# Patient Record
Sex: Female | Born: 1940 | Race: White | Hispanic: No | Smoking: Never smoker
Health system: Southern US, Community
[De-identification: ages and names within clinical notes are randomized; demographics above are authoritative.]

## PROBLEM LIST (undated history)

## (undated) DIAGNOSIS — G20A1 Parkinson's disease without dyskinesia, without mention of fluctuations: Secondary | ICD-10-CM

## (undated) DIAGNOSIS — E785 Hyperlipidemia, unspecified: Secondary | ICD-10-CM

## (undated) DIAGNOSIS — G20 Parkinson's disease: Secondary | ICD-10-CM

## (undated) DIAGNOSIS — I10 Essential (primary) hypertension: Secondary | ICD-10-CM

## (undated) DIAGNOSIS — N200 Calculus of kidney: Secondary | ICD-10-CM

## (undated) DIAGNOSIS — M1990 Unspecified osteoarthritis, unspecified site: Secondary | ICD-10-CM

## (undated) DIAGNOSIS — F419 Anxiety disorder, unspecified: Secondary | ICD-10-CM

## (undated) DIAGNOSIS — Z87442 Personal history of urinary calculi: Secondary | ICD-10-CM

## (undated) DIAGNOSIS — F32A Depression, unspecified: Secondary | ICD-10-CM

## (undated) DIAGNOSIS — H409 Unspecified glaucoma: Secondary | ICD-10-CM

## (undated) DIAGNOSIS — F329 Major depressive disorder, single episode, unspecified: Secondary | ICD-10-CM

## (undated) HISTORY — DX: Depression, unspecified: F32.A

## (undated) HISTORY — DX: Hyperlipidemia, unspecified: E78.5

## (undated) HISTORY — DX: Unspecified glaucoma: H40.9

## (undated) HISTORY — PX: ABDOMINAL HYSTERECTOMY: SHX81

## (undated) HISTORY — PX: CATARACT EXTRACTION, BILATERAL: SHX1313

## (undated) HISTORY — DX: Major depressive disorder, single episode, unspecified: F32.9

## (undated) HISTORY — PX: OTHER SURGICAL HISTORY: SHX169

## (undated) HISTORY — DX: Calculus of kidney: N20.0

## (undated) HISTORY — PX: ANTERIOR (CYSTOCELE) AND POSTERIOR REPAIR (RECTOCELE) WITH XENFORM GRAFT AND SACROSPINOUS FIXATION: SHX6492

## (undated) HISTORY — DX: Essential (primary) hypertension: I10

## (undated) HISTORY — PX: INGUINAL HERNIA REPAIR: SUR1180

## (undated) HISTORY — DX: Anxiety disorder, unspecified: F41.9

---

## 2005-04-19 ENCOUNTER — Encounter: Admission: RE | Admit: 2005-04-19 | Discharge: 2005-04-19 | Payer: Self-pay | Admitting: Internal Medicine

## 2006-10-21 ENCOUNTER — Ambulatory Visit (HOSPITAL_COMMUNITY): Admission: RE | Admit: 2006-10-21 | Discharge: 2006-10-21 | Payer: Self-pay | Admitting: Urology

## 2006-10-27 ENCOUNTER — Ambulatory Visit (HOSPITAL_COMMUNITY): Admission: RE | Admit: 2006-10-27 | Discharge: 2006-10-27 | Payer: Self-pay | Admitting: Urology

## 2007-04-02 ENCOUNTER — Ambulatory Visit (HOSPITAL_COMMUNITY): Admission: RE | Admit: 2007-04-02 | Discharge: 2007-04-02 | Payer: Self-pay | Admitting: Urology

## 2016-01-11 DIAGNOSIS — Z800 Family history of malignant neoplasm of digestive organs: Secondary | ICD-10-CM | POA: Diagnosis not present

## 2016-01-11 DIAGNOSIS — Z1231 Encounter for screening mammogram for malignant neoplasm of breast: Secondary | ICD-10-CM | POA: Diagnosis not present

## 2016-01-11 LAB — HM MAMMOGRAPHY

## 2016-01-17 DIAGNOSIS — L218 Other seborrheic dermatitis: Secondary | ICD-10-CM | POA: Diagnosis not present

## 2016-01-17 DIAGNOSIS — L570 Actinic keratosis: Secondary | ICD-10-CM | POA: Diagnosis not present

## 2016-01-30 DIAGNOSIS — Z1212 Encounter for screening for malignant neoplasm of rectum: Secondary | ICD-10-CM | POA: Diagnosis not present

## 2016-01-30 DIAGNOSIS — Z124 Encounter for screening for malignant neoplasm of cervix: Secondary | ICD-10-CM | POA: Diagnosis not present

## 2016-02-14 DIAGNOSIS — E2839 Other primary ovarian failure: Secondary | ICD-10-CM | POA: Diagnosis not present

## 2016-02-19 LAB — HEPATIC FUNCTION PANEL
ALT: 12 U/L (ref 7–35)
AST: 13 U/L (ref 13–35)
Alkaline Phosphatase: 70 U/L (ref 25–125)

## 2016-02-19 LAB — BASIC METABOLIC PANEL
BUN: 16 mg/dL (ref 4–21)
CREATININE: 0.7 mg/dL (ref 0.5–1.1)
GLUCOSE: 105 mg/dL
POTASSIUM: 4.2 mmol/L (ref 3.4–5.3)
Sodium: 141 mmol/L (ref 137–147)

## 2016-02-19 LAB — LIPID PANEL
CHOLESTEROL: 180 mg/dL (ref 0–200)
HDL: 54 mg/dL (ref 35–70)
LDL Cholesterol: 99 mg/dL
Triglycerides: 135 mg/dL (ref 40–160)

## 2016-02-29 DIAGNOSIS — K219 Gastro-esophageal reflux disease without esophagitis: Secondary | ICD-10-CM | POA: Diagnosis not present

## 2016-02-29 DIAGNOSIS — E785 Hyperlipidemia, unspecified: Secondary | ICD-10-CM | POA: Diagnosis not present

## 2016-02-29 DIAGNOSIS — I10 Essential (primary) hypertension: Secondary | ICD-10-CM | POA: Diagnosis not present

## 2016-03-05 DIAGNOSIS — E785 Hyperlipidemia, unspecified: Secondary | ICD-10-CM | POA: Diagnosis not present

## 2016-03-05 DIAGNOSIS — M8580 Other specified disorders of bone density and structure, unspecified site: Secondary | ICD-10-CM | POA: Diagnosis not present

## 2016-03-05 DIAGNOSIS — I10 Essential (primary) hypertension: Secondary | ICD-10-CM | POA: Diagnosis not present

## 2016-03-05 DIAGNOSIS — F329 Major depressive disorder, single episode, unspecified: Secondary | ICD-10-CM | POA: Diagnosis not present

## 2016-03-11 DIAGNOSIS — L821 Other seborrheic keratosis: Secondary | ICD-10-CM | POA: Diagnosis not present

## 2016-03-11 DIAGNOSIS — L570 Actinic keratosis: Secondary | ICD-10-CM | POA: Diagnosis not present

## 2016-03-11 DIAGNOSIS — L814 Other melanin hyperpigmentation: Secondary | ICD-10-CM | POA: Diagnosis not present

## 2016-03-11 DIAGNOSIS — D229 Melanocytic nevi, unspecified: Secondary | ICD-10-CM | POA: Diagnosis not present

## 2016-03-12 DIAGNOSIS — H43813 Vitreous degeneration, bilateral: Secondary | ICD-10-CM | POA: Diagnosis not present

## 2016-03-12 DIAGNOSIS — H401134 Primary open-angle glaucoma, bilateral, indeterminate stage: Secondary | ICD-10-CM | POA: Diagnosis not present

## 2016-03-12 DIAGNOSIS — Z961 Presence of intraocular lens: Secondary | ICD-10-CM | POA: Diagnosis not present

## 2016-03-12 DIAGNOSIS — H1013 Acute atopic conjunctivitis, bilateral: Secondary | ICD-10-CM | POA: Diagnosis not present

## 2016-09-03 DIAGNOSIS — H401134 Primary open-angle glaucoma, bilateral, indeterminate stage: Secondary | ICD-10-CM | POA: Diagnosis not present

## 2016-09-03 DIAGNOSIS — Z961 Presence of intraocular lens: Secondary | ICD-10-CM | POA: Diagnosis not present

## 2016-09-03 DIAGNOSIS — H43813 Vitreous degeneration, bilateral: Secondary | ICD-10-CM | POA: Diagnosis not present

## 2016-09-03 DIAGNOSIS — H1013 Acute atopic conjunctivitis, bilateral: Secondary | ICD-10-CM | POA: Diagnosis not present

## 2016-09-16 DIAGNOSIS — M722 Plantar fascial fibromatosis: Secondary | ICD-10-CM | POA: Diagnosis not present

## 2016-09-16 DIAGNOSIS — M7751 Other enthesopathy of right foot: Secondary | ICD-10-CM | POA: Diagnosis not present

## 2016-09-17 DIAGNOSIS — Z23 Encounter for immunization: Secondary | ICD-10-CM | POA: Diagnosis not present

## 2016-09-17 DIAGNOSIS — H401134 Primary open-angle glaucoma, bilateral, indeterminate stage: Secondary | ICD-10-CM | POA: Diagnosis not present

## 2016-09-17 DIAGNOSIS — H43813 Vitreous degeneration, bilateral: Secondary | ICD-10-CM | POA: Diagnosis not present

## 2016-09-17 DIAGNOSIS — Z961 Presence of intraocular lens: Secondary | ICD-10-CM | POA: Diagnosis not present

## 2016-09-17 DIAGNOSIS — H1013 Acute atopic conjunctivitis, bilateral: Secondary | ICD-10-CM | POA: Diagnosis not present

## 2016-09-25 DIAGNOSIS — I10 Essential (primary) hypertension: Secondary | ICD-10-CM | POA: Diagnosis not present

## 2016-09-25 DIAGNOSIS — E785 Hyperlipidemia, unspecified: Secondary | ICD-10-CM | POA: Diagnosis not present

## 2016-09-25 DIAGNOSIS — K219 Gastro-esophageal reflux disease without esophagitis: Secondary | ICD-10-CM | POA: Diagnosis not present

## 2016-09-25 LAB — CBC AND DIFFERENTIAL
HCT: 42 % (ref 36–46)
Hemoglobin: 13.7 g/dL (ref 12.0–16.0)
PLATELETS: 326 10*3/uL (ref 150–399)
WBC: 6.6 10*3/mL

## 2016-09-25 LAB — BASIC METABOLIC PANEL
BUN: 15 mg/dL (ref 4–21)
CREATININE: 0.7 mg/dL (ref 0.5–1.1)
Glucose: 109 mg/dL
POTASSIUM: 4.3 mmol/L (ref 3.4–5.3)
Sodium: 142 mmol/L (ref 137–147)

## 2016-09-25 LAB — LIPID PANEL
Cholesterol: 181 mg/dL (ref 0–200)
HDL: 52 mg/dL (ref 35–70)
LDL Cholesterol: 102 mg/dL
Triglycerides: 137 mg/dL (ref 40–160)

## 2016-09-25 LAB — HEPATIC FUNCTION PANEL
ALK PHOS: 68 U/L (ref 25–125)
ALT: 9 U/L (ref 7–35)
AST: 11 U/L — AB (ref 13–35)
BILIRUBIN, TOTAL: 0.4 mg/dL

## 2016-09-27 DIAGNOSIS — E785 Hyperlipidemia, unspecified: Secondary | ICD-10-CM | POA: Diagnosis not present

## 2016-09-27 DIAGNOSIS — I10 Essential (primary) hypertension: Secondary | ICD-10-CM | POA: Diagnosis not present

## 2016-09-27 DIAGNOSIS — F329 Major depressive disorder, single episode, unspecified: Secondary | ICD-10-CM | POA: Diagnosis not present

## 2016-09-27 DIAGNOSIS — G4700 Insomnia, unspecified: Secondary | ICD-10-CM | POA: Diagnosis not present

## 2016-09-30 DIAGNOSIS — M7732 Calcaneal spur, left foot: Secondary | ICD-10-CM | POA: Diagnosis not present

## 2016-09-30 DIAGNOSIS — M7752 Other enthesopathy of left foot: Secondary | ICD-10-CM | POA: Diagnosis not present

## 2016-09-30 DIAGNOSIS — M722 Plantar fascial fibromatosis: Secondary | ICD-10-CM | POA: Diagnosis not present

## 2016-10-25 DIAGNOSIS — H1013 Acute atopic conjunctivitis, bilateral: Secondary | ICD-10-CM | POA: Diagnosis not present

## 2016-10-25 DIAGNOSIS — H401134 Primary open-angle glaucoma, bilateral, indeterminate stage: Secondary | ICD-10-CM | POA: Diagnosis not present

## 2016-10-25 DIAGNOSIS — H01114 Allergic dermatitis of left upper eyelid: Secondary | ICD-10-CM | POA: Diagnosis not present

## 2016-10-25 DIAGNOSIS — H43813 Vitreous degeneration, bilateral: Secondary | ICD-10-CM | POA: Diagnosis not present

## 2016-10-25 DIAGNOSIS — H01111 Allergic dermatitis of right upper eyelid: Secondary | ICD-10-CM | POA: Diagnosis not present

## 2016-10-25 DIAGNOSIS — Z961 Presence of intraocular lens: Secondary | ICD-10-CM | POA: Diagnosis not present

## 2016-11-06 DIAGNOSIS — H43813 Vitreous degeneration, bilateral: Secondary | ICD-10-CM | POA: Diagnosis not present

## 2016-11-06 DIAGNOSIS — H401134 Primary open-angle glaucoma, bilateral, indeterminate stage: Secondary | ICD-10-CM | POA: Diagnosis not present

## 2016-11-06 DIAGNOSIS — Z961 Presence of intraocular lens: Secondary | ICD-10-CM | POA: Diagnosis not present

## 2016-11-06 DIAGNOSIS — H01111 Allergic dermatitis of right upper eyelid: Secondary | ICD-10-CM | POA: Diagnosis not present

## 2016-11-06 DIAGNOSIS — H1013 Acute atopic conjunctivitis, bilateral: Secondary | ICD-10-CM | POA: Diagnosis not present

## 2016-11-06 DIAGNOSIS — H01114 Allergic dermatitis of left upper eyelid: Secondary | ICD-10-CM | POA: Diagnosis not present

## 2017-01-16 DEATH — deceased

## 2017-01-30 DIAGNOSIS — M7062 Trochanteric bursitis, left hip: Secondary | ICD-10-CM | POA: Diagnosis not present

## 2017-02-24 DIAGNOSIS — H43813 Vitreous degeneration, bilateral: Secondary | ICD-10-CM | POA: Diagnosis not present

## 2017-02-24 DIAGNOSIS — H1013 Acute atopic conjunctivitis, bilateral: Secondary | ICD-10-CM | POA: Diagnosis not present

## 2017-02-24 DIAGNOSIS — H01114 Allergic dermatitis of left upper eyelid: Secondary | ICD-10-CM | POA: Diagnosis not present

## 2017-02-24 DIAGNOSIS — H401134 Primary open-angle glaucoma, bilateral, indeterminate stage: Secondary | ICD-10-CM | POA: Diagnosis not present

## 2017-02-24 DIAGNOSIS — H01111 Allergic dermatitis of right upper eyelid: Secondary | ICD-10-CM | POA: Diagnosis not present

## 2017-02-24 DIAGNOSIS — Z961 Presence of intraocular lens: Secondary | ICD-10-CM | POA: Diagnosis not present

## 2017-04-08 ENCOUNTER — Other Ambulatory Visit: Payer: Self-pay | Admitting: Physician Assistant

## 2017-04-08 ENCOUNTER — Encounter: Payer: Self-pay | Admitting: Physician Assistant

## 2017-04-08 ENCOUNTER — Ambulatory Visit (INDEPENDENT_AMBULATORY_CARE_PROVIDER_SITE_OTHER): Payer: Medicare Other

## 2017-04-08 ENCOUNTER — Ambulatory Visit (INDEPENDENT_AMBULATORY_CARE_PROVIDER_SITE_OTHER): Payer: Medicare Other | Admitting: Physician Assistant

## 2017-04-08 ENCOUNTER — Ambulatory Visit: Payer: Medicare Other

## 2017-04-08 VITALS — BP 126/80 | HR 72 | Ht 61.0 in | Wt 160.0 lb

## 2017-04-08 DIAGNOSIS — D369 Benign neoplasm, unspecified site: Secondary | ICD-10-CM | POA: Insufficient documentation

## 2017-04-08 DIAGNOSIS — I10 Essential (primary) hypertension: Secondary | ICD-10-CM | POA: Diagnosis not present

## 2017-04-08 DIAGNOSIS — M25552 Pain in left hip: Secondary | ICD-10-CM

## 2017-04-08 DIAGNOSIS — E7800 Pure hypercholesterolemia, unspecified: Secondary | ICD-10-CM | POA: Diagnosis not present

## 2017-04-08 DIAGNOSIS — Z8619 Personal history of other infectious and parasitic diseases: Secondary | ICD-10-CM | POA: Diagnosis not present

## 2017-04-08 DIAGNOSIS — Z1231 Encounter for screening mammogram for malignant neoplasm of breast: Secondary | ICD-10-CM | POA: Diagnosis not present

## 2017-04-08 DIAGNOSIS — M7062 Trochanteric bursitis, left hip: Secondary | ICD-10-CM

## 2017-04-08 DIAGNOSIS — F419 Anxiety disorder, unspecified: Secondary | ICD-10-CM | POA: Insufficient documentation

## 2017-04-08 DIAGNOSIS — Z87442 Personal history of urinary calculi: Secondary | ICD-10-CM | POA: Insufficient documentation

## 2017-04-08 DIAGNOSIS — F339 Major depressive disorder, recurrent, unspecified: Secondary | ICD-10-CM | POA: Diagnosis not present

## 2017-04-08 MED ORDER — LORAZEPAM 1 MG PO TABS: 1.0000 mg | ORAL_TABLET | Freq: Every day | ORAL | 5 refills | Status: DC

## 2017-04-08 MED ORDER — MELOXICAM 15 MG PO TABS: 15.0000 mg | ORAL_TABLET | Freq: Every day | ORAL | 1 refills | Status: DC

## 2017-04-08 MED ORDER — SIMVASTATIN 10 MG PO TABS: 10.0000 mg | ORAL_TABLET | Freq: Every day | ORAL | 3 refills | Status: DC

## 2017-04-08 NOTE — Patient Instructions (Signed)

## 2017-04-08 NOTE — Progress Notes (Signed)
Subjective:    Patient ID: Chelsey Estes, female    DOB: [birth year], 76 y.o.   MRN: [id number]  HPI  Pt is a 76 yo female who presents to the clinic to establish care.   .. Active Ambulatory Problems    Diagnosis Date Noted  . History of shingles 04/08/2017  . Adenomatous polyp 04/08/2017  . Pure hypercholesterolemia 04/08/2017  . History of kidney stones 04/08/2017  . Essential hypertension 04/08/2017  . Depression, recurrent ([place]) 04/08/2017  . Anxiety 04/08/2017  . Greater trochanteric bursitis of left hip 04/08/2017   Resolved Ambulatory Problems    Diagnosis Date Noted  . No Resolved Ambulatory Problems   Past Medical History:  Diagnosis Date  . Anxiety   . Depression   . Hyperlipidemia   . Hypertension    .Marland Kitchen Family History  Problem Relation Age of Onset  . Alcohol abuse Father   . Cancer Father     esophageal  . Stroke Maternal Grandfather   . Alcohol abuse Paternal Grandfather    .Marland Kitchen Social History   Social History  . Marital status: Married    Spouse name: N/A  . Number of children: N/A  . Years of education: N/A   Occupational History  . Not on file.   Social History Main Topics  . Smoking status: Never Smoker  . Smokeless tobacco: Never Used  . Alcohol use No  . Drug use: No  . Sexual activity: Not Currently   Other Topics Concern  . Not on file   Social History Narrative  . No narrative on file    Started in febuary shot didn't help at all. 4 ibuprofen upset stomach better but not well. Worse during the move.     Review of Systems  All other systems reviewed and are negative.      Objective:   Physical Exam  Constitutional: She is oriented to person, place, and time. She appears well-developed and well-nourished.  HENT:  Head: Normocephalic and atraumatic.  Eyes: Conjunctivae are normal.  Cardiovascular: Normal rate, regular rhythm and normal heart sounds.   Pulmonary/Chest: Effort normal and breath sounds normal.   Musculoskeletal:  Decreased ROM of left hip. Tenderness over greater trochanter to palpation.   Neurological: She is alert and oriented to person, place, and time.  Skin: Skin is dry.  Psychiatric: She has a normal mood and affect. Her behavior is normal.          Assessment & Plan:  Marland KitchenMarland KitchenElisa was seen today for establish care, hyperlipidemia, depression, hypertension and anxiety.  Diagnoses and all orders for this visit:  Greater trochanteric bursitis of left hip -     meloxicam (MOBIC) 15 MG tablet; Take 1 tablet (15 mg total) by mouth daily. -     DG HIP UNILAT WITH PELVIS 2-3 VIEWS LEFT; Future -     DG HIP UNILAT WITH PELVIS 2-3 VIEWS LEFT  Visit for screening mammogram -     MM SCREENING BREAST TOMO BILATERAL; Future -     MM SCREENING BREAST TOMO BILATERAL  History of shingles  Pure hypercholesterolemia -     simvastatin (ZOCOR) 10 MG tablet; Take 1 tablet (10 mg total) by mouth daily.  Adenomatous polyp  Essential hypertension  History of kidney stones  Depression, recurrent (HCC)  Anxiety -     LORazepam (ATIVAN) 1 MG tablet; Take 1 tablet (1 mg total) by mouth daily.  Left hip pain -     meloxicam (MOBIC) 15 MG  tablet; Take 1 tablet (15 mg total) by mouth daily. -     DG HIP UNILAT WITH PELVIS 2-3 VIEWS LEFT; Future -     DG HIP UNILAT WITH PELVIS 2-3 VIEWS LEFT  cologuard was signed and sent off today.   .. Depression screen [facility] 2/9 04/08/2017  Decreased Interest 1  Down, Depressed, Hopeless 1  PHQ - 2 Score 2  Altered sleeping 1  Tired, decreased energy 1  Change in appetite 2  Feeling bad or failure about yourself  0  Trouble concentrating 0  Moving slowly or fidgety/restless 0  Suicidal thoughts 0  PHQ-9 Score 6   Start mobic. Will get imaging of hip. Exercises given to start. REST.if not improving consider another injection by sports medicine next week or PT.

## 2017-04-08 NOTE — Progress Notes (Signed)
Call pt: slight narrowing of both hip joints but no acute findings. Continue with treatment plan discussed in office.

## 2017-04-10 ENCOUNTER — Encounter: Payer: Self-pay | Admitting: Physician Assistant

## 2017-04-10 DIAGNOSIS — M25552 Pain in left hip: Secondary | ICD-10-CM | POA: Insufficient documentation

## 2017-04-14 ENCOUNTER — Encounter: Payer: Self-pay | Admitting: Physician Assistant

## 2017-04-15 ENCOUNTER — Ambulatory Visit (INDEPENDENT_AMBULATORY_CARE_PROVIDER_SITE_OTHER): Payer: Medicare Other

## 2017-04-15 DIAGNOSIS — Z1231 Encounter for screening mammogram for malignant neoplasm of breast: Secondary | ICD-10-CM | POA: Diagnosis not present

## 2017-04-15 NOTE — Progress Notes (Signed)
Call pt: normal mammogram. Follow up in one year.

## 2017-04-22 ENCOUNTER — Encounter: Payer: Self-pay | Admitting: Physician Assistant

## 2017-05-23 ENCOUNTER — Encounter: Payer: Self-pay | Admitting: Physician Assistant

## 2017-06-30 ENCOUNTER — Telehealth: Payer: Self-pay

## 2017-06-30 ENCOUNTER — Other Ambulatory Visit: Payer: Self-pay

## 2017-06-30 DIAGNOSIS — I10 Essential (primary) hypertension: Secondary | ICD-10-CM

## 2017-06-30 DIAGNOSIS — Z87442 Personal history of urinary calculi: Secondary | ICD-10-CM

## 2017-06-30 DIAGNOSIS — F339 Major depressive disorder, recurrent, unspecified: Secondary | ICD-10-CM

## 2017-06-30 MED ORDER — AMLODIPINE BESY-BENAZEPRIL HCL 5-10 MG PO CAPS: 1.0000 | ORAL_CAPSULE | Freq: Every day | ORAL | 1 refills | Status: DC

## 2017-06-30 MED ORDER — BUPROPION HCL ER (XL) 300 MG PO TB24: 300.0000 mg | ORAL_TABLET | Freq: Every day | ORAL | 0 refills | Status: DC

## 2017-06-30 NOTE — Telephone Encounter (Signed)
Awaiting a call back.    I called and left a message asking why she was wanting a referral to Urology. She had left a written message stating she needs a referral.

## 2017-06-30 NOTE — Telephone Encounter (Signed)
Spoke with pt she usually sees Dr. Jeffie Pollock once a year to have x-ray because she has a h/o kidney stones.  She is wanting to see someone close to home. -EH/RMA

## 2017-07-02 NOTE — Telephone Encounter (Signed)
Referral sent 

## 2017-07-26 ENCOUNTER — Other Ambulatory Visit: Payer: Self-pay | Admitting: Physician Assistant

## 2017-07-26 DIAGNOSIS — M7062 Trochanteric bursitis, left hip: Secondary | ICD-10-CM

## 2017-07-26 DIAGNOSIS — M25552 Pain in left hip: Secondary | ICD-10-CM

## 2017-10-06 ENCOUNTER — Other Ambulatory Visit: Payer: Self-pay | Admitting: Physician Assistant

## 2017-10-06 DIAGNOSIS — Z1329 Encounter for screening for other suspected endocrine disorder: Secondary | ICD-10-CM

## 2017-10-06 DIAGNOSIS — Z0000 Encounter for general adult medical examination without abnormal findings: Secondary | ICD-10-CM

## 2017-10-06 DIAGNOSIS — I10 Essential (primary) hypertension: Secondary | ICD-10-CM

## 2017-10-06 DIAGNOSIS — E7800 Pure hypercholesterolemia, unspecified: Secondary | ICD-10-CM

## 2017-10-06 DIAGNOSIS — Z87442 Personal history of urinary calculi: Secondary | ICD-10-CM

## 2017-10-06 NOTE — Progress Notes (Signed)
Labs ordered prior to Pt's appt per Pt request

## 2017-10-07 DIAGNOSIS — Z1329 Encounter for screening for other suspected endocrine disorder: Secondary | ICD-10-CM | POA: Diagnosis not present

## 2017-10-07 DIAGNOSIS — E7800 Pure hypercholesterolemia, unspecified: Secondary | ICD-10-CM | POA: Diagnosis not present

## 2017-10-07 DIAGNOSIS — Z0000 Encounter for general adult medical examination without abnormal findings: Secondary | ICD-10-CM | POA: Diagnosis not present

## 2017-10-07 DIAGNOSIS — I10 Essential (primary) hypertension: Secondary | ICD-10-CM | POA: Diagnosis not present

## 2017-10-07 DIAGNOSIS — Z87442 Personal history of urinary calculi: Secondary | ICD-10-CM | POA: Diagnosis not present

## 2017-10-08 ENCOUNTER — Ambulatory Visit (INDEPENDENT_AMBULATORY_CARE_PROVIDER_SITE_OTHER): Payer: Medicare Other | Admitting: Physician Assistant

## 2017-10-08 ENCOUNTER — Encounter: Payer: Self-pay | Admitting: Physician Assistant

## 2017-10-08 VITALS — BP 133/63 | HR 63 | Ht 60.98 in | Wt 160.0 lb

## 2017-10-08 DIAGNOSIS — R8271 Bacteriuria: Secondary | ICD-10-CM | POA: Diagnosis not present

## 2017-10-08 DIAGNOSIS — E7800 Pure hypercholesterolemia, unspecified: Secondary | ICD-10-CM | POA: Diagnosis not present

## 2017-10-08 DIAGNOSIS — Z87442 Personal history of urinary calculi: Secondary | ICD-10-CM

## 2017-10-08 DIAGNOSIS — R350 Frequency of micturition: Secondary | ICD-10-CM

## 2017-10-08 DIAGNOSIS — I10 Essential (primary) hypertension: Secondary | ICD-10-CM

## 2017-10-08 DIAGNOSIS — Z23 Encounter for immunization: Secondary | ICD-10-CM | POA: Diagnosis not present

## 2017-10-08 DIAGNOSIS — F339 Major depressive disorder, recurrent, unspecified: Secondary | ICD-10-CM

## 2017-10-08 DIAGNOSIS — F419 Anxiety disorder, unspecified: Secondary | ICD-10-CM | POA: Diagnosis not present

## 2017-10-08 LAB — COMPLETE METABOLIC PANEL WITH GFR
AG Ratio: 1.7 (calc) (ref 1.0–2.5)
ALKALINE PHOSPHATASE (APISO): 65 U/L (ref 33–130)
ALT: 10 U/L (ref 6–29)
AST: 11 U/L (ref 10–35)
Albumin: 4 g/dL (ref 3.6–5.1)
BUN: 14 mg/dL (ref 7–25)
CALCIUM: 9.6 mg/dL (ref 8.6–10.4)
CO2: 26 mmol/L (ref 20–32)
CREATININE: 0.76 mg/dL (ref 0.60–0.93)
Chloride: 107 mmol/L (ref 98–110)
GFR, EST NON AFRICAN AMERICAN: 76 mL/min/{1.73_m2} (ref >=60)
GFR, Est African American: 88 mL/min/{1.73_m2} (ref >=60)
GLOBULIN: 2.3 g/dL (ref 1.9–3.7)
GLUCOSE: 99 mg/dL (ref 65–99)
Potassium: 4.1 mmol/L (ref 3.5–5.3)
Sodium: 141 mmol/L (ref 135–146)
Total Bilirubin: 0.4 mg/dL (ref 0.2–1.2)
Total Protein: 6.3 g/dL (ref 6.1–8.1)

## 2017-10-08 LAB — CBC WITH DIFFERENTIAL/PLATELET
Basophils Absolute: 71 cells/uL (ref 0–200)
Basophils Relative: 1 %
EOS PCT: 3 %
Eosinophils Absolute: 213 cells/uL (ref 15–500)
HEMATOCRIT: 42.6 % (ref 35.0–45.0)
Hemoglobin: 14 g/dL (ref 11.7–15.5)
LYMPHS ABS: 2102 {cells}/uL (ref 850–3900)
MCH: 28.7 pg (ref 27.0–33.0)
MCHC: 32.9 g/dL (ref 32.0–36.0)
MCV: 87.5 fL (ref 80.0–100.0)
MONOS PCT: 8.7 %
MPV: 10.9 fL (ref 7.5–12.5)
NEUTROS PCT: 57.7 %
Neutro Abs: 4097 cells/uL (ref 1500–7800)
PLATELETS: 333 10*3/uL (ref 140–400)
RBC: 4.87 10*6/uL (ref 3.80–5.10)
RDW: 12.3 % (ref 11.0–15.0)
Total Lymphocyte: 29.6 %
WBC mixed population: 618 cells/uL (ref 200–950)
WBC: 7.1 10*3/uL (ref 3.8–10.8)

## 2017-10-08 LAB — URINALYSIS, ROUTINE W REFLEX MICROSCOPIC
Bilirubin Urine: NEGATIVE
Glucose, UA: NEGATIVE
HGB URINE DIPSTICK: NEGATIVE
Ketones, ur: NEGATIVE
Nitrite: NEGATIVE
PROTEIN: NEGATIVE
SQUAMOUS EPITHELIAL / LPF: NONE SEEN /HPF (ref <=5)
Specific Gravity, Urine: 1.03 (ref 1.001–1.03)
pH: 6.5 (ref 5.0–8.0)

## 2017-10-08 LAB — TSH: TSH: 1.48 mIU/L (ref 0.40–4.50)

## 2017-10-08 LAB — LIPID PANEL
CHOL/HDL RATIO: 3.4 (calc) (ref <5.0)
CHOLESTEROL: 176 mg/dL (ref <200)
HDL: 52 mg/dL (ref >50)
LDL CHOLESTEROL (CALC): 98 mg/dL
NON-HDL CHOLESTEROL (CALC): 124 mg/dL (ref <130)
TRIGLYCERIDES: 166 mg/dL — AB (ref <150)

## 2017-10-08 LAB — MICROSCOPIC MESSAGE

## 2017-10-08 MED ORDER — SIMVASTATIN 10 MG PO TABS: 10.0000 mg | ORAL_TABLET | Freq: Every day | ORAL | 3 refills | Status: DC

## 2017-10-08 MED ORDER — BUPROPION HCL ER (XL) 300 MG PO TB24: 300.0000 mg | ORAL_TABLET | Freq: Every day | ORAL | 0 refills | Status: DC

## 2017-10-08 MED ORDER — LORAZEPAM 1 MG PO TABS: 1.0000 mg | ORAL_TABLET | Freq: Every day | ORAL | 5 refills | Status: DC

## 2017-10-08 MED ORDER — NITROFURANTOIN MONOHYD MACRO 100 MG PO CAPS: 100.0000 mg | ORAL_CAPSULE | Freq: Two times a day (BID) | ORAL | 0 refills | Status: DC

## 2017-10-08 MED ORDER — AMLODIPINE BESY-BENAZEPRIL HCL 5-10 MG PO CAPS
1.0000 | ORAL_CAPSULE | Freq: Every day | ORAL | 1 refills | Status: DC
Start: 2017-10-08 — End: 2018-03-20

## 2017-10-08 NOTE — Progress Notes (Signed)
Subjective:    Patient ID: Chelsey Estes, female    DOB: Apr 17, 1941, 76 y.o.   MRN: [id number]  HPI  Pt is a 76 yo pleasant female who presents to the clinic for medication refills.   .. Active Ambulatory Problems    Diagnosis Date Noted  . History of shingles 04/08/2017  . Adenomatous polyp 04/08/2017  . Pure hypercholesterolemia 04/08/2017  . History of kidney stones 04/08/2017  . Essential hypertension 04/08/2017  . Depression, recurrent ([place]) 04/08/2017  . Anxiety 04/08/2017  . Greater trochanteric bursitis of left hip 04/08/2017  . Left hip pain 04/10/2017   Resolved Ambulatory Problems    Diagnosis Date Noted  . No Resolved Ambulatory Problems   Past Medical History:  Diagnosis Date  . Anxiety   . Depression   . Hyperlipidemia   . Hypertension    HTN- no concerns. Taking medication daily. Denies any CP, palpitations, SOB, headache, or vision changes.   Depression/anxiety- well controlled with medication.   Pt has hx of kidney stones and still has not heard from referral in July. She got a UA for frequency issues and bacteria was found. She has been having symptoms for the last 2 weeks. No fever, chills, n/v. No flank pain or abdominal pain.      Review of Systems  All other systems reviewed and are negative.      Objective:   Physical Exam  Constitutional: She is oriented to person, place, and time. She appears well-developed and well-nourished.  HENT:  Head: Normocephalic and atraumatic.  Cardiovascular: Normal rate, regular rhythm and normal heart sounds.   Pulmonary/Chest: Effort normal and breath sounds normal. She has no wheezes.  No CVA tenderness.   Abdominal: Soft. Bowel sounds are normal. She exhibits no distension and no mass. There is no tenderness. There is no rebound and no guarding.  Neurological: She is alert and oriented to person, place, and time.  Psychiatric: She has a normal mood and affect. Her behavior is normal.           Assessment & Plan:  Marland KitchenMarland KitchenZoee was seen today for follow-up.  Diagnoses and all orders for this visit:  Essential hypertension -     amLODipine-benazepril (LOTREL) 5-10 MG capsule; Take 1 capsule by mouth daily.  Anxiety -     LORazepam (ATIVAN) 1 MG tablet; Take 1 tablet (1 mg total) by mouth daily.  Depression, recurrent (HCC) -     buPROPion (WELLBUTRIN XL) 300 MG 24 hr tablet; Take 1 tablet (300 mg total) by mouth daily.  Pure hypercholesterolemia -     simvastatin (ZOCOR) 10 MG tablet; Take 1 tablet (10 mg total) by mouth daily.  Need for Tdap vaccination -     Tdap vaccine greater than or equal to 7yo IM  Urinary frequency -     nitrofurantoin, macrocrystal-monohydrate, (MACROBID) 100 MG capsule; Take 1 capsule (100 mg total) by mouth 2 (two) times daily. For 7 days.  History of kidney stones  Bacteria in urine -     nitrofurantoin, macrocrystal-monohydrate, (MACROBID) 100 MG capsule; Take 1 capsule (100 mg total) by mouth 2 (two) times daily. For 7 days.   Medication refills given. BP controlled. Fasting labs just updated and look good.   UA did show bacteria in urine and with given symptoms treated with macrobid for 7 days come in to check for clearance. Printed referral and advised to call.   Discussed pneumonia vaccines she feels like got it in [place] at a  pharmacy. We see if she can find proof.

## 2017-10-08 NOTE — Patient Instructions (Signed)

## 2017-10-20 ENCOUNTER — Ambulatory Visit (INDEPENDENT_AMBULATORY_CARE_PROVIDER_SITE_OTHER): Payer: Medicare Other | Admitting: Family Medicine

## 2017-10-20 VITALS — BP 135/57 | HR 68 | Ht 62.6 in | Wt 160.0 lb

## 2017-10-20 DIAGNOSIS — N3000 Acute cystitis without hematuria: Secondary | ICD-10-CM

## 2017-10-20 DIAGNOSIS — R350 Frequency of micturition: Secondary | ICD-10-CM

## 2017-10-20 NOTE — Progress Notes (Signed)
Pt in today to make sure Macrobid treated the bacteria in her urine from last visit.  Pt is asymptomatic today.  Urine culture ordered.

## 2017-10-20 NOTE — Progress Notes (Signed)
Urinary tract infection-completed Macrobid now asymptomatic.  We will send repeat culture today to confirm clearance of the infection.  Beatrice Lecher, MD

## 2017-10-22 LAB — URINE CULTURE
MICRO NUMBER:: 81240229
SPECIMEN QUALITY:: ADEQUATE

## 2017-10-22 NOTE — Progress Notes (Signed)
Call Pt: likely some external contamination no active infection.

## 2017-11-17 ENCOUNTER — Telehealth: Payer: Self-pay | Admitting: Physician Assistant

## 2017-11-17 NOTE — Telephone Encounter (Signed)
Pt called into the office and stated her amlodipine has been recalled by the manufacturer.  Patients needs to know what she needs to do.

## 2017-11-17 NOTE — Telephone Encounter (Signed)
Can we call pharmacy and see if they are going to get norvasc through a different manufactor or do we need to change medication completely. Confirm patients is a part of recall.

## 2017-11-18 NOTE — Telephone Encounter (Signed)
Spoke with pt and advised her to contact her pharmacy for recall information on her medication.

## 2017-12-26 ENCOUNTER — Other Ambulatory Visit: Payer: Self-pay | Admitting: Physician Assistant

## 2017-12-26 DIAGNOSIS — F339 Major depressive disorder, recurrent, unspecified: Secondary | ICD-10-CM

## 2018-02-10 DIAGNOSIS — H1045 Other chronic allergic conjunctivitis: Secondary | ICD-10-CM | POA: Diagnosis not present

## 2018-02-15 ENCOUNTER — Other Ambulatory Visit: Payer: Self-pay | Admitting: Physician Assistant

## 2018-02-15 DIAGNOSIS — F339 Major depressive disorder, recurrent, unspecified: Secondary | ICD-10-CM

## 2018-03-13 ENCOUNTER — Other Ambulatory Visit: Payer: Self-pay | Admitting: Physician Assistant

## 2018-03-13 ENCOUNTER — Other Ambulatory Visit: Payer: Self-pay | Admitting: Family Medicine

## 2018-03-13 DIAGNOSIS — I10 Essential (primary) hypertension: Secondary | ICD-10-CM

## 2018-03-13 DIAGNOSIS — F339 Major depressive disorder, recurrent, unspecified: Secondary | ICD-10-CM

## 2018-03-16 ENCOUNTER — Other Ambulatory Visit: Payer: Self-pay | Admitting: Physician Assistant

## 2018-03-16 DIAGNOSIS — Z1231 Encounter for screening mammogram for malignant neoplasm of breast: Secondary | ICD-10-CM

## 2018-03-19 ENCOUNTER — Other Ambulatory Visit: Payer: Self-pay | Admitting: Physician Assistant

## 2018-03-19 DIAGNOSIS — F339 Major depressive disorder, recurrent, unspecified: Secondary | ICD-10-CM

## 2018-03-19 MED ORDER — BUPROPION HCL ER (XL) 300 MG PO TB24: 300.0000 mg | ORAL_TABLET | Freq: Every day | ORAL | 0 refills | Status: DC

## 2018-03-20 ENCOUNTER — Other Ambulatory Visit: Payer: Self-pay | Admitting: *Deleted

## 2018-03-23 ENCOUNTER — Other Ambulatory Visit: Payer: Self-pay

## 2018-03-23 DIAGNOSIS — I10 Essential (primary) hypertension: Secondary | ICD-10-CM

## 2018-03-25 DIAGNOSIS — I10 Essential (primary) hypertension: Secondary | ICD-10-CM | POA: Diagnosis not present

## 2018-03-25 LAB — COMPLETE METABOLIC PANEL WITH GFR
AG Ratio: 1.9 (calc) (ref 1.0–2.5)
ALBUMIN MSPROF: 4.2 g/dL (ref 3.6–5.1)
ALKALINE PHOSPHATASE (APISO): 78 U/L (ref 33–130)
ALT: 11 U/L (ref 6–29)
AST: 13 U/L (ref 10–35)
BILIRUBIN TOTAL: 0.3 mg/dL (ref 0.2–1.2)
BUN: 16 mg/dL (ref 7–25)
CHLORIDE: 105 mmol/L (ref 98–110)
CO2: 30 mmol/L (ref 20–32)
Calcium: 10 mg/dL (ref 8.6–10.4)
Creat: 0.8 mg/dL (ref 0.60–0.93)
GFR, Est African American: 82 mL/min/{1.73_m2} (ref >=60)
GFR, Est Non African American: 71 mL/min/{1.73_m2} (ref >=60)
GLUCOSE: 100 mg/dL (ref 65–139)
Globulin: 2.2 g/dL (calc) (ref 1.9–3.7)
Potassium: 4.7 mmol/L (ref 3.5–5.3)
SODIUM: 140 mmol/L (ref 135–146)
Total Protein: 6.4 g/dL (ref 6.1–8.1)

## 2018-03-25 LAB — CBC WITH DIFFERENTIAL/PLATELET
BASOS ABS: 80 {cells}/uL (ref 0–200)
Basophils Relative: 1 %
EOS ABS: 240 {cells}/uL (ref 15–500)
Eosinophils Relative: 3 %
HCT: 41.8 % (ref 35.0–45.0)
HEMOGLOBIN: 14 g/dL (ref 11.7–15.5)
Lymphs Abs: 2608 cells/uL (ref 850–3900)
MCH: 29.3 pg (ref 27.0–33.0)
MCHC: 33.5 g/dL (ref 32.0–36.0)
MCV: 87.4 fL (ref 80.0–100.0)
MONOS PCT: 8 %
MPV: 10.9 fL (ref 7.5–12.5)
NEUTROS PCT: 55.4 %
Neutro Abs: 4432 cells/uL (ref 1500–7800)
Platelets: 347 10*3/uL (ref 140–400)
RBC: 4.78 10*6/uL (ref 3.80–5.10)
RDW: 12.4 % (ref 11.0–15.0)
TOTAL LYMPHOCYTE: 32.6 %
WBC mixed population: 640 cells/uL (ref 200–950)
WBC: 8 10*3/uL (ref 3.8–10.8)

## 2018-03-26 ENCOUNTER — Ambulatory Visit (INDEPENDENT_AMBULATORY_CARE_PROVIDER_SITE_OTHER): Payer: Medicare Other | Admitting: Physician Assistant

## 2018-03-26 ENCOUNTER — Encounter: Payer: Self-pay | Admitting: Physician Assistant

## 2018-03-26 VITALS — BP 113/70 | HR 68 | Ht 62.6 in | Wt 153.0 lb

## 2018-03-26 DIAGNOSIS — F419 Anxiety disorder, unspecified: Secondary | ICD-10-CM

## 2018-03-26 DIAGNOSIS — I10 Essential (primary) hypertension: Secondary | ICD-10-CM | POA: Diagnosis not present

## 2018-03-26 DIAGNOSIS — F339 Major depressive disorder, recurrent, unspecified: Secondary | ICD-10-CM

## 2018-03-26 MED ORDER — AMLODIPINE BESY-BENAZEPRIL HCL 5-10 MG PO CAPS: 1.0000 | ORAL_CAPSULE | Freq: Every day | ORAL | 1 refills | Status: DC

## 2018-03-26 MED ORDER — BUPROPION HCL ER (XL) 300 MG PO TB24
ORAL_TABLET | ORAL | 1 refills | Status: DC
Start: 2018-03-26 — End: 2018-09-25
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
## 2018-03-26 NOTE — Progress Notes (Signed)
Call pt: labs look great.

## 2018-04-17 ENCOUNTER — Ambulatory Visit (INDEPENDENT_AMBULATORY_CARE_PROVIDER_SITE_OTHER): Payer: Medicare Other

## 2018-04-17 DIAGNOSIS — Z1231 Encounter for screening mammogram for malignant neoplasm of breast: Secondary | ICD-10-CM

## 2018-04-17 NOTE — Progress Notes (Signed)
Call pt: normal mammogram.

## 2018-04-30 DIAGNOSIS — R109 Unspecified abdominal pain: Secondary | ICD-10-CM | POA: Diagnosis not present

## 2018-04-30 DIAGNOSIS — Z87442 Personal history of urinary calculi: Secondary | ICD-10-CM | POA: Diagnosis not present

## 2018-05-05 DIAGNOSIS — R109 Unspecified abdominal pain: Secondary | ICD-10-CM | POA: Diagnosis not present

## 2018-05-05 DIAGNOSIS — K449 Diaphragmatic hernia without obstruction or gangrene: Secondary | ICD-10-CM | POA: Diagnosis not present

## 2018-05-05 DIAGNOSIS — K5730 Diverticulosis of large intestine without perforation or abscess without bleeding: Secondary | ICD-10-CM | POA: Diagnosis not present

## 2018-05-05 DIAGNOSIS — K8020 Calculus of gallbladder without cholecystitis without obstruction: Secondary | ICD-10-CM | POA: Diagnosis not present

## 2018-05-05 DIAGNOSIS — N200 Calculus of kidney: Secondary | ICD-10-CM | POA: Diagnosis not present

## 2018-05-19 ENCOUNTER — Other Ambulatory Visit: Payer: Self-pay | Admitting: Physician Assistant

## 2018-05-19 DIAGNOSIS — F419 Anxiety disorder, unspecified: Secondary | ICD-10-CM

## 2018-06-30 ENCOUNTER — Other Ambulatory Visit: Payer: Self-pay | Admitting: Physician Assistant

## 2018-06-30 DIAGNOSIS — F419 Anxiety disorder, unspecified: Secondary | ICD-10-CM

## 2018-06-30 NOTE — Telephone Encounter (Signed)
[facility] requesting RF on Lorazepam  Last RX written 05-19-18 for #30 no RF   RX pended, please send if appropriate

## 2018-08-20 ENCOUNTER — Ambulatory Visit (INDEPENDENT_AMBULATORY_CARE_PROVIDER_SITE_OTHER): Payer: Medicare Other

## 2018-08-20 ENCOUNTER — Ambulatory Visit (INDEPENDENT_AMBULATORY_CARE_PROVIDER_SITE_OTHER): Payer: Medicare Other | Admitting: Sports Medicine

## 2018-08-20 ENCOUNTER — Encounter: Payer: Self-pay | Admitting: Sports Medicine

## 2018-08-20 DIAGNOSIS — M79672 Pain in left foot: Secondary | ICD-10-CM

## 2018-08-20 DIAGNOSIS — G8929 Other chronic pain: Secondary | ICD-10-CM | POA: Insufficient documentation

## 2018-08-20 DIAGNOSIS — M79671 Pain in right foot: Secondary | ICD-10-CM | POA: Diagnosis not present

## 2018-08-20 DIAGNOSIS — M7742 Metatarsalgia, left foot: Secondary | ICD-10-CM | POA: Diagnosis not present

## 2018-08-20 NOTE — Progress Notes (Signed)
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  She had a Morton's neuroma excision about 20 years ago.  Unfortunately she started to have a new onset pain on the plantar aspect of her second left MTP.  Worse with weightbearing.  Moderate, persistent without radiation.  In addition she has had chronic right foot pain, dorsal lateral midfoot.  I reviewed the past medical history, family history, social history, surgical history, and allergies today and no changes were needed.  Please see the problem list section below in epic for further details.  Past Medical History: Past Medical History:  Diagnosis Date  . Anxiety   . Depression   . Hyperlipidemia   . Hypertension    Past Surgical History: Past Surgical History:  Procedure Laterality Date  . ABDOMINAL HYSTERECTOMY    . ANTERIOR (CYSTOCELE) AND POSTERIOR REPAIR (RECTOCELE) WITH XENFORM GRAFT AND SACROSPINOUS FIXATION     Social History: Social History   Socioeconomic History  . Marital status: Married    Spouse name: Not on file  . Number of children: Not on file  . Years of education: Not on file  . Highest education level: Not on file  Occupational History  . Not on file  Social Needs  . Financial resource strain: Not on file  . Food insecurity:    Worry: Not on file    Inability: Not on file  . Transportation needs:    Medical: Not on file    Non-medical: Not on file  Tobacco Use  . Smoking status: Never Smoker  . Smokeless tobacco: Never Used  Substance and Sexual Activity  . Alcohol use: No  . Drug use: No  . Sexual activity: Not Currently  Lifestyle  . Physical activity:    Days per week: Not on file    Minutes per session: Not on file  . Stress: Not on file  Relationships  . Social connections:    Talks on phone: Not on file    Gets together: Not on file    Attends religious service: Not on file   Active member of club or organization: Not on file    Attends meetings of clubs or organizations: Not on file    Relationship status: Not on file  Other Topics Concern  . Not on file  Social History Narrative  . Not on file   Family History: Family History  Problem Relation Age of Onset  . Alcohol abuse Father   . Cancer Father        esophageal  . Stroke Maternal Grandfather   . Alcohol abuse Paternal Grandfather    Allergies: No Known Allergies Medications: See med rec.  Review of Systems: No fevers, chills, night sweats, weight loss, chest pain, or shortness of breath.   Objective:    General: Well Developed, well nourished, and in no acute distress.  Neuro: Alert and oriented x3, extra-ocular muscles intact, sensation grossly intact.  HEENT: Normocephalic, atraumatic, pupils equal round reactive to light, neck supple, no masses, no lymphadenopathy, thyroid nonpalpable.  Skin: Warm and dry, no rashes. Cardiac: Regular rate and rhythm, no murmurs rubs or gallops, no lower extremity edema.  Respiratory: Clear to auscultation bilaterally. Not using accessory muscles, speaking in full sentences. Right foot: Minimal tenderness over the dorsal lateral midfoot Range of motion is full in all directions. Strength is 5/5 in all directions. No hallux valgus.  Pes cavus No abnormal callus noted. No pain over the navicular prominence, or base of fifth metatarsal. No tenderness to palpation of the calcaneal insertion of plantar fascia. No pain at the Achilles insertion. No pain over the calcaneal bursa. No pain of the retrocalcaneal bursa. No tenderness to palpation over the tarsals, metatarsals, or phalanges. No hallux rigidus or limitus. No tenderness palpation over interphalangeal joints. No pain with compression of the metatarsal heads. Neurovascularly intact distally. Left foot:  Foot inspection and palpation reveals breakdown of the transverse arch and a drop of MT  heads. Tender to palpation of the plantar left second MTP. Abnormal callus is present between the second, third, and fourth metatarsal heads. Hammer toes are present.  Procedure: Real-time Ultrasound Guided Injection of left second MTP Device: GE Logiq E  Verbal informed consent obtained.  Time-out conducted.  Noted no overlying erythema, induration, or other signs of local infection.  Skin prepped in a sterile fashion.  Local anesthesia: Topical Ethyl chloride.  With sterile technique and under real time ultrasound guidance: 1/2 cc Kenalog 40, 1/2 cc lidocaine injected easily Completed without difficulty  Pain immediately resolved suggesting accurate placement of the medication.  Advised to call if fevers/chills, erythema, induration, drainage, or persistent bleeding.  Images permanently stored and available for review in the ultrasound unit.  Impression: Technically successful ultrasound guided injection.  Impression and Recommendations:    Chronic foot pain, right Dorsal midfoot pain. Pes cavus. X-rays, suspect osteoarthritis. Return for custom orthotics.  Metatarsalgia of left foot History of 2/3 Morton's neuroma excision. Now with second MTP metatarsalgia. Injection into the second MTP. Return for custom molded orthotics with a metatarsal pad. ___________________________________________ Gwen Her. Dianah Field, M.D., ABFM., CAQSM. Primary Care and [place] Instructor of [place] [facility] [employer]

## 2018-08-20 NOTE — Assessment & Plan Note (Signed)
History of 2/3 Morton's neuroma excision. Now with second MTP metatarsalgia. Injection into the second MTP. Return for custom molded orthotics with a metatarsal pad.

## 2018-08-20 NOTE — Assessment & Plan Note (Signed)
Dorsal midfoot pain. Pes cavus. X-rays, suspect osteoarthritis. Return for custom orthotics.

## 2018-08-24 ENCOUNTER — Encounter: Payer: Self-pay | Admitting: Sports Medicine

## 2018-08-24 ENCOUNTER — Ambulatory Visit (INDEPENDENT_AMBULATORY_CARE_PROVIDER_SITE_OTHER): Payer: Medicare Other | Admitting: Sports Medicine

## 2018-08-24 DIAGNOSIS — M7742 Metatarsalgia, left foot: Secondary | ICD-10-CM | POA: Diagnosis not present

## 2018-08-24 NOTE — Progress Notes (Signed)
    Patient was fitted for a : standard, cushioned, semi-rigid orthotic. The orthotic was heated and afterward the patient stood on the orthotic blank positioned on the orthotic stand. The patient was positioned in subtalar neutral position and 10 degrees of ankle dorsiflexion in a weight bearing stance. After completion of molding, a stable base was applied to the orthotic blank. The blank was ground to a stable position for weight bearing. Size: 7 Base: [employer] and safety inspector and Padding: None The patient ambulated these, and they were very comfortable.  I spent 40 minutes with this patient, greater than 50% was face-to-face time counseling regarding the below diagnosis, specifically discussing current and future treatment options for metatarsalgia.  ___________________________________________ Gwen Her. Dianah Field, M.D., ABFM., CAQSM. Primary Care and [place] Instructor of [place] [facility] [employer]

## 2018-08-24 NOTE — Assessment & Plan Note (Signed)
History of a 2/3 Morton's neuroma excision in the distant past. Treated her at the last visit for second MTP metatarsalgia with a second metatarsophalangeal joint injection. She returns today with complete pain relief. I also made custom orthotics today, no metatarsal pad needed. Return to see me in 1 month.

## 2018-09-02 DIAGNOSIS — H401112 Primary open-angle glaucoma, right eye, moderate stage: Secondary | ICD-10-CM | POA: Diagnosis not present

## 2018-09-02 DIAGNOSIS — H401121 Primary open-angle glaucoma, left eye, mild stage: Secondary | ICD-10-CM | POA: Diagnosis not present

## 2018-09-23 ENCOUNTER — Telehealth: Payer: Self-pay

## 2018-09-23 DIAGNOSIS — Z0000 Encounter for general adult medical examination without abnormal findings: Secondary | ICD-10-CM

## 2018-09-23 DIAGNOSIS — Z131 Encounter for screening for diabetes mellitus: Secondary | ICD-10-CM

## 2018-09-23 DIAGNOSIS — Z13220 Encounter for screening for lipoid disorders: Secondary | ICD-10-CM

## 2018-09-23 NOTE — Telephone Encounter (Signed)
[place] printed and ok to fax.

## 2018-09-23 NOTE — Telephone Encounter (Signed)
Chelsey Estes called today and is scheduled for her physical this Friday 09/25/2018, and she was wanting to have her labwork done before she comes up to see you. Just wanted to let you know. Thanks!

## 2018-09-24 DIAGNOSIS — Z131 Encounter for screening for diabetes mellitus: Secondary | ICD-10-CM | POA: Diagnosis not present

## 2018-09-24 DIAGNOSIS — Z13220 Encounter for screening for lipoid disorders: Secondary | ICD-10-CM | POA: Diagnosis not present

## 2018-09-24 DIAGNOSIS — Z0000 Encounter for general adult medical examination without abnormal findings: Secondary | ICD-10-CM | POA: Diagnosis not present

## 2018-09-24 LAB — COMPLETE METABOLIC PANEL WITH GFR
AG Ratio: 1.7 (calc) (ref 1.0–2.5)
ALT: 11 U/L (ref 6–29)
AST: 12 U/L (ref 10–35)
Albumin: 4.1 g/dL (ref 3.6–5.1)
Alkaline phosphatase (APISO): 57 U/L (ref 33–130)
BUN: 19 mg/dL (ref 7–25)
CALCIUM: 9.9 mg/dL (ref 8.6–10.4)
CHLORIDE: 107 mmol/L (ref 98–110)
CO2: 26 mmol/L (ref 20–32)
Creat: 0.78 mg/dL (ref 0.60–0.93)
GFR, EST AFRICAN AMERICAN: 85 mL/min/{1.73_m2} (ref >=60)
GFR, EST NON AFRICAN AMERICAN: 73 mL/min/{1.73_m2} (ref >=60)
GLUCOSE: 90 mg/dL (ref 65–99)
Globulin: 2.4 g/dL (calc) (ref 1.9–3.7)
POTASSIUM: 4 mmol/L (ref 3.5–5.3)
Sodium: 140 mmol/L (ref 135–146)
TOTAL PROTEIN: 6.5 g/dL (ref 6.1–8.1)
Total Bilirubin: 0.4 mg/dL (ref 0.2–1.2)

## 2018-09-24 LAB — LIPID PANEL W/REFLEX DIRECT LDL
CHOL/HDL RATIO: 3.4 (calc) (ref <5.0)
Cholesterol: 169 mg/dL (ref <200)
HDL: 50 mg/dL — ABNORMAL LOW (ref >50)
LDL Cholesterol (Calc): 96 mg/dL (calc)
NON-HDL CHOLESTEROL (CALC): 119 mg/dL (ref <130)
Triglycerides: 135 mg/dL (ref <150)

## 2018-09-24 LAB — TSH: TSH: 1.48 mIU/L (ref 0.40–4.50)

## 2018-09-25 ENCOUNTER — Ambulatory Visit (INDEPENDENT_AMBULATORY_CARE_PROVIDER_SITE_OTHER): Payer: Medicare Other | Admitting: Sports Medicine

## 2018-09-25 ENCOUNTER — Encounter: Payer: Self-pay | Admitting: Sports Medicine

## 2018-09-25 ENCOUNTER — Ambulatory Visit (INDEPENDENT_AMBULATORY_CARE_PROVIDER_SITE_OTHER): Payer: Medicare Other | Admitting: Physician Assistant

## 2018-09-25 ENCOUNTER — Encounter: Payer: Self-pay | Admitting: Physician Assistant

## 2018-09-25 VITALS — BP 111/66 | HR 62 | Wt 155.0 lb

## 2018-09-25 DIAGNOSIS — F514 Sleep terrors [night terrors]: Secondary | ICD-10-CM

## 2018-09-25 DIAGNOSIS — Z23 Encounter for immunization: Secondary | ICD-10-CM | POA: Diagnosis not present

## 2018-09-25 DIAGNOSIS — E782 Mixed hyperlipidemia: Secondary | ICD-10-CM | POA: Diagnosis not present

## 2018-09-25 DIAGNOSIS — F339 Major depressive disorder, recurrent, unspecified: Secondary | ICD-10-CM | POA: Diagnosis not present

## 2018-09-25 DIAGNOSIS — M19071 Primary osteoarthritis, right ankle and foot: Secondary | ICD-10-CM | POA: Diagnosis not present

## 2018-09-25 DIAGNOSIS — I10 Essential (primary) hypertension: Secondary | ICD-10-CM | POA: Diagnosis not present

## 2018-09-25 DIAGNOSIS — R296 Repeated falls: Secondary | ICD-10-CM | POA: Diagnosis not present

## 2018-09-25 DIAGNOSIS — R0683 Snoring: Secondary | ICD-10-CM | POA: Diagnosis not present

## 2018-09-25 DIAGNOSIS — F419 Anxiety disorder, unspecified: Secondary | ICD-10-CM | POA: Diagnosis not present

## 2018-09-25 DIAGNOSIS — Z0000 Encounter for general adult medical examination without abnormal findings: Secondary | ICD-10-CM

## 2018-09-25 MED ORDER — BUPROPION HCL ER (XL) 300 MG PO TB24: ORAL_TABLET | ORAL | 1 refills | Status: DC

## 2018-09-25 MED ORDER — SIMVASTATIN 10 MG PO TABS: 10.0000 mg | ORAL_TABLET | Freq: Every day | ORAL | 4 refills | Status: DC

## 2018-09-25 MED ORDER — AMLODIPINE BESY-BENAZEPRIL HCL 5-10 MG PO CAPS: 1.0000 | ORAL_CAPSULE | Freq: Every day | ORAL | 1 refills | Status: DC

## 2018-09-25 MED ORDER — DICLOFENAC SODIUM 2 % TD SOLN: 2.0000 | Freq: Two times a day (BID) | TRANSDERMAL | 11 refills | Status: DC

## 2018-09-25 NOTE — Telephone Encounter (Signed)
Discussed with patient in office

## 2018-09-25 NOTE — Patient Instructions (Signed)
Night terrors- there is medication but would like to hold on it right now.  Decrease down ativan down to 1/4 tablet.

## 2018-09-25 NOTE — Assessment & Plan Note (Signed)
Dorsal midfoot TMT bossing. Pain dorsally. Switching to topical Pennsaid (diclofenac 2% topical). She will wear orthotics in her new shoes. Return to see me in 2 weeks, tarsometatarsal injection with ultrasound guidance if no better.

## 2018-09-25 NOTE — Progress Notes (Signed)
j

## 2018-09-25 NOTE — Progress Notes (Signed)
Subjective:    CC: Right foot pain  HPI: Chelsey Estes is a pleasant 77 year old female, we treated her left 2/3 intermetatarsal bursitis with an injection many months ago, she continues to be pain-free from the standpoint.  Unfortunately she has continued to have pain that she localizes over the dorsum of her right foot, at the tarsometatarsal joints, worse after recent fall.  Moderate, persistent, localized without radiation.  I reviewed the past medical history, family history, social history, surgical history, and allergies today and no changes were needed.  Please see the problem list section below in epic for further details.  Past Medical History: Past Medical History:  Diagnosis Date  . Anxiety   . Depression   . Hyperlipidemia   . Hypertension    Past Surgical History: Past Surgical History:  Procedure Laterality Date  . ABDOMINAL HYSTERECTOMY    . ANTERIOR (CYSTOCELE) AND POSTERIOR REPAIR (RECTOCELE) WITH XENFORM GRAFT AND SACROSPINOUS FIXATION     Social History: Social History   Socioeconomic History  . Marital status: Married    Spouse name: Not on file  . Number of children: Not on file  . Years of education: Not on file  . Highest education level: Not on file  Occupational History  . Not on file  Social Needs  . Financial resource strain: Not on file  . Food insecurity:    Worry: Not on file    Inability: Not on file  . Transportation needs:    Medical: Not on file    Non-medical: Not on file  Tobacco Use  . Smoking status: Never Smoker  . Smokeless tobacco: Never Used  Substance and Sexual Activity  . Alcohol use: No  . Drug use: No  . Sexual activity: Not Currently  Lifestyle  . Physical activity:    Days per week: Not on file    Minutes per session: Not on file  . Stress: Not on file  Relationships  . Social connections:    Talks on phone: Not on file    Gets together: Not on file    Attends religious service: Not on file    Active member of club  or organization: Not on file    Attends meetings of clubs or organizations: Not on file    Relationship status: Not on file  Other Topics Concern  . Not on file  Social History Narrative  . Not on file   Family History: Family History  Problem Relation Age of Onset  . Alcohol abuse Father   . Cancer Father        esophageal  . Stroke Maternal Grandfather   . Alcohol abuse Paternal Grandfather    Allergies: No Known Allergies Medications: See med rec.  Review of Systems: No fevers, chills, night sweats, weight loss, chest pain, or shortness of breath.   Objective:    General: Well Developed, well nourished, and in no acute distress.  Neuro: Alert and oriented x3, extra-ocular muscles intact, sensation grossly intact.  HEENT: Normocephalic, atraumatic, pupils equal round reactive to light, neck supple, no masses, no lymphadenopathy, thyroid nonpalpable.  Skin: Warm and dry, no rashes. Cardiac: Regular rate and rhythm, no murmurs rubs or gallops, no lower extremity edema.  Respiratory: Clear to auscultation bilaterally. Not using accessory muscles, speaking in full sentences. Right foot: No visible erythema or swelling. Range of motion is full in all directions. Strength is 5/5 in all directions. No hallux valgus. No pes cavus or pes planus. No abnormal callus noted. No  pain over the navicular prominence, or base of fifth metatarsal. No tenderness to palpation of the calcaneal insertion of plantar fascia. No pain at the Achilles insertion. No pain over the calcaneal bursa. No pain of the retrocalcaneal bursa. Tarsometatarsal bossing with tenderness dorsally at the intertarsal and tarsometatarsal joints. No hallux rigidus or limitus. No tenderness palpation over interphalangeal joints. No pain with compression of the metatarsal heads. Neurovascularly intact distally.  X-rays personally reviewed, there is tarsometatarsal bossing and intertarsal/tarsometatarsal  osteoarthritis.  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 Pain dorsally. Switching to topical Pennsaid (diclofenac 2% topical). She will wear orthotics in her new shoes. Return to see me in 2 weeks, tarsometatarsal injection with ultrasound guidance if no better. ___________________________________________ Gwen Her. Dianah Field, M.D., ABFM., CAQSM. Primary Care and [place] Instructor of [employer] [facility] [employer]

## 2018-09-28 NOTE — Progress Notes (Signed)
Subjective:    Patient ID: Chelsey Estes, female    DOB: [birth year], 77 y.o.   MRN: [id number]  HPI  Pt is a 77 yo female with HTN, HLD, MDD, anxiety, snoring who presents to the clinic for medication refill.   She feels like she is sleeping good and rested when she gets up. She does snore. She states she would not wear CPAP even if she needed it. She does have night terrors on average once a week.   She denies any headache, palpitations, SOB, vision changes.   She has had multiple falls but all due to her animals.   .. Active Ambulatory Problems    Diagnosis Date Noted  . History of shingles 04/08/2017  . Adenomatous polyp 04/08/2017  . Pure hypercholesterolemia 04/08/2017  . History of kidney stones 04/08/2017  . Essential hypertension 04/08/2017  . Depression, recurrent ([place]) 04/08/2017  . Anxiety 04/08/2017  . Greater trochanteric bursitis of left hip 04/08/2017  . Left hip pain 04/10/2017  . Metatarsalgia of left foot 08/20/2018  . Chronic foot pain, right 08/20/2018  . Primary osteoarthritis of right foot 09/25/2018  . Snoring 09/25/2018  . Night terrors, adult 09/25/2018  . Multiple falls 09/25/2018   Resolved Ambulatory Problems    Diagnosis Date Noted  . No Resolved Ambulatory Problems   Past Medical History:  Diagnosis Date  . Depression   . Hyperlipidemia   . Hypertension       Review of Systems See HPI.     Objective:   Physical Exam  Constitutional: She is oriented to person, place, and time. She appears well-developed and well-nourished.  HENT:  Head: Normocephalic and atraumatic.  Cardiovascular: Normal rate and regular rhythm.  Pulmonary/Chest: Effort normal and breath sounds normal.  Neurological: She is alert and oriented to person, place, and time.  Psychiatric: She has a normal mood and affect. Her behavior is normal.          Assessment & Plan:  Marland KitchenMarland KitchenDiagnoses and all orders for this visit:  Preventative health care  Essential  hypertension -     amLODipine-benazepril (LOTREL) 5-10 MG capsule; Take 1 capsule by mouth daily.  Mixed hyperlipidemia -     simvastatin (ZOCOR) 10 MG tablet; Take 1 tablet (10 mg total) by mouth daily.  Depression, recurrent (HCC) -     buPROPion (WELLBUTRIN XL) 300 MG 24 hr tablet; TAKE 1 TABLET(300 MG) BY MOUTH DAILY.  Anxiety -     buPROPion (WELLBUTRIN XL) 300 MG 24 hr tablet; TAKE 1 TABLET(300 MG) BY MOUTH DAILY.  Night terrors, adult  Snoring  Multiple falls  Need for pneumococcal vaccination -     Pneumococcal polysaccharide vaccine 23-valent greater than or equal to 2yo subcutaneous/IM   .Marland Kitchen Depression screen [facility] 2/9 03/26/2018 10/08/2017 04/08/2017  Decreased Interest 1 1 1   Down, Depressed, Hopeless 1 0 1  PHQ - 2 Score 2 1 2   Altered sleeping 1 - 1  Tired, decreased energy 1 - 1  Change in appetite - - 2  Feeling bad or failure about yourself  0 - 0  Trouble concentrating 0 - 0  Moving slowly or fidgety/restless 0 - 0  Suicidal thoughts 0 - 0  PHQ-9 Score 4 - 6  Difficult doing work/chores Somewhat difficult - -    BP looks great.  Medications refilled today.   Discussed good sleep routine. Would like to see her come more off ativan. Discussed decrease down to 1/4 tablet.   Discussed night  terrors. She does not have often enough to start medication.   cologuard faxed infofor kit today.   Discussed labs today.   Follow up 6 months.

## 2018-10-01 DIAGNOSIS — R109 Unspecified abdominal pain: Secondary | ICD-10-CM | POA: Diagnosis not present

## 2018-10-05 DIAGNOSIS — R1084 Generalized abdominal pain: Secondary | ICD-10-CM | POA: Diagnosis not present

## 2018-10-05 DIAGNOSIS — R109 Unspecified abdominal pain: Secondary | ICD-10-CM | POA: Diagnosis not present

## 2018-10-05 DIAGNOSIS — K5750 Diverticulosis of both small and large intestine without perforation or abscess without bleeding: Secondary | ICD-10-CM | POA: Diagnosis not present

## 2018-10-05 DIAGNOSIS — N132 Hydronephrosis with renal and ureteral calculous obstruction: Secondary | ICD-10-CM | POA: Diagnosis not present

## 2018-10-05 DIAGNOSIS — K8020 Calculus of gallbladder without cholecystitis without obstruction: Secondary | ICD-10-CM | POA: Diagnosis not present

## 2018-10-09 ENCOUNTER — Encounter: Payer: Self-pay | Admitting: Sports Medicine

## 2018-10-09 ENCOUNTER — Ambulatory Visit (INDEPENDENT_AMBULATORY_CARE_PROVIDER_SITE_OTHER): Payer: Medicare Other | Admitting: Sports Medicine

## 2018-10-09 ENCOUNTER — Telehealth: Payer: Self-pay

## 2018-10-09 DIAGNOSIS — M19071 Primary osteoarthritis, right ankle and foot: Secondary | ICD-10-CM

## 2018-10-09 MED ORDER — DICLOFENAC SODIUM 2 % TD SOLN: 2.0000 | Freq: Two times a day (BID) | TRANSDERMAL | 11 refills | Status: DC

## 2018-10-09 MED ORDER — DICLOFENAC SODIUM 2 % TD SOLN: 2.0000 | Freq: Two times a day (BID) | TRANSDERMAL | 3 refills | Status: DC

## 2018-10-09 NOTE — Assessment & Plan Note (Signed)
Dorsal tarsometatarsal bossing. Pain dorsally, consistent with midfoot osteoarthritis. Good relief with Pennsaid (diclofenac 2% topical)'s and wearing her orthotics. I am going to add some more Pennsaid (diclofenac 2% topical) called into the local pharmacy, we can certainly switch to Voltaren if too expensive. Return as needed.

## 2018-10-09 NOTE — Progress Notes (Signed)
Subjective:    CC: Follow-up  HPI: This is a pleasant 77 year old female, her foot pain has resolved.  I reviewed the past medical history, family history, social history, surgical history, and allergies today and no changes were needed.  Please see the problem list section below in epic for further details.  Past Medical History: Past Medical History:  Diagnosis Date  . Anxiety   . Depression   . Hyperlipidemia   . Hypertension    Past Surgical History: Past Surgical History:  Procedure Laterality Date  . ABDOMINAL HYSTERECTOMY    . ANTERIOR (CYSTOCELE) AND POSTERIOR REPAIR (RECTOCELE) WITH XENFORM GRAFT AND SACROSPINOUS FIXATION     Social History: Social History   Socioeconomic History  . Marital status: Married    Spouse name: Not on file  . Number of children: Not on file  . Years of education: Not on file  . Highest education level: Not on file  Occupational History  . Not on file  Social Needs  . Financial resource strain: Not on file  . Food insecurity:    Worry: Not on file    Inability: Not on file  . Transportation needs:    Medical: Not on file    Non-medical: Not on file  Tobacco Use  . Smoking status: Never Smoker  . Smokeless tobacco: Never Used  Substance and Sexual Activity  . Alcohol use: No  . Drug use: No  . Sexual activity: Not Currently  Lifestyle  . Physical activity:    Days per week: Not on file    Minutes per session: Not on file  . Stress: Not on file  Relationships  . Social connections:    Talks on phone: Not on file    Gets together: Not on file    Attends religious service: Not on file    Active member of club or organization: Not on file    Attends meetings of clubs or organizations: Not on file    Relationship status: Not on file  Other Topics Concern  . Not on file  Social History Narrative  . Not on file   Family History: Family History  Problem Relation Age of Onset  . Alcohol abuse Father   . Cancer Father          esophageal  . Stroke Maternal Grandfather   . Alcohol abuse Paternal Grandfather    Allergies: No Known Allergies Medications: See med rec.  Review of Systems: No fevers, chills, night sweats, weight loss, chest pain, or shortness of breath.   Objective:    General: Well Developed, well nourished, and in no acute distress.  Neuro: Alert and oriented x3, extra-ocular muscles intact, sensation grossly intact.  HEENT: Normocephalic, atraumatic, pupils equal round reactive to light, neck supple, no masses, no lymphadenopathy, thyroid nonpalpable.  Skin: Warm and dry, no rashes. Cardiac: Regular rate and rhythm, no murmurs rubs or gallops, no lower extremity edema.  Respiratory: Clear to auscultation bilaterally. Not using accessory muscles, speaking in full sentences.  Impression and Recommendations:    Primary osteoarthritis of right foot Dorsal tarsometatarsal bossing. Pain dorsally, consistent with midfoot osteoarthritis. Good relief with Pennsaid (diclofenac 2% topical)'s and wearing her orthotics. I am going to add some more Pennsaid (diclofenac 2% topical) called into the local pharmacy, we can certainly switch to Voltaren if too expensive. Return as needed. ___________________________________________ Gwen Her. Dianah Field, M.D., ABFM., CAQSM. Primary Care and Sports Medicine [facility]  Adjunct Professor of [employer]

## 2018-10-09 NOTE — Telephone Encounter (Signed)
Sending to mail order, she should let me know if it is still too expensive with mail-order and we can switch to Voltaren 1%

## 2018-10-09 NOTE — Telephone Encounter (Signed)
Diclofenac with the local pharmacy is $2,000. She would like it sent to mail order. Please advise.

## 2018-10-15 DIAGNOSIS — K5900 Constipation, unspecified: Secondary | ICD-10-CM | POA: Diagnosis not present

## 2018-10-15 DIAGNOSIS — I878 Other specified disorders of veins: Secondary | ICD-10-CM | POA: Diagnosis not present

## 2018-10-15 DIAGNOSIS — N201 Calculus of ureter: Secondary | ICD-10-CM | POA: Diagnosis not present

## 2018-10-15 DIAGNOSIS — N200 Calculus of kidney: Secondary | ICD-10-CM | POA: Diagnosis not present

## 2018-11-10 DIAGNOSIS — R300 Dysuria: Secondary | ICD-10-CM | POA: Diagnosis not present

## 2018-11-10 DIAGNOSIS — N200 Calculus of kidney: Secondary | ICD-10-CM | POA: Diagnosis not present

## 2018-11-23 DIAGNOSIS — K5900 Constipation, unspecified: Secondary | ICD-10-CM | POA: Diagnosis not present

## 2018-11-23 DIAGNOSIS — N200 Calculus of kidney: Secondary | ICD-10-CM | POA: Diagnosis not present

## 2018-11-23 DIAGNOSIS — I878 Other specified disorders of veins: Secondary | ICD-10-CM | POA: Diagnosis not present

## 2018-11-30 DIAGNOSIS — I10 Essential (primary) hypertension: Secondary | ICD-10-CM | POA: Diagnosis not present

## 2018-11-30 DIAGNOSIS — N200 Calculus of kidney: Secondary | ICD-10-CM | POA: Diagnosis not present

## 2018-12-14 DIAGNOSIS — N200 Calculus of kidney: Secondary | ICD-10-CM | POA: Diagnosis not present

## 2018-12-15 DIAGNOSIS — N200 Calculus of kidney: Secondary | ICD-10-CM | POA: Diagnosis not present

## 2019-01-18 DIAGNOSIS — Z961 Presence of intraocular lens: Secondary | ICD-10-CM | POA: Diagnosis not present

## 2019-01-18 DIAGNOSIS — H1045 Other chronic allergic conjunctivitis: Secondary | ICD-10-CM | POA: Diagnosis not present

## 2019-01-18 DIAGNOSIS — H52203 Unspecified astigmatism, bilateral: Secondary | ICD-10-CM | POA: Diagnosis not present

## 2019-01-18 DIAGNOSIS — H401131 Primary open-angle glaucoma, bilateral, mild stage: Secondary | ICD-10-CM | POA: Diagnosis not present

## 2019-01-25 ENCOUNTER — Encounter: Payer: Self-pay | Admitting: Physician Assistant

## 2019-01-25 ENCOUNTER — Ambulatory Visit (INDEPENDENT_AMBULATORY_CARE_PROVIDER_SITE_OTHER): Payer: Medicare Other | Admitting: Physician Assistant

## 2019-01-25 ENCOUNTER — Other Ambulatory Visit: Payer: Self-pay | Admitting: Physician Assistant

## 2019-01-25 VITALS — BP 138/62 | HR 64 | Temp 98.4°F | Wt 149.0 lb

## 2019-01-25 DIAGNOSIS — F339 Major depressive disorder, recurrent, unspecified: Secondary | ICD-10-CM | POA: Diagnosis not present

## 2019-01-25 DIAGNOSIS — I10 Essential (primary) hypertension: Secondary | ICD-10-CM | POA: Diagnosis not present

## 2019-01-25 DIAGNOSIS — F419 Anxiety disorder, unspecified: Secondary | ICD-10-CM

## 2019-01-25 DIAGNOSIS — I951 Orthostatic hypotension: Secondary | ICD-10-CM | POA: Diagnosis not present

## 2019-01-25 DIAGNOSIS — R42 Dizziness and giddiness: Secondary | ICD-10-CM

## 2019-01-25 MED ORDER — LISINOPRIL 10 MG PO TABS: 10.0000 mg | ORAL_TABLET | Freq: Every day | ORAL | 1 refills | Status: DC

## 2019-01-25 NOTE — Patient Instructions (Addendum)
Stop amolodapine/benazpril.  Start lisinopril.   Will make neurologist appt.   Consider buspar.   Buspirone tablets What is this medicine? BUSPIRONE (byoo SPYE rone) is used to treat anxiety disorders. This medicine may be used for other purposes; ask your health care provider or pharmacist if you have questions. COMMON BRAND NAME(S): BuSpar What should I tell my health care provider before I take this medicine? They need to know if you have any of these conditions: -kidney or liver disease -an unusual or allergic reaction to buspirone, other medicines, foods, dyes, or preservatives -pregnant or trying to get pregnant -breast-feeding How should I use this medicine? Take this medicine by mouth with a glass of water. Follow the directions on the prescription label. You may take this medicine with or without food. To ensure that this medicine always works the same way for you, you should take it either always with or always without food. Take your doses at regular intervals. Do not take your medicine more often than directed. Do not stop taking except on the advice of your doctor or health care professional. Talk to your pediatrician regarding the use of this medicine in children. Special care may be needed. Overdosage: If you think you have taken too much of this medicine contact a poison control center or emergency room at once. NOTE: This medicine is only for you. Do not share this medicine with others. What if I miss a dose? If you miss a dose, take it as soon as you can. If it is almost time for your next dose, take only that dose. Do not take double or extra doses. What may interact with this medicine? Do not take this medicine with any of the following medications: -linezolid -MAOIs like Carbex, Eldepryl, Marplan, Nardil, and Parnate -methylene blue -procarbazine This medicine may also interact with the following  medications: -diazepam -digoxin -diltiazem -erythromycin -grapefruit juice -haloperidol -medicines for mental depression or mood problems -medicines for seizures like carbamazepine, phenobarbital and phenytoin -nefazodone -other medications for anxiety -rifampin -ritonavir -some antifungal medicines like itraconazole, ketoconazole, and voriconazole -verapamil -warfarin This list may not describe all possible interactions. Give your health care provider a list of all the medicines, herbs, non-prescription drugs, or dietary supplements you use. Also tell them if you smoke, drink alcohol, or use illegal drugs. Some items may interact with your medicine. What should I watch for while using this medicine? Visit your doctor or health care professional for regular checks on your progress. It may take 1 to 2 weeks before your anxiety gets better. You may get drowsy or dizzy. Do not drive, use machinery, or do anything that needs mental alertness until you know how this drug affects you. Do not stand or sit up quickly, especially if you are an older patient. This reduces the risk of dizzy or fainting spells. Alcohol can make you more drowsy and dizzy. Avoid alcoholic drinks. What side effects may I notice from receiving this medicine? Side effects that you should report to your doctor or health care professional as soon as possible: -blurred vision or other vision changes -chest pain -confusion -difficulty breathing -feelings of hostility or anger -muscle aches and pains -numbness or tingling in hands or feet -ringing in the ears -skin rash and itching -vomiting -weakness Side effects that usually do not require medical attention (report to your doctor or health care professional if they continue or are bothersome): -disturbed dreams, nightmares -headache -nausea -restlessness or nervousness -sore throat and nasal congestion -stomach upset  This list may not describe all possible side  effects. Call your doctor for medical advice about side effects. You may report side effects to FDA at 1-800-FDA-1088. Where should I keep my medicine? Keep out of the reach of children. Store at room temperature below 30 degrees C (86 degrees F). Protect from light. Keep container tightly closed. Throw away any unused medicine after the expiration date. NOTE: This sheet is a summary. It may not cover all possible information. If you have questions about this medicine, talk to your doctor, pharmacist, or health care provider.  2019 Elsevier/Gold Standard (2010-07-12 18:06:11)   Orthostatic Hypotension Blood pressure is a measurement of how strongly, or weakly, your blood is pressing against the walls of your arteries. Orthostatic hypotension is a sudden drop in blood pressure that happens when you quickly change positions, such as when you get up from sitting or lying down. Arteries are blood vessels that carry blood from your heart throughout your body. When blood pressure is too low, you may not get enough blood to your brain or to the rest of your organs. This can cause weakness, light-headedness, rapid heartbeat, and fainting. This can last for just a few seconds or for up to a few minutes. Orthostatic hypotension is usually not a serious problem. However, if it happens frequently or gets worse, it may be a sign of something more serious. What are the causes? This condition may be caused by:  Sudden changes in posture, such as standing up quickly after you have been sitting or lying down.  Blood loss.  Loss of body fluids (dehydration).  Heart problems.  Hormone (endocrine) problems.  Pregnancy.  Severe infection.  Lack of certain nutrients.  Severe allergic reactions (anaphylaxis).  Certain medicines, such as blood pressure medicine or medicines that make the body lose excess fluids (diuretics). Sometimes, this condition can be caused by not taking medicine as directed, such as  taking too much of a certain medicine. What increases the risk? The following factors may make you more likely to develop this condition:  Age. Risk increases as you get older.  Conditions that affect the heart or the central nervous system.  Taking certain medicines, such as blood pressure medicine or diuretics.  Being pregnant. What are the signs or symptoms? Symptoms of this condition may include:  Weakness.  Light-headedness.  Dizziness.  Blurred vision.  Fatigue.  Rapid heartbeat.  Fainting, in severe cases. How is this diagnosed? This condition is diagnosed based on:  Your medical history.  Your symptoms.  Your blood pressure measurement. Your health care provider will check your blood pressure when you are: ? Lying down. ? Sitting. ? Standing. A blood pressure reading is recorded as two numbers, such as "120 over 80" (or 120/80). The first ("top") number is called the systolic pressure. It is a measure of the pressure in your arteries as your heart beats. The second ("bottom") number is called the diastolic pressure. It is a measure of the pressure in your arteries when your heart relaxes between beats. Blood pressure is measured in a unit called mm Hg. Healthy blood pressure for most adults is 120/80. If your blood pressure is below 90/60, you may be diagnosed with hypotension. Other information or tests that may be used to diagnose orthostatic hypotension include:  Your other vital signs, such as your heart rate and temperature.  Blood tests.  Tilt table test. For this test, you will be safely secured to a table that moves you from  a lying position to an upright position. Your heart rhythm and blood pressure will be monitored during the test. How is this treated? This condition may be treated by:  Changing your diet. This may involve eating more salt (sodium) or drinking more water.  Taking medicines to raise your blood pressure.  Changing the dosage of  certain medicines you are taking that might be lowering your blood pressure.  Wearing compression stockings. These stockings help to prevent blood clots and reduce swelling in your legs. In some cases, you may need to go to the hospital for:  Fluid replacement. This means you will receive fluids through an IV.  Blood replacement. This means you will receive donated blood through an IV (transfusion).  Treating an infection or heart problems, if this applies.  Monitoring. You may need to be monitored while medicines that you are taking wear off. Follow these instructions at home: Eating and drinking   Drink enough fluid to keep your urine pale yellow.  Eat a healthy diet, and follow instructions from your health care provider about eating or drinking restrictions. A healthy diet includes: ? Fresh fruits and vegetables. ? Whole grains. ? Lean meats. ? Low-fat dairy products.  Eat extra salt only as directed. Do not add extra salt to your diet unless your health care provider told you to do that.  Eat frequent, small meals.  Avoid standing up suddenly after eating. Medicines  Take over-the-counter and prescription medicines only as told by your health care provider. ? Follow instructions from your health care provider about changing the dosage of your current medicines, if this applies. ? Do not stop or adjust any of your medicines on your own. General instructions   Wear compression stockings as told by your health care provider.  Get up slowly from lying down or sitting positions. This gives your blood pressure a chance to adjust.  Avoid hot showers and excessive heat as directed by your health care provider.  Return to your normal activities as told by your health care provider. Ask your health care provider what activities are safe for you.  Do not use any products that contain nicotine or tobacco, such as cigarettes, e-cigarettes, and chewing tobacco. If you need help  quitting, ask your health care provider.  Keep all follow-up visits as told by your health care provider. This is important. Contact a health care provider if you:  Vomit.  Have diarrhea.  Have a fever for more than 2-3 days.  Feel more thirsty than usual.  Feel weak and tired. Get help right away if you:  Have chest pain.  Have a fast or irregular heartbeat.  Develop numbness in any part of your body.  Cannot move your arms or your legs.  Have trouble speaking.  Become sweaty or feel light-headed.  Faint.  Feel short of breath.  Have trouble staying awake.  Feel confused. Summary  Orthostatic hypotension is a sudden drop in blood pressure that happens when you quickly change positions.  Orthostatic hypotension is usually not a serious problem.  It is diagnosed by having your blood pressure taken lying down, sitting, and then standing.  It may be treated by changing your diet or adjusting your medicines. This information is not intended to replace advice given to you by your health care provider. Make sure you discuss any questions you have with your health care provider. Document Released: 11/22/2002 Document Revised: 05/28/2018 Document Reviewed: 05/28/2018 Elsevier Interactive Patient Education  [employer].

## 2019-01-25 NOTE — Progress Notes (Signed)
Subjective:    Patient ID: Chelsey Estes, female    DOB: December 31, 1940, 78 y.o.   MRN: [id number]  HPI  Pt is a 78 yo female with HTN, Depression, anxiety who presents to the clinic to follow up on dizziness.   She has been having dizziness for over a year. At times she will just feel light headed. She does admit that worse with position changes especially standing. When she tries to stand up to get going she will 'stagger a bit". She stopped taking xanax regularly in December. It has certainly made her anxiety worse but no effect on dizziness. She denies any headaches, vision changes, paresthesia, weakness in extremities.she is sleeping pretty good.   She has fallen once but she admits to tripping over something and not due to dizziness.   .. Active Ambulatory Problems    Diagnosis Date Noted  . History of shingles 04/08/2017  . Adenomatous polyp 04/08/2017  . Pure hypercholesterolemia 04/08/2017  . History of kidney stones 04/08/2017  . Essential hypertension 04/08/2017  . Depression, recurrent ([place]) 04/08/2017  . Anxiety 04/08/2017  . Greater trochanteric bursitis of left hip 04/08/2017  . Left hip pain 04/10/2017  . Metatarsalgia of left foot 08/20/2018  . Chronic foot pain, right 08/20/2018  . Primary osteoarthritis of right foot 09/25/2018  . Snoring 09/25/2018  . Night terrors, adult 09/25/2018  . Multiple falls 09/25/2018  . Orthostatic hypotension 02/01/2019  . Dizziness 02/01/2019   Resolved Ambulatory Problems    Diagnosis Date Noted  . No Resolved Ambulatory Problems   Past Medical History:  Diagnosis Date  . Depression   . Hyperlipidemia   . Hypertension        Review of Systems  All other systems reviewed and are negative.      Objective:   Physical Exam Vitals signs reviewed.  Constitutional:      Appearance: Normal appearance.  HENT:     Head: Normocephalic and atraumatic.     Mouth/Throat:     Pharynx: Oropharynx is clear.  Eyes:   Extraocular Movements: Extraocular movements intact.     Conjunctiva/sclera: Conjunctivae normal.     Pupils: Pupils are equal, round, and reactive to light.  Cardiovascular:     Rate and Rhythm: Normal rate and regular rhythm.     Pulses: Normal pulses.  Pulmonary:     Effort: Pulmonary effort is normal.     Breath sounds: Normal breath sounds.  Neurological:     General: No focal deficit present.     Mental Status: She is alert and oriented to person, place, and time.     Cranial Nerves: No cranial nerve deficit.     Sensory: No sensory deficit.     Motor: No weakness.     Coordination: Coordination normal.     Gait: Gait normal.     Deep Tendon Reflexes: Reflexes normal.     Comments: Negative dixhallpike.   Psychiatric:        Mood and Affect: Mood normal.           Assessment & Plan:  Marland KitchenMarland KitchenAlysen was seen today for dizziness.  Diagnoses and all orders for this visit:  Orthostatic hypotension -     Ambulatory referral to Neurology  Essential hypertension  Depression, recurrent ([place])  Dizziness -     Ambulatory referral to Neurology  Anxiety  Other orders -     Discontinue: lisinopril (PRINIVIL,ZESTRIL) 10 MG tablet; Take 1 tablet (10 mg total) by mouth daily.  orthostatic BP in office positive for greater than 57mmhg change with systolic in changes of position. This could be the cause of dizziness. Negative BBPV. Stop norvasc/benazpril combination. Start just lisinopril. One of her position readings was in the 100's over 70's. Discussed getting up slow. Make sure to stay hydrated.   I do not see any neurological symptoms on exam. Pt request neurologist appt. Will make for second opinion.   She is using xanax sparingly. Pt aware xanax can increase fall risk.  She continues to have problems with anxiety. Discussed buspar. Gave HO patient will consider. Continue on wellbutrin.   Follow up in 2 weeks to recheck BP and symptoms.   Marland KitchenSpent 30 minutes with patient and  greater than 50 percent of visit spent counseling patient regarding treatment plan.

## 2019-02-01 DIAGNOSIS — I951 Orthostatic hypotension: Secondary | ICD-10-CM | POA: Insufficient documentation

## 2019-02-01 DIAGNOSIS — R42 Dizziness and giddiness: Secondary | ICD-10-CM | POA: Insufficient documentation

## 2019-02-01 NOTE — Progress Notes (Signed)
Orthostatics are listed under extended vitals.

## 2019-02-08 ENCOUNTER — Encounter: Payer: Self-pay | Admitting: Physician Assistant

## 2019-02-08 ENCOUNTER — Ambulatory Visit (INDEPENDENT_AMBULATORY_CARE_PROVIDER_SITE_OTHER): Payer: Medicare Other | Admitting: Physician Assistant

## 2019-02-08 VITALS — BP 145/69 | HR 67 | Temp 97.8°F | Ht 60.25 in | Wt 155.0 lb

## 2019-02-08 DIAGNOSIS — F331 Major depressive disorder, recurrent, moderate: Secondary | ICD-10-CM | POA: Diagnosis not present

## 2019-02-08 DIAGNOSIS — Z811 Family history of alcohol abuse and dependence: Secondary | ICD-10-CM | POA: Diagnosis not present

## 2019-02-08 DIAGNOSIS — I951 Orthostatic hypotension: Secondary | ICD-10-CM | POA: Diagnosis not present

## 2019-02-08 DIAGNOSIS — R42 Dizziness and giddiness: Secondary | ICD-10-CM | POA: Diagnosis not present

## 2019-02-08 DIAGNOSIS — M4000 Postural kyphosis, site unspecified: Secondary | ICD-10-CM

## 2019-02-08 DIAGNOSIS — F419 Anxiety disorder, unspecified: Secondary | ICD-10-CM | POA: Diagnosis not present

## 2019-02-08 DIAGNOSIS — R2689 Other abnormalities of gait and mobility: Secondary | ICD-10-CM | POA: Diagnosis not present

## 2019-02-08 NOTE — Patient Instructions (Addendum)
Upper back posture corrector.  Will get scheduled for some counseling here in office.  Continue on lisinopril.  Will get some PT for balance.

## 2019-02-08 NOTE — Progress Notes (Signed)
Subjective:    Patient ID: Chelsey Estes, female    DOB: 04-14-1941, 78 y.o.   MRN: [id number]  HPI  Patient is a 78 year old female who was recently diagnosed with orthostatic hypotension after complaining about dizziness.  She comes in for follow-up.  We did make a medication change.  We left her only on lisinopril. She has been using compression stockings as tolerated.  She does admit that her dizziness has improved significantly.  She still feels like she has some balance issues and she shuffles her feet.  She denies any tremors.  She denies any memory issues.  She does become tearful when talking about her anxiety and depression.  Her husband is alcoholic and this is very hard on her.  She feels like she does not have anyone to understand her.  She is to be in a support group when she lived in [place].  Since she has moved to [place] she has not been able to find a support group.  She does not like the way she is felt on antidepressants.  She does have Ativan that she uses as needed.  .. Active Ambulatory Problems    Diagnosis Date Noted  . History of shingles 04/08/2017  . Adenomatous polyp 04/08/2017  . Pure hypercholesterolemia 04/08/2017  . History of kidney stones 04/08/2017  . Essential hypertension 04/08/2017  . Depression, recurrent ([place]) 04/08/2017  . Anxiety 04/08/2017  . Greater trochanteric bursitis of left hip 04/08/2017  . Left hip pain 04/10/2017  . Metatarsalgia of left foot 08/20/2018  . Chronic foot pain, right 08/20/2018  . Primary osteoarthritis of right foot 09/25/2018  . Snoring 09/25/2018  . Night terrors, adult 09/25/2018  . Multiple falls 09/25/2018  . Orthostatic hypotension 02/01/2019  . Dizziness 02/01/2019  . Moderate episode of recurrent major depressive disorder ([place]) 02/09/2019  . 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 Shuffling gait 02/09/2019  . Balance problems 02/09/2019   Resolved Ambulatory Problems    Diagnosis Date Noted  . No  Resolved Ambulatory Problems   Past Medical History:  Diagnosis Date  . Depression   . Hyperlipidemia   . Hypertension      Review of Systems  All other systems reviewed and are negative.      Objective:   Physical Exam Vitals signs reviewed.  Constitutional:      Appearance: Normal appearance.  Cardiovascular:     Rate and Rhythm: Normal rate.  Musculoskeletal:     Comments: Slight kyphosis.    Skin:    General: Skin is warm.  Neurological:     General: No focal deficit present.     Mental Status: She is alert and oriented to person, place, and time.     Comments: No tremor noted.   Psychiatric:        Mood and Affect: Mood normal.        Behavior: Behavior normal.     Comments: Tearful.            Assessment & Plan:  Marland KitchenMarland KitchenSharion was seen today for hypertension.  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  She was in the low 100s at last visit she is bumped up to 145.  I am okay as long she stays under 150/90.  For now stay on lisinopril 10 mg.  Patient is concerned with her slightly shuffled gait, kyphosis, balance instability.  She is concerned about Parkinson's.  I do not see any signs of Parkinson's on today's exam or warning signs.  She can certainly address this concern with neurology later this week.  For now I would like to work with physical therapy on balance and stability.  Strongly recommend her  to use Ativan only as needed as it can make dizziness, balance problems worse.   Discussed normal aging kyphosis. Could consider upper back posture corrector.   I certainly think patient's mood is more depressed and anxious.  She certainly has some stressors at home with her alcoholic husband.  I referred her to counseling here in the office.  Hopefully they have some resources that can maybe get her hooked up with the support group for women with spouses who are addicted.  Follow-up in 1 to 2 months.  For now we will hold off on medications since patient does not like the way she feels on this.  Follow up in 2 months.   .Spent 30 minutes with patient and greater than 50 percent of visit spent counseling patient regarding treatment plan.

## 2019-02-09 ENCOUNTER — Encounter: Payer: Self-pay | Admitting: Physician Assistant

## 2019-02-09 DIAGNOSIS — R2689 Other abnormalities of gait and mobility: Secondary | ICD-10-CM | POA: Insufficient documentation

## 2019-02-09 DIAGNOSIS — M4000 Postural kyphosis, site unspecified: Secondary | ICD-10-CM | POA: Insufficient documentation

## 2019-02-09 DIAGNOSIS — F331 Major depressive disorder, recurrent, moderate: Secondary | ICD-10-CM | POA: Insufficient documentation

## 2019-02-09 DIAGNOSIS — Z811 Family history of alcohol abuse and dependence: Secondary | ICD-10-CM | POA: Insufficient documentation

## 2019-02-10 DIAGNOSIS — R42 Dizziness and giddiness: Secondary | ICD-10-CM | POA: Diagnosis not present

## 2019-02-10 DIAGNOSIS — R270 Ataxia, unspecified: Secondary | ICD-10-CM | POA: Diagnosis not present

## 2019-02-15 ENCOUNTER — Ambulatory Visit (INDEPENDENT_AMBULATORY_CARE_PROVIDER_SITE_OTHER): Payer: Medicare Other | Admitting: Physical Therapy

## 2019-02-15 ENCOUNTER — Other Ambulatory Visit: Payer: Self-pay

## 2019-02-15 ENCOUNTER — Encounter: Payer: Self-pay | Admitting: Physical Therapy

## 2019-02-15 DIAGNOSIS — M6281 Muscle weakness (generalized): Secondary | ICD-10-CM | POA: Diagnosis not present

## 2019-02-15 DIAGNOSIS — R2681 Unsteadiness on feet: Secondary | ICD-10-CM

## 2019-02-15 DIAGNOSIS — Z9181 History of falling: Secondary | ICD-10-CM | POA: Diagnosis not present

## 2019-02-15 DIAGNOSIS — R42 Dizziness and giddiness: Secondary | ICD-10-CM

## 2019-02-15 DIAGNOSIS — R2689 Other abnormalities of gait and mobility: Secondary | ICD-10-CM | POA: Diagnosis not present

## 2019-02-15 NOTE — Therapy (Signed)
[facility] [place], [place], [address] Phone: [phone]   Fax:  [phone]  Physical Therapy Evaluation  Patient Details  Name: Chelsey Estes MRN: [id number] Date of Birth: [birth year] Referring Provider (PT): Donella Stade, [place]   Encounter Date: 02/15/2019  PT End of Session - 02/15/19 1048    Visit Number  1    Number of Visits  12    Date for PT Re-Evaluation  03/29/19    PT Start Time  0930    PT Stop Time  1005    PT Time Calculation (min)  35 min    Activity Tolerance  Patient tolerated treatment well    Behavior During Therapy  [facility] for tasks assessed/performed       Past Medical History:  Diagnosis Date  . Anxiety   . Depression   . Hyperlipidemia   . Hypertension     Past Surgical History:  Procedure Laterality Date  . ABDOMINAL HYSTERECTOMY    . ANTERIOR (CYSTOCELE) AND POSTERIOR REPAIR (RECTOCELE) WITH XENFORM GRAFT AND SACROSPINOUS FIXATION      There were no vitals filed for this visit.   Subjective Assessment - 02/15/19 0933    Subjective  Pt is a 78 y/o female who presents to OPPT for decreased balance with reports of a couple falls.  Pt states first fall around Easter 2019 with an onset of "head feeling funny" and off balance.  Pt states symptoms occur in open environments.  Medication changes helped with some symptoms but still present.    Limitations  Walking    Patient Stated Goals  walk without feeling lightheaded, improve balance    Currently in Pain?  Yes    Pain Score  --   c/o occasional feet and back pain; unrelated to referral   Effect of Pain on Daily Activities  will monitor but will not directly address         [facility] PT Assessment - 02/15/19 0936      Assessment   Medical Diagnosis  R26.89 (ICD-10-CM) - Balance problems; R42 (ICD-10-CM) - Dizziness    Referring Provider (PT)  Donella Stade, PA-C    Onset Date/Surgical Date  03/16/18    Hand Dominance   Right    Next MD Visit  03/15/19    Prior Therapy  none      Precautions   Precautions  Fall      Restrictions   Weight Bearing Restrictions  No      Balance Screen   Has the patient fallen in the past 6 months  Yes    How many times?  3    Has the patient had a decrease in activity level because of a fear of falling?   Yes    Is the patient reluctant to leave their home because of a fear of falling?   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    Leisure  hasn't joined gym since returning to [place]   Overall Cognitive Status  Within Functional Limits for tasks assessed      Posture/Postural Control   Posture/Postural Control  Postural limitations    Postural Limitations  Rounded Shoulders;Forward head      ROM / Strength   AROM / PROM / Strength  Strength      Strength   Overall Strength Comments  suspect hip extension and abduction weakness given functional limitations and gait deviations; tested in sitting    Strength Assessment Site  Hip;Knee;Ankle    Right/Left Hip  Right;Left    Right Hip Flexion  3/5    Left Hip Flexion  3/5    Right/Left Knee  Right;Left    Right Knee Flexion  4/5    Right Knee Extension  4/5    Left Knee Flexion  4/5    Left Knee Extension  4/5    Right/Left Ankle  Right;Left    Right Ankle Dorsiflexion  4/5    Left Ankle Dorsiflexion  4/5      Ambulation/Gait   Ambulation/Gait  Yes    Ambulation/Gait Assistance  5: Supervision    Gait Pattern  Decreased step length -  left;Decreased step length - right;Decreased stance time - right;Decreased arm swing - right;Decreased dorsiflexion - right;Shuffle;Trendelenburg;Decreased trunk rotation;Poor foot clearance - left;Poor foot clearance - right   mild shuffling with poor foot clearance noted     Standardized Balance Assessment   Standardized Balance Assessment  Berg Balance Test;Dynamic Gait Index;Timed Up and Go Test      Berg Balance Test   Sit to Stand  Able to stand without using hands and stabilize independently    Standing Unsupported  Able to stand safely 2 minutes    Sitting with Back Unsupported but Feet Supported on Floor or Stool  Able to sit safely and securely 2 minutes    Stand to Sit  Sits safely with minimal use of hands    Transfers  Able to transfer safely, minor use of hands    Standing Unsupported with Eyes Closed  Able to stand 10 seconds safely    Standing Unsupported with Feet Together  Able to place feet together independently and stand 1 minute safely    From Standing, Reach Forward with Outstretched Arm  Can reach confidently >25 cm (10")    From Standing Position, Pick up Object from Floor  Able to pick up shoe, needs supervision    From Standing Position, Turn to Look Behind Over each Shoulder  Looks behind from both sides and weight shifts well    Turn 360 Degrees  Able to turn 360 degrees safely but slowly    Standing Unsupported, Alternately Place Feet on Step/Stool  Able to complete 4 steps without aid or supervision    Standing Unsupported, One Foot in Front  Able to plae foot ahead of the other independently and hold 30 seconds    Standing on One Leg  Able to lift leg independently and hold equal to or more than 3 seconds    Total Score  48      Dynamic Gait Index   Level Surface  Mild Impairment    Change in Gait Speed  Mild Impairment    Gait with Horizontal Head Turns  Moderate Impairment    Gait with Vertical Head Turns  Mild Impairment  Gait and Pivot Turn  Normal     Step Over Obstacle  Mild Impairment    Step Around Obstacles  Mild Impairment    Steps  Mild Impairment    Total Score  16      Timed Up and Go Test   Normal TUG (seconds)  13.5    Manual TUG (seconds)  12.9    Cognitive TUG (seconds)  15.6                Objective measurements completed on examination: See above findings.              PT Education - 02/15/19 1048    Education Details  clinical findings, POC, goals of care    Person(s) Educated  Patient    Methods  Explanation    Comprehension  Verbalized understanding          PT Long Term Goals - 02/15/19 1053      PT LONG TERM GOAL #1   Title  independent with HEP    Status  New    Target Date  03/29/19      PT LONG TERM GOAL #2   Title  verbalize understanding of fall prevention strategies     Status  New    Target Date  03/29/19      PT LONG TERM GOAL #3   Title  BERG score improved to >/= 50/56 for improved balance    Status  New    Target Date  03/29/19      PT LONG TERM GOAL #4   Title  improve DGI to >/= 20/24 for improved mobility and decreased fall risk.    Status  New    Target Date  03/29/19             Plan - 02/15/19 1049    Clinical Impression Statement  Pt is a 78 y/o female who presents to OPPT for decreased balance and c/o occasional dizziness (described as lightheadedness).  Pt demonstrates decreased strength, and gait abnormalities affecting balance and safe functional mobility.  Pt will benefit from PT to address deficits listed.  Pt with recent neurology consult, and MRI/MRA ordered, but not scheduled at this time.  Goals of care may change pending further neuro work up and potential diagnosis.    Personal Factors and Comorbidities  Age;Comorbidity 3+    Comorbidities  HTN, depression, anxiety    Examination-Activity Limitations  Stairs;Stand;Transfers;Locomotion Level    Examination-Participation Restrictions  Cleaning;Community Activity    Stability/Clinical  Decision Making  Evolving/Moderate complexity    Clinical Decision Making  Moderate    Rehab Potential  Good    PT Frequency  2x / week    PT Duration  6 weeks    PT Treatment/Interventions  ADLs/Self Care Home Management;Cryotherapy;Canalith Repostioning;Gait training;Stair training;Functional mobility training;Neuromuscular re-education;Balance training;Therapeutic exercise;Therapeutic activities;Patient/family education;Manual techniques;Vestibular    PT Next Visit Plan  establish HEP for balance/strength (?OTAGO), assess vertigo/vestibular input, may benefit from PWR moves    Consulted and Agree with Plan of Care  Patient       Patient will benefit from skilled therapeutic intervention in order to improve the following deficits and impairments:  Abnormal gait, Dizziness, Postural dysfunction, Decreased mobility, Decreased strength, Impaired flexibility, Decreased balance  Visit Diagnosis: Other abnormalities of gait and mobility - Plan: PT plan of care cert/re-cert  Unsteadiness on feet - Plan: PT plan of care cert/re-cert  History of falling - Plan: PT plan of care cert/re-cert  Muscle weakness (generalized) - Plan: PT plan of care cert/re-cert  Dizziness and giddiness - Plan: PT plan of care cert/re-cert     Problem List Patient Active Problem List   Diagnosis Date Noted  . Moderate episode of recurrent major depressive disorder ([place]) 02/09/2019  . 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 Shuffling gait 02/09/2019  . Balance problems 02/09/2019  . 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  . Orthostatic hypotension 02/01/2019  . Dizziness 02/01/2019  . Primary osteoarthritis of right foot 09/25/2018  . Snoring 09/25/2018  . Night terrors, adult 09/25/2018  . Multiple falls 09/25/2018  . Metatarsalgia of left foot 08/20/2018  . Chronic foot pain, right 08/20/2018  . Left hip pain 04/10/2017  . History of shingles 04/08/2017  . Adenomatous polyp 04/08/2017  . Pure  hypercholesterolemia 04/08/2017  . History of kidney stones 04/08/2017  . Essential hypertension 04/08/2017  . Depression, recurrent ([place]) 04/08/2017  . Anxiety 04/08/2017  . Greater trochanteric bursitis of left hip 04/08/2017       Laureen Abrahams, PT, DPT 02/15/19 10:59 AM     [facility] [place], [place], [address] Phone: [phone]   Fax:  [phone]  Name: Chelsey Estes MRN: [id number] Date of Birth: [birth year]

## 2019-02-18 ENCOUNTER — Ambulatory Visit (INDEPENDENT_AMBULATORY_CARE_PROVIDER_SITE_OTHER): Payer: Medicare Other | Admitting: Physical Therapy

## 2019-02-18 ENCOUNTER — Encounter: Payer: Self-pay | Admitting: Physical Therapy

## 2019-02-18 DIAGNOSIS — R2689 Other abnormalities of gait and mobility: Secondary | ICD-10-CM | POA: Diagnosis not present

## 2019-02-18 DIAGNOSIS — Z9181 History of falling: Secondary | ICD-10-CM

## 2019-02-18 DIAGNOSIS — R2681 Unsteadiness on feet: Secondary | ICD-10-CM

## 2019-02-18 DIAGNOSIS — R42 Dizziness and giddiness: Secondary | ICD-10-CM

## 2019-02-18 DIAGNOSIS — M6281 Muscle weakness (generalized): Secondary | ICD-10-CM | POA: Diagnosis not present

## 2019-02-18 NOTE — Patient Instructions (Signed)
Access Code: [email or web address]  URL: [email or web address]  Date: 02/18/2019  Prepared by: Faustino Congress   Exercises  Wide Stance with Eyes Closed on Foam Pad - 10 reps - 1 sets - 5 sec hold - 1x daily - 7x weekly  Wide Stance with Head Rotation on Foam Pad - 10 reps - 1 sets - 1x daily - 7x weekly  Wide Stance with Head Nods on Foam Pad - 10 reps - 1 sets - 1x daily - 7x weekly  Issued OTAGO warm-up and strengthening; see flowsheet for details.

## 2019-02-18 NOTE — Therapy (Signed)
[place], [place], [address] Phone: [phone]   Fax:  [phone]  Physical Therapy Treatment  Patient Details  Name: Chelsey Estes MRN: [id number] Date of Birth: [birth year] Referring Provider (PT): Donella Stade, [place]   Encounter Date: 02/18/2019  PT End of Session - 02/18/19 1616    Visit Number  2    Number of Visits  12    Date for PT Re-Evaluation  03/29/19    PT Start Time  8242    PT Stop Time  1614    PT Time Calculation (min)  41 min    Activity Tolerance  Patient tolerated treatment well    Behavior During Therapy  [facility] for tasks assessed/performed       Past Medical History:  Diagnosis Date  . Anxiety   . Depression   . Hyperlipidemia   . Hypertension     Past Surgical History:  Procedure Laterality Date  . ABDOMINAL HYSTERECTOMY    . ANTERIOR (CYSTOCELE) AND POSTERIOR REPAIR (RECTOCELE) WITH XENFORM GRAFT AND SACROSPINOUS FIXATION      There were no vitals filed for this visit.  Subjective Assessment - 02/18/19 1536    Subjective  doing pretty well, feels a little "staggering" today    Patient Stated Goals  walk without feeling lightheaded, improve balance    Currently in Pain?  No/denies             Vestibular Assessment - 02/18/19 1542      Vestibular Assessment   General Observation  no symptoms at rest      Oculomotor Exam   Oculomotor Alignment  Normal    Spontaneous  Absent    Gaze-induced   Absent    Smooth Pursuits  Intact    Saccades  Intact      Oculomotor Exam-Fixation Suppressed    Left Head Impulse  WNL    Right Head Impulse  (+)      Vestibulo-Ocular Reflex   VOR 1 Head Only (x 1 viewing)  WNL      Positional Testing   Sidelying Test  Sidelying Right;Sidelying Left    Horizontal Canal Testing  Horizontal Canal Right;Horizontal Canal Left      Sidelying Right   Sidelying Right Duration  none    Sidelying Right Symptoms  No  nystagmus      Sidelying Left   Sidelying Left Duration  none    Sidelying Left Symptoms  No nystagmus      Horizontal Canal Right   Horizontal Canal Right Duration  none    Horizontal Canal Right Symptoms  Normal      Horizontal Canal Left   Horizontal Canal Left Duration  none    Horizontal Canal Left Symptoms  Normal               [facility] Adult PT Treatment/Exercise - 02/18/19 1536      Exercises   Exercises  Knee/Hip      Knee/Hip Exercises: Aerobic   Recumbent Bike  L4 x 6 min          Balance Exercises - 02/18/19 1557      Balance Exercises: Standing   Standing Eyes Opened  Wide (BOA);Foam/compliant surface;Head turns    Standing Eyes Closed  Wide (BOA);Foam/compliant surface;5 reps;10 secs      OTAGO PROGRAM   Head Movements  Sitting;5 reps    Neck Movements  Sitting;5 reps    Back Extension  5  reps;Standing    Trunk Movements  Standing;5 reps    Ankle Movements  Sitting;10 reps    Knee Extensor  10 reps    Knee Flexor  10 reps    Hip ABductor  10 reps    Ankle Plantorflexors  20 reps, support    Ankle Dorsiflexors  20 reps, support        PT Education - 02/18/19 1616    Education Details  HEP    Person(s) Educated  Patient    Methods  Explanation;Demonstration;Handout    Comprehension  Verbalized understanding;Returned demonstration;Need further instruction          PT Long Term Goals - 02/15/19 1053      PT LONG TERM GOAL #1   Title  independent with HEP    Status  New    Target Date  03/29/19      PT LONG TERM GOAL #2   Title  verbalize understanding of fall prevention strategies     Status  New    Target Date  03/29/19      PT LONG TERM GOAL #3   Title  BERG score improved to >/= 50/56 for improved balance    Status  New    Target Date  03/29/19      PT LONG TERM GOAL #4   Title  improve DGI to >/= 20/24 for improved mobility and decreased fall risk.    Status  New    Target Date  03/29/19            Plan -  02/18/19 1616    Clinical Impression Statement  Pt negative for BPPV today and demonstrates decreased vestibular input with LOB on foam with EC today.  Pt issued HEP for decreased vestibular input, and initiated OTAGO balance program.  Will continue to benefit from PT to maximize function.    Personal Factors and Comorbidities  Age;Comorbidity 3+    Comorbidities  HTN, depression, anxiety    Examination-Activity Limitations  Stairs;Stand;Transfers;Locomotion Level    Examination-Participation Restrictions  Cleaning;Community Activity    Stability/Clinical Decision Making  Evolving/Moderate complexity    Rehab Potential  Good    PT Frequency  2x / week    PT Duration  6 weeks    PT Treatment/Interventions  ADLs/Self Care Home Management;Cryotherapy;Canalith Repostioning;Gait training;Stair training;Functional mobility training;Neuromuscular re-education;Balance training;Therapeutic exercise;Therapeutic activities;Patient/family education;Manual techniques;Vestibular    PT Next Visit Plan  establish HEP for balance/strength (?OTAGO), assess vertigo/vestibular input, may benefit from PWR moves    Consulted and Agree with Plan of Care  Patient       Patient will benefit from skilled therapeutic intervention in order to improve the following deficits and impairments:  Abnormal gait, Dizziness, Postural dysfunction, Decreased mobility, Decreased strength, Impaired flexibility, Decreased balance  Visit Diagnosis: Unsteadiness on feet  History of falling  Other abnormalities of gait and mobility  Muscle weakness (generalized)  Dizziness and giddiness     Problem List Patient Active Problem List   Diagnosis Date Noted  . Moderate episode of recurrent major depressive disorder ([place]) 02/09/2019  . 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 Shuffling gait 02/09/2019  . Balance problems 02/09/2019  . 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  . Orthostatic hypotension 02/01/2019  .  Dizziness 02/01/2019  . Primary osteoarthritis of right foot 09/25/2018  . Snoring 09/25/2018  . Night terrors, adult 09/25/2018  . Multiple falls 09/25/2018  . Metatarsalgia of left foot 08/20/2018  . Chronic foot pain, right 08/20/2018  . Left  hip pain 04/10/2017  . History of shingles 04/08/2017  . Adenomatous polyp 04/08/2017  . Pure hypercholesterolemia 04/08/2017  . History of kidney stones 04/08/2017  . Essential hypertension 04/08/2017  . Depression, recurrent ([place]) 04/08/2017  . Anxiety 04/08/2017  . Greater trochanteric bursitis of left hip 04/08/2017      Laureen Abrahams, PT, DPT 02/18/19 4:18 PM     [facility] [place], [place], [address] Phone: [phone]   Fax:  [phone]  Name: Chelsey Estes MRN: [id number] Date of Birth: 03/13/1941

## 2019-02-19 DIAGNOSIS — R270 Ataxia, unspecified: Secondary | ICD-10-CM | POA: Diagnosis not present

## 2019-02-19 DIAGNOSIS — R42 Dizziness and giddiness: Secondary | ICD-10-CM | POA: Diagnosis not present

## 2019-02-22 ENCOUNTER — Encounter: Payer: Self-pay | Admitting: Physical Therapy

## 2019-02-22 ENCOUNTER — Ambulatory Visit (INDEPENDENT_AMBULATORY_CARE_PROVIDER_SITE_OTHER): Payer: Medicare Other | Admitting: Physical Therapy

## 2019-02-22 DIAGNOSIS — R2689 Other abnormalities of gait and mobility: Secondary | ICD-10-CM | POA: Diagnosis not present

## 2019-02-22 DIAGNOSIS — R2681 Unsteadiness on feet: Secondary | ICD-10-CM | POA: Diagnosis not present

## 2019-02-22 DIAGNOSIS — R42 Dizziness and giddiness: Secondary | ICD-10-CM

## 2019-02-22 DIAGNOSIS — Z9181 History of falling: Secondary | ICD-10-CM

## 2019-02-22 DIAGNOSIS — M6281 Muscle weakness (generalized): Secondary | ICD-10-CM

## 2019-02-22 NOTE — Therapy (Signed)
[place], [place], [address] Phone: [phone]   Fax:  [phone]  Physical Therapy Treatment  Patient Details  Name: Chelsey Estes MRN: [id number] Date of Birth: September 09, 1941 Referring Provider (PT): Donella Stade, [place]   Encounter Date: 02/22/2019  PT End of Session - 02/22/19 1148    Visit Number  3    Number of Visits  12    Date for PT Re-Evaluation  03/29/19    PT Start Time  1100    PT Stop Time  1145    PT Time Calculation (min)  45 min    Activity Tolerance  Patient tolerated treatment well    Behavior During Therapy  [facility] for tasks assessed/performed       Past Medical History:  Diagnosis Date  . Anxiety   . Depression   . Hyperlipidemia   . Hypertension     Past Surgical History:  Procedure Laterality Date  . ABDOMINAL HYSTERECTOMY    . ANTERIOR (CYSTOCELE) AND POSTERIOR REPAIR (RECTOCELE) WITH XENFORM GRAFT AND SACROSPINOUS FIXATION      There were no vitals filed for this visit.  Subjective Assessment - 02/22/19 1110    Subjective  doing pretty well, feels a little "staggering" today.  had MRI; doctor wants carotid ultrasounds and transcranial doppler since MRA was denied.    Patient Stated Goals  walk without feeling lightheaded, improve balance    Currently in Pain?  No/denies                       [facility] Adult PT Treatment/Exercise - 02/22/19 1116      Knee/Hip Exercises: Aerobic   Nustep  L5 x 8 min        PWR [facility]) - 02/22/19 1146    PWR! exercises  Moves in standing    PWR! Up  x20    PWR! Rock  x10 bil    PWR! Twist  x10 bil    PWR Step  x10 bil    Comments  standing       Balance Exercises - 02/22/19 1117      OTAGO PROGRAM   Knee Bends  10 reps, support    Backwards Walking  Support    Walking and Turning Around  No assistive device    Sideways Walking  No assistive device    Tandem Stance  10 seconds, support    Tandem Walk   Support    One Leg Stand  10 seconds, support    Heel Walking  Support    Toe Walk  Support    Sit to Stand  10 reps, no support        PT Education - 02/22/19 1147    Education Details  completed OTAGO    Person(s) Educated  Patient    Methods  Explanation;Handout;Demonstration    Comprehension  Verbalized understanding;Returned demonstration;Need further instruction          PT Long Term Goals - 02/15/19 1053      PT LONG TERM GOAL #1   Title  independent with HEP    Status  New    Target Date  03/29/19      PT LONG TERM GOAL #2   Title  verbalize understanding of fall prevention strategies     Status  New    Target Date  03/29/19      PT LONG TERM GOAL #3   Title  BERG  score improved to >/= 50/56 for improved balance    Status  New    Target Date  03/29/19      PT LONG TERM GOAL #4   Title  improve DGI to >/= 20/24 for improved mobility and decreased fall risk.    Status  New    Target Date  03/29/19            Plan - 02/22/19 1206    Clinical Impression Statement  Pt reporting MD wants transcranial doppler and isn't sure if she will be able to complete as it must be completed at [facility] and pt with transportation difficulties.  Completed OTAGO balance program and initiated PWR! moves today as pt demonstrates small amplitude movements and would benefit from large amplitude exercise.  Will continue to benefit from PT to maximize function and decrease fall risk.    Personal Factors and Comorbidities  Age;Comorbidity 3+    Comorbidities  HTN, depression, anxiety    Examination-Activity Limitations  Stairs;Stand;Transfers;Locomotion Level    Examination-Participation Restrictions  Cleaning;Community Activity    Stability/Clinical Decision Making  Evolving/Moderate complexity    Rehab Potential  Good    PT Frequency  2x / week    PT Duration  6 weeks    PT Treatment/Interventions  ADLs/Self Care Home Management;Cryotherapy;Canalith Repostioning;Gait  training;Stair training;Functional mobility training;Neuromuscular re-education;Balance training;Therapeutic exercise;Therapeutic activities;Patient/family education;Manual techniques;Vestibular    PT Next Visit Plan  establish HEP for balance/strength (?OTAGO), assess vertigo/vestibular input, may benefit from PWR moves    Consulted and Agree with Plan of Care  Patient       Patient will benefit from skilled therapeutic intervention in order to improve the following deficits and impairments:  Abnormal gait, Dizziness, Postural dysfunction, Decreased mobility, Decreased strength, Impaired flexibility, Decreased balance  Visit Diagnosis: Unsteadiness on feet  History of falling  Other abnormalities of gait and mobility  Muscle weakness (generalized)  Dizziness and giddiness     Problem List Patient Active Problem List   Diagnosis Date Noted  . Moderate episode of recurrent major depressive disorder ([place]) 02/09/2019  . 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 Shuffling gait 02/09/2019  . Balance problems 02/09/2019  . 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  . Orthostatic hypotension 02/01/2019  . Dizziness 02/01/2019  . Primary osteoarthritis of right foot 09/25/2018  . Snoring 09/25/2018  . Night terrors, adult 09/25/2018  . Multiple falls 09/25/2018  . Metatarsalgia of left foot 08/20/2018  . Chronic foot pain, right 08/20/2018  . Left hip pain 04/10/2017  . History of shingles 04/08/2017  . Adenomatous polyp 04/08/2017  . Pure hypercholesterolemia 04/08/2017  . History of kidney stones 04/08/2017  . Essential hypertension 04/08/2017  . Depression, recurrent ([place]) 04/08/2017  . Anxiety 04/08/2017  . Greater trochanteric bursitis of left hip 04/08/2017       Laureen Abrahams, PT, DPT 02/22/19 12:08 PM      [facility] [place], [place], [address] Phone: [phone]   Fax:   [phone]  Name: Chelsey Estes MRN: [id number] Date of Birth: 09-15-1941

## 2019-02-25 ENCOUNTER — Ambulatory Visit (INDEPENDENT_AMBULATORY_CARE_PROVIDER_SITE_OTHER): Payer: Medicare Other | Admitting: Physical Therapy

## 2019-02-25 ENCOUNTER — Other Ambulatory Visit: Payer: Self-pay

## 2019-02-25 ENCOUNTER — Encounter: Payer: Self-pay | Admitting: Physical Therapy

## 2019-02-25 DIAGNOSIS — Z9181 History of falling: Secondary | ICD-10-CM | POA: Diagnosis not present

## 2019-02-25 DIAGNOSIS — R42 Dizziness and giddiness: Secondary | ICD-10-CM | POA: Diagnosis not present

## 2019-02-25 DIAGNOSIS — R2681 Unsteadiness on feet: Secondary | ICD-10-CM | POA: Diagnosis not present

## 2019-02-25 DIAGNOSIS — M6281 Muscle weakness (generalized): Secondary | ICD-10-CM | POA: Diagnosis not present

## 2019-02-25 DIAGNOSIS — R2689 Other abnormalities of gait and mobility: Secondary | ICD-10-CM

## 2019-02-25 NOTE — Therapy (Addendum)
[place], [place], [address] Phone: [phone]   Fax:  [phone]  Physical Therapy Treatment/Discharge  Patient Details  Name: Chelsey Estes MRN: [id number] Date of Birth: 25-Mar-1941 Referring Provider (PT): Donella Stade, [place]   Encounter Date: 02/25/2019  PT End of Session - 02/25/19 1153    Visit Number  4    Number of Visits  12    Date for PT Re-Evaluation  03/29/19    PT Start Time  1113    PT Stop Time  1154    PT Time Calculation (min)  41 min    Activity Tolerance  Patient tolerated treatment well    Behavior During Therapy  [facility] for tasks assessed/performed       Past Medical History:  Diagnosis Date  . Anxiety   . Depression   . Hyperlipidemia   . Hypertension     Past Surgical History:  Procedure Laterality Date  . ABDOMINAL HYSTERECTOMY    . ANTERIOR (CYSTOCELE) AND POSTERIOR REPAIR (RECTOCELE) WITH XENFORM GRAFT AND SACROSPINOUS FIXATION      There were no vitals filed for this visit.  Subjective Assessment - 02/25/19 1118    Subjective  received MRI results; doesn't think she will need the transcranial doppler.      Patient Stated Goals  walk without feeling lightheaded, improve balance    Currently in Pain?  No/denies                       [facility] Adult PT Treatment/Exercise - 02/25/19 1121      Knee/Hip Exercises: Stretches   Gastroc Stretch  Both;2 reps;30 seconds      Knee/Hip Exercises: Aerobic   Nustep  L5 x 8 min      Knee/Hip Exercises: Standing   Lateral Step Up  Both;10 reps;Hand Hold: 1;Step Height: 6"    Forward Step Up  Both;10 reps;Hand Hold: 1;Step Height: 6"        PWR [facility]) - 02/25/19 1137    PWR! exercises  Moves in sitting    PWR! Up  x20    PWR! Rock  x10 bil    PWR! Twist  x10 bil    PWR! Step  x10 bil    Comments  sitting       Balance Exercises - 02/25/19 1147      Balance Exercises: Standing   Wall Bumps   Hip    Wall Bumps-Hips  Eyes opened;Eyes closed;Anterior/posterior;10 reps    Stepping Strategy  Anterior;Posterior;Lateral;10 reps;UE support    Other Standing Exercises  heel/toe raise x 20 reps alternating with UE support             PT Long Term Goals - 02/15/19 1053      PT LONG TERM GOAL #1   Title  independent with HEP    Status  New    Target Date  03/29/19      PT LONG TERM GOAL #2   Title  verbalize understanding of fall prevention strategies     Status  New    Target Date  03/29/19      PT LONG TERM GOAL #3   Title  BERG score improved to >/= 50/56 for improved balance    Status  New    Target Date  03/29/19      PT LONG TERM GOAL #4   Title  improve DGI to >/= 20/24 for improved mobility and  decreased fall risk.    Status  New    Target Date  03/29/19            Plan - 02/25/19 1153    Clinical Impression Statement  Pt tolerated session well today with balance activities.  Overall progressing well with PT.  No episodes of dizziness since starting PT.    Personal Factors and Comorbidities  Age;Comorbidity 3+    Comorbidities  HTN, depression, anxiety    Examination-Activity Limitations  Stairs;Stand;Transfers;Locomotion Level    Examination-Participation Restrictions  Cleaning;Community Activity    Stability/Clinical Decision Making  Evolving/Moderate complexity    Rehab Potential  Good    PT Frequency  2x / week    PT Duration  6 weeks    PT Treatment/Interventions  ADLs/Self Care Home Management;Cryotherapy;Canalith Repostioning;Gait training;Stair training;Functional mobility training;Neuromuscular re-education;Balance training;Therapeutic exercise;Therapeutic activities;Patient/family education;Manual techniques;Vestibular    PT Next Visit Plan  establish HEP for balance/strength (?OTAGO), assess vertigo/vestibular input, may benefit from PWR moves    Consulted and Agree with Plan of Care  Patient       Patient will benefit from skilled  therapeutic intervention in order to improve the following deficits and impairments:  Abnormal gait, Dizziness, Postural dysfunction, Decreased mobility, Decreased strength, Impaired flexibility, Decreased balance  Visit Diagnosis: Unsteadiness on feet  History of falling  Other abnormalities of gait and mobility  Muscle weakness (generalized)  Dizziness and giddiness     Problem List Patient Active Problem List   Diagnosis Date Noted  . Moderate episode of recurrent major depressive disorder ([place]) 02/09/2019  . 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 Shuffling gait 02/09/2019  . Balance problems 02/09/2019  . 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  . Orthostatic hypotension 02/01/2019  . Dizziness 02/01/2019  . Primary osteoarthritis of right foot 09/25/2018  . Snoring 09/25/2018  . Night terrors, adult 09/25/2018  . Multiple falls 09/25/2018  . Metatarsalgia of left foot 08/20/2018  . Chronic foot pain, right 08/20/2018  . Left hip pain 04/10/2017  . History of shingles 04/08/2017  . Adenomatous polyp 04/08/2017  . Pure hypercholesterolemia 04/08/2017  . History of kidney stones 04/08/2017  . Essential hypertension 04/08/2017  . Depression, recurrent ([place]) 04/08/2017  . Anxiety 04/08/2017  . Greater trochanteric bursitis of left hip 04/08/2017      Laureen Abrahams, PT, DPT 02/25/19 11:55 AM     [facility] [place], [place], [address] Phone: [phone]   Fax:  [phone]  Name: Chelsey Estes MRN: [id number] Date of Birth: Dec 24, 1940     PHYSICAL THERAPY DISCHARGE SUMMARY  Visits from Start of Care: 4  Current functional level related to goals / functional outcomes: See above   Remaining deficits: Unknown, clinic closed due to COVID-19, pt did not return   Education / Equipment: HEP  Plan: Patient agrees to discharge.  Patient goals were not met. Patient is being  discharged due to not returning since the last visit.  ?????     Laureen Abrahams, PT, DPT 05/31/19 10:19 AM  [facility] [place], [place] [address]  [phone] (office) [phone] (fax)

## 2019-03-01 ENCOUNTER — Encounter: Payer: Medicare Other | Admitting: Physical Therapy

## 2019-03-05 ENCOUNTER — Telehealth: Payer: Self-pay | Admitting: Physical Therapy

## 2019-03-05 ENCOUNTER — Encounter: Payer: Medicare Other | Admitting: Physical Therapy

## 2019-03-05 NOTE — Telephone Encounter (Signed)
LVM for pt due to clinic closure to reschedule.  Advised to call office for any questions.  Araseli Sherry F Dejean Tribby, PT, DPT 03/05/19 8:20 PM 

## 2019-03-09 ENCOUNTER — Telehealth: Payer: Self-pay | Admitting: Physical Therapy

## 2019-03-09 NOTE — Telephone Encounter (Signed)
Called to follow up with patient about physical therapy due to clinic closure.  Pt reports she's doing well and continuing with home exercises.  At this time requests follow up next week and hopeful to schedule appt to continue PT in the clinic.  Laureen Abrahams, PT, DPT 03/09/19 10:44 AM

## 2019-03-15 ENCOUNTER — Other Ambulatory Visit: Payer: Self-pay

## 2019-03-15 ENCOUNTER — Ambulatory Visit (INDEPENDENT_AMBULATORY_CARE_PROVIDER_SITE_OTHER): Payer: Medicare Other | Admitting: Physician Assistant

## 2019-03-15 ENCOUNTER — Encounter: Payer: Self-pay | Admitting: Physician Assistant

## 2019-03-15 VITALS — BP 133/70 | Temp 97.5°F | Ht 60.0 in | Wt 151.0 lb

## 2019-03-15 DIAGNOSIS — M19071 Primary osteoarthritis, right ankle and foot: Secondary | ICD-10-CM

## 2019-03-15 DIAGNOSIS — D352 Benign neoplasm of pituitary gland: Secondary | ICD-10-CM | POA: Insufficient documentation

## 2019-03-15 DIAGNOSIS — F419 Anxiety disorder, unspecified: Secondary | ICD-10-CM | POA: Diagnosis not present

## 2019-03-15 DIAGNOSIS — F339 Major depressive disorder, recurrent, unspecified: Secondary | ICD-10-CM | POA: Diagnosis not present

## 2019-03-15 MED ORDER — DICLOFENAC SODIUM 1 % TD GEL: 4.0000 g | Freq: Four times a day (QID) | TRANSDERMAL | 1 refills | Status: DC

## 2019-03-15 MED ORDER — BUPROPION HCL ER (XL) 300 MG PO TB24: ORAL_TABLET | ORAL | 1 refills | Status: DC

## 2019-03-15 NOTE — Progress Notes (Signed)
..Virtual Visit via Telephone Note  I connected with Chelsey Estes on 03/15/19 at 10:50 AM EDT by telephone and verified that I am speaking with the correct person using two identifiers.   I discussed the limitations, risks, security and privacy concerns of performing an evaluation and management service by telephone and the availability of in person appointments. I also discussed with the patient that there may be a patient responsible charge related to this service. The patient expressed understanding and agreed to proceed.   History of Present Illness: Pt is a 78 yo female with HTN, orthostatic hypotension, balance problems who calls in for follow up.   She has not been checking BP but elevated at last visit. She will get reading and call in later.   She does feel like dizziness and balance are better. She liked PT. She also stopped ativan. She feels more stable on her feet.   Overall she feels like anxiety and depression are better. She has teletherapy set up for tomorrow.   She is seeing neurology and working up a pitutary adenoma.    Never got pennsaid for her knee pain. Would like something for knee pain.   .. Active Ambulatory Problems    Diagnosis Date Noted  . History of shingles 04/08/2017  . Adenomatous polyp 04/08/2017  . Pure hypercholesterolemia 04/08/2017  . History of kidney stones 04/08/2017  . Essential hypertension 04/08/2017  . Depression, recurrent ([place]) 04/08/2017  . Anxiety 04/08/2017  . Greater trochanteric bursitis of left hip 04/08/2017  . Left hip pain 04/10/2017  . Metatarsalgia of left foot 08/20/2018  . Chronic foot pain, right 08/20/2018  . Primary osteoarthritis of right foot 09/25/2018  . Snoring 09/25/2018  . Night terrors, adult 09/25/2018  . Multiple falls 09/25/2018  . Orthostatic hypotension 02/01/2019  . Dizziness 02/01/2019  . Moderate episode of recurrent major depressive disorder ([place]) 02/09/2019  . 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 Shuffling gait 02/09/2019  . Balance problems 02/09/2019  . 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  . 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 No Resolved Ambulatory Problems   Past Medical History:  Diagnosis Date  . Depression   . Hyperlipidemia   . Hypertension    Reviewed med, allergy, problem list.    Observations/Objective: No acute distress.   .. Today's Vitals   03/15/19 1042  Temp: (!) 97.5 F (36.4 C)  TempSrc: Oral  Weight: 151 lb (68.5 kg)  Height: 5' (1.524 m)   Body mass index is 29.49 kg/m.  .. Depression screen [facility] 2/9 03/15/2019 02/08/2019 01/25/2019 03/26/2018 10/08/2017  Decreased Interest 2 2 1 1 1   Down, Depressed, Hopeless 1 2 2 1  0  PHQ - 2 Score 3 4 3 2 1   Altered sleeping 1 2 1 1  -  Tired, decreased energy 1 2 1 1  -  Change in appetite 0 0 1 - -  Feeling bad or failure about yourself  0 0 0 0 -  Trouble concentrating 0 0 0 0 -  Moving slowly or fidgety/restless 0 2 2 0 -  Suicidal thoughts 0 0 0 0 -  PHQ-9 Score 5 10 8 4  -  Difficult doing work/chores Not difficult at all Not difficult at all Somewhat difficult Somewhat difficult -   .. GAD 7 : Generalized Anxiety Score 03/15/2019 02/08/2019 01/25/2019 03/26/2018  Nervous, Anxious, on Edge 1 2 2 1   Control/stop worrying 2 2 2  1  Worry too much - different things 0 2 2 1   Trouble relaxing 0 0 2 0  Restless 0 0 2 0  Easily annoyed or irritable 1 1 2 1   Afraid - awful might happen 0 1 2 0  Total GAD 7 Score 4 8 14 4   Anxiety Difficulty Not difficult at all Somewhat difficult Somewhat difficult Somewhat difficult     Assessment and Plan: Marland KitchenMarland KitchenDezeray was seen today for follow-up.  Diagnoses and all orders for this visit:  Primary osteoarthritis of right foot -     diclofenac sodium (VOLTAREN) 1 % GEL; Apply 4 g topically 4 (four) times daily. To affected joint.  Depression, recurrent (HCC) -     buPROPion  (WELLBUTRIN XL) 300 MG 24 hr tablet; TAKE 1 TABLET(300 MG) BY MOUTH DAILY.  Anxiety -     buPROPion (WELLBUTRIN XL) 300 MG 24 hr tablet; TAKE 1 TABLET(300 MG) BY MOUTH DAILY.  Pituitary microadenoma ([place])  she will call back with bP to make sure lisinopril is controlling BP.   She feels like her dizziness and balance have improved with PT and d/c ativan.  MRI did show pituitary microadenoma. Neurologist is following up with [facility].   Her anxiety and depression have actually improved. She is off ativan which is great. She has a teletherapy visit tomorrow with Janett Billow in the office. Continue to walk on pretty days to get out of house. COVID pandemic was discussed and ways to keep mood up. Follow up as needed.   Never got pennsaid gel. Will try voltaren.       Follow Up Instructions:    I discussed the assessment and treatment plan with the patient. The patient was provided an opportunity to ask questions and all were answered. The patient agreed with the plan and demonstrated an understanding of the instructions.   The patient was advised to call back or seek an in-person evaluation if the symptoms worsen or if the condition fails to improve as anticipated.  I provided 15 minutes of non-face-to-face time during this encounter.   Iran Planas, PA-C

## 2019-03-16 ENCOUNTER — Ambulatory Visit: Payer: Medicare Other | Admitting: Psychology

## 2019-03-16 ENCOUNTER — Ambulatory Visit (INDEPENDENT_AMBULATORY_CARE_PROVIDER_SITE_OTHER): Payer: Medicare Other | Admitting: Psychology

## 2019-03-16 DIAGNOSIS — F339 Major depressive disorder, recurrent, unspecified: Secondary | ICD-10-CM | POA: Diagnosis not present

## 2019-03-22 ENCOUNTER — Ambulatory Visit (INDEPENDENT_AMBULATORY_CARE_PROVIDER_SITE_OTHER): Payer: Medicare Other | Admitting: Psychology

## 2019-03-22 DIAGNOSIS — F339 Major depressive disorder, recurrent, unspecified: Secondary | ICD-10-CM

## 2019-03-29 ENCOUNTER — Ambulatory Visit (INDEPENDENT_AMBULATORY_CARE_PROVIDER_SITE_OTHER): Payer: Medicare Other | Admitting: Psychology

## 2019-03-29 DIAGNOSIS — F339 Major depressive disorder, recurrent, unspecified: Secondary | ICD-10-CM

## 2019-04-13 ENCOUNTER — Ambulatory Visit (INDEPENDENT_AMBULATORY_CARE_PROVIDER_SITE_OTHER): Payer: Medicare Other | Admitting: Psychology

## 2019-04-13 DIAGNOSIS — F339 Major depressive disorder, recurrent, unspecified: Secondary | ICD-10-CM | POA: Diagnosis not present

## 2019-04-15 ENCOUNTER — Other Ambulatory Visit: Payer: Self-pay | Admitting: Physician Assistant

## 2019-04-16 ENCOUNTER — Ambulatory Visit (INDEPENDENT_AMBULATORY_CARE_PROVIDER_SITE_OTHER): Payer: Medicare Other | Admitting: Physician Assistant

## 2019-04-16 ENCOUNTER — Encounter: Payer: Self-pay | Admitting: Physician Assistant

## 2019-04-16 VITALS — BP 135/76 | HR 77 | Temp 98.0°F | Ht 61.0 in | Wt 152.0 lb

## 2019-04-16 DIAGNOSIS — F331 Major depressive disorder, recurrent, moderate: Secondary | ICD-10-CM

## 2019-04-16 DIAGNOSIS — I10 Essential (primary) hypertension: Secondary | ICD-10-CM

## 2019-04-16 DIAGNOSIS — R2689 Other abnormalities of gait and mobility: Secondary | ICD-10-CM | POA: Diagnosis not present

## 2019-04-16 DIAGNOSIS — F419 Anxiety disorder, unspecified: Secondary | ICD-10-CM

## 2019-04-16 MED ORDER — LISINOPRIL 5 MG PO TABS: 5.0000 mg | ORAL_TABLET | Freq: Two times a day (BID) | ORAL | 1 refills | Status: DC

## 2019-04-16 NOTE — Progress Notes (Deleted)
-  135/76 pulse 78 was re-take 1 hour after readings in vital section  -takes Lisinopril in the AM  -120s/60 in the AM but in the PM 150s/70s, states face gets very flushed in the afternoon and that's what causes her to recheck BP  -flushing started beginning of April

## 2019-04-16 NOTE — Progress Notes (Signed)
Patient ID: Chelsey Estes, female   DOB: 02-22-1941, 78 y.o.   MRN: [id number] .Marland KitchenVirtual Visit via Video Note  I connected with Chelsey Estes on 04/19/19 at  8:50 AM EDT by a video enabled telemedicine application and verified that I am speaking with the correct person using two identifiers.  Location: Patient: home Provider: home   I discussed the limitations of evaluation and management by telemedicine and the availability of in person appointments. The patient expressed understanding and agreed to proceed.  History of Present Illness: Pt is a 78 yo female with HTN, balance problems, MDD, GAD who presents to the clinic for medication follow up.   Pt taking lisinopril in am and when she checks BP running in the 120's over 60's then in the evenings running 150's/70's. When it is elevated she does get flushed. No other problems. No CP.   Balance issues are improving with PT.   voltaren is helping her aches and pains.   Anxiety and depression much better with counseling. She loves [place] alcoholic husband.   .. Active Ambulatory Problems    Diagnosis Date Noted  . History of shingles 04/08/2017  . Adenomatous polyp 04/08/2017  . Pure hypercholesterolemia 04/08/2017  . History of kidney stones 04/08/2017  . Essential hypertension 04/08/2017  . Depression, recurrent ([place]) 04/08/2017  . Anxiety 04/08/2017  . Greater trochanteric bursitis of left hip 04/08/2017  . Left hip pain 04/10/2017  . Metatarsalgia of left foot 08/20/2018  . Chronic foot pain, right 08/20/2018  . Primary osteoarthritis of right foot 09/25/2018  . Snoring 09/25/2018  . Night terrors, adult 09/25/2018  . Multiple falls 09/25/2018  . Orthostatic hypotension 02/01/2019  . Dizziness 02/01/2019  . Moderate episode of recurrent major depressive disorder ([place]) 02/09/2019  . 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 Shuffling gait 02/09/2019  . Balance problems 02/09/2019  . 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   . 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 No Resolved Ambulatory Problems   Past Medical History:  Diagnosis Date  . Depression   . Hyperlipidemia   . Hypertension    Reviewed med, allergy, problem list.       Observations/Objective: No acute distress. No labored breathing. Normal mood.   .. Today's Vitals   04/16/19 0837 04/16/19 0943  BP: (!) 146/84 135/76  Pulse: 77   Temp: 98 F (36.7 C)   TempSrc: Oral   Weight: 152 lb (68.9 kg)   Height: 5\' 1"  (1.549 m)    Body mass index is 28.72 kg/m.   Assessment and Plan: Marland KitchenMarland KitchenMakinsley was seen today for hypertension.  Diagnoses and all orders for this visit:  Essential hypertension -     lisinopril (ZESTRIL) 5 MG tablet; Take 1 tablet (5 mg total) by mouth 2 (two) times daily.  Depression, recurrent ([place])  Anxiety  Balance problems  Moderate episode of recurrent major depressive disorder ([place])  so glad counseling is helping so much. Continue with therapy.   BP seems to be elevating in the evening. 5mg  twice a day for BP control. Keep log recheck in 2 weeks.   Continue with PT and home exercises for stability.    Follow Up Instructions:    I discussed the assessment and treatment plan with the patient. The patient was provided an opportunity to ask questions and all were answered. The patient agreed with the plan and demonstrated an understanding of the instructions.   The patient was advised  to call back or seek an in-person evaluation if the symptoms worsen or if the condition fails to improve as anticipated.  I provided 25 minutes of non-face-to-face time during this encounter.   Iran Planas, PA-C

## 2019-04-23 ENCOUNTER — Ambulatory Visit (INDEPENDENT_AMBULATORY_CARE_PROVIDER_SITE_OTHER): Payer: Medicare Other | Admitting: Psychology

## 2019-04-23 DIAGNOSIS — F339 Major depressive disorder, recurrent, unspecified: Secondary | ICD-10-CM

## 2019-05-04 ENCOUNTER — Ambulatory Visit (INDEPENDENT_AMBULATORY_CARE_PROVIDER_SITE_OTHER): Payer: Medicare Other | Admitting: Psychology

## 2019-05-04 DIAGNOSIS — F339 Major depressive disorder, recurrent, unspecified: Secondary | ICD-10-CM | POA: Diagnosis not present

## 2019-05-11 ENCOUNTER — Ambulatory Visit (INDEPENDENT_AMBULATORY_CARE_PROVIDER_SITE_OTHER): Payer: Medicare Other | Admitting: Psychology

## 2019-05-11 DIAGNOSIS — F339 Major depressive disorder, recurrent, unspecified: Secondary | ICD-10-CM | POA: Diagnosis not present

## 2019-05-25 ENCOUNTER — Ambulatory Visit (INDEPENDENT_AMBULATORY_CARE_PROVIDER_SITE_OTHER): Payer: Medicare Other | Admitting: Psychology

## 2019-05-25 DIAGNOSIS — F339 Major depressive disorder, recurrent, unspecified: Secondary | ICD-10-CM | POA: Diagnosis not present

## 2019-06-03 ENCOUNTER — Ambulatory Visit (INDEPENDENT_AMBULATORY_CARE_PROVIDER_SITE_OTHER): Payer: Medicare Other | Admitting: Psychology

## 2019-06-03 DIAGNOSIS — F339 Major depressive disorder, recurrent, unspecified: Secondary | ICD-10-CM

## 2019-06-08 ENCOUNTER — Ambulatory Visit: Payer: Medicare Other | Admitting: Psychology

## 2019-06-09 NOTE — Progress Notes (Signed)
Subjective:   Chelsey Estes is a 78 y.o. female who presents for an Initial Medicare Annual Wellness Visit.  Review of Systems    No ROS.  Medicare Wellness Virtual Visit.  Visual/audio telehealth visit, UTA vital signs.   See social history for additional risk factors.     Cardiac Risk Factors include: dyslipidemia;hypertension;sedentary lifestyle;advanced age (>65men, >60 women) Sleep patterns: Getting 6 hours of sleep a night. Wakes up 1 times to void during the night. Wakes up and feels refreshed Home Safety/Smoke Alarms: Feels safe in home. Smoke alarms in place.  Living environment; Lives with husband in 1 story home, steps have hand rails on them. Shower is a walk in shower and no grab bars in place or mat. Seat Belt Safety/Bike Helmet: Wears seat belt.   Female:   Pap- Aged out      Mammo- UTD      Dexa scan-   Ordered while in office     CCS- Aged out      Objective:    Today's Vitals   06/15/19 1021  BP: (!) 142/81  Pulse: 75  Temp: 98 F (36.7 C)  TempSrc: Oral  Weight: 152 lb (68.9 kg)  Height: 5\' 1"  (1.549 m)   Body mass index is 28.72 kg/m.  Advanced Directives 06/15/2019 02/15/2019 04/08/2017  Does Patient Have a Medical Advance Directive? No No No  Would patient like information on creating a medical advance directive? No - Patient declined Yes (MAU/Ambulatory/Procedural Areas - Information given) No - Patient declined    Current Medications (verified) Outpatient Encounter Medications as of 06/15/2019  Medication Sig  . ALPHAGAN P 0.1 % SOLN   . buPROPion (WELLBUTRIN XL) 300 MG 24 hr tablet TAKE 1 TABLET(300 MG) BY MOUTH DAILY.  . cyanocobalamin 1000 MCG tablet Take 2,000 mcg by mouth daily.  . diclofenac sodium (VOLTAREN) 1 % GEL Apply 4 g topically 4 (four) times daily. To affected joint.  Marland Kitchen lisinopril (PRINIVIL,ZESTRIL) 10 MG tablet TAKE 1 TABLET(10 MG) BY MOUTH DAILY  . Melatonin 5 MG CHEW Chew 2 tablets by mouth at bedtime.  . simvastatin  (ZOCOR) 10 MG tablet Take 1 tablet (10 mg total) by mouth daily.  . timolol (TIMOPTIC) 0.5 % ophthalmic solution   . lisinopril (ZESTRIL) 5 MG tablet Take 1 tablet (5 mg total) by mouth 2 (two) times daily. (Patient not taking: Reported on 06/15/2019)   No facility-administered encounter medications on file as of 06/15/2019.     Allergies (verified) Patient has no known allergies.   History: Past Medical History:  Diagnosis Date  . Anxiety   . Depression   . Hyperlipidemia   . Hypertension   . Kidney stone   . Kidney stone    Past Surgical History:  Procedure Laterality Date  . ABDOMINAL HYSTERECTOMY    . ANTERIOR (CYSTOCELE) AND POSTERIOR REPAIR (RECTOCELE) WITH XENFORM GRAFT AND SACROSPINOUS FIXATION     Family History  Problem Relation Age of Onset  . Alcohol abuse Father   . Cancer Father        esophageal  . Stroke Maternal Grandfather   . Alcohol abuse Paternal Grandfather    Social History   Socioeconomic History  . Marital status: Married    Spouse name: Ron  . Number of children: 2  . Years of education: 83  . Highest education level: Some college, no degree  Occupational History  . Occupation: Educational psychologist- Psychologist, prison and probation services    Comment: retired  Scientific laboratory technician  .  Financial resource strain: Not hard at all  . Food insecurity    Worry: Never true    Inability: Never true  . Transportation needs    Medical: No    Non-medical: No  Tobacco Use  . Smoking status: Never Smoker  . Smokeless tobacco: Never Used  Substance and Sexual Activity  . Alcohol use: No  . Drug use: No  . Sexual activity: Not Currently  Lifestyle  . Physical activity    Days per week: 5 days    Minutes per session: 30 min  . Stress: Not at all  Relationships  . Social connections    Talks on phone: More than three times a week    Gets together: Once a week    Attends religious service: Never    Active member of club or organization: No    Attends meetings of clubs  or organizations: Never    Relationship status: Married  Other Topics Concern  . Not on file  Social History Narrative  . Not on file    Tobacco Counseling Counseling given: No   Clinical Intake:  Pre-visit preparation completed: Yes  Pain : No/denies pain     Nutritional Risks: None Diabetes: No  How often do you need to have someone help you when you read instructions, pamphlets, or other written materials from your doctor or pharmacy?: 1 - Never What is the last grade level you completed in school?: 13  Interpreter Needed?: No  Information entered by :: Orlie Dakin LPN   Activities of Daily Living In your present state of health, do you have any difficulty performing the following activities: 06/15/2019  Hearing? N  Vision? N  Difficulty concentrating or making decisions? N  Walking or climbing stairs? Y  Dressing or bathing? N  Doing errands, shopping? N  Preparing Food and eating ? N  Using the Toilet? N  In the past six months, have you accidently leaked urine? N  Do you have problems with loss of bowel control? N  Managing your Medications? N  Managing your Finances? N  Housekeeping or managing your Housekeeping? N  Some recent data might be hidden     Immunizations and Health Maintenance Immunization History  Administered Date(s) Administered  . Influenza-Unspecified 09/15/2018  . Pneumococcal Polysaccharide-23 09/25/2018  . Tdap 10/08/2017   Health Maintenance Due  Topic Date Due  . MAMMOGRAM  04/18/2019    Patient Care Team: [name] as PCP - General (Family Medicine)  Indicate any recent Medical Services you may have received from other than [facility] providers in the past year (date may be approximate).     Assessment:   This is a routine wellness examination for Chelsey Estes.Physical assessment deferred to PCP.   Hearing/Vision screen No exam data present  Dietary issues and exercise activities discussed: Current Exercise  Habits: Home exercise routine, Type of exercise: stretching;walking, Time (Minutes): 30, Frequency (Times/Week): 5, Weekly Exercise (Minutes/Week): 150, Intensity: Mild, Exercise limited by: None identified Diet Eats fairly healthy. States does take in a lot of sugar. Advised to dewcrease sugar intake. Breakfast:oatmeal, cereal boiled egg Lunch: cottage cheese and pineapple Dinner:  Meat and vegetables or fast food, or salads     Goals    . Weight (lb) < 200 lb (90.7 kg)     Wants to loose 10 lbs      Depression Screen PHQ 2/9 Scores 06/15/2019 03/15/2019 02/08/2019 01/25/2019 03/26/2018 10/08/2017 04/08/2017  PHQ - 2 Score 1 3 4  3  2 1 2   PHQ- 9 Score - 5 10 8 4  - 6    Fall Risk Fall Risk  06/15/2019 01/25/2019  Falls in the past year? 1 0  Number falls in past yr: 0 0  Injury with Fall? 0 0  Risk for fall due to : Impaired balance/gait Impaired balance/gait  Follow up Falls prevention discussed Falls prevention discussed    Is the patient's home free of loose throw rugs in walkways, pet beds, electrical cords, etc?   yes      Grab bars in the bathroom? no      Handrails on the stairs?   yes      Adequate lighting?   yes  Cognitive Function:     6CIT Screen 06/15/2019  What Year? 0 points  What month? 0 points  What time? 0 points  Count back from 20 0 points  Months in reverse 0 points  Repeat phrase 0 points  Total Score 0    Screening Tests Health Maintenance  Topic Date Due  . MAMMOGRAM  04/18/2019  . INFLUENZA VACCINE  07/17/2019  . PNA vac Low Risk Adult (2 of 2 - PCV13) 09/26/2019  . TETANUS/TDAP  10/09/2027  . DEXA SCAN  Completed      Plan:    Ms. Donn , Thank you for taking time to come for your Medicare Wellness Visit. I appreciate your ongoing commitment to your health goals. Please review the following plan we discussed and let me know if I can assist you in the future.  Please schedule your next medicare wellness visit with me in 1 yr. Continue  doing brain stimulating activities (puzzles, reading, adult coloring books, staying active) to keep memory sharp.    These are the goals we discussed: Goals    . Weight (lb) < 200 lb (90.7 kg)     Wants to loose 10 lbs       This is a list of the screening recommended for you and due dates:  Health Maintenance  Topic Date Due  . Mammogram  04/18/2019  . Flu Shot  07/17/2019  . Pneumonia vaccines (2 of 2 - PCV13) 09/26/2019  . Tetanus Vaccine  10/09/2027  . DEXA scan (bone density measurement)  Completed     I have personally reviewed and noted the following in the patient's chart:   . Medical and social history . Use of alcohol, tobacco or illicit drugs  . Current medications and supplements . Functional ability and status . Nutritional status . Physical activity . Advanced directives . List of other physicians . Hospitalizations, surgeries, and ER visits in previous 12 months . Vitals . Screenings to include cognitive, depression, and falls . Referrals and appointments  In addition, I have reviewed and discussed with patient certain preventive protocols, quality metrics, and best practice recommendations. A written personalized care plan for preventive services as well as general preventive health recommendations were provided to patient.     Joanne Chars, LPN   02/28/4007

## 2019-06-15 ENCOUNTER — Ambulatory Visit (INDEPENDENT_AMBULATORY_CARE_PROVIDER_SITE_OTHER): Payer: Medicare Other | Admitting: *Deleted

## 2019-06-15 VITALS — BP 142/81 | HR 75 | Temp 98.0°F | Ht 61.0 in | Wt 152.0 lb

## 2019-06-15 DIAGNOSIS — Z0000 Encounter for general adult medical examination without abnormal findings: Secondary | ICD-10-CM | POA: Diagnosis not present

## 2019-06-15 DIAGNOSIS — Z13820 Encounter for screening for osteoporosis: Secondary | ICD-10-CM | POA: Diagnosis not present

## 2019-06-15 DIAGNOSIS — Z780 Asymptomatic menopausal state: Secondary | ICD-10-CM

## 2019-06-15 NOTE — Patient Instructions (Addendum)
[name] , Thank you for taking time to come for your Medicare Wellness Visit. I appreciate your ongoing commitment to your health goals. Please review the following plan we discussed and let me know if I can assist you in the future.  Please schedule your next medicare wellness visit with me in 1 yr. Continue doing brain stimulating activities (puzzles, reading, adult coloring books, staying active) to keep memory sharp. These are the goals we discussed: Goals    . Weight (lb) < 200 lb (90.7 kg)     Wants to loose 10 lbs

## 2019-06-16 ENCOUNTER — Other Ambulatory Visit: Payer: Self-pay | Admitting: Physician Assistant

## 2019-06-16 ENCOUNTER — Ambulatory Visit (INDEPENDENT_AMBULATORY_CARE_PROVIDER_SITE_OTHER): Payer: Medicare Other | Admitting: Physician Assistant

## 2019-06-16 VITALS — BP 142/81 | HR 75 | Temp 98.0°F | Ht 61.0 in | Wt 152.0 lb

## 2019-06-16 DIAGNOSIS — F331 Major depressive disorder, recurrent, moderate: Secondary | ICD-10-CM

## 2019-06-16 DIAGNOSIS — M4000 Postural kyphosis, site unspecified: Secondary | ICD-10-CM | POA: Diagnosis not present

## 2019-06-16 DIAGNOSIS — I10 Essential (primary) hypertension: Secondary | ICD-10-CM

## 2019-06-16 DIAGNOSIS — Z1231 Encounter for screening mammogram for malignant neoplasm of breast: Secondary | ICD-10-CM

## 2019-06-16 DIAGNOSIS — M545 Low back pain, unspecified: Secondary | ICD-10-CM

## 2019-06-16 DIAGNOSIS — R296 Repeated falls: Secondary | ICD-10-CM | POA: Diagnosis not present

## 2019-06-16 MED ORDER — MELOXICAM 15 MG PO TABS: 15.0000 mg | ORAL_TABLET | Freq: Every day | ORAL | 1 refills | Status: DC

## 2019-06-16 MED ORDER — LISINOPRIL 5 MG PO TABS: 5.0000 mg | ORAL_TABLET | Freq: Two times a day (BID) | ORAL | 1 refills | Status: DC

## 2019-06-16 MED ORDER — CYCLOBENZAPRINE HCL 5 MG PO TABS: 5.0000 mg | ORAL_TABLET | Freq: Three times a day (TID) | ORAL | 0 refills | Status: DC

## 2019-06-16 NOTE — Progress Notes (Signed)
Patient ID: Chelsey Estes, female   DOB: January 23, 1941, 78 y.o.   MRN: [id number] .Marland KitchenVirtual Visit via Video Note  I connected with Chelsey Estes on 06/20/19 at 10:50 AM EDT by a video enabled telemedicine application and verified that I am speaking with the correct person using two identifiers.  Location: Patient: home Provider: clinic   I discussed the limitations of evaluation and management by telemedicine and the availability of in person appointments. The patient expressed understanding and agreed to proceed.  History of Present Illness: Pt is a 78 yo female with MDD, HTN who presents to the clinic for follow up.   She wants to make sure her blood pressure medication is where it needs to be. She takes lisinopril twice a day. She has readings mostly under 140/90. She has had a few over 160. No CP, palpitations.        She has fallen twice but both times her dog or the steps causes her to fall she was not dizzy. She landed on her knee and elbow.  She has some ongoing balance problems. She has been to PT for balance training. Since the fall her low back has ached. She denies any radiation of pain down her legs. No saddle anesthesia or bowel or bladder dysfunction.   In the past she has had some low back pain and mobic worked really well. She wonders if she could have another rx.   She is having good and bad days with mood. COVID pandemic causes some extra worry and not being able to get out as much and see all the people she loves sometimes brings her down. Overall she is much better. She is in counseling every 2 weeks which helps a lot.    Observations/Objective: No acute distress. Normal breathing.  Normal mood.  Able to ambulate without any problem.  ROM at waist is decreased due to stiffness and pain.   .. Depression screen [facility] 2/9 06/15/2019 03/15/2019 02/08/2019 01/25/2019 03/26/2018  Decreased Interest 0 2 2 1 1   Down, Depressed, Hopeless 1 1 2 2 1   PHQ - 2 Score 1 3 4 3 2   Altered  sleeping - 1 2 1 1   Tired, decreased energy - 1 2 1 1   Change in appetite - 0 0 1 -  Feeling bad or failure about yourself  - 0 0 0 0  Trouble concentrating - 0 0 0 0  Moving slowly or fidgety/restless - 0 2 2 0  Suicidal thoughts - 0 0 0 0  PHQ-9 Score - 5 10 8 4   Difficult doing work/chores - Not difficult at all Not difficult at all Somewhat difficult Somewhat difficult    Assessment and Plan: Marland KitchenMarland KitchenKonner was seen today for hypertension and back pain.  Diagnoses and all orders for this visit:  Essential hypertension -     lisinopril (ZESTRIL) 5 MG tablet; Take 1 tablet (5 mg total) by mouth 2 (two) times daily.  Kyphosis (acquired) (postural)  Visit for screening mammogram -     MM 3D SCREEN BREAST BILATERAL  Acute bilateral low back pain without sciatica -     cyclobenzaprine (FLEXERIL) 5 MG tablet; Take 1 tablet (5 mg total) by mouth 3 (three) times daily. -     AMBULATORY NON FORMULARY MEDICATION; Flex it tens unit as needed for low back pain  Moderate episode of recurrent major depressive disorder (HCC)  Multiple falls  Other orders -     Discontinue: meloxicam (MOBIC) 15 MG tablet; Take  1 tablet (15 mg total) by mouth daily.  discussed BP goal for above 60 which is 150/90. Most of the time she is in that range. Refilled lisinopril.   Discussed conservative treatment for low back pain. Encouraged icy hot patches. Prescribed tens units to use as needed. Discussed low back exercises. [place] for [employer]. Kidney function looks good. No hx of ulcers. Discussed side effects of mobic. Only use as needed and take with food. Follow up as needed.   Discussed good balance routine. Discussed working every day on core exercises.   Continue with counseling. Continue on same medications. Follow up as needed with mood.   Mammogram ordered.   Follow up in 6  months.   Follow Up Instructions:    I discussed the assessment and treatment plan with the patient. The patient was provided an  opportunity to ask questions and all were answered. The patient agreed with the plan and demonstrated an understanding of the instructions.   The patient was advised to call back or seek an in-person evaluation if the symptoms worsen or if the condition fails to improve as anticipated.   Iran Planas, PA-C

## 2019-06-16 NOTE — Progress Notes (Signed)
Patient wants to discuss blood pressure medication options, states it has been running high.   She also had a fall and having some back pain, wants to discuss starting back on meloxicam which she used years ago.

## 2019-06-17 ENCOUNTER — Ambulatory Visit (INDEPENDENT_AMBULATORY_CARE_PROVIDER_SITE_OTHER): Payer: Medicare Other | Admitting: Psychology

## 2019-06-17 DIAGNOSIS — F339 Major depressive disorder, recurrent, unspecified: Secondary | ICD-10-CM

## 2019-06-20 MED ORDER — AMBULATORY NON FORMULARY MEDICATION: 5 refills | Status: DC

## 2019-07-01 ENCOUNTER — Ambulatory Visit: Payer: Medicare Other | Admitting: Psychology

## 2019-07-01 DIAGNOSIS — I6523 Occlusion and stenosis of bilateral carotid arteries: Secondary | ICD-10-CM | POA: Diagnosis not present

## 2019-07-01 DIAGNOSIS — R42 Dizziness and giddiness: Secondary | ICD-10-CM | POA: Diagnosis not present

## 2019-07-01 DIAGNOSIS — R270 Ataxia, unspecified: Secondary | ICD-10-CM | POA: Diagnosis not present

## 2019-07-15 ENCOUNTER — Ambulatory Visit (INDEPENDENT_AMBULATORY_CARE_PROVIDER_SITE_OTHER): Payer: Medicare Other | Admitting: Psychology

## 2019-07-15 DIAGNOSIS — F339 Major depressive disorder, recurrent, unspecified: Secondary | ICD-10-CM

## 2019-07-29 ENCOUNTER — Ambulatory Visit (INDEPENDENT_AMBULATORY_CARE_PROVIDER_SITE_OTHER): Payer: Medicare Other | Admitting: Psychology

## 2019-07-29 DIAGNOSIS — F339 Major depressive disorder, recurrent, unspecified: Secondary | ICD-10-CM | POA: Diagnosis not present

## 2019-08-12 ENCOUNTER — Telehealth: Payer: Self-pay | Admitting: Neurology

## 2019-08-12 ENCOUNTER — Ambulatory Visit (INDEPENDENT_AMBULATORY_CARE_PROVIDER_SITE_OTHER): Payer: Medicare Other | Admitting: Psychology

## 2019-08-12 DIAGNOSIS — F339 Major depressive disorder, recurrent, unspecified: Secondary | ICD-10-CM

## 2019-08-12 DIAGNOSIS — R2689 Other abnormalities of gait and mobility: Secondary | ICD-10-CM

## 2019-08-12 DIAGNOSIS — R270 Ataxia, unspecified: Secondary | ICD-10-CM

## 2019-08-12 NOTE — Telephone Encounter (Signed)
Spoke with patient's counselor, Janett Billow, who reached out to [place] with permission of patient.  She states patient has long history of dizziness/ataxia.  She was seen by Neurology at [facility].  She is still complaining of ongoing symptoms and also mentioned no arm swing, shuffling, and unsteadiness.  Her physical therapist mentioned possible diagnosis of Parkinson's disease to the patient.  She is questioning whether she should have a referral to a movement specialist. Please advise.

## 2019-08-13 NOTE — Telephone Encounter (Signed)
I sent note via community msg.  I had similar concerns.  I sent to neurology.  I think either a follow up with neurology or could make a designated referral to movement specialist if patient wanted. I know neurology wanted her to try PT and then follow up.  Let patient decide.

## 2019-08-13 NOTE — Telephone Encounter (Signed)
Spoke with patient. She would like a referral to the movement specialist. Referral entered to Dr. Carles Collet at [facility] to r/o PD.

## 2019-08-13 NOTE — Telephone Encounter (Signed)
Tried to call patient. VM full. Will try again later.

## 2019-08-13 NOTE — Telephone Encounter (Signed)
Donella Stade, PA-C sent to Annamaria Helling, CMA        I sent this patient a while back to neurology. It does not seem this is improving. Can we call patient and encourage a follow up visit with neurologist.   I would like to start there but we could also make another referral for second opinion as well.   Luvenia Starch   Previous Messages  ----- Message -----  From: Natividad Brood, LCSW  Sent: 08/12/2019  4:52 PM EDT  To: Donella Stade, PA-C   Fara Boros and I had a session today. We were talking about her stressors and she identified her walking issues as her primary concern. Last time we met we talked about using adaptive equipment as or finding modifications to be safe and to allow her to enjoy the things that are important to her, etc.   Kynesha started to talk about it in more detail and described many sx similar to movement disorder sx. (I shared with Luvenia Starch when I called) She also shared that her physical therapist mentioned that she thinks she has PD. I asked if I could have permission to share with you as I just wanted you to know for coordination of patient care. Her walking and concerns have been impacting her mood, but overall she has been doing better in the time that we have been worked together. I have greatly enjoyed working with her.   Best,  Janett Billow

## 2019-08-18 ENCOUNTER — Telehealth: Payer: Self-pay | Admitting: Neurology

## 2019-08-18 NOTE — Telephone Encounter (Signed)
Patient left vm stating she fell Saturday and injured her big toe/foot. She is not sure if she broke something. Wants to know if she should be seen for a visit. I tried to call patient back to discuss. No answer. VM full.

## 2019-08-19 ENCOUNTER — Other Ambulatory Visit: Payer: Self-pay

## 2019-08-19 ENCOUNTER — Ambulatory Visit (INDEPENDENT_AMBULATORY_CARE_PROVIDER_SITE_OTHER): Payer: Medicare Other | Admitting: Physician Assistant

## 2019-08-19 ENCOUNTER — Encounter: Payer: Self-pay | Admitting: Physician Assistant

## 2019-08-19 ENCOUNTER — Ambulatory Visit (INDEPENDENT_AMBULATORY_CARE_PROVIDER_SITE_OTHER): Payer: Medicare Other

## 2019-08-19 VITALS — BP 155/78 | HR 66 | Ht 61.0 in | Wt 144.0 lb

## 2019-08-19 DIAGNOSIS — L03032 Cellulitis of left toe: Secondary | ICD-10-CM | POA: Diagnosis not present

## 2019-08-19 DIAGNOSIS — I10 Essential (primary) hypertension: Secondary | ICD-10-CM | POA: Diagnosis not present

## 2019-08-19 DIAGNOSIS — Z23 Encounter for immunization: Secondary | ICD-10-CM | POA: Diagnosis not present

## 2019-08-19 DIAGNOSIS — M79672 Pain in left foot: Secondary | ICD-10-CM | POA: Diagnosis not present

## 2019-08-19 DIAGNOSIS — R2689 Other abnormalities of gait and mobility: Secondary | ICD-10-CM | POA: Diagnosis not present

## 2019-08-19 DIAGNOSIS — S90422A Blister (nonthermal), left great toe, initial encounter: Secondary | ICD-10-CM

## 2019-08-19 DIAGNOSIS — S99922A Unspecified injury of left foot, initial encounter: Secondary | ICD-10-CM

## 2019-08-19 DIAGNOSIS — R296 Repeated falls: Secondary | ICD-10-CM | POA: Diagnosis not present

## 2019-08-19 MED ORDER — AMOXICILLIN-POT CLAVULANATE 875-125 MG PO TABS: 1.0000 | ORAL_TABLET | Freq: Two times a day (BID) | ORAL | 0 refills | Status: DC

## 2019-08-19 NOTE — Telephone Encounter (Signed)
Patient called back and was put in for a visit.

## 2019-08-19 NOTE — Progress Notes (Signed)
Subjective:    Patient ID: Chelsey Estes, female    DOB: 1941/06/01, 78 y.o.   MRN: [id number]  HPI  Pt is a 78 yo female with balance issues, shuffling gait who comes in after a fall on Saturday over some shoes in the floor. She reports her left toe bent backwards and got 2 abarasions over the dorsal great toe. It was painful but she could walk on it. Since then the blisters popped and then came back bigger. Now her toe is red, swollen, and warm to touch. No fever, chills. She can still walk on foot. She is struggling more and more with balance. She called in and referral was made to movement specialist. She was previously seen by neurologist.   Pt is taking BP medication in morning and afternoon. BP good and even low in morning and seems to rise throughout the day.   .. Active Ambulatory Problems    Diagnosis Date Noted  . History of shingles 04/08/2017  . Adenomatous polyp 04/08/2017  . Pure hypercholesterolemia 04/08/2017  . History of kidney stones 04/08/2017  . Essential hypertension 04/08/2017  . Depression, recurrent ([place]) 04/08/2017  . Anxiety 04/08/2017  . Greater trochanteric bursitis of left hip 04/08/2017  . Left hip pain 04/10/2017  . Metatarsalgia of left foot 08/20/2018  . Chronic foot pain, right 08/20/2018  . Primary osteoarthritis of right foot 09/25/2018  . Snoring 09/25/2018  . Night terrors, adult 09/25/2018  . Multiple falls 09/25/2018  . Orthostatic hypotension 02/01/2019  . Dizziness 02/01/2019  . Moderate episode of recurrent major depressive disorder ([place]) 02/09/2019  . 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 Shuffling gait 02/09/2019  . Balance problems 02/09/2019  . 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  . 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 Blister of great toe of left foot 08/19/2019   Resolved Ambulatory Problems    Diagnosis Date Noted  . No Resolved Ambulatory Problems   Past Medical History:  Diagnosis Date  . Depression   .  Hyperlipidemia   . Hypertension   . Kidney stone   . Kidney stone      Review of Systems See HPI.     Objective:   Physical Exam Vitals signs reviewed.  Constitutional:      Appearance: Normal appearance.  Cardiovascular:     Rate and Rhythm: Normal rate and regular rhythm.  Pulmonary:     Effort: Pulmonary effort is normal.  Musculoskeletal:     Comments: Left great toe: Warm to touch, erythematous and swollen with 2 large blisters on anterior great toe.  Tenderness to palpation.  No pain on plantar foot.  Some ROM noted to great toe.   Neurological:     General: No focal deficit present.     Mental Status: She is alert and oriented to person, place, and time.     Comments: Shuffling gait.   Psychiatric:        Mood and Affect: Mood normal.           Assessment & Plan:  Marland KitchenMarland KitchenZan was seen today for toe injury.  Diagnoses and all orders for this visit:  Cellulitis of left toe -     amoxicillin-clavulanate (AUGMENTIN) 875-125 MG tablet; Take 1 tablet by mouth 2 (two) times daily.  Flu vaccine need -     Flu Vaccine QUAD 36+ mos IM  Injury of left toe, initial encounter -     DG Foot Complete Left  Essential hypertension  Multiple falls  Shuffling  gait  Blister of left great toe, initial encounter -     amoxicillin-clavulanate (AUGMENTIN) 875-125 MG tablet; Take 1 tablet by mouth 2 (two) times daily.   Xray confirmed to fracture to great toe.  Treated cellulitis with augmentin for 10 days.  Encouraged to ice toe and keep elevated.  May take tylenol for pain.  Do not pop blister. Ok to keep light compression on great toe as well.   More and more concerning for a movement disorder. She is scheduled with a movement specialist at the end of this month.   Discussed ways to prevent falls. Pt does not need to be walking dogs anymore. This is just too dangerous. She is needs to always check floor and surrounding area before starting to walk. Always where good  supportive shoes.   BP- take lisnopril one dose in afternoon. Continue to monitor BP.

## 2019-08-19 NOTE — Patient Instructions (Signed)

## 2019-08-24 ENCOUNTER — Encounter: Payer: Self-pay | Admitting: Physician Assistant

## 2019-08-26 ENCOUNTER — Ambulatory Visit (INDEPENDENT_AMBULATORY_CARE_PROVIDER_SITE_OTHER): Payer: Medicare Other | Admitting: Psychology

## 2019-08-26 DIAGNOSIS — F339 Major depressive disorder, recurrent, unspecified: Secondary | ICD-10-CM | POA: Diagnosis not present

## 2019-09-09 ENCOUNTER — Ambulatory Visit (INDEPENDENT_AMBULATORY_CARE_PROVIDER_SITE_OTHER): Payer: Medicare Other | Admitting: Psychology

## 2019-09-09 DIAGNOSIS — F339 Major depressive disorder, recurrent, unspecified: Secondary | ICD-10-CM

## 2019-09-14 ENCOUNTER — Other Ambulatory Visit: Payer: Self-pay | Admitting: Physician Assistant

## 2019-09-14 NOTE — Telephone Encounter (Signed)
Spoke with patient. She states it does help, but she doesn't take every day. She does not need refill at this time. Refill denied.

## 2019-09-14 NOTE — Telephone Encounter (Signed)
Call patient and see how takes this and if she feels like it helps.

## 2019-09-14 NOTE — Telephone Encounter (Signed)
Looks like taking for low back pain. Last filled 06/16/2019 #90 with no refills.

## 2019-09-14 NOTE — Telephone Encounter (Signed)
Left message on machine for patient to call back.

## 2019-09-15 ENCOUNTER — Ambulatory Visit (INDEPENDENT_AMBULATORY_CARE_PROVIDER_SITE_OTHER): Payer: Medicare Other

## 2019-09-15 ENCOUNTER — Other Ambulatory Visit: Payer: Self-pay

## 2019-09-15 ENCOUNTER — Encounter: Payer: Self-pay | Admitting: Physician Assistant

## 2019-09-15 DIAGNOSIS — Z1231 Encounter for screening mammogram for malignant neoplasm of breast: Secondary | ICD-10-CM

## 2019-09-15 DIAGNOSIS — Z13820 Encounter for screening for osteoporosis: Secondary | ICD-10-CM | POA: Diagnosis not present

## 2019-09-15 DIAGNOSIS — Z780 Asymptomatic menopausal state: Secondary | ICD-10-CM | POA: Diagnosis not present

## 2019-09-15 DIAGNOSIS — Z0000 Encounter for general adult medical examination without abnormal findings: Secondary | ICD-10-CM

## 2019-09-15 DIAGNOSIS — M810 Age-related osteoporosis without current pathological fracture: Secondary | ICD-10-CM | POA: Diagnosis not present

## 2019-09-15 NOTE — Progress Notes (Signed)
Normal mammogram. Follow up in 1 year.

## 2019-09-15 NOTE — Progress Notes (Signed)
Pt is osteoporotic. I do think we should start medication to help build the bones stronger. My suggestion is to start a shot every 6 months. What are your thoughts?

## 2019-09-21 ENCOUNTER — Telehealth: Payer: Self-pay | Admitting: Family Medicine

## 2019-09-21 DIAGNOSIS — E7800 Pure hypercholesterolemia, unspecified: Secondary | ICD-10-CM

## 2019-09-21 DIAGNOSIS — Z131 Encounter for screening for diabetes mellitus: Secondary | ICD-10-CM

## 2019-09-21 DIAGNOSIS — M545 Low back pain, unspecified: Secondary | ICD-10-CM

## 2019-09-21 DIAGNOSIS — Z0000 Encounter for general adult medical examination without abnormal findings: Secondary | ICD-10-CM

## 2019-09-21 DIAGNOSIS — Z13220 Encounter for screening for lipoid disorders: Secondary | ICD-10-CM

## 2019-09-21 DIAGNOSIS — G20 Parkinson's disease: Secondary | ICD-10-CM

## 2019-09-21 DIAGNOSIS — I951 Orthostatic hypotension: Secondary | ICD-10-CM

## 2019-09-21 NOTE — Telephone Encounter (Signed)
I have filled out the form to verify benefits and I am waiting on Dr. Madilyn Fireman who is covering for [facility] to sign. Will fax once completed.

## 2019-09-23 ENCOUNTER — Ambulatory Visit (INDEPENDENT_AMBULATORY_CARE_PROVIDER_SITE_OTHER): Payer: Medicare Other | Admitting: Psychology

## 2019-09-23 DIAGNOSIS — F339 Major depressive disorder, recurrent, unspecified: Secondary | ICD-10-CM

## 2019-09-29 ENCOUNTER — Ambulatory Visit (INDEPENDENT_AMBULATORY_CARE_PROVIDER_SITE_OTHER): Payer: Medicare Other | Admitting: Psychology

## 2019-09-29 DIAGNOSIS — R3915 Urgency of urination: Secondary | ICD-10-CM | POA: Diagnosis not present

## 2019-09-29 DIAGNOSIS — F339 Major depressive disorder, recurrent, unspecified: Secondary | ICD-10-CM | POA: Diagnosis not present

## 2019-09-29 DIAGNOSIS — N200 Calculus of kidney: Secondary | ICD-10-CM | POA: Diagnosis not present

## 2019-09-30 NOTE — Telephone Encounter (Signed)
Chelsey Estes has faxed the form to the patient assistance. Waiting on response.

## 2019-10-03 ENCOUNTER — Other Ambulatory Visit: Payer: Self-pay | Admitting: Physician Assistant

## 2019-10-03 DIAGNOSIS — F419 Anxiety disorder, unspecified: Secondary | ICD-10-CM

## 2019-10-03 DIAGNOSIS — F339 Major depressive disorder, recurrent, unspecified: Secondary | ICD-10-CM

## 2019-10-06 NOTE — Telephone Encounter (Signed)
I have re-faxed the form to [employer]. I also called in to check the status and see if a PA was needed and if the form has been received. Confirmation as of 10/06/2019 for 2 times.

## 2019-10-12 NOTE — Progress Notes (Signed)
Chelsey Estes was seen today in the movement disorders clinic for neurologic consultation at the request of Donella Stade, PA-C.  The consultation is for the evaluation of shuffling gait and ataxia.  This is for a second opinion.  Patient has already seen Dr. Lynnette Caffey at [place].  I have reviewed his records.  Patient first saw Dr. Lynnette Caffey on February 10, 2019 for dizziness and balance change (this was the only visit).  He felt that her issues were likely more vertiginous and inner ear related.  He did order an MRI of the brain, which was completed on February 22, 2019.  This demonstrated a very small (7 mm x 6 mm) pituitary microadenoma.  It was otherwise reported to be fairly unremarkable, with the exception of some moderate white matter disease.  She had a carotid ultrasound on July 01, 2019 that did not demonstrate any hemodynamically significant stenosis.    On August 12, 2019, the patient's counselor, Myra Gianotti, I reached out to the primary care physician assistant stating concerns that she felt that the patient might have Parkinson's disease.  The physical therapist also mentioned the possibility to the patient.  The patient was therefore referred here for a second opinion.  Pt states that it started with balance change but with the course of time she has developed shuffling.  She describes some festination of gait.  She also notes that the R hand wants to flex up and not swing when she walks  Specific Symptoms:  Tremor: No. Family hx of similar:  No. Voice: no change  Sleep:   Vivid Dreams:  Yes.   - " I have night terrors and scream out"  Acting out dreams:  Yes.   Wet Pillows: No. Postural symptoms:  Yes.    Falls?  Yes.  , last fall was 2 months ago.  She tripped over shoes that time and fell.  The prior fall was about 2 months before that and she fell over the door jam going inside.   Bradykinesia symptoms: difficulty getting out of a chair (attributes to knees); shuffles;  shortened stride length Loss of smell:  No. Loss of taste:  No. Urinary Incontinence:  No. but some urgency Difficulty Swallowing:  No. Handwriting, micrographia: No. Trouble with ADL's:  No.  Trouble buttoning clothing: No. Depression:  No. but has some anxiety (husband is alcoholic) Memory changes:  No. Hallucinations:  No.  visual distortions: No. N/V:  No. Lightheaded:  No.  Syncope: No. Diplopia:  Yes.   if turns too far horizontally either side. Dyskinesia:  No. Prior exposure to reglan/antipsychotics: No.   PREVIOUS MEDICATIONS: none to date  ALLERGIES:  No Known Allergies  CURRENT MEDICATIONS:  Current Outpatient Medications  Medication Instructions   ALPHAGAN P 0.1 % SOLN No dose, route, or frequency recorded.   AMBULATORY NON FORMULARY MEDICATION Flex it tens unit as needed for low back pain   buPROPion (WELLBUTRIN XL) 300 MG 24 hr tablet TAKE 1 TABLET(300 MG) BY MOUTH DAILY   cyanocobalamin 2,000 mcg, Oral, Daily   cyclobenzaprine (FLEXERIL) 5 mg, Oral, 3 times daily   diclofenac sodium (VOLTAREN) 4 g, Topical, 4 times daily, To affected joint.   lisinopril (ZESTRIL) 5 mg, Oral, 2 times daily   Melatonin 5 MG CHEW 2 tablets, Oral, Daily at bedtime   meloxicam (MOBIC) 15 MG tablet TAKE 1 TABLET(15 MG) BY MOUTH DAILY   simvastatin (ZOCOR) 10 mg, Oral, Daily   solifenacin (VESICARE) 5 MG tablet No  dose, route, or frequency recorded.   timolol (TIMOPTIC) 0.5 % ophthalmic solution No dose, route, or frequency recorded.    PAST MEDICAL HISTORY:   Past Medical History:  Diagnosis Date   Anxiety    Depression    Glaucoma    Hyperlipidemia    Hypertension    Kidney stone     PAST SURGICAL HISTORY:   Past Surgical History:  Procedure Laterality Date   ABDOMINAL HYSTERECTOMY     ANTERIOR (CYSTOCELE) AND POSTERIOR REPAIR (RECTOCELE) WITH XENFORM GRAFT AND SACROSPINOUS FIXATION     CATARACT EXTRACTION, BILATERAL     INGUINAL HERNIA REPAIR  Left     SOCIAL HISTORY:   Social History   Socioeconomic History   Marital status: Married    Spouse name: Ron   Number of children: 2   Years of education: 14   Highest education level: Some college, no degree  Occupational History   Occupation: Educational psychologist- Psychologist, prison and probation services    Comment: retired  Scientist, product/process development strain: Not hard at all   Food insecurity    Worry: Never true    Inability: Never true   Transportation needs    Medical: No    Non-medical: No  Tobacco Use   Smoking status: Never Smoker   Smokeless tobacco: Never Used  Substance and Sexual Activity   Alcohol use: No   Drug use: No   Sexual activity: Not Currently  Lifestyle   Physical activity    Days per week: 5 days    Minutes per session: 30 min   Stress: Not at all  Relationships   Social connections    Talks on phone: More than three times a week    Gets together: Once a week    Attends religious service: Never    Active member of club or organization: No    Attends meetings of clubs or organizations: Never    Relationship status: Married   Intimate partner violence    Fear of current or ex partner: No    Emotionally abused: No    Physically abused: No    Forced sexual activity: No  Other Topics Concern   Not on file  Social History Narrative   Not on file    FAMILY HISTORY:   Family Status  Relation Name Status   Father  Deceased   MGF  (Not Specified)   PGF  (Not Specified)   Mother  Deceased   Sister  Alive   Son  Alive   Daughter  Alive    ROS:  Review of Systems  Constitutional: Negative.   HENT: Negative.   Eyes: Negative.   Respiratory: Negative.   Cardiovascular: Negative.   Gastrointestinal: Negative.   Genitourinary: Negative.   Musculoskeletal: Negative.   Skin: Negative.   Endo/Heme/Allergies: Negative.     PHYSICAL EXAMINATION:    VITALS:   Vitals:   10/14/19 1320  BP: (!) 149/83  Pulse: 74    Resp: 18  Temp: 98.2 F (36.8 C)  SpO2: 97%  Weight: 150 lb (68 kg)  Height: 5' 1"  (1.549 m)    GEN:  The patient appears stated age and is in NAD. HEENT:  Normocephalic, atraumatic.  The mucous membranes are moist. The superficial temporal arteries are without ropiness or tenderness. CV:  RRR Lungs:  CTAB Neck/HEME:  There are no carotid bruits bilaterally.  Neurological examination:  Orientation: The patient is alert and oriented x3. Fund of knowledge is appropriate.  Recent and remote memory are intact.  Attention and concentration are normal.    Able to name objects and repeat phrases. Cranial nerves: There is good facial symmetry. There is facial hypomimia.   Extraocular muscles are intact. The visual fields are full to confrontational testing. The speech is fluent and clear. Soft palate rises symmetrically and there is no tongue deviation. Hearing is intact to conversational tone. Sensation: Sensation is intact to light and pinprick throughout (facial, trunk, extremities). Vibration is intact at the bilateral big toe. There is no extinction with double simultaneous stimulation. There is no sensory dermatomal level identified. Motor: Strength is 5/5 in the bilateral upper and lower extremities.   Shoulder shrug is equal and symmetric.  There is no pronator drift. Deep tendon reflexes: Deep tendon reflexes are 2/4 at the bilateral biceps, triceps, brachioradialis, patella and achilles. Plantar responses are downgoing bilaterally.  Movement examination: Tone: There is mild increased tone in the RUE/RLE Abnormal movements: mild tremulousness can be felt with distraction (diffuse) Coordination:  There is  decremation with RAM's, with any form of RAMS, including alternating supination and pronation of the forearm, hand opening and closing, finger taps, heel taps and toe taps on the right Gait and Station: The patient has no difficulty arising out of a deep-seated chair without the use of  the hands. The patient's stride length is decreased.  The longer she walks, the more she drags the R leg and starts to festinate.  She has decreased arm swing on the right.    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  Discussed the differences, particularly in prognosis, with the patient.  Discussed that it can take time to tell, but that they are treated the same.  It is very difficult to tell given the fact that she has also had several eye surgeries.  -We will start carbidopa/levodopa 25/100 and work to 1 tablet 3 times per day.  Discussed risk, benefits, and side effects.  -discussed 4 wheeled walker  -[facility] [place] for [facility]  -She asked about driving.  She apparently has been very nervous about driving for some time.  I recommended [facility]'s driving program.  She is going to think about that.  -Met with my Education officer, museum today.  2.  Diplopia  -refer to [facility] ophthalmology.    3.  Anxiety and depression  -Undergoing counseling with Myra Gianotti.  Husband is alcoholic.    -no SI/HI  4.  Follow up is anticipated in the next 4 months, sooner should new neurologic issues arise.  Much greater than 50% of this visit was spent in counseling and coordinating care.  Total face to face time:  60 min  Cc:  Breeback, Jade L, PA-C

## 2019-10-13 ENCOUNTER — Ambulatory Visit (INDEPENDENT_AMBULATORY_CARE_PROVIDER_SITE_OTHER): Payer: Medicare Other | Admitting: Psychology

## 2019-10-13 DIAGNOSIS — F3340 Major depressive disorder, recurrent, in remission, unspecified: Secondary | ICD-10-CM

## 2019-10-14 ENCOUNTER — Encounter: Payer: Self-pay | Admitting: Neurology

## 2019-10-14 ENCOUNTER — Ambulatory Visit (INDEPENDENT_AMBULATORY_CARE_PROVIDER_SITE_OTHER): Payer: Medicare Other | Admitting: Neurology

## 2019-10-14 ENCOUNTER — Other Ambulatory Visit: Payer: Self-pay

## 2019-10-14 VITALS — BP 149/83 | HR 74 | Temp 98.2°F | Resp 18 | Ht 61.0 in | Wt 150.0 lb

## 2019-10-14 DIAGNOSIS — H532 Diplopia: Secondary | ICD-10-CM

## 2019-10-14 DIAGNOSIS — G20 Parkinson's disease: Secondary | ICD-10-CM | POA: Diagnosis not present

## 2019-10-14 MED ORDER — CARBIDOPA-LEVODOPA 25-100 MG PO TABS: 1.0000 | ORAL_TABLET | Freq: Three times a day (TID) | ORAL | 1 refills | Status: DC

## 2019-10-14 NOTE — Patient Instructions (Signed)
1.  Start Carbidopa Levodopa as follows:  Take 1/2 tablet three times daily, at least 30 minutes before meals (approximately 7am/11am/4pm), for one week  Then take 1/2 tablet in the morning, 1/2 tablet in the afternoon, 1 tablet in the evening, at least 30 minutes before meals, for one week  Then take 1/2 tablet in the morning, 1 tablet in the afternoon, 1 tablet in the evening, at least 30 minutes before meals, for one week  Then take 1 tablet three times daily at 7am/11am/4pm, at least 30 minutes before meals   As a reminder, carbidopa/levodopa can be taken at the same time as a carbohydrate, but we like to have you take your pill either 30 minutes before a protein source or 1 hour after as protein can interfere with carbidopa/levodopa absorption.  2.  We will send a referral to Dr. Valetta Close or Dr Prudencio Burly at [place] and staff at [facility] are committed to providing excellent care. You may receive a survey requesting feedback about your experience at our office. We strive to receive "very good" responses to the survey questions. If you feel that your experience would prevent you from giving the office a "very good " response, please contact our office to try to remedy the situation. We may be reached at [phone]. Thank you for taking the time out of your busy day to complete the survey.

## 2019-10-15 DIAGNOSIS — N2889 Other specified disorders of kidney and ureter: Secondary | ICD-10-CM | POA: Diagnosis not present

## 2019-10-15 DIAGNOSIS — N19 Unspecified kidney failure: Secondary | ICD-10-CM | POA: Diagnosis not present

## 2019-10-15 DIAGNOSIS — N200 Calculus of kidney: Secondary | ICD-10-CM | POA: Diagnosis not present

## 2019-10-20 NOTE — Telephone Encounter (Signed)
Unable to verify benefits with [employer].

## 2019-10-22 ENCOUNTER — Ambulatory Visit (INDEPENDENT_AMBULATORY_CARE_PROVIDER_SITE_OTHER): Payer: Medicare Other | Admitting: Rehabilitative and Restorative Service Providers"

## 2019-10-22 ENCOUNTER — Other Ambulatory Visit: Payer: Self-pay

## 2019-10-22 DIAGNOSIS — Z9181 History of falling: Secondary | ICD-10-CM | POA: Diagnosis not present

## 2019-10-22 DIAGNOSIS — R2681 Unsteadiness on feet: Secondary | ICD-10-CM

## 2019-10-22 DIAGNOSIS — R2689 Other abnormalities of gait and mobility: Secondary | ICD-10-CM | POA: Diagnosis not present

## 2019-10-22 DIAGNOSIS — M6281 Muscle weakness (generalized): Secondary | ICD-10-CM

## 2019-10-22 NOTE — Patient Instructions (Signed)
Access Code: [email or web address]  URL: [email or web address]  Date: 10/22/2019  Prepared by: Rudell Cobb   Exercises Staggered Stance Weight Shift with Arms Reaching - 10 reps - 1 sets - 2x daily - 7x weekly Side to Side Weight Shift with Overhead Reach and Counter Support - 10 reps - 1 sets - 2x daily - 7x weekly

## 2019-10-22 NOTE — Therapy (Signed)
[place], [place], [address] Phone: [phone]   Fax:  [phone]  Physical Therapy Evaluation  Patient Details  Name: Chelsey Estes MRN: [id number] Date of Birth: 1941/06/15 Referring Provider (PT): Tat, Eustace Quail, DO   Encounter Date: 10/22/2019  PT End of Session - 10/22/19 1520    Visit Number  1    Number of Visits  16    Date for PT Re-Evaluation  12/21/19    Authorization Type  medicare/BCBS    PT Start Time  [id number]    PT Stop Time  1530    PT Time Calculation (min)  45 min       Past Medical History:  Diagnosis Date  . Anxiety   . Depression   . Glaucoma   . Hyperlipidemia   . Hypertension   . Kidney stone     Past Surgical History:  Procedure Laterality Date  . ABDOMINAL HYSTERECTOMY    . ANTERIOR (CYSTOCELE) AND POSTERIOR REPAIR (RECTOCELE) WITH XENFORM GRAFT AND SACROSPINOUS FIXATION    . CATARACT EXTRACTION, BILATERAL    . INGUINAL HERNIA REPAIR Left     There were no vitals filed for this visit.   Subjective Assessment - 10/22/19 1449    Subjective  The patient reports a funny feeling in her head, staggering gait, visual changes that lead to falls.  She has had approximately 3 falls in the past 6 months and is able to get up independently.  The double vision is present when she looks to the sides (not straight forward).  She denies headaches, she reports occasional dizziness, she denies hearing changes.  The patient recently saw neurologist and was dx with Parkinsonism.    Pertinent History  HTN (takes meds at lunch time due to lower blood pressure in the morning), depression, recently dx with Parkinsonism.    Patient Stated Goals  "Strengthen my core and my legs."    Currently in Pain?  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  3-- tripping over threshold, not picking feet up    Has the patient had a decrease in activity level because of a fear of falling?   No    Is the patient reluctant to leave  their home because of a fear of falling?   No      Home Environment   Living Environment  Private residence    Living Arrangements  Spouse/significant other    Type of [place] to enter    Entrance Stairs-Number of Steps  7    Entrance Stairs-Rails  Right    [place] to live on main level with bedroom/bathroom    Home Equipment  None      Prior Function   Level of Independence  Independent      Sensation   Light Touch  Appears Intact      Coordination   Gross Motor Movements are Fluid and Coordinated  Yes   denies tremors   Finger Nose Finger Test  intact and symmetrical bilaterally    Heel Shin Test  hard to lift to proximal shin due to leg weakness      Posture/Postural Control   Posture/Postural Control  Postural limitations    Posture Comments  L 2nd toe hammer toe      ROM / Strength   AROM / PROM / Strength  AROM;Strength      AROM   Overall AROM   Within functional limits for tasks performed    Overall AROM Comments  Notes L shoulder pain with  pulling shirt over her head      Strength   Overall Strength  Within functional limits for tasks performed    Overall Strength Comments  Bilateral shoulder flexion and abduction 5/5, bilateral elbow flexion/extension 5/5, bilateral hip flexion 3+/5, knee flexion/extension 5/5, ankle DF 4/5.      Transfers   Five time sit to stand comments   14.34 seconds    Comments  notes a hard time standing from low surfaces.      Ambulation/Gait   Ambulation/Gait  Yes    Ambulation/Gait Assistance  6: Modified independent (Device/Increase time)   slowed pace   Ambulation Distance (Feet)  150 Feet    Assistive device  None    Gait Pattern  Decreased stride length;Decreased arm swing - right;Decreased arm swing - left;Decreased dorsiflexion - right;Right flexed knee in stance;Narrow base of support    Gait velocity  2.37 ft/sec      Standardized Balance Assessment   Standardized Balance Assessment  Berg Balance Test      Berg Balance Test   Sit to Stand  Able to stand without using hands and stabilize independently    Standing Unsupported  Able to stand safely 2 minutes    Sitting with Back Unsupported but Feet Supported on Floor or Stool  Able to sit safely and securely 2 minutes    Stand to Sit  Sits safely with minimal use of hands    Transfers  Able to transfer safely, minor use of hands    Standing Unsupported with Eyes Closed  Able to stand 10 seconds safely    Standing Unsupported with Feet Together  Able to place feet together independently and stand 1 minute safely    From Standing, Reach Forward with Outstretched Arm  Can reach confidently >25 cm (10")    From Standing Position, Pick up Object from Floor  Able to pick up shoe safely and easily    From Standing Position, Turn to Look Behind Over each Shoulder  Turn sideways only but maintains balance    Turn 360 Degrees  Able to turn 360 degrees safely but  slowly   takes *8+ steps   Standing Unsupported, Alternately Place Feet on  Step/Stool  Able to complete 4 steps without aid or supervision    Standing Unsupported, One Foot in Front  Able to plae foot ahead of the other independently and hold 30 seconds    Standing on One Leg  Able to lift leg independently and hold > 10 seconds    Total Score  49    Berg comment:  49/56       Functional Gait  Assessment   Gait assessed   Yes    Gait Level Surface  Walks 20 ft, slow speed, abnormal gait pattern, evidence for imbalance or deviates 10-15 in outside of the 12 in walkway width. Requires more than 7 sec to ambulate 20 ft.   slower pace   Change in Gait Speed  Able to change speed, demonstrates mild gait deviations, deviates 6-10 in outside of the 12 in walkway width, or no gait deviations, unable to achieve a major change in velocity, or uses a change in velocity, or uses an assistive device.    Gait with Horizontal Head Turns  Performs head turns with moderate changes in gait velocity, slows down, deviates 10-15 in outside 12 in walkway width but recovers, can continue to walk.    Gait with Vertical Head Turns  Performs task with moderate change in gait velocity, slows down, deviates 10-15 in outside 12 in walkway width but recovers, can continue to walk.    Gait and Pivot Turn  Pivot turns safely in greater than 3 sec and stops with no loss of balance, or pivot turns safely within 3 sec and stops with mild imbalance, requires small steps to catch balance.    Step Over Obstacle  Cannot perform without assistance.    Gait with Narrow Base of Support  Ambulates 7-9 steps.    Gait with Eyes Closed  Walks 20 ft, slow speed, abnormal gait pattern, evidence for imbalance, deviates 10-15 in outside 12 in walkway width. Requires more than 9 sec to ambulate 20 ft.    Ambulating Backwards  Walks 20 ft, slow speed, abnormal gait pattern, evidence for imbalance, deviates 10-15 in outside 12 in walkway width.    Steps  Alternating feet, must use rail.    Total Score  13    FGA comment:   13/30                Objective measurements completed on examination: See above findings.              PT Education - 10/22/19 1602    Education Details  HEP initiated    Person(s) Educated  Patient    Methods  Explanation;Demonstration;Handout    Comprehension  Verbalized understanding;Returned demonstration       PT Short Term Goals - 10/22/19 1644      PT SHORT TERM GOAL #1   Title  The patient will return demo HEP for balance, hip strength and general mobility.    Time  4    Period  Weeks    Target Date  11/21/19      PT SHORT TERM GOAL #2   Title  The patient will improve FGA from 13/30 to > or equal to 18/30 to demonstrate improved dynamic gait and reduced risk for falls.    Time  4    Period  Weeks    Target Date  11/21/19      PT SHORT TERM GOAL #  3   Title  The patient will demonstrate a 360 degree turn in < or equal to 4 seconds.    Time  4    Period  Weeks    Target Date  11/21/19      PT SHORT TERM GOAL #4   Title  The patient will demonstrate improved R heel strike demonstrating 50% occurrence of R heel strike at initial stance of gait.    Time  4    Period  Weeks    Target Date  11/21/19        PT Long Term Goals - 10/22/19 1646      PT LONG TERM GOAL #1   Title  The patient will be indep with progression of HEP for post d/c.    Time  8    Period  Weeks    Target Date  12/21/19      PT LONG TERM GOAL #2   Title  The patient will improve gait speed from 2.37 ft/sec up to 2.62 ft sec to demo transition to community ambulator classification of gait.    Time  8    Period  Weeks    Target Date  12/21/19      PT LONG TERM GOAL #3   Title  The patient will improve Berg score from 49/56 to > or equal to 53/56 to demo dec'd risk for falls.    Time  8    Period  Weeks    Target Date  12/21/19      PT LONG TERM GOAL #4   Title  The patient will demo floor<>stand transfer with UE support due to h/o falls.    Time  8    Period   Weeks    Target Date  12/21/19             Plan - 10/22/19 1649    Clinical Impression Statement  The patient is a 78yo female presenting to OP physical therapy with dx of Primary Parkinsonism presenting with impaired dynamic balance, dynamic gait, dec'd R foot clearance during swing phase of gait, postural tightness, decreased weight shift.  PT to address deficits to promote reduction of falls, improved balance for functional activities and to optimize current functional status.    Personal Factors and Comorbidities  Comorbidity 1    Comorbidities  HTN    Examination-Activity Limitations  Locomotion Level;Stairs    Examination-Participation Restrictions  Community Activity;Yard Work    Stability/Clinical Decision Making  Stable/Uncomplicated    Designer, jewellery  Low    Rehab Potential  Good    PT Frequency  2x / week    PT Duration  8 weeks    PT Treatment/Interventions  ADLs/Self Care Home Management;Gait training;Stair training;Functional mobility training;Therapeutic activities;Therapeutic exercise;Balance training;Neuromuscular re-education;Patient/family education;Passive range of motion    PT Next Visit Plan  Check HEP, progress to PWR! exercises, work on lateral weight shift integrating into turns, work on R foot heel strike, PWR exercises to improve bed mobility.    PT Home Exercise Plan  Access Code: [email or web address]    Consulted and Agree with Plan of Care  Patient       Patient will benefit from skilled therapeutic intervention in order to improve the following deficits and impairments:  Abnormal gait, Difficulty walking, Decreased balance, Decreased strength, Postural dysfunction  Visit Diagnosis: Unsteadiness on feet  Other abnormalities of gait and mobility  History of falling  Muscle weakness (generalized)     Problem List  Patient Active Problem List   Diagnosis Date Noted  . Osteoporosis 09/15/2019  . Blister of great toe of left foot 08/19/2019  .  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 Moderate episode of recurrent major depressive disorder ([place]) 02/09/2019  . 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 Shuffling gait 02/09/2019  . Balance problems 02/09/2019  . 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  . Orthostatic hypotension 02/01/2019  . Dizziness 02/01/2019  . Primary osteoarthritis of right foot 09/25/2018  . Snoring 09/25/2018  . Night terrors, adult 09/25/2018  . Multiple falls 09/25/2018  . Metatarsalgia of left foot 08/20/2018  . Chronic foot pain, right 08/20/2018  . Left hip pain 04/10/2017  . History of shingles 04/08/2017  . Adenomatous polyp 04/08/2017  . Pure hypercholesterolemia 04/08/2017  . History of kidney stones 04/08/2017  . Essential hypertension 04/08/2017  . Depression, recurrent ([place]) 04/08/2017  . Anxiety 04/08/2017  . Greater trochanteric bursitis of left hip 04/08/2017    Myrl Lazarus, PT 10/22/2019, 4:52 PM  [facility] [place], [place], [address] Phone: [phone]   Fax:  [phone]  Name: Akansha Harrower MRN: [id number] Date of Birth: Dec 02, 1941

## 2019-10-25 ENCOUNTER — Other Ambulatory Visit: Payer: Self-pay

## 2019-10-25 ENCOUNTER — Ambulatory Visit (INDEPENDENT_AMBULATORY_CARE_PROVIDER_SITE_OTHER): Payer: Medicare Other | Admitting: Physical Therapy

## 2019-10-25 DIAGNOSIS — R2689 Other abnormalities of gait and mobility: Secondary | ICD-10-CM

## 2019-10-25 DIAGNOSIS — M6281 Muscle weakness (generalized): Secondary | ICD-10-CM

## 2019-10-25 DIAGNOSIS — Z9181 History of falling: Secondary | ICD-10-CM

## 2019-10-25 DIAGNOSIS — R2681 Unsteadiness on feet: Secondary | ICD-10-CM

## 2019-10-25 NOTE — Patient Instructions (Signed)
Access Code: [email or web address]  URL: [email or web address]  Date: 10/25/2019  Prepared by: Kerin Perna   Exercises  Staggered Stance Weight Shift with Arms Reaching - 10 reps - 1 sets - 2x daily - 7x weekly  Side to Side Weight Shift with Overhead Reach and Counter Support - 10 reps - 1 sets - 2x daily - 7x weekly  Gastroc Stretch on Wall - 2-3 reps - 1 sets - 20 seconds hold - 1x daily - 7x weekly  Heel Toe Raises with Counter Support - 10 reps - 1-2 sets - 1x daily - 7x weekly  Side Stepping with Unilateral Counter Support - 10 reps - 2 sets - 1x daily - 7x weekly  Standing Anterior Toe Taps - 10 reps - 1 sets - 1x daily - 7x weekly

## 2019-10-25 NOTE — Therapy (Signed)
[place], [place], [address] Phone: [phone]   Fax:  [phone]  Physical Therapy Treatment  Patient Details  Name: Chelsey Estes MRN: [id number] Date of Birth: 1941/01/25 Referring Provider (PT): Tat, Eustace Quail, DO   Encounter Date: 10/25/2019  PT End of Session - 10/25/19 1644    Visit Number  2    Number of Visits  16    Date for PT Re-Evaluation  12/21/19    Authorization Type  medicare/BCBS    PT Start Time  1600    PT Stop Time  1639    PT Time Calculation (min)  39 min    Activity Tolerance  Patient tolerated treatment well    Behavior During Therapy  [facility] for tasks assessed/performed       Past Medical History:  Diagnosis Date  . Anxiety   . Depression   . Glaucoma   . Hyperlipidemia   . Hypertension   . Kidney stone     Past Surgical History:  Procedure Laterality Date  . ABDOMINAL HYSTERECTOMY    . ANTERIOR (CYSTOCELE) AND POSTERIOR REPAIR (RECTOCELE) WITH XENFORM GRAFT AND SACROSPINOUS FIXATION    . CATARACT EXTRACTION, BILATERAL    . INGUINAL HERNIA REPAIR Left     There were no vitals filed for this visit.  Subjective Assessment - 10/25/19 1604    Subjective  Pt reports she has been practicing her walking and the exercises.  She believes she is walking better, when she focuses on it.    Pertinent History  HTN (takes meds at lunch time due to lower blood pressure in the morning), depression, recently dx with Parkinsonism.    Patient Stated Goals  "Strengthen my core and my legs."    Currently in Pain?  No/denies   reports LBP up to 1/94 with certain motions.   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[place])    Referring Provider (PT)  Tat, Eustace Quail, DO    Onset Date/Surgical Date  10/14/19   recently dx with Primary Parkinsonism   Hand Dominance  Right     Prior Therapy  known to this clinic from prior physical therapy      [facility] Adult PT Treatment/Exercise - 10/25/19 0001      Exercises   Exercises  Knee/Hip      Knee/Hip Exercises: Stretches   Gastroc Stretch  Right;2 reps;Left;1 rep;20 seconds      Knee/Hip Exercises: Aerobic   Nustep  L3: arms/legs x 4.5 min for warm up       Knee/Hip Exercises: Standing   Other Standing Knee Exercises  side to side weight shifts with same side arm reaches overhead x 10, repeated with reaching cross body over head x 10; side stepping at counter (light to no UE support) with increased step height and speed x 10 ft Rt/Lt x 2 reps;  retro walking 10 ft (intermittent support on counter) x 10 ft x 6 reps (cues to increase Lt step length;  heel/toe raises x 10 with UE support on counter.     Other Standing Knee Exercises  Toe taps to 3" step x 10 each leg, then alternating x 10 reps (intermittent UE support on rail)       Knee/Hip Exercises: Seated   Long Arc Quad  Right;Left;5  reps    Marching  Both;1 set;10 reps    Sit to Sand  10 reps;without UE support   eccentric lowering            PT Education - 10/25/19 1716    Education Details  HEP    Person(s) Educated  Patient    Methods  Explanation;Demonstration;Handout    Comprehension  Returned demonstration;Verbalized understanding       PT Short Term Goals - 10/22/19 1644      PT SHORT TERM GOAL #1   Title  The patient will return demo HEP for balance, hip strength and general mobility.    Time  4    Period  Weeks    Target Date  11/21/19      PT SHORT TERM GOAL #2   Title  The patient will improve FGA from 13/30 to > or equal to 18/30 to demonstrate improved dynamic gait and reduced risk for falls.    Time  4    Period  Weeks    Target Date  11/21/19      PT SHORT TERM GOAL #3   Title  The patient will demonstrate a 360 degree turn in < or equal to 4 seconds.    Time  4    Period  Weeks    Target Date  11/21/19      PT SHORT  TERM GOAL #4   Title  The patient will demonstrate improved R heel strike demonstrating 50% occurrence of R heel strike at initial stance of gait.    Time  4    Period  Weeks    Target Date  11/21/19        PT Long Term Goals - 10/22/19 1646      PT LONG TERM GOAL #1   Title  The patient will be indep with progression of HEP for post d/c.    Time  8    Period  Weeks    Target Date  12/21/19      PT LONG TERM GOAL #2   Title  The patient will improve gait speed from 2.37 ft/sec up to 2.62 ft sec to demo transition to community ambulator classification of gait.    Time  8    Period  Weeks    Target Date  12/21/19      PT LONG TERM GOAL #3   Title  The patient will improve Berg score from 49/56 to > or equal to 53/56 to demo dec'd risk for falls.    Time  8    Period  Weeks    Target Date  12/21/19      PT LONG TERM GOAL #4   Title  The patient will demo floor<>stand transfer with UE support due to h/o falls.    Time  8    Period  Weeks    Target Date  12/21/19            Plan - 10/25/19 1644    Clinical Impression Statement  Pt able to demonstrate improved clearance of Rt foot and increased DF during heel strike with deliberate focus on gait between exercises.  Pt tolerated all exercises well, requiring occasional cues to lift RLE higher or take a bigger step with LLE.  No goals met yet, only 2nd visit.    Personal Factors and Comorbidities  Comorbidity 1    Comorbidities  HTN    Examination-Activity Limitations  Locomotion Level;Stairs    Examination-Participation Restrictions  Community Activity;Yard Work    Stability/Clinical Decision Making  Stable/Uncomplicated    Rehab Potential  Good    PT Frequency  2x / week    PT Duration  8 weeks    PT Treatment/Interventions  ADLs/Self Care Home Management;Gait training;Stair training;Functional mobility training;Therapeutic activities;Therapeutic exercise;Balance training;Neuromuscular re-education;Patient/family  education;Passive range of motion    PT Next Visit Plan  progress to PWR! exercises, add supine PWR exercises to improve bed mobility.    PT Home Exercise Plan  Access Code: [email or web address]    Consulted and Agree with Plan of Care  Patient       Patient will benefit from skilled therapeutic intervention in order to improve the following deficits and impairments:  Abnormal gait, Difficulty walking, Decreased balance, Decreased strength, Postural dysfunction  Visit Diagnosis: Unsteadiness on feet  Other abnormalities of gait and mobility  History of falling  Muscle weakness (generalized)     Problem List Patient Active Problem List   Diagnosis Date Noted  . Osteoporosis 09/15/2019  . Blister of great toe of left foot 08/19/2019  . 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 Moderate episode of recurrent major depressive disorder ([place]) 02/09/2019  . 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 Shuffling gait 02/09/2019  . Balance problems 02/09/2019  . 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  . Orthostatic hypotension 02/01/2019  . Dizziness 02/01/2019  . Primary osteoarthritis of right foot 09/25/2018  . Snoring 09/25/2018  . Night terrors, adult 09/25/2018  . Multiple falls 09/25/2018  . Metatarsalgia of left foot 08/20/2018  . Chronic foot pain, right 08/20/2018  . Left hip pain 04/10/2017  . History of shingles 04/08/2017  . Adenomatous polyp 04/08/2017  . Pure hypercholesterolemia 04/08/2017  . History of kidney stones 04/08/2017  . Essential hypertension 04/08/2017  . Depression, recurrent ([place]) 04/08/2017  . Anxiety 04/08/2017  . Greater trochanteric bursitis of left hip 04/08/2017   Kerin Perna, PTA 10/25/19 5:22 PM  [facility] [place], [place], [address] Phone: [phone]   Fax:  [phone]  Name: Chelsey Estes MRN: [id number] Date of Birth: 11/13/1941

## 2019-10-26 ENCOUNTER — Encounter: Payer: Self-pay | Admitting: Physician Assistant

## 2019-10-26 DIAGNOSIS — G20 Parkinson's disease: Secondary | ICD-10-CM | POA: Insufficient documentation

## 2019-10-27 ENCOUNTER — Ambulatory Visit (INDEPENDENT_AMBULATORY_CARE_PROVIDER_SITE_OTHER): Payer: Medicare Other | Admitting: Psychology

## 2019-10-27 DIAGNOSIS — F339 Major depressive disorder, recurrent, unspecified: Secondary | ICD-10-CM

## 2019-10-29 ENCOUNTER — Ambulatory Visit (INDEPENDENT_AMBULATORY_CARE_PROVIDER_SITE_OTHER): Payer: Medicare Other | Admitting: Rehabilitative and Restorative Service Providers"

## 2019-10-29 ENCOUNTER — Encounter: Payer: Self-pay | Admitting: Rehabilitative and Restorative Service Providers"

## 2019-10-29 ENCOUNTER — Other Ambulatory Visit: Payer: Self-pay

## 2019-10-29 DIAGNOSIS — Z9181 History of falling: Secondary | ICD-10-CM

## 2019-10-29 DIAGNOSIS — R2689 Other abnormalities of gait and mobility: Secondary | ICD-10-CM | POA: Diagnosis not present

## 2019-10-29 DIAGNOSIS — M6281 Muscle weakness (generalized): Secondary | ICD-10-CM | POA: Diagnosis not present

## 2019-10-29 DIAGNOSIS — R2681 Unsteadiness on feet: Secondary | ICD-10-CM

## 2019-10-29 NOTE — Therapy (Signed)
Chelsey Estes  [place], [place], [address] Phone: [phone]   Fax:  [phone]  Physical Therapy Treatment  Patient Details  Name: Chelsey Estes MRN: [id number] Date of Birth: 03-08-1941 Referring Provider (PT): Tat, Eustace Quail, DO   Encounter Date: 10/29/2019  PT End of Session - 10/29/19 1402    Visit Number  3    Number of Visits  16    Date for PT Re-Evaluation  12/21/19    Authorization Type  medicare/BCBS    PT Start Time  [id number]    PT Stop Time  1445    PT Time Calculation (min)  42 min    Activity Tolerance  Patient tolerated treatment well    Behavior During Therapy  [facility] for tasks assessed/performed       Past Medical History:  Diagnosis Date  . Anxiety   . Depression   . Glaucoma   . Hyperlipidemia   . Hypertension   . Kidney stone     Past Surgical History:  Procedure Laterality Date  . ABDOMINAL HYSTERECTOMY    . ANTERIOR (CYSTOCELE) AND POSTERIOR REPAIR (RECTOCELE) WITH XENFORM GRAFT AND SACROSPINOUS FIXATION    . CATARACT EXTRACTION, BILATERAL    . INGUINAL HERNIA REPAIR Left     There were no vitals filed for this visit.  Subjective Assessment - 10/29/19 1405    Subjective  The patient is doing HEP regularly with some soreness in L shoulder.    Pertinent History  HTN (takes meds at lunch time due to lower blood pressure in the morning), depression, recently dx with Parkinsonism.    Patient Stated Goals  "Strengthen my core and my legs."    Currently in Pain?  No/denies                       [facility] Adult PT Treatment/Exercise - 10/29/19 1409      Bed Mobility   Bed Mobility  Rolling Right;Rolling Left    Rolling Right  Independent    Rolling Left  --   patient has slower, smaller amplitude movement to L     Ambulation/Gait   Ambulation/Gait  Yes    Ambulation/Gait Assistance  4: Min guard;5: Supervision    Ambulation/Gait Assistance Details  Treadmill  walking x 3 minutes to encourage longer R stride with bilat UE support at 1.1 mph with tactile cues occasionally to encourage longer step.    Ambulation Distance (Feet)  200 Feet   x 3 reps   Assistive device  None    Gait Comments  PT providing demo, tactile, verbal cues for longer stride and "reaching" to the floor with R heel.   Dynamic gait activities performing heel walking, toe walking, backwards walking.      Neuro Re-ed    Neuro Re-ed Details   Worked on rolling emphasizing large amplitude movements moving R and then L x 5 reps.  Standing large amplitude movements working on posterior weight shift<>return to standing, lateral stepping<>return to midline, and anterior stepping.  Figure 8 walking, sidestepping and braiding (min A to keep from falling to the right side).  Worked on tapping to cones in a circular movement for weight shifting.  Attempted quadriped (had intention to perform rocking ant/posterior for q-ped weight shifting), however patient notes L arm is too wear to hold her.        Exercises   Exercises  Knee/Hip      Knee/Hip Exercises: Stretches  Active Hamstring Stretch  30 seconds    Gastroc Stretch  Both;30 seconds      Knee/Hip Exercises: Aerobic   Nustep  L3: arms and legs x 4 minutes at end of session to encourage reciprocal pattern of movement.               PT Short Term Goals - 10/22/19 1644      PT SHORT TERM GOAL #1   Title  The patient will return demo HEP for balance, hip strength and general mobility.    Time  4    Period  Weeks    Target Date  11/21/19      PT SHORT TERM GOAL #2   Title  The patient will improve FGA from 13/30 to > or equal to 18/30 to demonstrate improved dynamic gait and reduced risk for falls.    Time  4    Period  Weeks    Target Date  11/21/19      PT SHORT TERM GOAL #3   Title  The patient will demonstrate a 360 degree turn in < or equal to 4 seconds.    Time  4    Period  Weeks    Target Date  11/21/19       PT SHORT TERM GOAL #4   Title  The patient will demonstrate improved R heel strike demonstrating 50% occurrence of R heel strike at initial stance of gait.    Time  4    Period  Weeks    Target Date  11/21/19        PT Long Term Goals - 10/22/19 1646      PT LONG TERM GOAL #1   Title  The patient will be indep with progression of HEP for post d/c.    Time  8    Period  Weeks    Target Date  12/21/19      PT LONG TERM GOAL #2   Title  The patient will improve gait speed from 2.37 ft/sec up to 2.62 ft sec to demo transition to community ambulator classification of gait.    Time  8    Period  Weeks    Target Date  12/21/19      PT LONG TERM GOAL #3   Title  The patient will improve Berg score from 49/56 to > or equal to 53/56 to demo dec'd risk for falls.    Time  8    Period  Weeks    Target Date  12/21/19      PT LONG TERM GOAL #4   Title  The patient will demo floor<>stand transfer with UE support due to h/o falls.    Time  8    Period  Weeks    Target Date  12/21/19            Plan - 10/29/19 1602    Clinical Impression Statement  The patient is demonstrating improved clearance of R foot during swing phase and improved heel strike at initial contact of stance phase.  She is tolerating exercises and NMR well.  PT to continue to progress to STGs/LTGs.    PT Treatment/Interventions  ADLs/Self Care Home Management;Gait training;Stair training;Functional mobility training;Therapeutic activities;Therapeutic exercise;Balance training;Neuromuscular re-education;Patient/family education;Passive range of motion    PT Next Visit Plan  progress to PWR! exercises, add supine PWR exercises to improve bed mobility, gait training *watch L shoulder (has pain)    PT Home Exercise Plan  Access  Code: [email or web address]    Consulted and Agree with Plan of Care  Patient       Patient will benefit from skilled therapeutic intervention in order to improve the following deficits and impairments:   Abnormal gait, Difficulty walking, Decreased balance, Decreased strength, Postural dysfunction  Visit Diagnosis: Unsteadiness on feet  Other abnormalities of gait and mobility  History of falling  Muscle weakness (generalized)     Problem List Patient Active Problem List   Diagnosis Date Noted  . Primary Parkinsonism ([place]) 10/26/2019  . Osteoporosis 09/15/2019  . Blister of great toe of left foot 08/19/2019  . 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 Moderate episode of recurrent major depressive disorder ([place]) 02/09/2019  . 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 Shuffling gait 02/09/2019  . Balance problems 02/09/2019  . 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  . Orthostatic hypotension 02/01/2019  . Dizziness 02/01/2019  . Primary osteoarthritis of right foot 09/25/2018  . Snoring 09/25/2018  . Night terrors, adult 09/25/2018  . Multiple falls 09/25/2018  . Metatarsalgia of left foot 08/20/2018  . Chronic foot pain, right 08/20/2018  . Left hip pain 04/10/2017  . History of shingles 04/08/2017  . Adenomatous polyp 04/08/2017  . Pure hypercholesterolemia 04/08/2017  . History of kidney stones 04/08/2017  . Essential hypertension 04/08/2017  . Depression, recurrent ([place]) 04/08/2017  . Anxiety 04/08/2017  . Greater trochanteric bursitis of left hip 04/08/2017    Henna Derderian, PT 10/29/2019, 4:04 PM  [facility] [place], [place], [address] Phone: [phone]   Fax:  [phone]  Name: Henri Kaz MRN: [id number] Date of Birth: 12-Apr-1941

## 2019-11-01 ENCOUNTER — Ambulatory Visit (INDEPENDENT_AMBULATORY_CARE_PROVIDER_SITE_OTHER): Payer: Medicare Other | Admitting: Physical Therapy

## 2019-11-01 ENCOUNTER — Other Ambulatory Visit: Payer: Self-pay

## 2019-11-01 DIAGNOSIS — R2681 Unsteadiness on feet: Secondary | ICD-10-CM

## 2019-11-01 DIAGNOSIS — M6281 Muscle weakness (generalized): Secondary | ICD-10-CM | POA: Diagnosis not present

## 2019-11-01 DIAGNOSIS — Z9181 History of falling: Secondary | ICD-10-CM | POA: Diagnosis not present

## 2019-11-01 DIAGNOSIS — R2689 Other abnormalities of gait and mobility: Secondary | ICD-10-CM | POA: Diagnosis not present

## 2019-11-01 NOTE — Therapy (Signed)
[place], [place], [address] Phone: [phone]   Fax:  [phone]  Physical Therapy Treatment  Patient Details  Name: Chelsey Estes MRN: [id number] Date of Birth: 02-26-1941 Referring Provider (PT): Tat, Eustace Quail, DO   Encounter Date: 11/01/2019  PT End of Session - 11/01/19 1654    Visit Number  4    Number of Visits  16    Date for PT Re-Evaluation  12/21/19    Authorization Type  medicare/BCBS    PT Start Time  1435    PT Stop Time  1516    PT Time Calculation (min)  41 min    Activity Tolerance  Patient tolerated treatment well    Behavior During Therapy  [facility] for tasks assessed/performed       Past Medical History:  Diagnosis Date  . Anxiety   . Depression   . Glaucoma   . Hyperlipidemia   . Hypertension   . Kidney stone     Past Surgical History:  Procedure Laterality Date  . ABDOMINAL HYSTERECTOMY    . ANTERIOR (CYSTOCELE) AND POSTERIOR REPAIR (RECTOCELE) WITH XENFORM GRAFT AND SACROSPINOUS FIXATION    . CATARACT EXTRACTION, BILATERAL    . INGUINAL HERNIA REPAIR Left     There were no vitals filed for this visit.  Subjective Assessment - 11/01/19 1440    Subjective  Pt reports she was very tired after last session. She feels her walk is getting better.  Her husband reminds her to stand up straight daily, "If I stand up straight, my walking is better".    Patient Stated Goals  "Strengthen my core and my legs."    Currently in Pain?  No/denies    Pain Score  0-No pain         [facility] PT Assessment - 11/01/19 0001      Assessment   Medical Diagnosis  G20 (ICD-10-CM) - Primary Parkinsonism ([place])    Referring Provider (PT)  Tat, Eustace Quail, DO    Onset Date/Surgical Date  10/14/19   recently dx with Primary Parkinsonism   Hand Dominance  Right    Next MD Visit  PRN    Prior Therapy  known to this clinic from prior physical therapy        [facility] Adult PT  Treatment/Exercise - 11/01/19 0001      Ambulation/Gait   Ambulation/Gait Assistance  5: Supervision    Ambulation Distance (Feet)  160 Feet    Assistive device  None    Gait Comments  PTA giving tactile cues/ VC to encourage reciprocal arm swing and VC for increased Rt heel strike.       Lumbar Exercises: Quadruped   Single Arm Raise  --   Rt/Lt x 2 reps   Straight Leg Raise  --   Rt/Lt x 3 reps    Other Quadruped Lumbar Exercises  weight shifting forward/ backward and side to side x 5 reps       Knee/Hip Exercises: Stretches   Other Knee/Hip Stretches  standing midlevel doorway stretch x 20 sec x 3 reps (to address shoulder tightness/soreness)       Knee/Hip Exercises: Aerobic   Recumbent Bike  L1: 4 min      Knee/Hip Exercises: Standing   Other Standing Knee Exercises  side to side weight shifts with opp side arm reaches cross body to target just inside BOS x 5 each way, then reaching target just above shoulder height  x 5 each side. SLS with opp foot stepping forward, side, and back working on stepping timing, balance, and motor control with CGA for safety.     Other Standing Knee Exercises  Toe taps to 3" cup on 3" step x 10 each leg, then alternating x 10 reps (intermittent UE support on rail)       Knee/Hip Exercises: Supine   Bridges  10 reps    Other Supine Knee/Hip Exercises  Rolling Rt/Lt with arms in T - power up twist  x 8 reps each side      Balance Exercises - 11/01/19 1532      Balance Exercises: Standing   Sidestepping  2 reps    Other Standing Exercises  braiding Lt/Rt 10 ft with CGA and intermittent UE support on counter x 2 reps (trouble uncrossing Rt foot from behind LLE)          PT Short Term Goals - 10/22/19 1644      PT SHORT TERM GOAL #1   Title  The patient will return demo HEP for balance, hip strength and general mobility.    Time  4    Period  Weeks    Target Date  11/21/19      PT SHORT TERM GOAL #2   Title  The patient will improve FGA  from 13/30 to > or equal to 18/30 to demonstrate improved dynamic gait and reduced risk for falls.    Time  4    Period  Weeks    Target Date  11/21/19      PT SHORT TERM GOAL #3   Title  The patient will demonstrate a 360 degree turn in < or equal to 4 seconds.    Time  4    Period  Weeks    Target Date  11/21/19      PT SHORT TERM GOAL #4   Title  The patient will demonstrate improved R heel strike demonstrating 50% occurrence of R heel strike at initial stance of gait.    Time  4    Period  Weeks    Target Date  11/21/19        PT Long Term Goals - 10/22/19 1646      PT LONG TERM GOAL #1   Title  The patient will be indep with progression of HEP for post d/c.    Time  8    Period  Weeks    Target Date  12/21/19      PT LONG TERM GOAL #2   Title  The patient will improve gait speed from 2.37 ft/sec up to 2.62 ft sec to demo transition to community ambulator classification of gait.    Time  8    Period  Weeks    Target Date  12/21/19      PT LONG TERM GOAL #3   Title  The patient will improve Berg score from 49/56 to > or equal to 53/56 to demo dec'd risk for falls.    Time  8    Period  Weeks    Target Date  12/21/19      PT LONG TERM GOAL #4   Title  The patient will demo floor<>stand transfer with UE support due to h/o falls.    Time  8    Period  Weeks    Target Date  12/21/19            Plan - 11/01/19 1649  Clinical Impression Statement  Pt demonstrating continued improvement in Rt foot clearance during swing phase of gait.  Pt demonstrated improved Rt rolling in supine today with repetition and cues.  Continued difficulty with Rt stepping strategy with Lt SLS, most difficult with retro step. Pt able to get into quadruped, but had difficulty tolerating position very long due to wrist discomfort with ext.   Pt making good progress towards established therapy goals.    Rehab Potential  Good    PT Frequency  2x / week    PT Duration  8 weeks    PT  Treatment/Interventions  ADLs/Self Care Home Management;Gait training;Stair training;Functional mobility training;Therapeutic activities;Therapeutic exercise;Balance training;Neuromuscular re-education;Patient/family education;Passive range of motion    PT Next Visit Plan  progress to PWR! exercises, gait training *watch L shoulder (has pain)    PT Home Exercise Plan  Access Code: [email or web address]    Consulted and Agree with Plan of Care  Patient       Patient will benefit from skilled therapeutic intervention in order to improve the following deficits and impairments:  Abnormal gait, Difficulty walking, Decreased balance, Decreased strength, Postural dysfunction  Visit Diagnosis: Unsteadiness on feet  Other abnormalities of gait and mobility  History of falling  Muscle weakness (generalized)     Problem List Patient Active Problem List   Diagnosis Date Noted  . Primary Parkinsonism ([place]) 10/26/2019  . Osteoporosis 09/15/2019  . Blister of great toe of left foot 08/19/2019  . 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 Moderate episode of recurrent major depressive disorder ([place]) 02/09/2019  . 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 Shuffling gait 02/09/2019  . Balance problems 02/09/2019  . 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  . Orthostatic hypotension 02/01/2019  . Dizziness 02/01/2019  . Primary osteoarthritis of right foot 09/25/2018  . Snoring 09/25/2018  . Night terrors, adult 09/25/2018  . Multiple falls 09/25/2018  . Metatarsalgia of left foot 08/20/2018  . Chronic foot pain, right 08/20/2018  . Left hip pain 04/10/2017  . History of shingles 04/08/2017  . Adenomatous polyp 04/08/2017  . Pure hypercholesterolemia 04/08/2017  . History of kidney stones 04/08/2017  . Essential hypertension 04/08/2017  . Depression, recurrent ([place]) 04/08/2017  . Anxiety 04/08/2017  . Greater trochanteric bursitis of left hip 04/08/2017   Kerin Perna, PTA 11/01/19 4:58  PM  [place], [place], [address] Phone: [phone]   Fax:  [phone]  Name: Afifa Nesser MRN: [id number] Date of Birth: 08-23-1941

## 2019-11-02 IMAGING — DX DG FOOT COMPLETE 3+V*L*
3 series · 3 of 3 positions shown · non-contrast
Comparison: None.

CLINICAL DATA: Left foot pain since an injury in a trip and fall 5
days ago.

EXAM:
LEFT FOOT - COMPLETE 3+ VIEW

[foot ap]
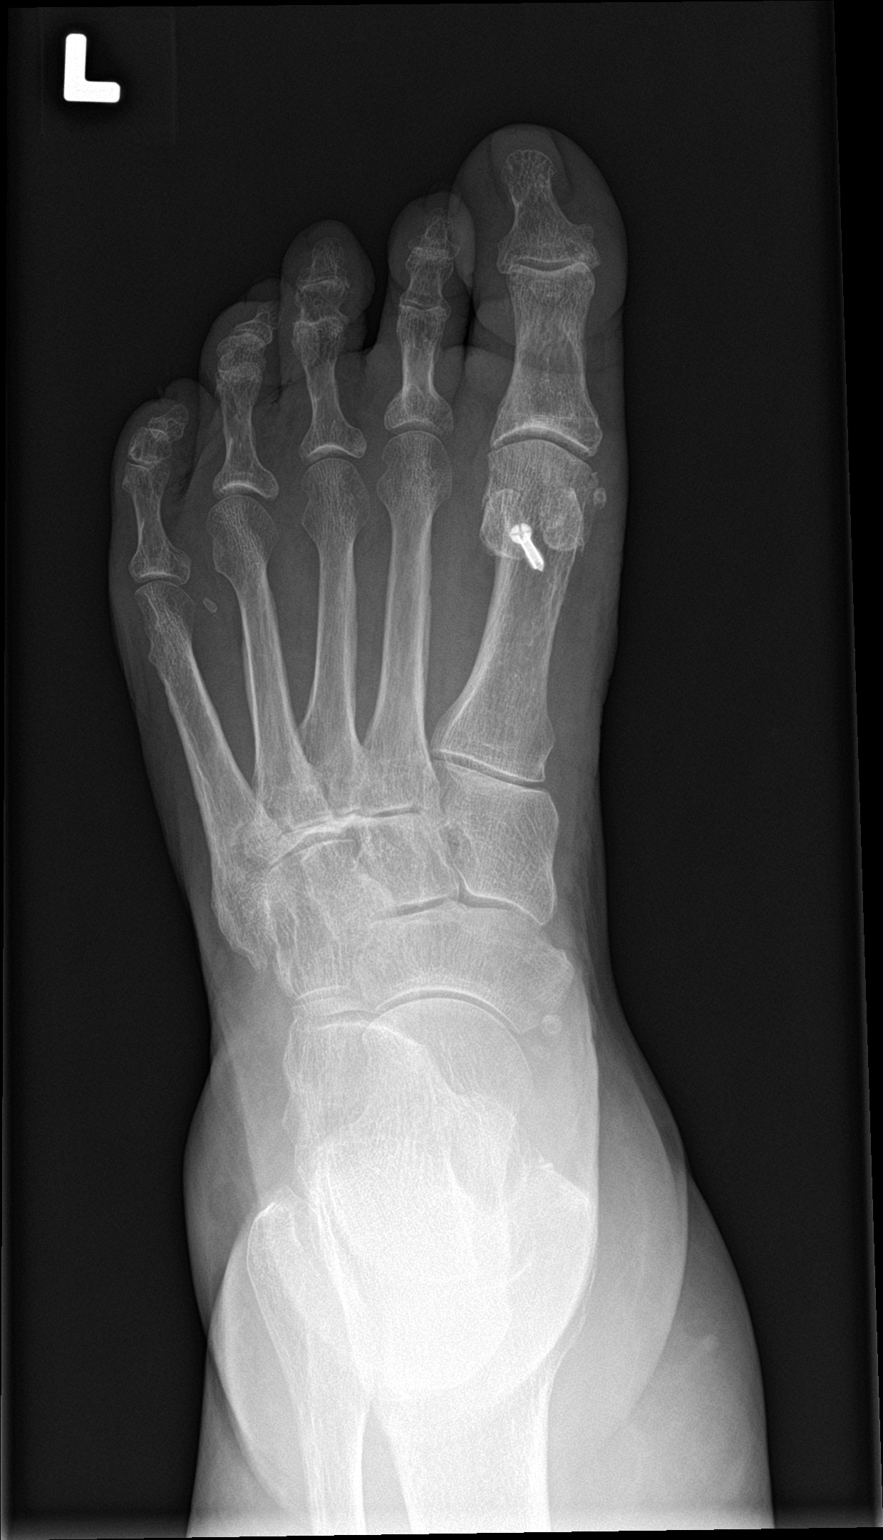

[foot obl]
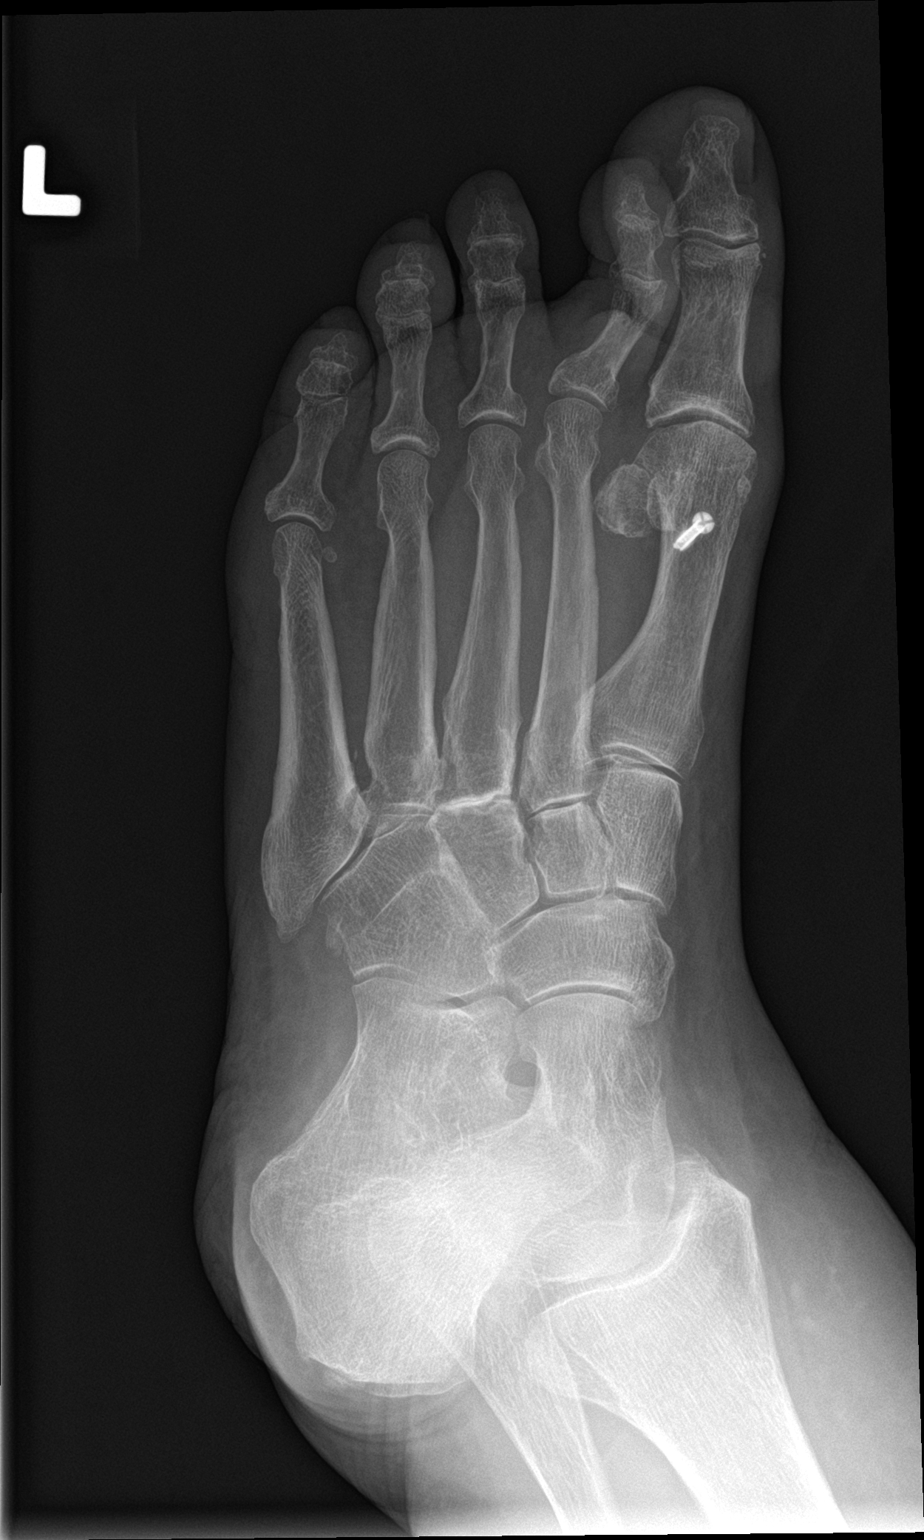

[foot lat]
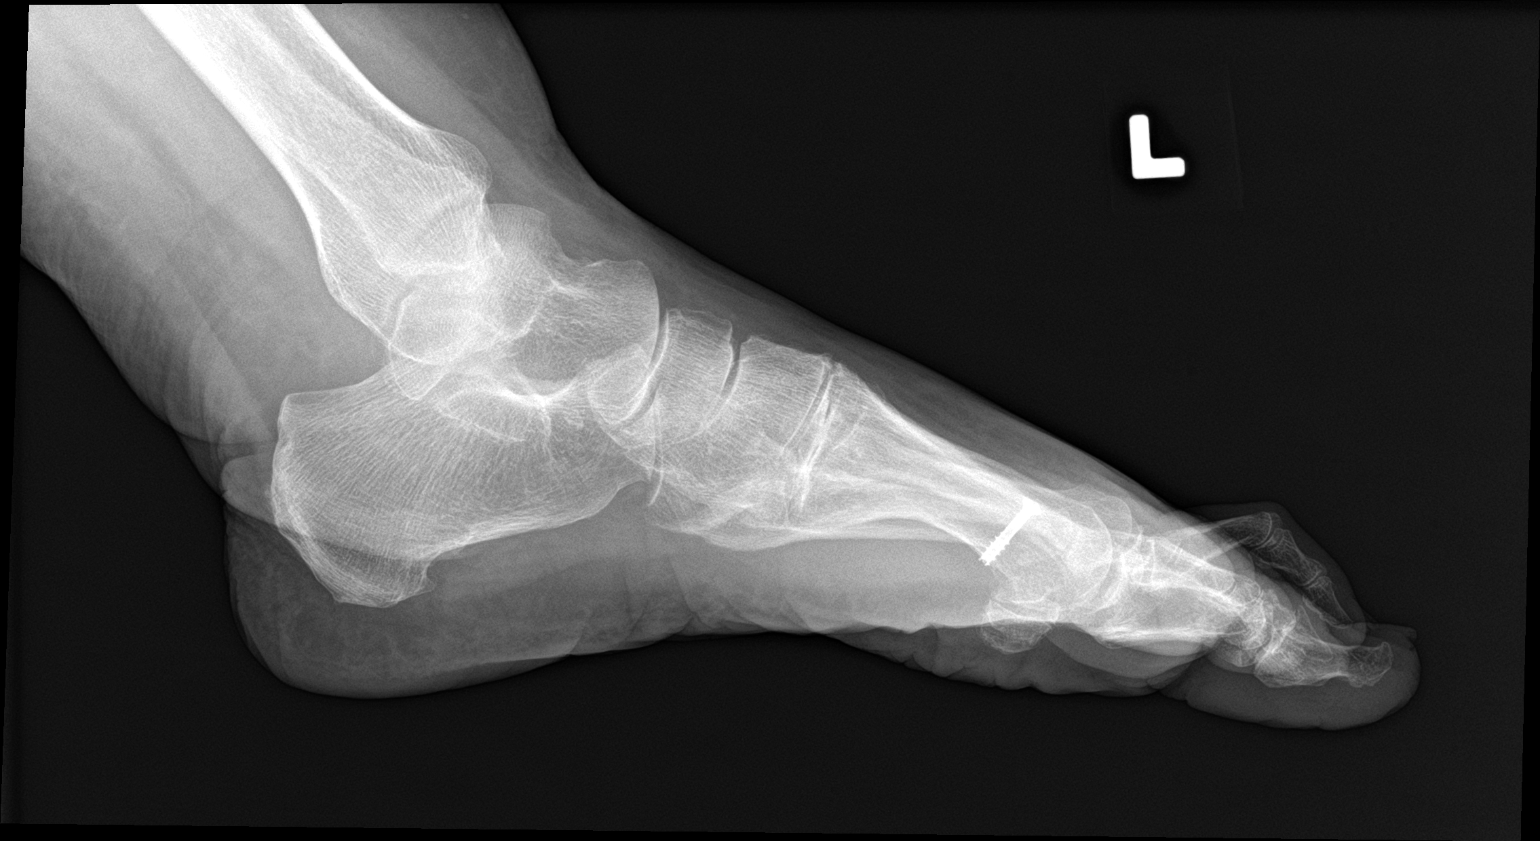

[3 of 3 positions shown; findings below may reference images not displayed]

FINDINGS: The patient is status post hallux valgus repair with a healed
osteotomy in the distal first metatarsal and a screw in place.
Bunionette has also been resected from the head of the fifth
metatarsal. No acute bony or joint abnormality is seen. Soft tissues
are unremarkable. Hammertoe deformity appears to be at the second
toe.
IMPRESSION: No acute abnormality.

## 2019-11-04 ENCOUNTER — Encounter: Payer: Self-pay | Admitting: Rehabilitative and Restorative Service Providers"

## 2019-11-04 ENCOUNTER — Ambulatory Visit (INDEPENDENT_AMBULATORY_CARE_PROVIDER_SITE_OTHER): Payer: Medicare Other | Admitting: Rehabilitative and Restorative Service Providers"

## 2019-11-04 ENCOUNTER — Other Ambulatory Visit: Payer: Self-pay

## 2019-11-04 DIAGNOSIS — R2681 Unsteadiness on feet: Secondary | ICD-10-CM

## 2019-11-04 DIAGNOSIS — Z9181 History of falling: Secondary | ICD-10-CM

## 2019-11-04 DIAGNOSIS — R42 Dizziness and giddiness: Secondary | ICD-10-CM | POA: Diagnosis not present

## 2019-11-04 DIAGNOSIS — R2689 Other abnormalities of gait and mobility: Secondary | ICD-10-CM | POA: Diagnosis not present

## 2019-11-04 DIAGNOSIS — M6281 Muscle weakness (generalized): Secondary | ICD-10-CM | POA: Diagnosis not present

## 2019-11-04 NOTE — Therapy (Signed)
[place]  [place], [place], [address] Phone: [phone]   Fax:  [phone]  Physical Therapy Treatment  Patient Details  Name: Chelsey Estes MRN: [id number] Date of Birth: [birth year] Referring Provider (PT): Tat, Eustace Quail, DO   Encounter Date: 11/04/2019  PT End of Session - 11/04/19 1700    Visit Number  5    Number of Visits  16    Date for PT Re-Evaluation  12/21/19    Authorization Type  medicare/BCBS    PT Start Time  [id number]    PT Stop Time  1615    PT Time Calculation (min)  44 min    Activity Tolerance  Patient tolerated treatment well    Behavior During Therapy  [facility] for tasks assessed/performed       Past Medical History:  Diagnosis Date  . Anxiety   . Depression   . Glaucoma   . Hyperlipidemia   . Hypertension   . Kidney stone     Past Surgical History:  Procedure Laterality Date  . ABDOMINAL HYSTERECTOMY    . ANTERIOR (CYSTOCELE) AND POSTERIOR REPAIR (RECTOCELE) WITH XENFORM GRAFT AND SACROSPINOUS FIXATION    . CATARACT EXTRACTION, BILATERAL    . INGUINAL HERNIA REPAIR Left     There were no vitals filed for this visit.  Subjective Assessment - 11/04/19 1535    Subjective  The patient reports she is doing her bike every day.  "Even my hair dresser told me I was walking better."  She has a hard time positioning in bed due to R sciatica and discomfort.    Pertinent History  HTN (takes meds at lunch time due to lower blood pressure in the morning), depression, recently dx with Parkinsonism.    Patient Stated Goals  "Strengthen my core and my legs."    Currently in Pain?  No/denies   L shoulder hurts intermittently                      [facility] Adult PT Treatment/Exercise - 11/04/19 1609      Ambulation/Gait   Ambulation/Gait  Yes    Ambulation/Gait Assistance  5: Supervision    Ambulation/Gait Assistance Details  PT provided verbal cues for longer stride length, arm  swing with gait, large amplitude movement for R foot clearance.  Performed large amplitude marching with UE alternating movements, backwards walking, dynamic gait with horizontal/vertical head motion, spotting targets while walking, and dual tasking (counting backward by 7's) during gait activities.    Ambulation Distance (Feet)  --   150 x 3 reps   Assistive device  None      Neuro Re-ed    Neuro Re-ed Details   Standing weight shifting activities for pre-work for turning in place,  Worked on large amplitude stepping with 180 degree turn, focusing on R LE longer stride to lead the movement.  Then performed standing large amplitude movement stepping into forward lunge<>return to midline, then right and left anterolateral stepping <>return to midline.  Feet in stride position rocking anterior/posteriorly with R UE reaching emphasizing power through back leg (R).  Then performed standing lateral stepping over target that is 8" off the ground to emphasize power with movement.  Supine:  wide leg marching R and L sides, rolling supine<>R and L sidelying.        Exercises   Exercises  Ankle      Ankle Exercises: Stretches   Gastroc Stretch  30 seconds;2  reps               PT Short Term Goals - 10/22/19 1644      PT SHORT TERM GOAL #1   Title  The patient will return demo HEP for balance, hip strength and general mobility.    Time  4    Period  Weeks    Target Date  11/21/19      PT SHORT TERM GOAL #2   Title  The patient will improve FGA from 13/30 to > or equal to 18/30 to demonstrate improved dynamic gait and reduced risk for falls.    Time  4    Period  Weeks    Target Date  11/21/19      PT SHORT TERM GOAL #3   Title  The patient will demonstrate a 360 degree turn in < or equal to 4 seconds.    Time  4    Period  Weeks    Target Date  11/21/19      PT SHORT TERM GOAL #4   Title  The patient will demonstrate improved R heel strike demonstrating 50% occurrence of R heel  strike at initial stance of gait.    Time  4    Period  Weeks    Target Date  11/21/19        PT Long Term Goals - 10/22/19 1646      PT LONG TERM GOAL #1   Title  The patient will be indep with progression of HEP for post d/c.    Time  8    Period  Weeks    Target Date  12/21/19      PT LONG TERM GOAL #2   Title  The patient will improve gait speed from 2.37 ft/sec up to 2.62 ft sec to demo transition to community ambulator classification of gait.    Time  8    Period  Weeks    Target Date  12/21/19      PT LONG TERM GOAL #3   Title  The patient will improve Berg score from 49/56 to > or equal to 53/56 to demo dec'd risk for falls.    Time  8    Period  Weeks    Target Date  12/21/19      PT LONG TERM GOAL #4   Title  The patient will demo floor<>stand transfer with UE support due to h/o falls.    Time  8    Period  Weeks    Target Date  12/21/19            Plan - 11/04/19 1700    Clinical Impression Statement  The patient is demonstrating improved R foot clearance with gait, except during dual tasking/multi-tasking environments.  The patient is improving power with gait and stepping strategies for functional mobility.    PT Treatment/Interventions  ADLs/Self Care Home Management;Gait training;Stair training;Functional mobility training;Therapeutic activities;Therapeutic exercise;Balance training;Neuromuscular re-education;Patient/family education;Passive range of motion    PT Next Visit Plan  progress to PWR! exercises, gait training *watch L shoulder (has pain)    PT Home Exercise Plan  Access Code: [email or web address]    Consulted and Agree with Plan of Care  Patient       Patient will benefit from skilled therapeutic intervention in order to improve the following deficits and impairments:  Abnormal gait, Difficulty walking, Decreased balance, Decreased strength, Postural dysfunction  Visit Diagnosis: Unsteadiness on feet  Other abnormalities of gait and  mobility  History of falling  Muscle weakness (generalized)  Dizziness and giddiness     Problem List Patient Active Problem List   Diagnosis Date Noted  . Primary Parkinsonism ([place]) 10/26/2019  . Osteoporosis 09/15/2019  . Blister of great toe of left foot 08/19/2019  . 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 Moderate episode of recurrent major depressive disorder ([place]) 02/09/2019  . 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 Shuffling gait 02/09/2019  . Balance problems 02/09/2019  . 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  . Orthostatic hypotension 02/01/2019  . Dizziness 02/01/2019  . Primary osteoarthritis of right foot 09/25/2018  . Snoring 09/25/2018  . Night terrors, adult 09/25/2018  . Multiple falls 09/25/2018  . Metatarsalgia of left foot 08/20/2018  . Chronic foot pain, right 08/20/2018  . Left hip pain 04/10/2017  . History of shingles 04/08/2017  . Adenomatous polyp 04/08/2017  . Pure hypercholesterolemia 04/08/2017  . History of kidney stones 04/08/2017  . Essential hypertension 04/08/2017  . Depression, recurrent ([place]) 04/08/2017  . Anxiety 04/08/2017  . Greater trochanteric bursitis of left hip 04/08/2017    Mose Colaizzi, PT 11/04/2019, [place], [place], [address] Phone: [phone]   Fax:  [phone]  Name: Jincy Konicek MRN: [id number] Date of Birth: 01-16-1941

## 2019-11-08 ENCOUNTER — Other Ambulatory Visit: Payer: Self-pay

## 2019-11-08 ENCOUNTER — Ambulatory Visit (INDEPENDENT_AMBULATORY_CARE_PROVIDER_SITE_OTHER): Payer: Medicare Other | Admitting: Rehabilitative and Restorative Service Providers"

## 2019-11-08 DIAGNOSIS — R2689 Other abnormalities of gait and mobility: Secondary | ICD-10-CM

## 2019-11-08 DIAGNOSIS — M6281 Muscle weakness (generalized): Secondary | ICD-10-CM

## 2019-11-08 DIAGNOSIS — R42 Dizziness and giddiness: Secondary | ICD-10-CM

## 2019-11-08 DIAGNOSIS — Z9181 History of falling: Secondary | ICD-10-CM | POA: Diagnosis not present

## 2019-11-08 DIAGNOSIS — R2681 Unsteadiness on feet: Secondary | ICD-10-CM | POA: Diagnosis not present

## 2019-11-08 NOTE — Therapy (Signed)
[facility]  [place], [place], [address] Phone: [phone]   Fax:  [phone]  Physical Therapy Treatment  Patient Details  Name: Chelsey Estes MRN: [id number] Date of Birth: [birth year] Referring Provider (PT): Tat, Eustace Quail, DO   Encounter Date: 11/08/2019  PT End of Session - 11/08/19 1912    Visit Number  6    Number of Visits  16    Date for PT Re-Evaluation  12/21/19    Authorization Type  medicare/BCBS    PT Start Time  1345    PT Stop Time  1430    PT Time Calculation (min)  45 min    Activity Tolerance  Patient tolerated treatment well    Behavior During Therapy  [facility] for tasks assessed/performed       Past Medical History:  Diagnosis Date  . Anxiety   . Depression   . Glaucoma   . Hyperlipidemia   . Hypertension   . Kidney stone     Past Surgical History:  Procedure Laterality Date  . ABDOMINAL HYSTERECTOMY    . ANTERIOR (CYSTOCELE) AND POSTERIOR REPAIR (RECTOCELE) WITH XENFORM GRAFT AND SACROSPINOUS FIXATION    . CATARACT EXTRACTION, BILATERAL    . INGUINAL HERNIA REPAIR Left     There were no vitals filed for this visit.  Subjective Assessment - 11/08/19 1345    Subjective  The patient notes she caught herself with the right foot while walking, but was able to recover.  Overall, she notes better balance.    Pertinent History  HTN (takes meds at lunch time due to lower blood pressure in the morning), depression, recently dx with Parkinsonism.    Patient Stated Goals  "Strengthen my core and my legs."    Currently in Pain?  No/denies         [facility] PT Assessment - 11/08/19 1408      Berg Balance Test   Sit to Stand  Able to stand without using hands and stabilize independently    Standing Unsupported  Able to stand safely 2 minutes    Sitting with Back Unsupported but Feet Supported on Floor or Stool  Able to sit safely and securely 2 minutes    Stand to Sit  Sits safely with  minimal use of hands    Transfers  Able to transfer safely, minor use of hands    Standing Unsupported with Eyes Closed  Able to stand 10 seconds safely    Standing Unsupported with Feet Together  Able to place feet together independently and stand 1 minute safely    From Standing, Reach Forward with Outstretched Arm  Can reach confidently >25 cm (10")    From Standing Position, Pick up Object from Floor  Able to pick up shoe safely and easily    From Standing Position, Turn to Look Behind Over each Shoulder  Looks behind from both sides and weight shifts well    Turn 360 Degrees  Able to turn 360 degrees safely in 4 seconds or less    Standing Unsupported, Alternately Place Feet on Step/Stool  Able to stand independently and safely and complete 8 steps in 20 seconds    Standing Unsupported, One Foot in Front  Able to place foot tandem independently and hold 30 seconds    Standing on One Leg  Able to lift leg independently and hold > 10 seconds    Total Score  56    Berg comment:  56/56  Functional Gait  Assessment   Gait assessed   Yes    Gait Level Surface  Walks 20 ft in less than 7 sec but greater than 5.5 sec, uses assistive device, slower speed, mild gait deviations, or deviates 6-10 in outside of the 12 in walkway width.    Change in Gait Speed  Able to change speed, demonstrates mild gait deviations, deviates 6-10 in outside of the 12 in walkway width, or no gait deviations, unable to achieve a major change in velocity, or uses a change in velocity, or uses an assistive device.    Gait with Horizontal Head Turns  Performs head turns smoothly with slight change in gait velocity (eg, minor disruption to smooth gait path), deviates 6-10 in outside 12 in walkway width, or uses an assistive device.    Gait with Vertical Head Turns  Performs task with slight change in gait velocity (eg, minor disruption to smooth gait path), deviates 6 - 10 in outside 12 in walkway width or uses assistive  device    Gait and Pivot Turn  Pivot turns safely in greater than 3 sec and stops with no loss of balance, or pivot turns safely within 3 sec and stops with mild imbalance, requires small steps to catch balance.    Step Over Obstacle  Is able to step over one shoe box (4.5 in total height) but must slow down and adjust steps to clear box safely. May require verbal cueing.    Gait with Narrow Base of Support  Is able to ambulate for 10 steps heel to toe with no staggering.    Gait with Eyes Closed  Walks 20 ft, uses assistive device, slower speed, mild gait deviations, deviates 6-10 in outside 12 in walkway width. Ambulates 20 ft in less than 9 sec but greater than 7 sec.    Ambulating Backwards  Walks 20 ft, uses assistive device, slower speed, mild gait deviations, deviates 6-10 in outside 12 in walkway width.    Steps  Alternating feet, must use rail.    Total Score  20    FGA comment:  20/30                   [facility] Adult PT Treatment/Exercise - 11/08/19 1408      Ambulation/Gait   Ambulation/Gait  Yes    Ambulation/Gait Assistance  5: Supervision;6: Modified independent (Device/Increase time)    Ambulation/Gait Assistance Details  PT providing cues for longer stride and arm swing.  Dynamic gait activities performing forward walking/ backwards walking, sidestepping, turning, direction changes.    Ambulation Distance (Feet)  300 Feet    Assistive device  None    Gait velocity  2.46 ft/sec      Self-Care   Self-Care  Other Self-Care Comments    Other Self-Care Comments   Discussed options for clipping her toenails recommending a stool to place in front of her to allow her to reach toes for hygeine.      Therapeutic Activites    Therapeutic Activities  Other Therapeutic Activities    Other Therapeutic Activities  Floor<>stand with yoga mat on the ground first demonstrating with UE use on mat table and then with pressing up through floor with UEs.       Neuro Re-ed    Neuro  Re-ed Details   Seated trunk rotation with cross body reaching PWR up x 5 reps to each side, lateral learning with contralateral UE overhead reaching x 5 reps each side.  Quadriped <>tall kneeling for postural powering up.  Tall<>1/2 kneeling x 5 reps each side with CGA with UE support through physioball.        Exercises   Exercises  Lumbar      Lumbar Exercises: Stretches   Piriformis Stretch  2 reps;30 seconds    Piriformis Stretch Limitations  seated and supine    Other Lumbar Stretch Exercise  Lumbar rocking supine               PT Short Term Goals - 11/08/19 1353      PT SHORT TERM GOAL #1   Title  The patient will return demo HEP for balance, hip strength and general mobility.    Time  4    Period  Weeks    Status  Achieved    Target Date  11/21/19      PT SHORT TERM GOAL #2   Title  The patient will improve FGA from 13/30 to > or equal to 18/30 to demonstrate improved dynamic gait and reduced risk for falls.    Baseline  20/30 on FGA    Time  4    Period  Weeks    Status  Achieved    Target Date  11/21/19      PT SHORT TERM GOAL #3   Title  The patient will demonstrate a 360 degree turn in < or equal to 4 seconds.    Time  4    Period  Weeks    Status  Achieved    Target Date  11/21/19      PT SHORT TERM GOAL #4   Title  The patient will demonstrate improved R heel strike demonstrating 50% occurrence of R heel strike at initial stance of gait.    Time  4    Period  Weeks    Status  Achieved    Target Date  11/21/19        PT Long Term Goals - 11/08/19 1429      PT LONG TERM GOAL #1   Title  The patient will be indep with progression of HEP for post d/c.    Time  8    Period  Weeks    Status  On-going      PT LONG TERM GOAL #2   Title  The patient will improve gait speed from 2.37 ft/sec up to 2.62 ft sec to demo transition to community ambulator classification of gait.    Time  8    Period  Weeks    Status  On-going      PT LONG TERM GOAL  #3   Title  The patient will improve Berg score from 49/56 to > or equal to 53/56 to demo dec'd risk for falls.    Baseline  56/56    Time  8    Period  Weeks    Status  Achieved      PT LONG TERM GOAL #4   Title  The patient will demo floor<>stand transfer with UE support due to h/o falls.    Time  8    Period  Weeks    Status  Achieved            Plan - 11/08/19 1429    Clinical Impression Statement  The patient met STGs and 2 LTGs.  She has significantly improved with static balance per Merrilee Jansky and dynamic gait per FGA.  Although she no longer scores in high fall risk range,  she still has intermittent episodes of R foot drag due to dec'd clearance with gait.  PT continuing to progress high level balance, dynamic gait, functional strength to improve mobility.    PT Treatment/Interventions  ADLs/Self Care Home Management;Gait training;Stair training;Functional mobility training;Therapeutic activities;Therapeutic exercise;Balance training;Neuromuscular re-education;Patient/family education;Passive range of motion    PT Next Visit Plan  next session:  upgrade HEP and progress with PWR exercises, consider reduction to 1x/week -- will discuss with patient-- due to meeting LTGs early.  Investigate if POP program is currently active.    PT Home Exercise Plan  Access Code: [email or web address]    Consulted and Agree with Plan of Care  Patient       Patient will benefit from skilled therapeutic intervention in order to improve the following deficits and impairments:  Abnormal gait, Difficulty walking, Decreased balance, Decreased strength, Postural dysfunction  Visit Diagnosis: Unsteadiness on feet  Other abnormalities of gait and mobility  History of falling  Muscle weakness (generalized)  Dizziness and giddiness     Problem List Patient Active Problem List   Diagnosis Date Noted  . Primary Parkinsonism ([place]) 10/26/2019  . Osteoporosis 09/15/2019  . Blister of great toe of left foot  08/19/2019  . 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 Moderate episode of recurrent major depressive disorder ([place]) 02/09/2019  . 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 Shuffling gait 02/09/2019  . Balance problems 02/09/2019  . 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  . Orthostatic hypotension 02/01/2019  . Dizziness 02/01/2019  . Primary osteoarthritis of right foot 09/25/2018  . Snoring 09/25/2018  . Night terrors, adult 09/25/2018  . Multiple falls 09/25/2018  . Metatarsalgia of left foot 08/20/2018  . Chronic foot pain, right 08/20/2018  . Left hip pain 04/10/2017  . History of shingles 04/08/2017  . Adenomatous polyp 04/08/2017  . Pure hypercholesterolemia 04/08/2017  . History of kidney stones 04/08/2017  . Essential hypertension 04/08/2017  . Depression, recurrent ([place]) 04/08/2017  . Anxiety 04/08/2017  . Greater trochanteric bursitis of left hip 04/08/2017    Boaz Berisha, PT 11/08/2019, 7:36 PM  [facility] [place], [place], [address] Phone: [phone]   Fax:  [phone]  Name: Ayeshia Coppin MRN: [id number] Date of Birth: 1941-02-22

## 2019-11-10 ENCOUNTER — Ambulatory Visit (INDEPENDENT_AMBULATORY_CARE_PROVIDER_SITE_OTHER): Payer: Medicare Other | Admitting: Psychology

## 2019-11-10 DIAGNOSIS — F339 Major depressive disorder, recurrent, unspecified: Secondary | ICD-10-CM | POA: Diagnosis not present

## 2019-11-15 ENCOUNTER — Ambulatory Visit (INDEPENDENT_AMBULATORY_CARE_PROVIDER_SITE_OTHER): Payer: Medicare Other | Admitting: Rehabilitative and Restorative Service Providers"

## 2019-11-15 ENCOUNTER — Other Ambulatory Visit: Payer: Self-pay

## 2019-11-15 ENCOUNTER — Encounter: Payer: Self-pay | Admitting: Rehabilitative and Restorative Service Providers"

## 2019-11-15 DIAGNOSIS — M6281 Muscle weakness (generalized): Secondary | ICD-10-CM | POA: Diagnosis not present

## 2019-11-15 DIAGNOSIS — R42 Dizziness and giddiness: Secondary | ICD-10-CM

## 2019-11-15 DIAGNOSIS — Z9181 History of falling: Secondary | ICD-10-CM | POA: Diagnosis not present

## 2019-11-15 DIAGNOSIS — R2681 Unsteadiness on feet: Secondary | ICD-10-CM | POA: Diagnosis not present

## 2019-11-15 DIAGNOSIS — R2689 Other abnormalities of gait and mobility: Secondary | ICD-10-CM

## 2019-11-15 NOTE — Therapy (Signed)
[place], [place], [address] Phone: [phone]   Fax:  [phone]  Physical Therapy Treatment  Patient Details  Name: Chelsey Estes MRN: [id number] Date of Birth: [birth year] Referring Provider (PT): Tat, Eustace Quail, DO   Encounter Date: 11/15/2019  PT End of Session - 11/15/19 1349    Visit Number  7    Number of Visits  16    Date for PT Re-Evaluation  12/21/19    Authorization Type  medicare/BCBS    PT Start Time  1345    PT Stop Time  1428    PT Time Calculation (min)  43 min    Activity Tolerance  Patient tolerated treatment well    Behavior During Therapy  [facility] for tasks assessed/performed       Past Medical History:  Diagnosis Date  . Anxiety   . Depression   . Glaucoma   . Hyperlipidemia   . Hypertension   . Kidney stone     Past Surgical History:  Procedure Laterality Date  . ABDOMINAL HYSTERECTOMY    . ANTERIOR (CYSTOCELE) AND POSTERIOR REPAIR (RECTOCELE) WITH XENFORM GRAFT AND SACROSPINOUS FIXATION    . CATARACT EXTRACTION, BILATERAL    . INGUINAL HERNIA REPAIR Left     There were no vitals filed for this visit.  Subjective Assessment - 11/15/19 1347    Subjective  The patient reports she is doing exercises regularly.  She had back pain on Saturday and took pain meds, which helped her shoulder too.    Pertinent History  HTN (takes meds at lunch time due to lower blood pressure in the morning), depression, recently dx with Parkinsonism.    Patient Stated Goals  "Strengthen my core and my legs."    Currently in Pain?  Yes    Pain Location  Back    Pain Descriptors / Indicators  Aching    Pain Type  Chronic pain    Pain Onset  More than a month ago    Pain Frequency  Intermittent    Effect of Pain on Daily Activities  back catches if she moves a certain way                       [facility] Adult PT Treatment/Exercise - 11/15/19 1427      Ambulation/Gait   Ambulation/Gait  Yes    Ambulation/Gait Assistance  6: Modified independent (Device/Increase time)    Ambulation Distance (Feet)  300 Feet    Assistive device  None    Gait velocity  2.77 ft/sec    Gait Comments  Gait focusing on longer stride length, sidesteping, high knee marching with CGA due to imbalance.        Neuro Re-ed    Neuro Re-ed Details   Sidestepping with SBA, braiding with CGA.  Large amplitude lateral stepping working on improving R foot clearance when stepping over 16".        PWR [facility]) - 11/15/19 1425    PWR! Twist  Right and Left sides x 5 reps each for trunk opening    PWR! Up  Prone on elbows with alternate LE reaching    PWR! Up  10 reps with postural cues    PWR! Rock  10 reps to each side    PWR! Twist  x 6 reps each side and cues for upright posture-- did not provide for HEP due to chest leaning versus lunging with legs  PWR Step  x 10 reps each side    PWR! Rock and [employer] Turn  x 5 reps          PT Education - 11/15/19 2121    Education Details  updated HEP adding PWR exercises    Person(s) Educated  Patient    Methods  Explanation;Demonstration;Handout    Comprehension  Verbalized understanding;Returned demonstration       PT Short Term Goals - 11/08/19 1353      PT SHORT TERM GOAL #1   Title  The patient will return demo HEP for balance, hip strength and general mobility.    Time  4    Period  Weeks    Status  Achieved    Target Date  11/21/19      PT SHORT TERM GOAL #2   Title  The patient will improve FGA from 13/30 to > or equal to 18/30 to demonstrate improved dynamic gait and reduced risk for falls.    Baseline  20/30 on FGA    Time  4    Period  Weeks    Status  Achieved    Target Date  11/21/19      PT SHORT TERM GOAL #3   Title  The patient will demonstrate a 360 degree turn in < or equal to 4 seconds.    Time  4    Period  Weeks    Status  Achieved    Target Date  11/21/19      PT SHORT TERM GOAL #4   Title  The  patient will demonstrate improved R heel strike demonstrating 50% occurrence of R heel strike at initial stance of gait.    Time  4    Period  Weeks    Status  Achieved    Target Date  11/21/19        PT Long Term Goals - 11/15/19 1414      PT LONG TERM GOAL #1   Title  The patient will be indep with progression of HEP for post d/c.    Time  8    Period  Weeks    Status  On-going      PT LONG TERM GOAL #2   Title  The patient will improve gait speed from 2.37 ft/sec up to 2.62 ft sec to demo transition to community ambulator classification of gait.    Baseline  2.77 ft/sec    Time  8    Period  Weeks    Status  Achieved      PT LONG TERM GOAL #3   Title  The patient will improve Berg score from 49/56 to > or equal to 53/56 to demo dec'd risk for falls.    Baseline  56/56    Time  8    Period  Weeks    Status  Achieved      PT LONG TERM GOAL #4   Title  The patient will demo floor<>stand transfer with UE support due to h/o falls.    Time  8    Period  Weeks    Status  Achieved            Plan - 11/15/19 2131    Clinical Impression Statement  The patient met LTG for gait speed.  PT progressed HEP adding PWR standing exercises.  Patient is able to demonstrate improvements with gait, however during multi-tasking and dynamic gait the R LE has dec'd foot clearance.  PT Treatment/Interventions  ADLs/Self Care Home Management;Gait training;Stair training;Functional mobility training;Therapeutic activities;Therapeutic exercise;Balance training;Neuromuscular re-education;Patient/family education;Passive range of motion    PT Next Visit Plan  Check HEP progression, reduce frequency to 1x/week working on balance, R LE motor control.  Begin d/c planning.    PT Home Exercise Plan  Access Code: [email or web address]    Consulted and Agree with Plan of Care  Patient       Patient will benefit from skilled therapeutic intervention in order to improve the following deficits and impairments:   Abnormal gait, Difficulty walking, Decreased balance, Decreased strength, Postural dysfunction  Visit Diagnosis: Unsteadiness on feet  Other abnormalities of gait and mobility  History of falling  Muscle weakness (generalized)  Dizziness and giddiness     Problem List Patient Active Problem List   Diagnosis Date Noted  . Primary Parkinsonism ([place]) 10/26/2019  . Osteoporosis 09/15/2019  . Blister of great toe of left foot 08/19/2019  . 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 Moderate episode of recurrent major depressive disorder ([place]) 02/09/2019  . 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 Shuffling gait 02/09/2019  . Balance problems 02/09/2019  . 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  . Orthostatic hypotension 02/01/2019  . Dizziness 02/01/2019  . Primary osteoarthritis of right foot 09/25/2018  . Snoring 09/25/2018  . Night terrors, adult 09/25/2018  . Multiple falls 09/25/2018  . Metatarsalgia of left foot 08/20/2018  . Chronic foot pain, right 08/20/2018  . Left hip pain 04/10/2017  . History of shingles 04/08/2017  . Adenomatous polyp 04/08/2017  . Pure hypercholesterolemia 04/08/2017  . History of kidney stones 04/08/2017  . Essential hypertension 04/08/2017  . Depression, recurrent ([place]) 04/08/2017  . Anxiety 04/08/2017  . Greater trochanteric bursitis of left hip 04/08/2017    Caty Tessler, PT 11/15/2019, 9:42 PM  [facility] [place], [place], [address] Phone: [phone]   Fax:  [phone]  Name: Mayme Profeta MRN: [id number] Date of Birth: Oct 05, 1941

## 2019-11-18 ENCOUNTER — Other Ambulatory Visit: Payer: Self-pay

## 2019-11-18 ENCOUNTER — Encounter: Payer: Self-pay | Admitting: Rehabilitative and Restorative Service Providers"

## 2019-11-18 ENCOUNTER — Ambulatory Visit (INDEPENDENT_AMBULATORY_CARE_PROVIDER_SITE_OTHER): Payer: Medicare Other | Admitting: Rehabilitative and Restorative Service Providers"

## 2019-11-18 DIAGNOSIS — R2681 Unsteadiness on feet: Secondary | ICD-10-CM | POA: Diagnosis not present

## 2019-11-18 DIAGNOSIS — R2689 Other abnormalities of gait and mobility: Secondary | ICD-10-CM

## 2019-11-18 DIAGNOSIS — Z9181 History of falling: Secondary | ICD-10-CM | POA: Diagnosis not present

## 2019-11-18 DIAGNOSIS — R42 Dizziness and giddiness: Secondary | ICD-10-CM

## 2019-11-18 DIAGNOSIS — M6281 Muscle weakness (generalized): Secondary | ICD-10-CM

## 2019-11-18 NOTE — Patient Instructions (Signed)
Hamstring Stretch DO THIS IN A CHAIR   Inhale and straighten spine. Exhale and lean forward toward extended leg. Hold position for _20 SECONDS. Inhale and come back to center. Repeat with other leg extended. Repeat _2__ times, alternating legs. Do _1-2__ times per day.  Copyright  VHI. All rights reserved.

## 2019-11-18 NOTE — Therapy (Signed)
[place], [place], [address] Phone: [phone]   Fax:  [phone]  Physical Therapy Treatment  Patient Details  Name: Chelsey Estes MRN: [id number] Date of Birth: 1941/11/10 Referring Provider (PT): Tat, Eustace Quail, DO   Encounter Date: 11/18/2019  PT End of Session - 11/18/19 1404    Visit Number  8    Number of Visits  16    Date for PT Re-Evaluation  12/21/19    Authorization Type  medicare/BCBS    PT Start Time  [id number]    PT Stop Time  1440    PT Time Calculation (min)  43 min    Activity Tolerance  Patient tolerated treatment well    Behavior During Therapy  [facility] for tasks assessed/performed       Past Medical History:  Diagnosis Date  . Anxiety   . Depression   . Glaucoma   . Hyperlipidemia   . Hypertension   . Kidney stone     Past Surgical History:  Procedure Laterality Date  . ABDOMINAL HYSTERECTOMY    . ANTERIOR (CYSTOCELE) AND POSTERIOR REPAIR (RECTOCELE) WITH XENFORM GRAFT AND SACROSPINOUS FIXATION    . CATARACT EXTRACTION, BILATERAL    . INGUINAL HERNIA REPAIR Left     There were no vitals filed for this visit.  Subjective Assessment - 11/18/19 1401    Subjective  The patient has a sore toe today (L great toe)-- began yesterday (she noticed it when wearing her crocs yesterday).  "It's just not my day."    Pertinent History  HTN (takes meds at lunch time due to lower blood pressure in the morning), depression, recently dx with Parkinsonism.    Patient Stated Goals  "Strengthen my core and my legs."    Currently in Pain?  Yes    Pain Score  0-No pain    Pain Location  Toe (Comment which one)   L great toe   Pain Orientation  Left    Pain Descriptors / Indicators  Sore    Pain Type  Acute pain    Pain Onset  In the past 7 days    Pain Frequency  Intermittent    Aggravating Factors   walking    Pain Relieving Factors  rest                       [facility]  Adult PT Treatment/Exercise - 11/18/19 1434      Ambulation/Gait   Ambulation/Gait  Yes    Ambulation/Gait Assistance  6: Modified independent (Device/Increase time)    Ambulation/Gait Assistance Details  Cues and demo emphasizing longer stride, "reaching" to ground with heel to extend knee at terminal swing phase.  Direction changing during gait with starts, stops, forward/backwards walking, sidestepping, crossover stepping with CGA.    Ambulation Distance (Feet)  400 Feet    Assistive device  None    Gait Comments  multi-tasking with gait activities with ball toss, counting backwards from 3's with dec'd R foot clearance.      Neuro Re-ed    Neuro Re-ed Details   Standing on compliant surfaces stepping laterally R and L x 10 reps with CGA, anteriorly R and L x 10 with CGA and posteriorly with CGA R and L x 10 reps.  Performed turning in [place] degree turns, stepping laterally across LEs and return to midline.  PWR exercises emphasizing large amplitude movements stepping posteriorly, laterally 10 reps each side.  Exercises   Exercises  Knee/Hip      Knee/Hip Exercises: Stretches   Active Hamstring Stretch  2 reps;30 seconds             PT Education - 11/18/19 1742    Education Details  HEP updated    Person(s) Educated  Patient    Methods  Explanation;Demonstration;Handout    Comprehension  Verbalized understanding;Returned demonstration       PT Short Term Goals - 11/08/19 1353      PT SHORT TERM GOAL #1   Title  The patient will return demo HEP for balance, hip strength and general mobility.    Time  4    Period  Weeks    Status  Achieved    Target Date  11/21/19      PT SHORT TERM GOAL #2   Title  The patient will improve FGA from 13/30 to > or equal to 18/30 to demonstrate improved dynamic gait and reduced risk for falls.    Baseline  20/30 on FGA    Time  4    Period  Weeks    Status  Achieved    Target Date  11/21/19      PT SHORT TERM GOAL #3    Title  The patient will demonstrate a 360 degree turn in < or equal to 4 seconds.    Time  4    Period  Weeks    Status  Achieved    Target Date  11/21/19      PT SHORT TERM GOAL #4   Title  The patient will demonstrate improved R heel strike demonstrating 50% occurrence of R heel strike at initial stance of gait.    Time  4    Period  Weeks    Status  Achieved    Target Date  11/21/19        PT Long Term Goals - 11/15/19 1414      PT LONG TERM GOAL #1   Title  The patient will be indep with progression of HEP for post d/c.    Time  8    Period  Weeks    Status  On-going      PT LONG TERM GOAL #2   Title  The patient will improve gait speed from 2.37 ft/sec up to 2.62 ft sec to demo transition to community ambulator classification of gait.    Baseline  2.77 ft/sec    Time  8    Period  Weeks    Status  Achieved      PT LONG TERM GOAL #3   Title  The patient will improve Berg score from 49/56 to > or equal to 53/56 to demo dec'd risk for falls.    Baseline  56/56    Time  8    Period  Weeks    Status  Achieved      PT LONG TERM GOAL #4   Title  The patient will demo floor<>stand transfer with UE support due to h/o falls.    Time  8    Period  Weeks    Status  Achieved            Plan - 11/18/19 1750    Clinical Impression Statement  The patient noted toe pain today and PT assessed-- it appears her 2nd toe nail is cutting into great toe leading to pain-- she plans to have sister assist with cutting her toenails.  Patient has dec'd  ability to multi-task during gait with ball toss and cognitive task.  Continue progressing HEP, and consider referral to POP program for post d/c activities.    PT Treatment/Interventions  ADLs/Self Care Home Management;Gait training;Stair training;Functional mobility training;Therapeutic activities;Therapeutic exercise;Balance training;Neuromuscular re-education;Patient/family education;Passive range of motion    PT Next Visit Plan   check HEP progression reviewing PWR exercises.  Work on multi-tasking with gait and high level balance/dynamic gait.    PT Home Exercise Plan  Access Code: [email or web address]    Consulted and Agree with Plan of Care  Patient       Patient will benefit from skilled therapeutic intervention in order to improve the following deficits and impairments:  Abnormal gait, Difficulty walking, Decreased balance, Decreased strength, Postural dysfunction  Visit Diagnosis: Unsteadiness on feet  Other abnormalities of gait and mobility  History of falling  Muscle weakness (generalized)  Dizziness and giddiness     Problem List Patient Active Problem List   Diagnosis Date Noted  . Primary Parkinsonism ([place]) 10/26/2019  . Osteoporosis 09/15/2019  . Blister of great toe of left foot 08/19/2019  . 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 Moderate episode of recurrent major depressive disorder ([place]) 02/09/2019  . 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 Shuffling gait 02/09/2019  . Balance problems 02/09/2019  . 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  . Orthostatic hypotension 02/01/2019  . Dizziness 02/01/2019  . Primary osteoarthritis of right foot 09/25/2018  . Snoring 09/25/2018  . Night terrors, adult 09/25/2018  . Multiple falls 09/25/2018  . Metatarsalgia of left foot 08/20/2018  . Chronic foot pain, right 08/20/2018  . Left hip pain 04/10/2017  . History of shingles 04/08/2017  . Adenomatous polyp 04/08/2017  . Pure hypercholesterolemia 04/08/2017  . History of kidney stones 04/08/2017  . Essential hypertension 04/08/2017  . Depression, recurrent ([place]) 04/08/2017  . Anxiety 04/08/2017  . Greater trochanteric bursitis of left hip 04/08/2017    Faduma Cho, PT 11/18/2019, 5:53 PM  [facility] [place] [address], [place], [address] Phone: [phone]   Fax:  [phone]  Name: Chelsey Estes MRN: [id number] Date  of Birth: 16-Nov-1941

## 2019-11-22 NOTE — Telephone Encounter (Signed)
Patient called back and left message stating she had never heard back about this injection. Do we know if she can start? Did we ever hear back?   She also wanted to get yearly lab work. Last labs in 09/2018. Pended labs, Jade please review and add additional if needed.

## 2019-11-22 NOTE — Addendum Note (Signed)
Addended byAnnamaria Helling on: 11/22/2019 03:12 PM   Modules accepted: Orders

## 2019-11-22 NOTE — Telephone Encounter (Signed)
What does unable to verify benefits mean?

## 2019-11-22 NOTE — Addendum Note (Signed)
Addended by: Donella Stade on: 11/22/2019 04:58 PM   Modules accepted: Orders

## 2019-11-23 ENCOUNTER — Encounter: Payer: Self-pay | Admitting: Rehabilitative and Restorative Service Providers"

## 2019-11-23 ENCOUNTER — Other Ambulatory Visit: Payer: Self-pay

## 2019-11-23 ENCOUNTER — Ambulatory Visit (INDEPENDENT_AMBULATORY_CARE_PROVIDER_SITE_OTHER): Payer: Medicare Other | Admitting: Rehabilitative and Restorative Service Providers"

## 2019-11-23 DIAGNOSIS — R2681 Unsteadiness on feet: Secondary | ICD-10-CM | POA: Diagnosis not present

## 2019-11-23 DIAGNOSIS — R42 Dizziness and giddiness: Secondary | ICD-10-CM | POA: Diagnosis not present

## 2019-11-23 DIAGNOSIS — Z9181 History of falling: Secondary | ICD-10-CM

## 2019-11-23 DIAGNOSIS — R2689 Other abnormalities of gait and mobility: Secondary | ICD-10-CM

## 2019-11-23 DIAGNOSIS — M6281 Muscle weakness (generalized): Secondary | ICD-10-CM

## 2019-11-23 NOTE — Therapy (Signed)
[place], [place], [address] Phone: [phone]   Fax:  [phone]  Physical Therapy Treatment and Discharge Summary  Patient Details  Name: Chelsey Estes MRN: [id number] Date of Birth: 01-May-1941 Referring Provider (PT): Tat, Eustace Quail, DO   Encounter Date: 11/23/2019  PT End of Session - 11/23/19 1125    Visit Number  9    Number of Visits  16    Date for PT Re-Evaluation  12/21/19    Authorization Type  medicare/BCBS    PT Start Time  1101    PT Stop Time  1130    PT Time Calculation (min)  29 min    Activity Tolerance  Patient tolerated treatment well    Behavior During Therapy  [facility] for tasks assessed/performed       Past Medical History:  Diagnosis Date  . Anxiety   . Depression   . Glaucoma   . Hyperlipidemia   . Hypertension   . Kidney stone     Past Surgical History:  Procedure Laterality Date  . ABDOMINAL HYSTERECTOMY    . ANTERIOR (CYSTOCELE) AND POSTERIOR REPAIR (RECTOCELE) WITH XENFORM GRAFT AND SACROSPINOUS FIXATION    . CATARACT EXTRACTION, BILATERAL    . INGUINAL HERNIA REPAIR Left     There were no vitals filed for this visit.  Subjective Assessment - 11/23/19 1106    Subjective  The patient is doing HEP.  My husband reports "I don't know why you are still going because your walking is better."  She reports the exercises and medications are helping.  The patient reports her left shoulder has significantly reduced pain.    Pertinent History  HTN (takes meds at lunch time due to lower blood pressure in the morning), depression, recently dx with Parkinsonism.    Patient Stated Goals  "Strengthen my core and my legs."    Currently in Pain?  No/denies                       [facility] Adult PT Treatment/Exercise - 11/23/19 1134      Ambulation/Gait   Ambulation/Gait  Yes    Ambulation/Gait Assistance  6: Modified independent (Device/Increase time)    Ambulation/Gait Assistance Details  Gait with cues for longer stride length, larger amplitude arm swing walking in clinic x 300 ft and t/o [facility] x 400 ft.  Also performed dynamic gait activities performing forward/backwards walking, fast paced gait, high marches without UEs and then with UEs with cues on sequencing.    Assistive device  None      Self-Care   Self-Care  Other Self-Care Comments    Other Self-Care Comments   Discussed continuation of HEP, availability of PWR classes for spring 2021.  Patient would like to participate.      Exercises   Exercises  Knee/Hip      Knee/Hip Exercises: Stretches   Active Hamstring Stretch  Right;Left;1 rep;30 seconds        PWR [facility]) - 11/23/19 1136    PWR! Up  10 reps *reviewed from prior HEP    PWR! Rock  10 reps with reaching *reviewed from [employer] Step  10 reps *reviewed from HEP          PT Education - 11/23/19 1138    Education Details  post d/c continuation of exercises    Person(s) Educated  Patient    Methods  Explanation;Demonstration    Comprehension  Returned demonstration;Verbalized understanding       PT Short Term Goals - 11/08/19 1353      PT SHORT TERM GOAL #1   Title  The patient will return demo HEP for balance, hip strength and general mobility.    Time  4    Period  Weeks    Status  Achieved    Target Date  11/21/19      PT SHORT TERM GOAL #2   Title  The patient will improve FGA from 13/30 to > or equal to 18/30 to demonstrate improved dynamic gait and reduced risk for falls.    Baseline  20/30 on FGA    Time  4    Period  Weeks    Status  Achieved    Target Date  11/21/19      PT SHORT TERM GOAL #3   Title  The patient will demonstrate a 360 degree turn in < or equal to 4 seconds.    Time  4    Period  Weeks    Status  Achieved    Target Date  11/21/19      PT SHORT TERM GOAL #4   Title  The patient will demonstrate improved R heel strike demonstrating 50% occurrence of R heel strike at  initial stance of gait.    Time  4    Period  Weeks    Status  Achieved    Target Date  11/21/19        PT Long Term Goals - 11/23/19 1138      PT LONG TERM GOAL #1   Title  The patient will be indep with progression of HEP for post d/c.    Time  8    Period  Weeks    Status  Achieved      PT LONG TERM GOAL #2   Title  The patient will improve gait speed from 2.37 ft/sec up to 2.62 ft sec to demo transition to community ambulator classification of gait.    Baseline  2.77 ft/sec    Time  8    Period  Weeks    Status  Achieved      PT LONG TERM GOAL #3   Title  The patient will improve Berg score from 49/56 to > or equal to 53/56 to demo dec'd risk for falls.    Baseline  56/56    Time  8    Period  Weeks    Status  Achieved      PT LONG TERM GOAL #4   Title  The patient will demo floor<>stand transfer with UE support due to h/o falls.    Time  8    Period  Weeks    Status  Achieved            Plan - 11/23/19 1109    Clinical Impression Statement  The patient reports that she can get into/out of bed easier, she is able to walk on unlevel ground without difficulty.  She still notices R foot catching with gait activities. It is intermittent in nature and PT notes this occurs more while multi-tasking.  The patient has met all STGs/LTGs and feels independent with progression of HEP.    PT Treatment/Interventions  ADLs/Self Care Home Management;Gait training;Stair training;Functional mobility training;Therapeutic activities;Therapeutic exercise;Balance training;Neuromuscular re-education;Patient/family education;Passive range of motion    PT Next Visit Plan  Discharge today.    PT Home Exercise Plan  Access Code: [email or web address]  Consulted and Agree with Plan of Care  Patient       Patient will benefit from skilled therapeutic intervention in order to improve the following deficits and impairments:  Abnormal gait, Difficulty walking, Decreased balance, Decreased strength,  Postural dysfunction  Visit Diagnosis: Unsteadiness on feet  Other abnormalities of gait and mobility  History of falling  Muscle weakness (generalized)  Dizziness and giddiness  PHYSICAL THERAPY DISCHARGE SUMMARY  Visits from Start of Care: 9  Current functional level related to goals / functional outcomes: See above   Remaining deficits: R foot dec'd clearance during gait worse during multi-tasking.   Education / Equipment: HEP progression, community exercise options (for possibly spring 2021 due to covid)  Plan: Patient agrees to discharge.  Patient goals were met. Patient is being discharged due to meeting the stated rehab goals.  ?????     Thank you for the referral of this patient. Rudell Cobb, MPT    Problem List Patient Active Problem List   Diagnosis Date Noted  . Primary Parkinsonism ([place]) 10/26/2019  . Osteoporosis 09/15/2019  . Blister of great toe of left foot 08/19/2019  . 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 Moderate episode of recurrent major depressive disorder ([place]) 02/09/2019  . 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 Shuffling gait 02/09/2019  . Balance problems 02/09/2019  . 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  . Orthostatic hypotension 02/01/2019  . Dizziness 02/01/2019  . Primary osteoarthritis of right foot 09/25/2018  . Snoring 09/25/2018  . Night terrors, adult 09/25/2018  . Multiple falls 09/25/2018  . Metatarsalgia of left foot 08/20/2018  . Chronic foot pain, right 08/20/2018  . Left hip pain 04/10/2017  . History of shingles 04/08/2017  . Adenomatous polyp 04/08/2017  . Pure hypercholesterolemia 04/08/2017  . History of kidney stones 04/08/2017  . Essential hypertension 04/08/2017  . Depression, recurrent ([place]) 04/08/2017  . Anxiety 04/08/2017  . Greater trochanteric bursitis of left hip 04/08/2017    WEAVER,CHRISTINA , PT 11/23/2019, 11:39 AM  [facility] [place], [place], [address] Phone: [phone]   Fax:  [phone]  Name: Chelsey Estes MRN: [id number] Date of Birth: 1941-03-01

## 2019-11-23 NOTE — Telephone Encounter (Signed)
I called the patient and she is aware that her insurance approved the Prolia shot. She is going to get labs and then call to make a nurse visit for her Prolia. No other questions at this time.   Prolia approved from 10/24/2019 through 11/22/2020.

## 2019-11-23 NOTE — Telephone Encounter (Signed)
I have re-sent benefits to insurance and waiting on a response.

## 2019-11-24 ENCOUNTER — Ambulatory Visit (INDEPENDENT_AMBULATORY_CARE_PROVIDER_SITE_OTHER): Payer: Medicare Other | Admitting: Psychology

## 2019-11-24 DIAGNOSIS — F339 Major depressive disorder, recurrent, unspecified: Secondary | ICD-10-CM | POA: Diagnosis not present

## 2019-11-26 DIAGNOSIS — H401132 Primary open-angle glaucoma, bilateral, moderate stage: Secondary | ICD-10-CM | POA: Diagnosis not present

## 2019-11-26 DIAGNOSIS — H524 Presbyopia: Secondary | ICD-10-CM | POA: Diagnosis not present

## 2019-11-30 ENCOUNTER — Encounter: Payer: Medicare Other | Admitting: Rehabilitative and Restorative Service Providers"

## 2019-12-02 DIAGNOSIS — E7800 Pure hypercholesterolemia, unspecified: Secondary | ICD-10-CM | POA: Diagnosis not present

## 2019-12-03 LAB — COMPLETE METABOLIC PANEL WITH GFR
AG Ratio: 1.6 (calc) (ref 1.0–2.5)
ALT: 3 U/L — ABNORMAL LOW (ref 6–29)
AST: 11 U/L (ref 10–35)
Albumin: 4.1 g/dL (ref 3.6–5.1)
Alkaline phosphatase (APISO): 69 U/L (ref 37–153)
BUN: 16 mg/dL (ref 7–25)
CO2: 25 mmol/L (ref 20–32)
Calcium: 9.9 mg/dL (ref 8.6–10.4)
Chloride: 105 mmol/L (ref 98–110)
Creat: 0.9 mg/dL (ref 0.60–0.93)
GFR, Est African American: 71 mL/min/{1.73_m2} (ref >=60)
GFR, Est Non African American: 61 mL/min/{1.73_m2} (ref >=60)
Globulin: 2.5 g/dL (calc) (ref 1.9–3.7)
Glucose, Bld: 96 mg/dL (ref 65–99)
Potassium: 4.1 mmol/L (ref 3.5–5.3)
Sodium: 141 mmol/L (ref 135–146)
Total Bilirubin: 0.4 mg/dL (ref 0.2–1.2)
Total Protein: 6.6 g/dL (ref 6.1–8.1)

## 2019-12-03 LAB — LIPID PANEL
Cholesterol: 172 mg/dL (ref <200)
HDL: 50 mg/dL (ref >=50)
LDL Cholesterol (Calc): 99 mg/dL (calc)
Non-HDL Cholesterol (Calc): 122 mg/dL (calc) (ref <130)
Total CHOL/HDL Ratio: 3.4 (calc) (ref <5.0)
Triglycerides: 136 mg/dL (ref <150)

## 2019-12-03 LAB — CBC
HCT: 44.8 % (ref 35.0–45.0)
Hemoglobin: 15 g/dL (ref 11.7–15.5)
MCH: 29.8 pg (ref 27.0–33.0)
MCHC: 33.5 g/dL (ref 32.0–36.0)
MCV: 89.1 fL (ref 80.0–100.0)
MPV: 11.5 fL (ref 7.5–12.5)
Platelets: 305 10*3/uL (ref 140–400)
RBC: 5.03 10*6/uL (ref 3.80–5.10)
RDW: 12.8 % (ref 11.0–15.0)
WBC: 8.8 10*3/uL (ref 3.8–10.8)

## 2019-12-03 LAB — TSH: TSH: 1.23 mIU/L (ref 0.40–4.50)

## 2019-12-03 MED ORDER — CYCLOBENZAPRINE HCL 5 MG PO TABS: 5.0000 mg | ORAL_TABLET | Freq: Three times a day (TID) | ORAL | 0 refills | Status: DC

## 2019-12-03 NOTE — Telephone Encounter (Signed)
Call pt: Kidney, liver, glucose look great. Thyroid looks great. Cholesterol is great. WBC is great.

## 2019-12-03 NOTE — Telephone Encounter (Signed)
Scheduled Prolia Injection for 12/06/19 at 10:30

## 2019-12-03 NOTE — Addendum Note (Signed)
Addended byMargarette Asal L on: 12/03/2019 11:06 AM   Modules accepted: Orders

## 2019-12-03 NOTE — Telephone Encounter (Signed)
Patient called back and left vm to schedule nurse visit for Prolia shot. Can we get her scheduled?

## 2019-12-06 ENCOUNTER — Other Ambulatory Visit: Payer: Self-pay

## 2019-12-06 ENCOUNTER — Ambulatory Visit (INDEPENDENT_AMBULATORY_CARE_PROVIDER_SITE_OTHER): Payer: Medicare Other | Admitting: Osteopathic Medicine

## 2019-12-06 VITALS — BP 128/82 | HR 72 | Temp 98.4°F | Wt 148.0 lb

## 2019-12-06 DIAGNOSIS — M810 Age-related osteoporosis without current pathological fracture: Secondary | ICD-10-CM | POA: Diagnosis not present

## 2019-12-06 MED ORDER — DENOSUMAB 60 MG/ML ~~LOC~~ SOSY
60.0000 mg | PREFILLED_SYRINGE | Freq: Once | SUBCUTANEOUS | Status: AC
  Administered 2019-12-06: 60 mg via SUBCUTANEOUS

## 2019-12-06 NOTE — Progress Notes (Signed)
Pt in office today for prolia injection. Injection given subQ in left arm., pt tolerated without complications.

## 2019-12-08 ENCOUNTER — Ambulatory Visit (INDEPENDENT_AMBULATORY_CARE_PROVIDER_SITE_OTHER): Payer: Medicare Other | Admitting: Psychology

## 2019-12-08 DIAGNOSIS — F339 Major depressive disorder, recurrent, unspecified: Secondary | ICD-10-CM | POA: Diagnosis not present

## 2019-12-17 ENCOUNTER — Other Ambulatory Visit: Payer: Self-pay | Admitting: Physician Assistant

## 2019-12-17 DIAGNOSIS — E782 Mixed hyperlipidemia: Secondary | ICD-10-CM

## 2019-12-17 DIAGNOSIS — I10 Essential (primary) hypertension: Secondary | ICD-10-CM

## 2019-12-22 ENCOUNTER — Ambulatory Visit (INDEPENDENT_AMBULATORY_CARE_PROVIDER_SITE_OTHER): Payer: Medicare Other | Admitting: Psychology

## 2019-12-22 DIAGNOSIS — F339 Major depressive disorder, recurrent, unspecified: Secondary | ICD-10-CM | POA: Diagnosis not present

## 2020-01-05 ENCOUNTER — Ambulatory Visit (INDEPENDENT_AMBULATORY_CARE_PROVIDER_SITE_OTHER): Payer: Medicare Other | Admitting: Psychology

## 2020-01-05 DIAGNOSIS — F339 Major depressive disorder, recurrent, unspecified: Secondary | ICD-10-CM

## 2020-01-09 ENCOUNTER — Ambulatory Visit: Payer: Medicare Other | Attending: Internal Medicine

## 2020-01-09 DIAGNOSIS — Z23 Encounter for immunization: Secondary | ICD-10-CM

## 2020-01-09 NOTE — Progress Notes (Signed)
   Covid-19 Vaccination Clinic  Name:  Chelsey Estes    MRN: [id number] DOB: 1941-06-02  01/09/2020  Chelsey Estes was observed post Covid-19 immunization for 15 minutes without incidence. She was provided with Vaccine Information Sheet and instruction to access the V-Safe system.   Chelsey Estes was instructed to call 911 with any severe reactions post vaccine: Marland Kitchen Difficulty breathing  . Swelling of your face and throat  . A fast heartbeat  . A bad rash all over your body  . Dizziness and weakness    Immunizations Administered    Name Date Dose VIS Date Route   Pfizer COVID-19 Vaccine 01/09/2020 10:58 AM 0.3 mL 11/26/2019 Intramuscular   Manufacturer: [place]   Lot: [id number]   [place]: [id number]

## 2020-01-19 ENCOUNTER — Ambulatory Visit (INDEPENDENT_AMBULATORY_CARE_PROVIDER_SITE_OTHER): Payer: Medicare Other | Admitting: Psychology

## 2020-01-19 DIAGNOSIS — F339 Major depressive disorder, recurrent, unspecified: Secondary | ICD-10-CM | POA: Diagnosis not present

## 2020-01-31 ENCOUNTER — Telehealth: Payer: Self-pay | Admitting: Physician Assistant

## 2020-01-31 ENCOUNTER — Ambulatory Visit: Payer: Medicare Other | Attending: Internal Medicine

## 2020-01-31 DIAGNOSIS — Z23 Encounter for immunization: Secondary | ICD-10-CM | POA: Insufficient documentation

## 2020-01-31 NOTE — Telephone Encounter (Signed)
I sent an email to [place] with [facility] to have an additional diagnosis code added for the Lipid and TSH panel drawn on 12/02/19.  Additional diagnosis code added was E78.00.

## 2020-01-31 NOTE — Progress Notes (Signed)
   Covid-19 Vaccination Clinic  Name:  Morena Gerardi    MRN: [id number] DOB: 03/14/1941  01/31/2020  Ms. Lykken was observed post Covid-19 immunization for 15 minutes without incidence. She was provided with Vaccine Information Sheet and instruction to access the V-Safe system.   Ms. Falkenhagen was instructed to call 911 with any severe reactions post vaccine: Marland Kitchen Difficulty breathing  . Swelling of your face and throat  . A fast heartbeat  . A bad rash all over your body  . Dizziness and weakness    Immunizations Administered    Name Date Dose VIS Date Route   Pfizer COVID-19 Vaccine 01/31/2020  9:37 AM 0.3 mL 11/26/2019 Intramuscular   Manufacturer: [place]   Lot: [id number]   [place]: [id number]

## 2020-02-02 ENCOUNTER — Ambulatory Visit (INDEPENDENT_AMBULATORY_CARE_PROVIDER_SITE_OTHER): Payer: Medicare Other | Admitting: Psychology

## 2020-02-02 DIAGNOSIS — F339 Major depressive disorder, recurrent, unspecified: Secondary | ICD-10-CM | POA: Diagnosis not present

## 2020-02-16 ENCOUNTER — Ambulatory Visit (INDEPENDENT_AMBULATORY_CARE_PROVIDER_SITE_OTHER): Payer: Medicare Other | Admitting: Psychology

## 2020-02-16 DIAGNOSIS — F339 Major depressive disorder, recurrent, unspecified: Secondary | ICD-10-CM

## 2020-03-01 ENCOUNTER — Ambulatory Visit (INDEPENDENT_AMBULATORY_CARE_PROVIDER_SITE_OTHER): Payer: Medicare Other | Admitting: Psychology

## 2020-03-01 DIAGNOSIS — F339 Major depressive disorder, recurrent, unspecified: Secondary | ICD-10-CM

## 2020-03-03 ENCOUNTER — Ambulatory Visit: Payer: Medicare Other | Admitting: Neurology

## 2020-03-08 ENCOUNTER — Telehealth: Payer: Self-pay | Admitting: Neurology

## 2020-03-08 NOTE — Telephone Encounter (Signed)
Patient sent paperwork stating bill not covered for bone density and lipid panel. She asked for assistance. I tried to call patient to let her know she would have to talk to billing, per clinical lead. No answer. No vm.

## 2020-03-15 ENCOUNTER — Ambulatory Visit: Payer: Medicare Other | Admitting: Psychology

## 2020-03-29 ENCOUNTER — Ambulatory Visit (INDEPENDENT_AMBULATORY_CARE_PROVIDER_SITE_OTHER): Payer: Medicare Other | Admitting: Psychology

## 2020-03-29 DIAGNOSIS — F339 Major depressive disorder, recurrent, unspecified: Secondary | ICD-10-CM

## 2020-03-31 ENCOUNTER — Other Ambulatory Visit: Payer: Self-pay | Admitting: Physician Assistant

## 2020-03-31 DIAGNOSIS — F419 Anxiety disorder, unspecified: Secondary | ICD-10-CM

## 2020-03-31 DIAGNOSIS — F339 Major depressive disorder, recurrent, unspecified: Secondary | ICD-10-CM

## 2020-04-12 ENCOUNTER — Encounter: Payer: Self-pay | Admitting: Physician Assistant

## 2020-04-12 ENCOUNTER — Ambulatory Visit (INDEPENDENT_AMBULATORY_CARE_PROVIDER_SITE_OTHER): Payer: Medicare Other | Admitting: Psychology

## 2020-04-12 DIAGNOSIS — F339 Major depressive disorder, recurrent, unspecified: Secondary | ICD-10-CM

## 2020-04-13 ENCOUNTER — Other Ambulatory Visit: Payer: Self-pay

## 2020-04-13 MED ORDER — CARBIDOPA-LEVODOPA 25-100 MG PO TABS: 1.0000 | ORAL_TABLET | Freq: Three times a day (TID) | ORAL | 1 refills | Status: DC

## 2020-04-13 NOTE — Telephone Encounter (Signed)
Rx(s) sent to pharmacy electronically.  

## 2020-04-26 ENCOUNTER — Ambulatory Visit (INDEPENDENT_AMBULATORY_CARE_PROVIDER_SITE_OTHER): Payer: Medicare Other | Admitting: Psychology

## 2020-04-26 DIAGNOSIS — F339 Major depressive disorder, recurrent, unspecified: Secondary | ICD-10-CM

## 2020-05-04 NOTE — Progress Notes (Signed)
Assessment/Plan:   1.  Parkinsons Disease  -Likely PD, but given diplopia early on, may have a atypical state.  Difficult to tell given the fact that patient has had multiple eye surgeries.  -Continue carbidopa/levodopa 25/100, 1 tablet 3 times per day.  2.  Diplopia  -seeing ophthalmology.  Long hx of eye surgeries  3.  Anxiety/depression  -In counseling.  Husband is alcoholic. Subjective:   Chelsey Estes was seen today in follow up for parkinsonism.  My previous records were reviewed prior to todays visit as well as outside records available to me.  Levodopa was started last visit.  She reports that "I'm doing very well." no longer has to use any ambulatory device outside and no longer with fear of falling.    pt did do physical therapy after our last visit.  Those notes are reviewed.  Pt denies lightheadedness, near syncope.  No hallucinations.  Mood has been good.  In regards to her vision, she was referred to [facility] ophthalmology.  She reports that she saw them one time.  I didn't get those records.   Current prescribed movement disorder medications: Carbidopa/levodopa 25/100, 1 tablet 3 times daily   PREVIOUS MEDICATIONS: Sinemet  ALLERGIES:  No Known Allergies  CURRENT MEDICATIONS:  Outpatient Encounter Medications as of 05/05/2020  Medication Sig  . ALPHAGAN P 0.1 % SOLN Place 1 drop into both eyes in the morning and at bedtime.   Marland Kitchen buPROPion (WELLBUTRIN XL) 300 MG 24 hr tablet TAKE 1 TABLET(300 MG) BY MOUTH DAILY  . carbidopa-levodopa (SINEMET IR) 25-100 MG tablet Take 1 tablet by mouth 3 (three) times daily.  . cyanocobalamin 1000 MCG tablet Take 2,000 mcg by mouth daily.  . cyclobenzaprine (FLEXERIL) 5 MG tablet Take 1 tablet (5 mg total) by mouth 3 (three) times daily. APPT FOR FURTHER REFILLS  . diclofenac sodium (VOLTAREN) 1 % GEL Apply 4 g topically 4 (four) times daily. To affected joint.  Marland Kitchen lisinopril (ZESTRIL) 5 MG tablet TAKE 1 TABLET(5 MG) BY MOUTH TWICE  DAILY  . Melatonin 5 MG CHEW Chew 2 tablets by mouth at bedtime.  . meloxicam (MOBIC) 15 MG tablet TAKE 1 TABLET(15 MG) BY MOUTH DAILY  . simvastatin (ZOCOR) 10 MG tablet TAKE 1 TABLET(10 MG) BY MOUTH DAILY  . solifenacin (VESICARE) 5 MG tablet Take 5 mg by mouth daily. occassional  . timolol (TIMOPTIC) 0.5 % ophthalmic solution Place 1 drop into both eyes daily.   . [DISCONTINUED] AMBULATORY NON FORMULARY MEDICATION Flex it tens unit as needed for low back pain (Patient not taking: Reported on 12/06/2019)   No facility-administered encounter medications on file as of 05/05/2020.    Objective:   PHYSICAL EXAMINATION:    VITALS:   Vitals:   05/05/20 1422  BP: 108/70  Pulse: 64  SpO2: 96%  Weight: 152 lb (68.9 kg)  Height: 5\' 1"  (1.549 m)    GEN:  The patient appears stated age and is in NAD. HEENT:  Normocephalic, atraumatic.  The mucous membranes are moist. The superficial temporal arteries are without ropiness or tenderness. CV:  RRR Lungs:  CTAB Neck/HEME:  There are no carotid bruits bilaterally.  Neurological examination:  Orientation: The patient is alert and oriented x3. Cranial nerves: There is good facial symmetry with mild facial hypomimia. The speech is fluent and clear. Soft palate rises symmetrically and there is no tongue deviation. Hearing is intact to conversational tone. Sensation: Sensation is intact to light touch throughout Motor: Strength is at  least antigravity x4.  Movement examination: Tone: There is normal tone in the UE/LE Abnormal movements: none Coordination:  There is no decremation with RAM's, with any form of RAMS, including alternating supination and pronation of the forearm, hand opening and closing, finger taps, heel taps and toe taps. Gait and Station: The patient has no difficulty arising out of a deep-seated chair without the use of the hands. The patient's stride length is good.      I have reviewed and interpreted the following labs  independently    Chemistry      Component Value Date/Time   NA 141 12/02/2019 0846   NA 142 09/25/2016 0000   K 4.1 12/02/2019 0846   CL 105 12/02/2019 0846   CO2 25 12/02/2019 0846   BUN 16 12/02/2019 0846   BUN 15 09/25/2016 0000   CREATININE 0.90 12/02/2019 0846   GLU 109 09/25/2016 0000      Component Value Date/Time   CALCIUM 9.9 12/02/2019 0846   ALKPHOS 68 09/25/2016 0000   AST 11 12/02/2019 0846   ALT 3 (L) 12/02/2019 0846   BILITOT 0.4 12/02/2019 0846       Lab Results  Component Value Date   WBC 8.8 12/02/2019   HGB 15.0 12/02/2019   HCT 44.8 12/02/2019   MCV 89.1 12/02/2019   PLT 305 12/02/2019    Lab Results  Component Value Date   TSH 1.23 12/02/2019     Total time spent on today's visit was 30 minutes, including both face-to-face time and nonface-to-face time.  Time included that spent on review of records (prior notes available to me/labs/imaging if pertinent), discussing treatment and goals, answering patient's questions and coordinating care.  Cc:  Donella Stade, PA-C

## 2020-05-05 ENCOUNTER — Ambulatory Visit (INDEPENDENT_AMBULATORY_CARE_PROVIDER_SITE_OTHER): Payer: Federal, State, Local not specified - PPO | Admitting: Neurology

## 2020-05-05 ENCOUNTER — Other Ambulatory Visit: Payer: Self-pay

## 2020-05-05 ENCOUNTER — Encounter: Payer: Self-pay | Admitting: Neurology

## 2020-05-05 VITALS — BP 108/70 | HR 64 | Ht 61.0 in | Wt 152.0 lb

## 2020-05-05 DIAGNOSIS — G20 Parkinson's disease: Secondary | ICD-10-CM | POA: Diagnosis not present

## 2020-05-05 NOTE — Patient Instructions (Signed)
The physicians and staff at [facility] are committed to providing excellent care. You may receive a survey requesting feedback about your experience at our office. We strive to receive "very good" responses to the survey questions. If you feel that your experience would prevent you from giving the office a "very good " response, please contact our office to try to remedy the situation. We may be reached at [phone]. Thank you for taking the time out of your busy day to complete the survey.  

## 2020-05-10 ENCOUNTER — Ambulatory Visit: Payer: Medicare Other | Admitting: Psychology

## 2020-05-12 ENCOUNTER — Ambulatory Visit (INDEPENDENT_AMBULATORY_CARE_PROVIDER_SITE_OTHER): Payer: Medicare Other | Admitting: Psychology

## 2020-05-12 DIAGNOSIS — F339 Major depressive disorder, recurrent, unspecified: Secondary | ICD-10-CM | POA: Diagnosis not present

## 2020-05-24 ENCOUNTER — Ambulatory Visit (INDEPENDENT_AMBULATORY_CARE_PROVIDER_SITE_OTHER): Payer: Medicare Other | Admitting: Psychology

## 2020-05-24 DIAGNOSIS — F339 Major depressive disorder, recurrent, unspecified: Secondary | ICD-10-CM

## 2020-06-02 ENCOUNTER — Ambulatory Visit: Payer: Medicare Other | Admitting: Neurology

## 2020-06-05 ENCOUNTER — Ambulatory Visit (INDEPENDENT_AMBULATORY_CARE_PROVIDER_SITE_OTHER): Payer: Medicare Other | Admitting: Physician Assistant

## 2020-06-05 ENCOUNTER — Encounter: Payer: Self-pay | Admitting: Physician Assistant

## 2020-06-05 VITALS — BP 128/68 | HR 70 | Wt 151.0 lb

## 2020-06-05 DIAGNOSIS — F5101 Primary insomnia: Secondary | ICD-10-CM

## 2020-06-05 DIAGNOSIS — Z79899 Other long term (current) drug therapy: Secondary | ICD-10-CM

## 2020-06-05 DIAGNOSIS — F419 Anxiety disorder, unspecified: Secondary | ICD-10-CM

## 2020-06-05 DIAGNOSIS — Z87442 Personal history of urinary calculi: Secondary | ICD-10-CM | POA: Diagnosis not present

## 2020-06-05 DIAGNOSIS — F331 Major depressive disorder, recurrent, moderate: Secondary | ICD-10-CM

## 2020-06-05 DIAGNOSIS — M545 Low back pain, unspecified: Secondary | ICD-10-CM

## 2020-06-05 DIAGNOSIS — F339 Major depressive disorder, recurrent, unspecified: Secondary | ICD-10-CM

## 2020-06-05 DIAGNOSIS — R300 Dysuria: Secondary | ICD-10-CM | POA: Diagnosis not present

## 2020-06-05 DIAGNOSIS — E782 Mixed hyperlipidemia: Secondary | ICD-10-CM

## 2020-06-05 DIAGNOSIS — Z23 Encounter for immunization: Secondary | ICD-10-CM | POA: Diagnosis not present

## 2020-06-05 DIAGNOSIS — G20 Parkinson's disease: Secondary | ICD-10-CM

## 2020-06-05 DIAGNOSIS — I10 Essential (primary) hypertension: Secondary | ICD-10-CM

## 2020-06-05 LAB — POCT URINALYSIS DIP (CLINITEK)
Bilirubin, UA: NEGATIVE
Blood, UA: NEGATIVE
Glucose, UA: NEGATIVE mg/dL
Nitrite, UA: NEGATIVE
POC PROTEIN,UA: NEGATIVE
Spec Grav, UA: 1.025 (ref 1.010–1.025)
Urobilinogen, UA: 1 E.U./dL
pH, UA: 5.5 (ref 5.0–8.0)

## 2020-06-05 MED ORDER — MELOXICAM 15 MG PO TABS: ORAL_TABLET | ORAL | 0 refills | Status: DC

## 2020-06-05 MED ORDER — CYCLOBENZAPRINE HCL 5 MG PO TABS: 5.0000 mg | ORAL_TABLET | Freq: Three times a day (TID) | ORAL | 2 refills | Status: DC

## 2020-06-05 MED ORDER — LISINOPRIL 5 MG PO TABS: ORAL_TABLET | ORAL | 1 refills | Status: DC

## 2020-06-05 MED ORDER — SIMVASTATIN 10 MG PO TABS: ORAL_TABLET | ORAL | 3 refills | Status: DC

## 2020-06-05 MED ORDER — BUPROPION HCL ER (XL) 300 MG PO TB24: ORAL_TABLET | ORAL | 3 refills | Status: DC

## 2020-06-05 MED ORDER — TAMSULOSIN HCL 0.4 MG PO CAPS: 0.4000 mg | ORAL_CAPSULE | Freq: Every day | ORAL | 0 refills | Status: DC

## 2020-06-05 MED ORDER — TRAZODONE HCL 50 MG PO TABS: 25.0000 mg | ORAL_TABLET | Freq: Every evening | ORAL | 2 refills | Status: DC | PRN

## 2020-06-05 NOTE — Patient Instructions (Addendum)
Start trazodone 1/2 to 1 tablet 1 hour before bedtime.  Return urine culture to lab.  You can contact [facility] billing department at [phone] or [phone].

## 2020-06-05 NOTE — Progress Notes (Signed)
Subjective:    Patient ID: Chelsey Estes, female    DOB: Jan 12, 1941, 79 y.o.   MRN: [id number]  HPI  Patient is a 79 year old female with Parkinson's disease, osteoporosis, major depression, hypertension, hypercholesteremia, insomnia, history of kidney stones who presents to the clinic for medication follow-up.  She recently passed a kidney stone(5/29) and had to go to the hospital due to pain.  She then felt like she passed another kidney stone this weekend.  The pain has resolved but she still having some frequent urination and discomfort.  She wants to confirm she does not also have an infection.she has a urologist. She finished abx beginning of June.   She is otherwise doing well. She does get easily frustrated with insurance companies and billing. She regularly talks to [place] her counselor.   She follows up regularly with neurology for parkinson treatment.   Patient is having more more trouble sleeping.  She has trouble going to sleep and staying asleep.  Her daughter is on trazodone and wonders if she could try that.  She has tried some melatonin and some over-the-counter unisom and does not seem to work well.   .. Active Ambulatory Problems    Diagnosis Date Noted  . History of shingles 04/08/2017  . Adenomatous polyp 04/08/2017  . Pure hypercholesterolemia 04/08/2017  . History of kidney stones 04/08/2017  . Essential hypertension 04/08/2017  . Depression, recurrent ([place]) 04/08/2017  . Anxiety 04/08/2017  . Greater trochanteric bursitis of left hip 04/08/2017  . Left hip pain 04/10/2017  . Metatarsalgia of left foot 08/20/2018  . Chronic foot pain, right 08/20/2018  . Primary osteoarthritis of right foot 09/25/2018  . Snoring 09/25/2018  . Night terrors, adult 09/25/2018  . Multiple falls 09/25/2018  . Orthostatic hypotension 02/01/2019  . Dizziness 02/01/2019  . Moderate episode of recurrent major depressive disorder ([place]) 02/09/2019  . 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 Shuffling gait 02/09/2019  . Balance problems 02/09/2019  . 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  . 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 Blister of great toe of left foot 08/19/2019  . Osteoporosis 09/15/2019  . Primary Parkinsonism ([place]) 10/26/2019  . Mixed hyperlipidemia 06/06/2020  . Primary insomnia 06/06/2020   Resolved Ambulatory Problems    Diagnosis Date Noted  . No Resolved Ambulatory Problems   Past Medical History:  Diagnosis Date  . Depression   . Glaucoma   . Hyperlipidemia   . Hypertension   . Kidney stone       Review of Systems See HPI.     Objective:   Physical Exam Vitals reviewed.  Constitutional:      Appearance: Normal appearance.  Cardiovascular:     Rate and Rhythm: Normal rate and regular rhythm.     Pulses: Normal pulses.  Pulmonary:     Effort: Pulmonary effort is normal.     Breath sounds: Normal breath sounds.  Neurological:     General: No focal deficit present.     Mental Status: She is alert and oriented to person, place, and time.     Comments: Noted shuffled gait.   Psychiatric:        Mood and Affect: Mood normal.    .. Depression screen [facility] 2/9 05/05/2020 06/15/2019 03/15/2019 02/08/2019 01/25/2019  Decreased Interest 1 0 2 2 1   Down, Depressed, Hopeless 1 1 1 2 2   PHQ - 2 Score 2 1 3 4 3   Altered sleeping 3 - 1 2 1   Tired, decreased energy  1 - 1 2 1   Change in appetite 1 - 0 0 1  Feeling bad or failure about yourself  0 - 0 0 0  Trouble concentrating 1 - 0 0 0  Moving slowly or fidgety/restless 0 - 0 2 2  Suicidal thoughts 1 - 0 0 0  PHQ-9 Score 9 - 5 10 8   Difficult doing work/chores - - Not difficult at all Not difficult at all Somewhat difficult   .Marland Kitchen GAD 7 : Generalized Anxiety Score 03/15/2019 02/08/2019 01/25/2019 03/26/2018  Nervous, Anxious, on Edge 1 2 2 1   Control/stop worrying 2 2 2 1   Worry too much - different things 0 2 2 1   Trouble relaxing 0 0 2 0  Restless 0 0 2 0  Easily annoyed  or irritable 1 1 2 1   Afraid - awful might happen 0 1 2 0  Total GAD 7 Score 4 8 14 4   Anxiety Difficulty Not difficult at all Somewhat difficult Somewhat difficult Somewhat difficult           Assessment & Plan:  Marland KitchenMarland KitchenWinefred was seen today for medication discussion.  Diagnoses and all orders for this visit:  Dysuria -     Urine Culture -     POCT URINALYSIS DIP (CLINITEK)  Need for 23-polyvalent pneumococcal polysaccharide vaccine -     Pneumococcal polysaccharide vaccine 23-valent greater than or equal to 2yo subcutaneous/IM  Depression, recurrent (HCC) -     buPROPion (WELLBUTRIN XL) 300 MG 24 hr tablet; Take one tablet daily. -     COMPLETE METABOLIC PANEL WITH GFR  Anxiety -     buPROPion (WELLBUTRIN XL) 300 MG 24 hr tablet; Take one tablet daily.  Acute bilateral low back pain without sciatica -     cyclobenzaprine (FLEXERIL) 5 MG tablet; Take 1 tablet (5 mg total) by mouth 3 (three) times daily. -     meloxicam (MOBIC) 15 MG tablet; TAKE 1 TABLET(15 MG) BY MOUTH DAILY  Essential hypertension -     lisinopril (ZESTRIL) 5 MG tablet; TAKE 1 TABLET(5 MG) BY MOUTH TWICE DAILY -     COMPLETE METABOLIC PANEL WITH GFR  Mixed hyperlipidemia -     simvastatin (ZOCOR) 10 MG tablet; TAKE 1 TABLET(10 MG) BY MOUTH DAILY -     COMPLETE METABOLIC PANEL WITH GFR  Primary insomnia -     traZODone (DESYREL) 50 MG tablet; Take 0.5-1 tablets (25-50 mg total) by mouth at bedtime as needed for sleep.  Medication management -     COMPLETE METABOLIC PANEL WITH GFR  History of kidney stones -     tamsulosin (FLOMAX) 0.4 MG CAPS capsule; Take 1 capsule (0.4 mg total) by mouth daily after supper. For 15 days at onset of kidney stones symptoms. -     Urine Culture -     POCT URINALYSIS DIP (CLINITEK)  Primary Parkinsonism (HCC)  Moderate episode of recurrent major depressive disorder ([place])    Results for orders placed or performed in visit on 06/05/20  POCT URINALYSIS DIP (CLINITEK)   Result Value Ref Range   Color, UA yellow yellow   Clarity, UA clear clear   Glucose, UA negative negative mg/dL   Bilirubin, UA negative negative   Ketones, POC UA trace (5) (A) negative mg/dL   Spec Grav, UA 1.025 1.010 - 1.025   Blood, UA negative negative   pH, UA 5.5 5.0 - 8.0   POC PROTEIN,UA negative negative, trace   Urobilinogen, UA 1.0 0.2  or 1.0 E.U./dL   Nitrite, UA Negative Negative   Leukocytes, UA Trace (A) Negative   Not enough to culture. Does not appear to be infection. Did give pt cup to return for culture.   flomax to use as needed at the start of kidney stone pain. Use for 10-15 days at a time then stop. Continue to follow up with urology.   Discussed sleep and good sleep routine. Try trazodone. Discussed side effects. Follow up in 3 months.   Refilled wellbutrin. Continue counseling with Janett Billow.   CMP ordered. Needs next dose of prolia for osteoporosis. Some problems with payment on last dose.   Follow up in 3 months.

## 2020-06-06 ENCOUNTER — Encounter: Payer: Self-pay | Admitting: Physician Assistant

## 2020-06-06 DIAGNOSIS — E782 Mixed hyperlipidemia: Secondary | ICD-10-CM | POA: Insufficient documentation

## 2020-06-06 DIAGNOSIS — F5101 Primary insomnia: Secondary | ICD-10-CM | POA: Insufficient documentation

## 2020-06-07 ENCOUNTER — Ambulatory Visit (INDEPENDENT_AMBULATORY_CARE_PROVIDER_SITE_OTHER): Payer: Medicare Other | Admitting: Psychology

## 2020-06-07 ENCOUNTER — Telehealth: Payer: Self-pay | Admitting: Physician Assistant

## 2020-06-07 DIAGNOSIS — F339 Major depressive disorder, recurrent, unspecified: Secondary | ICD-10-CM

## 2020-06-07 NOTE — Telephone Encounter (Signed)
Pt is due for prolia but there was some problems with insurance paying last time. Can we work on this?

## 2020-06-08 LAB — COMPLETE METABOLIC PANEL WITH GFR
AG Ratio: 1.7 (calc) (ref 1.0–2.5)
ALT: 4 U/L — ABNORMAL LOW (ref 6–29)
AST: 12 U/L (ref 10–35)
Albumin: 4 g/dL (ref 3.6–5.1)
Alkaline phosphatase (APISO): 49 U/L (ref 37–153)
BUN/Creatinine Ratio: 21 (calc) (ref 6–22)
BUN: 20 mg/dL (ref 7–25)
CO2: 29 mmol/L (ref 20–32)
Calcium: 10.3 mg/dL (ref 8.6–10.4)
Chloride: 102 mmol/L (ref 98–110)
Creat: 0.96 mg/dL — ABNORMAL HIGH (ref 0.60–0.93)
GFR, Est African American: 65 mL/min/{1.73_m2} (ref >=60)
GFR, Est Non African American: 56 mL/min/{1.73_m2} — ABNORMAL LOW (ref >=60)
Globulin: 2.3 g/dL (calc) (ref 1.9–3.7)
Glucose, Bld: 97 mg/dL (ref 65–99)
Potassium: 4.8 mmol/L (ref 3.5–5.3)
Sodium: 136 mmol/L (ref 135–146)
Total Bilirubin: 0.4 mg/dL (ref 0.2–1.2)
Total Protein: 6.3 g/dL (ref 6.1–8.1)

## 2020-06-09 LAB — URINE CULTURE
MICRO NUMBER:: 10631451
SPECIMEN QUALITY:: ADEQUATE

## 2020-06-09 NOTE — Progress Notes (Signed)
Chelsey Estes,   Kidney function dropped just a hair from 6 months ago but likely due to passing of the most recent kidney stone. How are you feeling today?

## 2020-06-12 ENCOUNTER — Encounter: Payer: Self-pay | Admitting: Physician Assistant

## 2020-06-12 NOTE — Progress Notes (Signed)
Chelsey Estes,   Very small amount of gram positive bacteria. Would not usually treat with antibiotics. How are you feeling?

## 2020-06-13 NOTE — Telephone Encounter (Signed)
Please call to schedule patient for a nurse visit Prolia injection.  No PA required.

## 2020-06-14 NOTE — Telephone Encounter (Signed)
Appointment has been made. No further questions at this time.  

## 2020-06-20 ENCOUNTER — Ambulatory Visit: Payer: Medicare Other

## 2020-06-21 ENCOUNTER — Ambulatory Visit: Payer: Medicare Other | Admitting: Psychology

## 2020-07-05 ENCOUNTER — Ambulatory Visit: Payer: Medicare Other | Admitting: Psychology

## 2020-07-19 ENCOUNTER — Ambulatory Visit: Payer: Federal, State, Local not specified - PPO | Admitting: Psychology

## 2020-09-05 ENCOUNTER — Encounter: Payer: Self-pay | Admitting: Physician Assistant

## 2020-09-05 ENCOUNTER — Other Ambulatory Visit: Payer: Self-pay | Admitting: Physician Assistant

## 2020-09-05 ENCOUNTER — Ambulatory Visit (INDEPENDENT_AMBULATORY_CARE_PROVIDER_SITE_OTHER): Payer: Medicare Other | Admitting: Physician Assistant

## 2020-09-05 VITALS — BP 143/62 | HR 68 | Ht 61.0 in | Wt 151.0 lb

## 2020-09-05 DIAGNOSIS — R0602 Shortness of breath: Secondary | ICD-10-CM | POA: Insufficient documentation

## 2020-09-05 DIAGNOSIS — R079 Chest pain, unspecified: Secondary | ICD-10-CM

## 2020-09-05 DIAGNOSIS — E782 Mixed hyperlipidemia: Secondary | ICD-10-CM

## 2020-09-05 DIAGNOSIS — Z79899 Other long term (current) drug therapy: Secondary | ICD-10-CM

## 2020-09-05 DIAGNOSIS — F339 Major depressive disorder, recurrent, unspecified: Secondary | ICD-10-CM

## 2020-09-05 DIAGNOSIS — I10 Essential (primary) hypertension: Secondary | ICD-10-CM

## 2020-09-05 DIAGNOSIS — R002 Palpitations: Secondary | ICD-10-CM

## 2020-09-05 DIAGNOSIS — F419 Anxiety disorder, unspecified: Secondary | ICD-10-CM

## 2020-09-05 MED ORDER — LISINOPRIL 5 MG PO TABS: ORAL_TABLET | ORAL | 1 refills | Status: DC

## 2020-09-05 MED ORDER — BUPROPION HCL ER (XL) 300 MG PO TB24: ORAL_TABLET | ORAL | 3 refills | Status: DC

## 2020-09-05 MED ORDER — BELSOMRA 10 MG PO TABS: 1.0000 | ORAL_TABLET | Freq: Every day | ORAL | 0 refills | Status: DC

## 2020-09-05 MED ORDER — BELSOMRA 10 MG PO TABS: 1.0000 | ORAL_TABLET | Freq: Every day | ORAL | 1 refills | Status: DC

## 2020-09-05 MED ORDER — SIMVASTATIN 10 MG PO TABS: ORAL_TABLET | ORAL | 3 refills | Status: DC

## 2020-09-05 MED ORDER — LORAZEPAM 0.5 MG PO TABS: 0.5000 mg | ORAL_TABLET | Freq: Three times a day (TID) | ORAL | 0 refills | Status: DC

## 2020-09-05 NOTE — Patient Instructions (Signed)
Palpitations Palpitations are feelings that your heartbeat is not normal. Your heartbeat may feel like it is:  Uneven.  Faster than normal.  Fluttering.  Skipping a beat. This is usually not a serious problem. In some cases, you may need tests to rule out any serious problems. Follow these instructions at home: Pay attention to any changes in your condition. Take these actions to help manage your symptoms: Eating and drinking  Avoid: ? Coffee, tea, soft drinks, and energy drinks. ? Chocolate. ? Alcohol. ? Diet pills. Lifestyle   Try to lower your stress. These things can help you relax: ? Yoga. ? Deep breathing and meditation. ? Exercise. ? Using words and images to create positive thoughts (guided imagery). ? Using your mind to control things in your body (biofeedback).  Do not use drugs.  Get plenty of rest and sleep. Keep a regular bed time. General instructions   Take over-the-counter and prescription medicines only as told by your doctor.  Do not use any products that contain nicotine or tobacco, such as cigarettes and e-cigarettes. If you need help quitting, ask your doctor.  Keep all follow-up visits as told by your doctor. This is important. You may need more tests if palpitations do not go away or get worse. Contact a doctor if:  Your symptoms last more than 24 hours.  Your symptoms occur more often. Get help right away if you:  Have chest pain.  Feel short of breath.  Have a very bad headache.  Feel dizzy.  Pass out (faint). Summary  Palpitations are feelings that your heartbeat is uneven or faster than normal. It may feel like your heart is fluttering or skipping a beat.  Avoid food and drinks that may cause palpitations. These include caffeine, chocolate, and alcohol.  Try to lower your stress. Do not smoke or use drugs.  Get help right away if you faint or have chest pain, shortness of breath, a severe headache, or dizziness. This  information is not intended to replace advice given to you by your health care provider. Make sure you discuss any questions you have with your health care provider. Document Revised: 01/14/2018 Document Reviewed: 01/14/2018 Elsevier Patient Education  2020 Elsevier Inc.  

## 2020-09-05 NOTE — Progress Notes (Signed)
Subjective:    Patient ID: Chelsey Estes, female    DOB: Apr 06, 1941, 79 y.o.   MRN: [id number]  HPI  Patient is a 79 year old female with primary parkinsonism, hypertension, osteoporosis, major depression and insomnia who presents to the clinic for medication refills.  Patient sees neurology for Parkinson treatment.  Patient continues to have ongoing problems with insomnia.  She tried trazodone and it did not do well with her.  She felt very weird and groggy.  She denies any excessive caffeine intake.  She reports to drink 1 cup of coffee in the morning.  She has trouble going and staying asleep.  She has been using her husband's lorazepam and it does help.  She wishes she could have some.  Patient also reports some palpitations and shortness of breath that started around last Tuesday.  She is always a little anxious but nothing has recently changed.  She denies any start of new medications.  She denies any swelling of her lower extremities.  She denies any symptoms with exertion.  She always feels very chest tightness, palpitations, shortness of breath when she is lying down.  .. Active Ambulatory Problems    Diagnosis Date Noted   History of shingles 04/08/2017   Adenomatous polyp 04/08/2017   Pure hypercholesterolemia 04/08/2017   History of kidney stones 04/08/2017   Essential hypertension 04/08/2017   Depression, recurrent ([place]) 04/08/2017   Anxiety 04/08/2017   Greater trochanteric bursitis of left hip 04/08/2017   Left hip pain 04/10/2017   Metatarsalgia of left foot 08/20/2018   Chronic foot pain, right 08/20/2018   Primary osteoarthritis of right foot 09/25/2018   Snoring 09/25/2018   Night terrors, adult 09/25/2018   Multiple falls 09/25/2018   Orthostatic hypotension 02/01/2019   Dizziness 02/01/2019   Moderate episode of recurrent major depressive disorder ([place]) 02/09/2019   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 02/09/2019   Shuffling gait 02/09/2019    Balance problems 02/09/2019   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     Objective:   Physical Exam Vitals reviewed.  Constitutional:      Appearance: Normal appearance.  HENT:     Head: Normocephalic.  Neck:     Vascular: No carotid bruit.  Cardiovascular:     Rate and Rhythm: Normal rate and regular rhythm.     Pulses: Normal pulses.  Pulmonary:     Effort: Pulmonary effort is normal.     Breath sounds: Normal breath sounds.  Musculoskeletal:     Right lower leg: No edema.     Left lower leg: No edema.  Neurological:     General: No focal deficit present.     Mental Status: She is alert and oriented to person, place, and time.  Psychiatric:        Mood and Affect: Mood normal.           Assessment & Plan:  Marland KitchenMarland Kitchen  Chelsey Estes was seen today for insomnia.  Diagnoses and all orders for this visit:  Palpitations -     Cancel: COMPLETE METABOLIC PANEL WITH GFR -     Cancel: TSH -     Cancel: CBC -     CBC with Differential/Platelet -     COMPLETE METABOLIC PANEL WITH GFR -     TSH -     EKG 12-Lead  SOB (shortness of breath) -     Cancel: COMPLETE METABOLIC PANEL WITH GFR -     Cancel: TSH -     Cancel: CBC -     CBC with Differential/Platelet -     COMPLETE METABOLIC PANEL WITH GFR -     TSH  Chest pain, unspecified type -     Cancel: COMPLETE METABOLIC PANEL WITH GFR -     Cancel: TSH -      Cancel: CBC -     CBC with Differential/Platelet -     COMPLETE METABOLIC PANEL WITH GFR -     TSH -     EKG 12-Lead  Medication management -     Cancel: COMPLETE METABOLIC PANEL WITH GFR -     Cancel: TSH -     Cancel: CBC -     CBC with Differential/Platelet -     COMPLETE METABOLIC PANEL WITH GFR -     TSH  Anxiety -     buPROPion (WELLBUTRIN XL) 300 MG 24 hr tablet; Take one tablet daily. -     CBC with Differential/Platelet -     COMPLETE METABOLIC PANEL WITH GFR -     TSH  Depression, recurrent (HCC) -     buPROPion (WELLBUTRIN XL) 300 MG 24 hr tablet; Take one tablet daily. -     CBC with Differential/Platelet -     COMPLETE METABOLIC PANEL WITH GFR -     TSH  Essential hypertension -     lisinopril (ZESTRIL) 5 MG tablet; TAKE 1 TABLET(5 MG) BY MOUTH TWICE DAILY -     CBC with Differential/Platelet -     COMPLETE METABOLIC PANEL WITH GFR -     TSH  Mixed hyperlipidemia -     simvastatin (ZOCOR) 10 MG tablet; TAKE 1 TABLET(10 MG) BY MOUTH DAILY -     CBC with Differential/Platelet -     COMPLETE METABOLIC PANEL WITH GFR -     TSH  Other orders -     Discontinue: Suvorexant (BELSOMRA) 10 MG TABS; Take 1 tablet by mouth at bedtime. -     LORazepam (ATIVAN) 0.5 MG tablet; Take 1 tablet (0.5 mg total) by mouth every 8 (eight) hours.   Pt is due for labs. Ordered today.   Discussed sleep and sleep hygiene. Pt really needs to get back in with [place] her counselor to help with sleep steps. Failed trazodone. I do not want her depending on lorazepam for sleep. I did give her some but want her to try belsomnra. Discussed how to take and side effects.   Discussed side effects of breast pain including increased fall risk.  EKG-No changes. No arrhthymias. No significant ST elevation or depression.  TSH/CBC/CMP ordered.  Discussed other reasons for palpitation.  Handout given.  If continues may consider heart monitor or cardiology referral. BP fair. Hx of orthostatic  hypotension do not want to push too low.   Needs prolia. CMP ordered.   Follow up 4-6 weeks sleep/palpitations.

## 2020-09-06 LAB — COMPREHENSIVE METABOLIC PANEL
ALT: 5 IU/L (ref 0–32)
AST: 13 IU/L (ref 0–40)
Albumin/Globulin Ratio: 1.6 (ref 1.2–2.2)
Albumin: 4.1 g/dL (ref 3.7–4.7)
Alkaline Phosphatase: 67 IU/L (ref 44–121)
BUN/Creatinine Ratio: 16 (ref 12–28)
BUN: 15 mg/dL (ref 8–27)
Bilirubin Total: 0.2 mg/dL (ref 0.0–1.2)
CO2: 23 mmol/L (ref 20–29)
Calcium: 10 mg/dL (ref 8.7–10.3)
Chloride: 104 mmol/L (ref 96–106)
Creatinine, Ser: 0.94 mg/dL (ref 0.57–1.00)
GFR calc Af Amer: 67 mL/min/{1.73_m2} (ref >59)
GFR calc non Af Amer: 58 mL/min/{1.73_m2} — ABNORMAL LOW (ref >59)
Globulin, Total: 2.5 g/dL (ref 1.5–4.5)
Glucose: 104 mg/dL — ABNORMAL HIGH (ref 65–99)
Potassium: 3.9 mmol/L (ref 3.5–5.2)
Sodium: 142 mmol/L (ref 134–144)
Total Protein: 6.6 g/dL (ref 6.0–8.5)

## 2020-09-06 LAB — CBC WITH DIFFERENTIAL/PLATELET
Basophils Absolute: 0.1 10*3/uL (ref 0.0–0.2)
Basos: 1 %
EOS (ABSOLUTE): 0.1 10*3/uL (ref 0.0–0.4)
Eos: 1 %
Hematocrit: 44.2 % (ref 34.0–46.6)
Hemoglobin: 14.5 g/dL (ref 11.1–15.9)
Immature Grans (Abs): 0 10*3/uL (ref 0.0–0.1)
Immature Granulocytes: 0 %
Lymphocytes Absolute: 2.8 10*3/uL (ref 0.7–3.1)
Lymphs: 33 %
MCH: 30 pg (ref 26.6–33.0)
MCHC: 32.8 g/dL (ref 31.5–35.7)
MCV: 92 fL (ref 79–97)
Monocytes Absolute: 0.7 10*3/uL (ref 0.1–0.9)
Monocytes: 9 %
Neutrophils Absolute: 4.8 10*3/uL (ref 1.4–7.0)
Neutrophils: 56 %
Platelets: 293 10*3/uL (ref 150–450)
RBC: 4.83 x10E6/uL (ref 3.77–5.28)
RDW: 12.6 % (ref 11.7–15.4)
WBC: 8.5 10*3/uL (ref 3.4–10.8)

## 2020-09-06 LAB — TSH: TSH: 1.01 u[IU]/mL (ref 0.450–4.500)

## 2020-09-06 NOTE — Progress Notes (Signed)
Chelsey Estes,   CBC looks great.  Thyroid perfect.  Kidney function improved since 3 months ago.   No blood cause for palpitations.

## 2020-09-12 ENCOUNTER — Telehealth: Payer: Self-pay | Admitting: Physician Assistant

## 2020-09-12 ENCOUNTER — Encounter: Payer: Self-pay | Admitting: Physician Assistant

## 2020-09-12 NOTE — Telephone Encounter (Signed)
Scheduled with patient.

## 2020-09-13 ENCOUNTER — Telehealth: Payer: Self-pay | Admitting: Physician Assistant

## 2020-09-13 NOTE — Telephone Encounter (Signed)
Received fax form for PA on Belsomra filled out and faxed back to [facility] waiting on determination. - CF

## 2020-09-14 NOTE — Telephone Encounter (Signed)
PA approved valid 08/15/2020-09/14/2021.

## 2020-09-15 ENCOUNTER — Ambulatory Visit (INDEPENDENT_AMBULATORY_CARE_PROVIDER_SITE_OTHER): Payer: Medicare Other | Admitting: Physician Assistant

## 2020-09-15 ENCOUNTER — Other Ambulatory Visit: Payer: Self-pay

## 2020-09-15 ENCOUNTER — Encounter: Payer: Self-pay | Admitting: Physician Assistant

## 2020-09-15 DIAGNOSIS — Z23 Encounter for immunization: Secondary | ICD-10-CM

## 2020-10-03 ENCOUNTER — Ambulatory Visit (INDEPENDENT_AMBULATORY_CARE_PROVIDER_SITE_OTHER): Payer: Medicare Other | Admitting: Physician Assistant

## 2020-10-03 VITALS — BP 140/69 | HR 63 | Ht 61.0 in | Wt 150.0 lb

## 2020-10-03 DIAGNOSIS — M810 Age-related osteoporosis without current pathological fracture: Secondary | ICD-10-CM

## 2020-10-03 DIAGNOSIS — Z1231 Encounter for screening mammogram for malignant neoplasm of breast: Secondary | ICD-10-CM

## 2020-10-03 DIAGNOSIS — F5101 Primary insomnia: Secondary | ICD-10-CM

## 2020-10-03 DIAGNOSIS — I10 Essential (primary) hypertension: Secondary | ICD-10-CM

## 2020-10-03 MED ORDER — DENOSUMAB 60 MG/ML ~~LOC~~ SOSY
60.0000 mg | PREFILLED_SYRINGE | Freq: Once | SUBCUTANEOUS | Status: AC
  Administered 2020-10-03: 60 mg via SUBCUTANEOUS

## 2020-10-03 NOTE — Patient Instructions (Signed)
Continue to try belsomra for the next month.

## 2020-10-03 NOTE — Progress Notes (Signed)
   Subjective:    Patient ID: Chelsey Estes, female    DOB: [birth year], 79 y.o.   MRN: [id number]  HPI  Pt is a 79 yo female with osteoporosis, HtN, parkinsonism, HLD, MDD who presents to the clinic for follow up.   Palpitations are better. She still has the occasionally. She does not drink caffiene.   Needs prolia shot today.   Pt is not really sleeping well. She admits she did not give belsomra chance. She has not been taking it.  .. Active Ambulatory Problems    Diagnosis Date Noted  . History of shingles 04/08/2017  . Adenomatous polyp 04/08/2017  . Pure hypercholesterolemia 04/08/2017  . History of kidney stones 04/08/2017  . Essential hypertension 04/08/2017  . Depression, recurrent ([place]) 04/08/2017  . Anxiety 04/08/2017  . Greater trochanteric bursitis of left hip 04/08/2017  . Left hip pain 04/10/2017  . Metatarsalgia of left foot 08/20/2018  . Chronic foot pain, right 08/20/2018  . Primary osteoarthritis of right foot 09/25/2018  . Snoring 09/25/2018  . Night terrors, adult 09/25/2018  . Multiple falls 09/25/2018  . Orthostatic hypotension 02/01/2019  . Dizziness 02/01/2019  . Moderate episode of recurrent major depressive disorder ([place]) 02/09/2019  . 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 Shuffling gait 02/09/2019  . Balance problems 02/09/2019  . 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  . 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 Blister of great toe of left foot 08/19/2019  . Osteoporosis 09/15/2019  . Primary parkinsonism ([place]) 10/26/2019  . Mixed hyperlipidemia 06/06/2020  . Primary insomnia 06/06/2020  . Palpitations 09/05/2020  . SOB (shortness of breath) 09/05/2020  . Chest pain 09/05/2020   Resolved Ambulatory Problems    Diagnosis Date Noted  . No Resolved Ambulatory Problems   Past Medical History:  Diagnosis Date  . Depression   . Glaucoma   . Hyperlipidemia   . Hypertension   . Kidney stone      Review of Systems  All other  systems reviewed and are negative.      Objective:   Physical Exam Vitals reviewed.  Constitutional:      Appearance: Normal appearance.  Cardiovascular:     Rate and Rhythm: Normal rate and regular rhythm.     Pulses: Normal pulses.  Pulmonary:     Effort: Pulmonary effort is normal.     Breath sounds: Normal breath sounds.  Musculoskeletal:     Cervical back: Normal range of motion.     Right lower leg: No edema.     Left lower leg: No edema.  Lymphadenopathy:     Cervical: No cervical adenopathy.  Neurological:     General: No focal deficit present.     Mental Status: She is alert and oriented to person, place, and time.  Psychiatric:        Mood and Affect: Mood normal.           Assessment & Plan:  Chelsey KitchenMarland KitchenCereniti was seen today for follow-up.  Diagnoses and all orders for this visit:  Age related osteoporosis, unspecified pathological fracture presence -     denosumab (PROLIA) injection 60 mg  Essential hypertension  Visit for screening mammogram -     MM 3D SCREEN BREAST BILATERAL  Primary insomnia   prolia given today.  Give belsomnra more time. Follow up in 1-2 months. Discussed good sleep routine.  BP to goal.  Mammogram ordered.

## 2020-10-06 ENCOUNTER — Encounter: Payer: Self-pay | Admitting: Physician Assistant

## 2020-11-03 NOTE — Progress Notes (Signed)
Assessment/Plan:   1.  Parkinsons Disease  -Likely idiopathic, but did have diplopia early on, so atypical state has been considered but haven't seen this progress as I would except for that.  This has been complicated by the fact that the patient has had multiple eye surgeries.  -Continue carbidopa/levodopa 25/100, 1 tablet 3 times per day.  2.  Diplopia  -Follows with ophthalmology.  Long history of eye surgeries, as above.  3.  GAD/depression  -Mostly related to family situation.  In counseling.  Husband alcoholic, which obviously stressful and she is primary caregiver and he is falling and has failing health  4.  Parkinsons Disease dyskinesia  -mild.  She doesn't notice it much and no med needed   Subjective:   Chelsey Estes was seen today in follow up for Parkinsons disease.  My previous records were reviewed prior to todays visit as well as outside records available to me. Pt denies falls.  Pt denies lightheadedness, near syncope.  No hallucinations.  Mood has been good.   She is exercising "some."  She is the primary caregiver for her husband.  Notes occ dyskinesia in the LE when anxious.  Records from her primary care provider have been reviewed.  Current prescribed movement disorder medications:  Carbidopa/levodopa 25/100, 1 tablet 3 times per day.   PREVIOUS MEDICATIONS: Sinemet  ALLERGIES:   Allergies  Allergen Reactions   Trazodone And Nefazodone Other (See Comments)    Nausea/dizzines/cramps    CURRENT MEDICATIONS:  Outpatient Encounter Medications as of 11/07/2020  Medication Sig   ALPHAGAN P 0.1 % SOLN Place 1 drop into both eyes in the morning and at bedtime.    buPROPion (WELLBUTRIN XL) 300 MG 24 hr tablet Take one tablet daily.   carbidopa-levodopa (SINEMET IR) 25-100 MG tablet Take 1 tablet by mouth 3 (three) times daily.   cyanocobalamin 1000 MCG tablet Take 2,000 mcg by mouth daily.   diclofenac sodium (VOLTAREN) 1 % GEL Apply 4 g topically 4  (four) times daily. To affected joint.   lisinopril (ZESTRIL) 5 MG tablet TAKE 1 TABLET(5 MG) BY MOUTH TWICE DAILY   LORazepam (ATIVAN) 0.5 MG tablet Take 1 tablet (0.5 mg total) by mouth every 8 (eight) hours.   Melatonin 5 MG CHEW Chew 2 tablets by mouth at bedtime.   meloxicam (MOBIC) 15 MG tablet TAKE 1 TABLET(15 MG) BY MOUTH DAILY   simvastatin (ZOCOR) 10 MG tablet TAKE 1 TABLET(10 MG) BY MOUTH DAILY   Suvorexant (BELSOMRA) 10 MG TABS Take 1 tablet by mouth at bedtime.   timolol (TIMOPTIC) 0.5 % ophthalmic solution Place 1 drop into both eyes daily.    No facility-administered encounter medications on file as of 11/07/2020.    Objective:   PHYSICAL EXAMINATION:    VITALS:   Vitals:   11/07/20 1029  BP: 136/77  Pulse: 66  SpO2: 98%  Weight: 150 lb (68 kg)  Height: 5\' 1"  (1.549 m)    GEN:  The patient appears stated age and is in NAD. HEENT:  Normocephalic, atraumatic.  The mucous membranes are moist. The superficial temporal arteries are without ropiness or tenderness. CV:  RRR Lungs:  CTAB Neck/HEME:  There are no carotid bruits bilaterally.  Neurological examination:  Orientation: The patient is alert and oriented x3. Cranial nerves: There is good facial symmetry with mild facial hypomimia. The speech is fluent and clear. Soft palate rises symmetrically and there is no tongue deviation. Hearing is intact to conversational tone. Sensation: Sensation  is intact to light touch throughout Motor: Strength is at least antigravity x4.  Movement examination: Tone: There is normal tone in the UE/LE Abnormal movements: there is dyskinesia in the LE, R>L, overall mild Coordination:  There is no decremation with RAM's, with any form of RAMS, including alternating supination and pronation of the forearm, hand opening and closing, finger taps, heel taps and toe taps. Gait and Station: The patient has no difficulty arising out of a deep-seated chair without the use of the  hands. The patient's stride length is good.    I have reviewed and interpreted the following labs independently    Chemistry      Component Value Date/Time   NA 142 09/05/2020 1426   K 3.9 09/05/2020 1426   CL 104 09/05/2020 1426   CO2 23 09/05/2020 1426   BUN 15 09/05/2020 1426   CREATININE 0.94 09/05/2020 1426   CREATININE 0.96 (H) 06/08/2020 1036   GLU 109 09/25/2016 0000      Component Value Date/Time   CALCIUM 10.0 09/05/2020 1426   ALKPHOS 67 09/05/2020 1426   AST 13 09/05/2020 1426   ALT 5 09/05/2020 1426   BILITOT 0.2 09/05/2020 1426       Lab Results  Component Value Date   WBC 8.5 09/05/2020   HGB 14.5 09/05/2020   HCT 44.2 09/05/2020   MCV 92 09/05/2020   PLT 293 09/05/2020    Lab Results  Component Value Date   TSH 1.010 09/05/2020     Total time spent on today's visit was 20 minutes, including both face-to-face time and nonface-to-face time.  Time included that spent on review of records (prior notes available to me/labs/imaging if pertinent), discussing treatment and goals, answering patient's questions and coordinating care.  Cc:  Donella Stade, PA-C

## 2020-11-06 ENCOUNTER — Ambulatory Visit: Payer: Federal, State, Local not specified - PPO | Admitting: Neurology

## 2020-11-07 ENCOUNTER — Ambulatory Visit (INDEPENDENT_AMBULATORY_CARE_PROVIDER_SITE_OTHER): Payer: Medicare Other | Admitting: Neurology

## 2020-11-07 ENCOUNTER — Encounter: Payer: Self-pay | Admitting: Neurology

## 2020-11-07 ENCOUNTER — Other Ambulatory Visit: Payer: Self-pay

## 2020-11-07 VITALS — BP 136/77 | HR 66 | Ht 61.0 in | Wt 150.0 lb

## 2020-11-07 DIAGNOSIS — G20 Parkinson's disease: Secondary | ICD-10-CM

## 2020-11-07 MED ORDER — CARBIDOPA-LEVODOPA 25-100 MG PO TABS
1.0000 | ORAL_TABLET | Freq: Three times a day (TID) | ORAL | 1 refills | Status: DC
Start: 2020-11-07 — End: 2021-07-03

## 2020-11-07 NOTE — Patient Instructions (Signed)
Parkinsons [employer]   . Local [place] Online Groups  o Power over [employer] Group :   - Power Over Parkinson's Patient Education Group will be Wednesday, November 10th at 2pm via [place].   - Upcoming Power over Parkinson's Meetings:  2nd Wednesdays of the month at 2 pm:       December 8th, January 12th - Amy Marriott, PT at [facility] has resumed the lead of this group starting in July.  Contact Amy at [email or web address] if interested in participating in this online group o Parkinson's Care Partners Group:    3rd Mondays, Contact Corwin Levins o Atypical Parkinsonian Patient Group:   4th Wednesdays, Contact Corwin Levins o If you are interested in participating in these online groups with Judson Roch, please contact her directly for how to join those meetings.  Her contact information is [email or web address].  She will send you a link to join the [employer].  (Please note that Corwin Levins , MSW, LCSW, has resigned her position at [facility], but will continue to lead the online groups temporarily) .  Marland Kitchen [place]:  [email or web address] o PD Health at Home continues:  Mindfulness Mondays, Expert Briefing Tuesdays, Wellness Wednesdays, Take Time Thursdays, Fitness Fridays  o Upcoming Webinar:  The Skinny on Skin and Bone Health in Parkinson's.  Wednesday, December 1st at 1 pm o Please check out their website to sign up for emails and see their full online offerings .  Marland Kitchen [place]:  [email or web address]  o Upcoming Webinar:   Steps Closer to Stopping Parkinson's:  2021 Research Review, Thursday, November 18th at 12 noon. o Check out additional information on their website to see their full online offerings .  Marland Kitchen [place]:  [email or web address] o Upcoming Webinar:  Non-Motor Symptom Medications in Parkinson's.  Wednesday, November 10th at 2 pm. o Care Partner Monthly Meetup.  With  Robin Searing [place].  First Tuesday of each month, 2 pm o Check out additional information to Live Well Today on their website .  Marland Kitchen Parkinson and Movement Disorders (PMD) Alliance:  www.pmdalliance.org o [employer] Online:  Online Education Events o Sign up for emails, which are sent weekly to give you updates on programming and online offerings .  Marland Kitchen Parkinson's Association of the [place]:  www.[email or web address] o Information on online support groups, online exercises including Yoga, Parkinson's exercises and more-LOTS of information on links to PD resources and online events o Virtual Support Group through Parkinson's Association of the [place]; next one is scheduled for Wednesday, November 15, 2020 at 2 pm. (These are typically scheduled for the 1st Wednesday of the month at 2 pm).  Visit website for details. .  . Additional links for movement activities: o PWR! Moves Classes at [place] RESUMED, at a limited capacity.  We have several openings for Wednesday 10 am and 11 am classes.  Contact Amy Marriott, PT [email or web address] or [phone] if interested o Here is a link to the PWR!Moves classes on Zoom from [place] - Daily Mon-Sat at 10:00. Via Zoom, FREE and open to all.  There is also a link below via Facebook if you use that platform. - [email or web address].si - https://www.[email or web address].no o Parkinson's Wellness Recovery (PWR! Moves)  www.pwr4life.org - Info on the PWR! Virtual Experience:  You will have access to our expertise through self-assessment, guided plans that start with the PD-specific fundamentals, educational content, tips, Q&A with an expert, and a growing Art therapist of PD-specific pre-recorded and live exercise  classes of varying types and  intensity - both physical and cognitive! If that is not enough, we offer 1:1 wellness consultations (in-person or virtual) to personalize your PWR! Research scientist (medical).  - Check out the PWR! Move of the month on the [place] [facility] website:  [email or web address]/ o [employer] Fridays:  - As part of the PD Health @ Home program, this free video series focuses each week on one aspect of fitness designed to support people living with Parkinson's.  -  HollywoodSale.dk o Dance for PD website is offering free, live-stream classes throughout the week, as well as links to [employer] of classes:  https://danceforparkinsons.[email or web address]/ o Transport planner for Parkinson's Class:  [place] is back this Fall!  Free offering for people with Parkinson's and care partners; virtual class this Fall. The class will be Wednesdays 4-5pm beginning 10/13.  Classes will run for 9 weeks 10/13-12/15, with no class on 11/24.  Register below: o https://[email or web address].[email or web address]/[email or web address]?page=Enroll&meth=search&SEASON=Parkinsons+Dance-Fall+2021  o For more information, contact [phone] or email Ruffin Frederick at [email or web address] o Virtual dance and Pilates for Parkinson's classes: Click on the Community Tab> Parkinson's Movement Initiative Tab.  To register for classes and for more information, visit www.[email or web address].co.za and click the "community" tab.  o [employer] Parkinson's Cycling Classes  - [facility] [employer]: 1pm on Fridays-Live classes at [facility] [employer] at [email or web address] or [phone]) [facility] [employer]: Virtual Classes Mondays and Thursdays (contact [place] at [email or web address] or [phone]) .  o [employer] - Three levels of classes are offered Tuesdays and Thursdays:  10:30 am,   12 noon & 1:45 pm at [employer]. To observe a class or for  more information, call [phone] or email [email or web address] . Well[employer]Spring Solutions: o Chief Technology Officer Opportunities:  www.well-springsolutions.org/caregiver-education/caregiver-support-group.  You may also contact Vickki Muff at [email or web address] or [phone].   o Caregiver Virtual Event:   Well-Spring is Partnering with [employer], on Friday, November 19th from 11:30-12:30 for a virtual event - Contact 12-27-1998 (above) for details o Well-Spring Navigator:  Just1Navigator program, a free service to help individuals and families through the journey of determining care for older adults.  The "Navigator" is a Vickki Muff, Education officer, museum, who will speak with a prospective client and/or loved ones to provide an assessment of the situation and a set of recommendations for a personalized care plan -- all free of charge, and whether [facility] [employer] offers the needed service or not. If the need is not a service we provide, we are well-connected with reputable programs in town that we can refer you to.  www.well-springsolutions.org or to speak with the Navigator, call [phone].

## 2020-12-01 ENCOUNTER — Other Ambulatory Visit: Payer: Self-pay | Admitting: Physician Assistant

## 2020-12-01 DIAGNOSIS — M545 Low back pain, unspecified: Secondary | ICD-10-CM

## 2020-12-07 ENCOUNTER — Other Ambulatory Visit: Payer: Self-pay | Admitting: Physician Assistant

## 2020-12-16 ENCOUNTER — Encounter: Payer: Self-pay | Admitting: Physician Assistant

## 2020-12-19 MED ORDER — BELSOMRA 10 MG PO TABS: 1.0000 | ORAL_TABLET | Freq: Every day | ORAL | 1 refills | Status: DC

## 2021-01-17 ENCOUNTER — Ambulatory Visit: Payer: Medicare Other | Admitting: Psychology

## 2021-01-31 ENCOUNTER — Ambulatory Visit: Payer: Medicare Other | Admitting: Psychology

## 2021-03-01 ENCOUNTER — Other Ambulatory Visit: Payer: Self-pay | Admitting: Physician Assistant

## 2021-03-01 DIAGNOSIS — I10 Essential (primary) hypertension: Secondary | ICD-10-CM

## 2021-03-05 DIAGNOSIS — H401112 Primary open-angle glaucoma, right eye, moderate stage: Secondary | ICD-10-CM | POA: Diagnosis not present

## 2021-03-05 DIAGNOSIS — H401121 Primary open-angle glaucoma, left eye, mild stage: Secondary | ICD-10-CM | POA: Diagnosis not present

## 2021-03-06 ENCOUNTER — Encounter: Payer: Self-pay | Admitting: Physician Assistant

## 2021-03-09 ENCOUNTER — Other Ambulatory Visit: Payer: Self-pay

## 2021-03-09 ENCOUNTER — Ambulatory Visit (INDEPENDENT_AMBULATORY_CARE_PROVIDER_SITE_OTHER): Payer: Medicare Other | Admitting: Sports Medicine

## 2021-03-09 ENCOUNTER — Ambulatory Visit (INDEPENDENT_AMBULATORY_CARE_PROVIDER_SITE_OTHER): Payer: Medicare Other

## 2021-03-09 DIAGNOSIS — M5416 Radiculopathy, lumbar region: Secondary | ICD-10-CM | POA: Diagnosis not present

## 2021-03-09 DIAGNOSIS — M545 Low back pain, unspecified: Secondary | ICD-10-CM | POA: Diagnosis not present

## 2021-03-09 MED ORDER — PREDNISONE 50 MG PO TABS: ORAL_TABLET | ORAL | 0 refills | Status: DC

## 2021-03-09 MED ORDER — GABAPENTIN 300 MG PO CAPS: 300.0000 mg | ORAL_CAPSULE | Freq: Every day | ORAL | 3 refills | Status: DC

## 2021-03-09 NOTE — Assessment & Plan Note (Signed)
This is a pleasant 80 year old female, she is been having pain in her back radiation down the buttock to the middle 3 toes consistent with a right L5 radiculopathy. We will start conservatively with physical therapy, prednisone, Neurontin at night, x-rays. Return to see me in 6 weeks, MRI for interventional planning if no better, she likely has spinal stenosis.

## 2021-03-09 NOTE — Progress Notes (Signed)
° ° °  Procedures performed today:    None.  Independent interpretation of notes and tests performed by another provider:   None.  Brief History, Exam, Impression, and Recommendations:    Right lumbar radiculitis This is a pleasant 80 year old female, she is been having pain in her back radiation down the buttock to the middle 3 toes consistent with a right L5 radiculopathy. We will start conservatively with physical therapy, prednisone, Neurontin at night, x-rays. Return to see me in 6 weeks, MRI for interventional planning if no better, she likely has spinal stenosis.    ___________________________________________ Gwen Her. Dianah Field, M.D., ABFM., CAQSM. Primary Care and [place] Instructor of [place] of [facility] of Medicine

## 2021-03-14 ENCOUNTER — Ambulatory Visit (INDEPENDENT_AMBULATORY_CARE_PROVIDER_SITE_OTHER): Payer: Medicare Other

## 2021-03-14 ENCOUNTER — Other Ambulatory Visit: Payer: Self-pay

## 2021-03-14 DIAGNOSIS — Z1231 Encounter for screening mammogram for malignant neoplasm of breast: Secondary | ICD-10-CM | POA: Diagnosis not present

## 2021-03-15 DIAGNOSIS — Z87442 Personal history of urinary calculi: Secondary | ICD-10-CM | POA: Diagnosis not present

## 2021-03-15 DIAGNOSIS — N200 Calculus of kidney: Secondary | ICD-10-CM | POA: Diagnosis not present

## 2021-03-16 NOTE — Progress Notes (Signed)
Normal mammogram

## 2021-03-18 DIAGNOSIS — M5416 Radiculopathy, lumbar region: Secondary | ICD-10-CM | POA: Diagnosis not present

## 2021-03-19 MED ORDER — GABAPENTIN 100 MG PO CAPS: ORAL_CAPSULE | ORAL | 11 refills | Status: DC

## 2021-03-19 NOTE — Addendum Note (Signed)
Addended by: Silverio Decamp on: 03/19/2021 05:25 PM   Modules accepted: Orders

## 2021-03-20 MED ORDER — TRAMADOL HCL 50 MG PO TABS: 50.0000 mg | ORAL_TABLET | Freq: Three times a day (TID) | ORAL | 0 refills | Status: DC | PRN

## 2021-03-20 NOTE — Addendum Note (Signed)
Addended by: Silverio Decamp on: 03/20/2021 08:47 AM   Modules accepted: Orders

## 2021-03-27 DIAGNOSIS — R590 Localized enlarged lymph nodes: Secondary | ICD-10-CM | POA: Diagnosis not present

## 2021-03-27 DIAGNOSIS — N200 Calculus of kidney: Secondary | ICD-10-CM | POA: Diagnosis not present

## 2021-03-27 DIAGNOSIS — K5730 Diverticulosis of large intestine without perforation or abscess without bleeding: Secondary | ICD-10-CM | POA: Diagnosis not present

## 2021-03-27 DIAGNOSIS — R109 Unspecified abdominal pain: Secondary | ICD-10-CM | POA: Diagnosis not present

## 2021-03-27 DIAGNOSIS — K449 Diaphragmatic hernia without obstruction or gangrene: Secondary | ICD-10-CM | POA: Diagnosis not present

## 2021-03-28 DIAGNOSIS — M545 Low back pain, unspecified: Secondary | ICD-10-CM | POA: Diagnosis not present

## 2021-03-30 DIAGNOSIS — M545 Low back pain, unspecified: Secondary | ICD-10-CM | POA: Diagnosis not present

## 2021-04-02 MED ORDER — HYDROCODONE-ACETAMINOPHEN 10-325 MG PO TABS: 1.0000 | ORAL_TABLET | Freq: Three times a day (TID) | ORAL | 0 refills | Status: DC | PRN

## 2021-04-02 NOTE — Addendum Note (Signed)
Addended by: Silverio Decamp on: 04/02/2021 11:47 AM   Modules accepted: Orders

## 2021-04-02 NOTE — Telephone Encounter (Signed)
I spent 5 total minutes of online digital evaluation and management services. 

## 2021-04-03 ENCOUNTER — Ambulatory Visit: Payer: Medicare Other | Admitting: Physician Assistant

## 2021-04-03 DIAGNOSIS — M545 Low back pain, unspecified: Secondary | ICD-10-CM | POA: Diagnosis not present

## 2021-04-06 DIAGNOSIS — M545 Low back pain, unspecified: Secondary | ICD-10-CM | POA: Diagnosis not present

## 2021-04-09 ENCOUNTER — Ambulatory Visit (INDEPENDENT_AMBULATORY_CARE_PROVIDER_SITE_OTHER): Payer: Medicare Other | Admitting: Physician Assistant

## 2021-04-09 ENCOUNTER — Other Ambulatory Visit: Payer: Self-pay

## 2021-04-09 VITALS — BP 134/78 | HR 70 | Ht 61.0 in | Wt 151.0 lb

## 2021-04-09 DIAGNOSIS — I10 Essential (primary) hypertension: Secondary | ICD-10-CM

## 2021-04-09 DIAGNOSIS — G20 Parkinson's disease: Secondary | ICD-10-CM

## 2021-04-09 DIAGNOSIS — M5416 Radiculopathy, lumbar region: Secondary | ICD-10-CM

## 2021-04-09 DIAGNOSIS — M5136 Other intervertebral disc degeneration, lumbar region: Secondary | ICD-10-CM | POA: Diagnosis not present

## 2021-04-09 MED ORDER — HYDROCODONE-ACETAMINOPHEN 10-325 MG PO TABS: 1.0000 | ORAL_TABLET | Freq: Four times a day (QID) | ORAL | 0 refills | Status: DC | PRN

## 2021-04-09 MED ORDER — KETOROLAC TROMETHAMINE 60 MG/2ML IM SOLN
60.0000 mg | Freq: Once | INTRAMUSCULAR | Status: AC
  Administered 2021-04-09: 60 mg via INTRAMUSCULAR

## 2021-04-09 NOTE — Patient Instructions (Signed)
Increase the gabapentin to twice a day on day she does not have to drive.  Get cmp.  Continue with PT.

## 2021-04-09 NOTE — Progress Notes (Signed)
Subjective:    Patient ID: Chelsey Estes, female    DOB: January 27, 1941, 80 y.o.   MRN: [id number]  HPI  Patient is an 80 year old female with lumbar degenerative disc disease and right lumbar radiculitis who presents to the clinic for follow-up.  She was not able to get in with Chelsey Estes today and it was in a lot of pain.  She is scheduled to see him on May 6.  Her pain is worsening.  She feels less stable in her lower back.  Prednisone did not seem to help significantly.  She is taking gabapentin at bedtime.  She is concerned about taking it during the day if she has to be active. mobic causing some GI upset. Denies any melena or hematochezia. She does have Parkinson's disease and had a higher fall risk.  She is also having to be a caregiver to her husband who is health is not great right now.  Chelsey Estes did give her some hydrocodone which seems to be helping the most with pain.  She is almost out. She just started PT but not noticed any benefits.   Denies any saddle anesthesia, bowel or bladder dysfunction, leg weakness.   .. Active Ambulatory Problems    Diagnosis Date Noted  . History of shingles 04/08/2017  . Adenomatous polyp 04/08/2017  . Pure hypercholesterolemia 04/08/2017  . History of kidney stones 04/08/2017  . Essential hypertension 04/08/2017  . Depression, recurrent (HCC) 04/08/2017  . Anxiety 04/08/2017  . Greater trochanteric bursitis of left hip 04/08/2017  . Left hip pain 04/10/2017  . Metatarsalgia of left foot 08/20/2018  . Chronic foot pain, right 08/20/2018  . Primary osteoarthritis of right foot 09/25/2018  . Snoring 09/25/2018  . Night terrors, adult 09/25/2018  . Multiple falls 09/25/2018  . Orthostatic hypotension 02/01/2019  . Dizziness 02/01/2019  . Moderate episode of recurrent major depressive disorder (HCC) 02/09/2019  . 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 Shuffling gait 02/09/2019  . Balance problems 02/09/2019  . Kyphosis (acquired)  (postural) 02/09/2019  . 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 Blister of great toe of left foot 08/19/2019  . Osteoporosis 09/15/2019  . Primary parkinsonism (HCC) 10/26/2019  . Mixed hyperlipidemia 06/06/2020  . Primary insomnia 06/06/2020  . Palpitations 09/05/2020  . SOB (shortness of breath) 09/05/2020  . Chest pain 09/05/2020  . Right lumbar radiculitis 03/09/2021  . DDD (degenerative disc disease), lumbar 04/11/2021   Resolved Ambulatory Problems    Diagnosis Date Noted  . No Resolved Ambulatory Problems   Past Medical History:  Diagnosis Date  . Depression   . Glaucoma   . Hyperlipidemia   . Hypertension   . Kidney stone      Review of Systems See HPI.     Objective:   Physical Exam Vitals reviewed.  Constitutional:      Appearance: Normal appearance.  HENT:     Head: Normocephalic.  Cardiovascular:     Rate and Rhythm: Normal rate and regular rhythm.     Pulses: Normal pulses.  Pulmonary:     Effort: Pulmonary effort is normal.  Musculoskeletal:     Comments: Flexion at waist worsening pain.  No lumbar spine pain to palpation.  5/5 lower ext strength.  Neurological:     General: No focal deficit present.     Mental Status: She is alert and oriented to person, place, and time.  Psychiatric:        Mood and Affect: Mood normal.  Assessment & Plan:  Chelsey Estes KitchenMarland KitchenMarabella was seen today for follow-up.  Diagnoses and all orders for this visit:  Right lumbar radiculitis -     HYDROcodone-acetaminophen (NORCO) 10-325 MG tablet; Take 1 tablet by mouth every 6 (six) hours as needed. -     ketorolac (TORADOL) injection 60 mg  Essential hypertension -     Cancel: COMPLETE METABOLIC PANEL WITH GFR -     CMP14+EGFR  DDD (degenerative disc disease), lumbar   Continue follow up with Chelsey Estes.  I will send message to see if he wants to go ahead and order MRI since symptoms are worsening.  Her parkinson and high fall risk makes it hard for her to take  gabapentin/hydrocodone.  toradol given IM today. Hold mobic due to GI upset. Discussed fall risk of hydrocodone and how we want to limit use.  Increase gabapentin to up to three times a day. Watch for sedation.  Continue tens unit/icy hot patches.  Continue PT.   ..PDMP reviewed during this encounter. No concerns.  Use sparingly.   BP to goal. BP meds just refilled. Needs CMP.

## 2021-04-10 DIAGNOSIS — M545 Low back pain, unspecified: Secondary | ICD-10-CM | POA: Diagnosis not present

## 2021-04-10 LAB — CMP14+EGFR
ALT: 7 IU/L (ref 0–32)
AST: 13 IU/L (ref 0–40)
Albumin/Globulin Ratio: 1.8 (ref 1.2–2.2)
Albumin: 4.2 g/dL (ref 3.7–4.7)
Alkaline Phosphatase: 66 IU/L (ref 44–121)
BUN/Creatinine Ratio: 21 (ref 12–28)
BUN: 25 mg/dL (ref 8–27)
Bilirubin Total: <0.2 mg/dL (ref 0.0–1.2)
CO2: 21 mmol/L (ref 20–29)
Calcium: 10.1 mg/dL (ref 8.7–10.3)
Chloride: 104 mmol/L (ref 96–106)
Creatinine, Ser: 1.18 mg/dL — ABNORMAL HIGH (ref 0.57–1.00)
Globulin, Total: 2.3 g/dL (ref 1.5–4.5)
Glucose: 90 mg/dL (ref 65–99)
Potassium: 4.4 mmol/L (ref 3.5–5.2)
Sodium: 140 mmol/L (ref 134–144)
Total Protein: 6.5 g/dL (ref 6.0–8.5)
eGFR: 47 mL/min/{1.73_m2} — ABNORMAL LOW (ref >59)

## 2021-04-11 ENCOUNTER — Encounter: Payer: Self-pay | Admitting: Physician Assistant

## 2021-04-11 DIAGNOSIS — M5136 Other intervertebral disc degeneration, lumbar region: Secondary | ICD-10-CM | POA: Insufficient documentation

## 2021-04-11 DIAGNOSIS — M51369 Other intervertebral disc degeneration, lumbar region without mention of lumbar back pain or lower extremity pain: Secondary | ICD-10-CM | POA: Insufficient documentation

## 2021-04-11 NOTE — Progress Notes (Signed)
[place],   Liver and electrolytes look great.  Glucose is normal range.  Kidney function has decreased some.  Make sure drinking enough fluids. Do not take any ibuprofen, aleve, goodys or anti-inflammatory. Recheck in 4 weeks to make sure not trending up but staying stable.

## 2021-04-12 ENCOUNTER — Encounter: Payer: Self-pay | Admitting: Physician Assistant

## 2021-04-12 DIAGNOSIS — M5416 Radiculopathy, lumbar region: Secondary | ICD-10-CM

## 2021-04-13 ENCOUNTER — Ambulatory Visit: Payer: Federal, State, Local not specified - PPO | Admitting: Neurology

## 2021-04-13 ENCOUNTER — Encounter: Payer: Self-pay | Admitting: Neurology

## 2021-04-13 DIAGNOSIS — N289 Disorder of kidney and ureter, unspecified: Secondary | ICD-10-CM

## 2021-04-13 NOTE — Telephone Encounter (Signed)
Ok I will communicate the need to wait 6 weeks. She will be seeing you soon.

## 2021-04-13 NOTE — Telephone Encounter (Signed)
I am just being cautious, in general insurance will deny an MRI if there has not been a solid 6 weeks and then we will not be able to order it again for at least 3 months.  In the absence of true red flags such as cauda equina symptoms we just have to control her pain.  I am happy to keep this going with the hydrocodone.  Looks like she is also in the throes of passing a kidney stone.

## 2021-04-17 DIAGNOSIS — M545 Low back pain, unspecified: Secondary | ICD-10-CM | POA: Diagnosis not present

## 2021-04-17 MED ORDER — HYDROCODONE-ACETAMINOPHEN 10-325 MG PO TABS: 1.0000 | ORAL_TABLET | Freq: Four times a day (QID) | ORAL | 0 refills | Status: DC | PRN

## 2021-04-20 ENCOUNTER — Encounter: Payer: Self-pay | Admitting: Sports Medicine

## 2021-04-20 ENCOUNTER — Other Ambulatory Visit: Payer: Self-pay

## 2021-04-20 ENCOUNTER — Ambulatory Visit (INDEPENDENT_AMBULATORY_CARE_PROVIDER_SITE_OTHER): Payer: Medicare Other | Admitting: Sports Medicine

## 2021-04-20 DIAGNOSIS — M7138 Other bursal cyst, other site: Secondary | ICD-10-CM | POA: Diagnosis not present

## 2021-04-20 DIAGNOSIS — M5416 Radiculopathy, lumbar region: Secondary | ICD-10-CM | POA: Diagnosis not present

## 2021-04-20 DIAGNOSIS — M545 Low back pain, unspecified: Secondary | ICD-10-CM | POA: Diagnosis not present

## 2021-04-20 NOTE — Assessment & Plan Note (Addendum)
This is a pleasant 80 year old female, she has now had greater than 6 weeks of conservative treatment for a chronic process, lumbar DDD with right-sided L5 distribution radiculopathy, she has done physical therapy, steroids, Neurontin, we had to bump her up to hydrocodone. Still with significant discomfort, worsening pain with regards to this chronic process, so we will proceed with MRI for epidural planning, I will likely order a right L5-S1 transforaminal epidural once I see her MRI results. She can do 300 of gabapentin at night and 100 of gabapentin in the morning. Refill hydrocodone when she needs it. See me 1 month after injection.  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  If unable to successfully address the facet cyst directly we will try an epidural.

## 2021-04-20 NOTE — Progress Notes (Addendum)
    Procedures performed today:    None.  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  Brief History, Exam, Impression, and Recommendations:    Right lumbar radiculitis This is a pleasant 80 year old female, she has now had greater than 6 weeks of conservative treatment for a chronic process, lumbar DDD with right-sided L5 distribution radiculopathy, she has done physical therapy, steroids, Neurontin, we had to bump her up to hydrocodone. Still with significant discomfort, worsening pain with regards to this chronic process, so we will proceed with MRI for epidural planning, I will likely order a right L5-S1 transforaminal epidural once I see her MRI results. She can do 300 of gabapentin at night and 100 of gabapentin in the morning. Refill hydrocodone when she needs it. See me 1 month after injection.  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  If unable to successfully address the facet cyst directly we will try an epidural.    ___________________________________________ Gwen Her. Dianah Field, M.D., ABFM., CAQSM. Primary Care and [place] Instructor of [place] [facility]

## 2021-04-23 ENCOUNTER — Ambulatory Visit (INDEPENDENT_AMBULATORY_CARE_PROVIDER_SITE_OTHER): Payer: Medicare Other

## 2021-04-23 ENCOUNTER — Other Ambulatory Visit: Payer: Self-pay

## 2021-04-23 DIAGNOSIS — M4726 Other spondylosis with radiculopathy, lumbar region: Secondary | ICD-10-CM | POA: Diagnosis not present

## 2021-04-23 DIAGNOSIS — M5416 Radiculopathy, lumbar region: Secondary | ICD-10-CM

## 2021-04-23 DIAGNOSIS — M7138 Other bursal cyst, other site: Secondary | ICD-10-CM | POA: Diagnosis not present

## 2021-04-23 DIAGNOSIS — M5115 Intervertebral disc disorders with radiculopathy, thoracolumbar region: Secondary | ICD-10-CM | POA: Diagnosis not present

## 2021-04-23 DIAGNOSIS — M48061 Spinal stenosis, lumbar region without neurogenic claudication: Secondary | ICD-10-CM | POA: Diagnosis not present

## 2021-04-24 ENCOUNTER — Other Ambulatory Visit: Payer: Self-pay | Admitting: Sports Medicine

## 2021-04-24 DIAGNOSIS — M5416 Radiculopathy, lumbar region: Secondary | ICD-10-CM

## 2021-04-24 MED ORDER — HYDROCODONE-ACETAMINOPHEN 10-325 MG PO TABS: 1.0000 | ORAL_TABLET | Freq: Three times a day (TID) | ORAL | 0 refills | Status: DC | PRN

## 2021-04-24 NOTE — Addendum Note (Signed)
Addended by: Silverio Decamp on: 04/24/2021 08:33 AM   Modules accepted: Orders

## 2021-04-27 ENCOUNTER — Other Ambulatory Visit: Payer: Self-pay

## 2021-04-27 ENCOUNTER — Ambulatory Visit
Admission: RE | Admit: 2021-04-27 | Discharge: 2021-04-27 | Disposition: A | Discharge status: Home / Self Care | Payer: Medicare Other | Source: Ambulatory Visit | Attending: Sports Medicine | Admitting: Sports Medicine

## 2021-04-27 DIAGNOSIS — M7138 Other bursal cyst, other site: Secondary | ICD-10-CM

## 2021-04-27 MED ORDER — IOPAMIDOL (ISOVUE-M 200) INJECTION 41%
1.0000 mL | Freq: Once | INTRAMUSCULAR | Status: AC
  Administered 2021-04-27: 1 mL via INTRA_ARTICULAR

## 2021-04-27 MED ORDER — METHYLPREDNISOLONE ACETATE 40 MG/ML INJ SUSP (RADIOLOG
80.0000 mg | Freq: Once | INTRAMUSCULAR | Status: AC
  Administered 2021-04-27: 80 mg via INTRA_ARTICULAR

## 2021-04-27 NOTE — Discharge Instructions (Signed)

## 2021-04-29 ENCOUNTER — Encounter: Payer: Self-pay | Admitting: Physician Assistant

## 2021-05-01 ENCOUNTER — Encounter: Payer: Self-pay | Admitting: Physician Assistant

## 2021-05-01 ENCOUNTER — Other Ambulatory Visit: Payer: Self-pay

## 2021-05-01 ENCOUNTER — Ambulatory Visit (INDEPENDENT_AMBULATORY_CARE_PROVIDER_SITE_OTHER): Payer: Medicare Other | Admitting: Physician Assistant

## 2021-05-01 VITALS — BP 150/81 | HR 66 | Ht 61.0 in | Wt 147.0 lb

## 2021-05-01 DIAGNOSIS — B370 Candidal stomatitis: Secondary | ICD-10-CM

## 2021-05-01 MED ORDER — NYSTATIN 100000 UNIT/ML MT SUSP: 500000.0000 [IU] | Freq: Four times a day (QID) | OROMUCOSAL | 0 refills | Status: DC

## 2021-05-01 NOTE — Patient Instructions (Signed)
Oral Thrush, Adult Oral thrush is an infection in your mouth and throat and on your tongue. It causes white patches to form in your mouth and on your tongue. Many cases of thrush are mild. But, sometimes, thrush can be serious. People who have a weak body defense system (immune system) or other diseases can be affected more. What are the causes? This condition is caused by a type of fungus called yeast. The fungus is normally present in small amounts in the mouth and nose. If a person has a long-term illness or a weak body defense system, the fungus can grow and spread quickly. This causes thrush. What increases the risk? You are more likely to develop this condition if:  You have a weak body defense system.  You are an older adult.  You have diabetes, cancer, or HIV.  You have a dry mouth.  You are pregnant or breastfeeding.  You do not take good care of your teeth. This risk is greater for people who have false teeth (dentures).  You use antibiotic or steroid medicines. What are the signs or symptoms? Symptoms of this condition include:  A burning feeling in the mouth and throat.  White patches that stick to the mouth and tongue.  A bad taste in the mouth or trouble tasting foods.  A feeling like you have cotton in your mouth.  Pain when you eat and swallow.  Not wanting to eat as much as usual.  Cracking at the corners of the mouth.   How is this treated? This condition is treated with medicines called antifungals. These medicines prevent a fungus from growing. The medicines are either put right on the area (topical) or swallowed (oral). Your doctor will also treat other problems that you may have, such as diabetes or HIV. Follow these instructions at home: Medicines  Take or use over-the-counter and prescription medicines only as told by your doctor.  Ask your doctor about an over-the-counter medicine called gentian violet. Helping with pain and soreness To lessen  your pain:  Drink cold liquids, like water and iced tea.  Eat frozen ice pops or frozen juices.  Eat foods that are easy to swallow, like gelatin and ice cream.  Drink from a straw if you have too much pain in your mouth.   General instructions  Eat plain yogurt that has live cultures in it. Read the label to make sure that there are live cultures in your yogurt.  If you wear false teeth: ? Take them out before you go to bed. ? Brush them well. ? Soak them in a cleaner.  Rinse your mouth with warm salt-water many times a day. To make the salt-water mixture, dissolve -1 teaspoon (3-6 g) of salt in 1 cup (237 mL) of warm water. Contact a doctor if:  Your problems do not get better within 7 days of treatment.  Your infection is spreading. This may show as white areas on the skin outside of your mouth.  You are nursing your baby and you have redness and pain in the nipples. Summary  Oral thrush is an infection in your mouth and throat. It is caused by a fungus.  You are more likely to get this condition if you have a weak body defense system. Diseases like diabetes, cancer, or HIV also add to your risk.  This condition is treated with medicines called antifungals.  Contact a doctor if you do not get better within 7 days of starting treatment. This information  is not intended to replace advice given to you by your health care provider. Make sure you discuss any questions you have with your health care provider. Document Revised: 10/08/2019 Document Reviewed: 10/08/2019 Elsevier Patient Education  [employer].

## 2021-05-01 NOTE — Progress Notes (Addendum)
Acute Office Visit  Subjective:    Patient ID: Chelsey Estes, female    DOB: Apr 07, 1941, 80 y.o.   MRN: [id number]  Chief Complaint  Patient presents with  . Rash    HPI Patient is in today for new tongue rash. She had teeth removed one month ago and noticed a soreness and redness on the sides of her tongue a week ago. Two days ago she noticed most of her tongue was white. She tried to scrub it off with a toothbrush which did not improve it. Her tongue is tingly at times. She was on antibiotics for her oral surgery and had a hip injection for sciatica on Friday. She denies any recent oral steroids or use of an inhaler. She has not been diagnosed with any immunocompromised conditions.   Past Medical History:  Diagnosis Date  . Anxiety   . Depression   . Glaucoma   . Hyperlipidemia   . Hypertension   . Kidney stone     Past Surgical History:  Procedure Laterality Date  . ABDOMINAL HYSTERECTOMY    . ANTERIOR (CYSTOCELE) AND POSTERIOR REPAIR (RECTOCELE) WITH XENFORM GRAFT AND SACROSPINOUS FIXATION    . CATARACT EXTRACTION, BILATERAL    . INGUINAL HERNIA REPAIR Left     Family History  Problem Relation Age of Onset  . Alcohol abuse Father   . Cancer Father        esophageal  . Stroke Maternal Grandfather   . Alcohol abuse Paternal Grandfather   . Alzheimer's disease Mother     Social History   Socioeconomic History  . Marital status: Married    Spouse name: Ron  . Number of children: 2  . Years of education: 76  . Highest education level: Some college, no degree  Occupational History  . Occupation: Educational psychologist- Psychologist, prison and probation services    Comment: retired  Tobacco Use  . Smoking status: Never Smoker  . Smokeless tobacco: Never Used  Vaping Use  . Vaping Use: Never used  Substance and Sexual Activity  . Alcohol use: No  . Drug use: No  . Sexual activity: Not Currently  Other Topics Concern  . Not on file  Social History Narrative  . Not on file    Social Determinants of Health   Financial Resource Strain: Not on file  Food Insecurity: Not on file  Transportation Needs: Not on file  Physical Activity: Not on file  Stress: Not on file  Social Connections: Not on file  Intimate Partner Violence: Not on file    Outpatient Medications Prior to Visit  Medication Sig Dispense Refill  . ALPHAGAN P 0.1 % SOLN Place 1 drop into both eyes in the morning and at bedtime.     Marland Kitchen buPROPion (WELLBUTRIN XL) 300 MG 24 hr tablet Take one tablet daily. 90 tablet 3  . carbidopa-levodopa (SINEMET IR) 25-100 MG tablet Take 1 tablet by mouth 3 (three) times daily. 270 tablet 1  . cholecalciferol (VITAMIN D3) 25 MCG (1000 UNIT) tablet Take 1,000 Units by mouth daily.    . cyanocobalamin 1000 MCG tablet Take 2,000 mcg by mouth daily.    . diclofenac sodium (VOLTAREN) 1 % GEL Apply 4 g topically 4 (four) times daily. To affected joint. 100 g 1  . gabapentin (NEURONTIN) 100 MG capsule 100 mg capsule in the morning, 300 mg capsule at night    . HYDROcodone-acetaminophen (NORCO) 10-325 MG tablet Take 1 tablet by mouth 3 (three) times daily as needed. [place]  tablet 0  . lisinopril (ZESTRIL) 5 MG tablet TAKE 1 TABLET(5 MG) BY MOUTH TWICE DAILY/ appt for refills 180 tablet 0  . magnesium 30 MG tablet Take 30 mg by mouth 2 (two) times daily.    . simvastatin (ZOCOR) 10 MG tablet TAKE 1 TABLET(10 MG) BY MOUTH DAILY 90 tablet 3  . Suvorexant (BELSOMRA) 10 MG TABS Take 1 tablet by mouth at bedtime. 90 tablet 1  . timolol (TIMOPTIC) 0.5 % ophthalmic solution Place 1 drop into both eyes daily.     . vitamin k 100 MCG tablet Take 100 mcg by mouth daily.     No facility-administered medications prior to visit.    Allergies  Allergen Reactions  . Trazodone And Nefazodone Nausea Only and Other (See Comments)    Nausea/dizzines/cramps     Review of Systems  HENT: Negative for ear pain, facial swelling, mouth sores, postnasal drip, sore throat and trouble  swallowing.        Objective:    Physical Exam Constitutional:      Appearance: Normal appearance.  HENT:     Mouth/Throat:     Mouth: Mucous membranes are moist.     Pharynx: No oropharyngeal exudate or posterior oropharyngeal erythema.     Comments: White coating across tongue Neurological:     Mental Status: She is alert.     BP (!) 150/81   Pulse 66   Ht _0  (1.549 m)   Wt 147 lb (66.7 kg)   SpO2 99%   BMI 27.78 kg/m  Wt Readings from Last 3 Encounters:  05/01/21 147 lb (66.7 kg)  04/09/21 151 lb (68.5 kg)  11/07/20 150 lb (68 kg)    There are no preventive care reminders to display for this patient.  There are no preventive care reminders to display for this patient.   Lab Results  Component Value Date   TSH 1.010 09/05/2020   Lab Results  Component Value Date   WBC 8.5 09/05/2020   HGB 14.5 09/05/2020   HCT 44.2 09/05/2020   MCV 92 09/05/2020   PLT 293 09/05/2020   Lab Results  Component Value Date   NA 140 04/09/2021   K 4.4 04/09/2021   CO2 21 04/09/2021   GLUCOSE 90 04/09/2021   BUN 25 04/09/2021   CREATININE 1.18 (H) 04/09/2021   BILITOT <0.2 04/09/2021   ALKPHOS 66 04/09/2021   AST 13 04/09/2021   ALT 7 04/09/2021   PROT 6.5 04/09/2021   ALBUMIN 4.2 04/09/2021   CALCIUM 10.1 04/09/2021   EGFR 47 (L) 04/09/2021   Lab Results  Component Value Date   CHOL 172 12/02/2019   Lab Results  Component Value Date   HDL 50 12/02/2019   Lab Results  Component Value Date   LDLCALC 99 12/02/2019   Lab Results  Component Value Date   TRIG 136 12/02/2019   Lab Results  Component Value Date   CHOLHDL 3.4 12/02/2019   No results found for: HGBA1C     Assessment & Plan:   Marland KitchenMarland KitchenLasondra was seen today for rash.  Diagnoses and all orders for this visit:  Thrush -     nystatin (MYCOSTATIN) 100000 UNIT/ML suspension; Take 5 mLs (500,000 Units total) by mouth 4 (four) times daily. Swish for 30 seconds and spit out.   Patient has white  coating over tongue and complains of tingling  She was on an antibiotics and had steroid injection  Nystatin given for thrush   .Camie Patience  Kare Dado PA-C, have reviewed and agree with the above documentation in it's entirety.

## 2021-05-02 ENCOUNTER — Encounter: Payer: Self-pay | Admitting: Physician Assistant

## 2021-05-08 DIAGNOSIS — M545 Low back pain, unspecified: Secondary | ICD-10-CM | POA: Diagnosis not present

## 2021-05-08 MED ORDER — FLUCONAZOLE 150 MG PO TABS: 150.0000 mg | ORAL_TABLET | Freq: Once | ORAL | 0 refills | Status: AC

## 2021-05-11 DIAGNOSIS — M545 Low back pain, unspecified: Secondary | ICD-10-CM | POA: Diagnosis not present

## 2021-05-21 ENCOUNTER — Other Ambulatory Visit: Payer: Self-pay

## 2021-05-21 ENCOUNTER — Ambulatory Visit (INDEPENDENT_AMBULATORY_CARE_PROVIDER_SITE_OTHER): Payer: Medicare Other | Admitting: Sports Medicine

## 2021-05-21 DIAGNOSIS — M5416 Radiculopathy, lumbar region: Secondary | ICD-10-CM | POA: Diagnosis not present

## 2021-05-21 NOTE — Assessment & Plan Note (Signed)
Chelsey Estes returns, she is a very pleasant 80 year old female, we obtained an MRI due to her right L5 distribution radiculopathy. 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

## 2021-05-21 NOTE — Progress Notes (Signed)
    Procedures performed today:    None.  Independent interpretation of notes and tests performed by another provider:   None.  Brief History, Exam, Impression, and Recommendations:    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 Chelsey Estes returns, she is a very pleasant 80 year old female, we obtained an MRI due to her right L5 distribution radiculopathy. 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    ___________________________________________ Gwen Her. Dianah Field, M.D., ABFM., CAQSM. Primary Care and [place] Instructor of [place] of [facility]

## 2021-05-30 ENCOUNTER — Other Ambulatory Visit: Payer: Self-pay | Admitting: Physician Assistant

## 2021-05-30 DIAGNOSIS — I10 Essential (primary) hypertension: Secondary | ICD-10-CM

## 2021-05-31 ENCOUNTER — Other Ambulatory Visit: Payer: Self-pay | Admitting: Neurology

## 2021-05-31 NOTE — Telephone Encounter (Signed)
Received request for refill of Meloxicam. Not on current medication list. Looks like last written 12/01/2020 #90 no refills Last appt 04/09/2021

## 2021-06-01 ENCOUNTER — Emergency Department (INDEPENDENT_AMBULATORY_CARE_PROVIDER_SITE_OTHER): Payer: Medicare Other

## 2021-06-01 ENCOUNTER — Other Ambulatory Visit: Payer: Self-pay

## 2021-06-01 ENCOUNTER — Emergency Department (INDEPENDENT_AMBULATORY_CARE_PROVIDER_SITE_OTHER)
Admission: EM | Admit: 2021-06-01 | Discharge: 2021-06-01 | Disposition: A | Discharge status: Home / Self Care | Payer: Medicare Other | Source: Home / Self Care | Attending: Family Medicine | Admitting: Family Medicine

## 2021-06-01 ENCOUNTER — Encounter: Payer: Self-pay | Admitting: Emergency Medicine

## 2021-06-01 ENCOUNTER — Telehealth: Payer: Self-pay | Admitting: Neurology

## 2021-06-01 DIAGNOSIS — M7742 Metatarsalgia, left foot: Secondary | ICD-10-CM

## 2021-06-01 DIAGNOSIS — S93402A Sprain of unspecified ligament of left ankle, initial encounter: Secondary | ICD-10-CM | POA: Diagnosis not present

## 2021-06-01 DIAGNOSIS — M7732 Calcaneal spur, left foot: Secondary | ICD-10-CM | POA: Diagnosis not present

## 2021-06-01 DIAGNOSIS — M25572 Pain in left ankle and joints of left foot: Secondary | ICD-10-CM | POA: Diagnosis not present

## 2021-06-01 DIAGNOSIS — W19XXXA Unspecified fall, initial encounter: Secondary | ICD-10-CM

## 2021-06-01 DIAGNOSIS — M79672 Pain in left foot: Secondary | ICD-10-CM | POA: Diagnosis not present

## 2021-06-01 NOTE — Telephone Encounter (Signed)
Cologuard order faxed to [phone] with confirmation received. They will contact the patient directly.   

## 2021-06-01 NOTE — ED Provider Notes (Signed)
[facility]    CSN: [id number] Arrival date & time: 06/01/21  1149      History   Chief Complaint Chief Complaint  Patient presents with   Foot Pain    HPI Chelsey Estes is a 80 y.o. female.   Patient states that tripped last night and injured her left foot/ankle.  She denies other injury. She has had persist swelling and pain with weight bearing.  The history is provided by the patient and a relative.  Ankle Pain Location:  Ankle Time since incident:  1 day Injury: yes   Mechanism of injury: fall   Ankle location:  L ankle Pain details:    Quality:  Aching   Radiates to:  Does not radiate   Severity:  Moderate   Onset quality:  Sudden   Duration:  1 day   Timing:  Constant   Progression:  Unchanged Chronicity:  New Relieved by:  Nothing Worsened by:  Bearing weight Ineffective treatments:  None tried Associated symptoms: decreased ROM, stiffness and swelling   Associated symptoms: no fatigue, no fever, no muscle weakness, no numbness and no tingling    Past Medical History:  Diagnosis Date   Anxiety    Depression    Glaucoma    Hyperlipidemia    Hypertension    Kidney stone     Patient Active Problem List   Diagnosis Date Noted   DDD (degenerative disc disease), lumbar 04/11/2021   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 03/09/2021   Palpitations 09/05/2020   SOB (shortness of breath) 09/05/2020   Chest pain 09/05/2020   Mixed hyperlipidemia 06/06/2020   Primary insomnia 06/06/2020   Primary parkinsonism ([place]) 10/26/2019   Osteoporosis 09/15/2019   Blister of great toe of left foot 08/19/2019   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   Moderate episode of recurrent major depressive disorder ([place]) 02/09/2019   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 02/09/2019   Shuffling gait 02/09/2019   Balance problems 02/09/2019   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   Orthostatic hypotension 02/01/2019   Dizziness 02/01/2019    Primary osteoarthritis of right foot 09/25/2018   Snoring 09/25/2018   Night terrors, adult 09/25/2018   Multiple falls 09/25/2018   Metatarsalgia of left foot 08/20/2018   Chronic foot pain, right 08/20/2018   Left hip pain 04/10/2017   History of shingles 04/08/2017   Adenomatous polyp 04/08/2017   Pure hypercholesterolemia 04/08/2017   History of kidney stones 04/08/2017   Essential hypertension 04/08/2017   Depression, recurrent ([place]) 04/08/2017   Anxiety 04/08/2017   Greater trochanteric bursitis of left hip 04/08/2017    Past Surgical History:  Procedure Laterality Date   ABDOMINAL HYSTERECTOMY     ANTERIOR (CYSTOCELE) AND POSTERIOR REPAIR (RECTOCELE) WITH XENFORM GRAFT AND SACROSPINOUS FIXATION     CATARACT EXTRACTION, BILATERAL     INGUINAL HERNIA REPAIR Left     OB History   No obstetric history on file.      Home Medications    Prior to Admission medications   Medication Sig Start Date End Date Taking? Authorizing Provider  ALPHAGAN P 0.1 % SOLN Place 1 drop into both eyes in the morning and at bedtime.  02/10/19  Yes [provider]  buPROPion (WELLBUTRIN XL) 300 MG 24 hr tablet Take one tablet daily. 09/05/20  Yes Breeback, Jade L, PA-C  carbidopa-levodopa (SINEMET IR) 25-100 MG tablet Take 1 tablet by mouth 3 (three) times daily. 11/07/20  Yes Tat, Eustace Quail, DO  cholecalciferol (VITAMIN  D3) 25 MCG (1000 UNIT) tablet Take 1,000 Units by mouth daily.   Yes [provider]  cyanocobalamin 1000 MCG tablet Take 2,000 mcg by mouth daily.   Yes [provider]  lisinopril (ZESTRIL) 5 MG tablet TAKE ONE TABLET BY MOUTH TWICE DAILY 05/30/21  Yes Breeback, Jade L, PA-C  nystatin (MYCOSTATIN) 100000 UNIT/ML suspension Take 5 mLs (500,000 Units total) by mouth 4 (four) times daily. Swish for 30 seconds and spit out. 05/01/21  Yes Breeback, Jade L, PA-C  simvastatin (ZOCOR) 10 MG tablet TAKE 1 TABLET(10 MG) BY MOUTH DAILY 09/05/20  Yes Breeback, Jade  L, PA-C  Suvorexant (BELSOMRA) 10 MG TABS Take 1 tablet by mouth at bedtime. 12/19/20  Yes Breeback, Jade L, PA-C  timolol (TIMOPTIC) 0.5 % ophthalmic solution Place 1 drop into both eyes daily.  02/10/19  Yes [provider]  vitamin k 100 MCG tablet Take 100 mcg by mouth daily.   Yes [provider]  diclofenac sodium (VOLTAREN) 1 % GEL Apply 4 g topically 4 (four) times daily. To affected joint. 03/15/19   Donella Stade, PA-C  gabapentin (NEURONTIN) 100 MG capsule 100 mg capsule in the morning, 300 mg capsule at night 04/20/21   Silverio Decamp, MD    Family History Family History  Problem Relation Age of Onset   Alcohol abuse Father    Cancer Father        esophageal   Stroke Maternal Grandfather    Alcohol abuse Paternal Grandfather    Alzheimer's disease Mother     Social History Social History   Tobacco Use   Smoking status: Never   Smokeless tobacco: Never  Vaping Use   Vaping Use: Never used  Substance Use Topics   Alcohol use: No   Drug use: No     Allergies   Trazodone and nefazodone   Review of Systems Review of Systems  Constitutional:  Positive for activity change. Negative for chills, diaphoresis, fatigue and fever.  Musculoskeletal:  Positive for joint swelling and stiffness.  Skin:  Negative for color change and wound.  All other systems reviewed and are negative.   Physical Exam Triage Vital Signs ED Triage Vitals  Enc Vitals Group     BP 06/01/21 1251 118/81     Pulse Rate 06/01/21 1251 86     Resp --      Temp --      Temp src --      SpO2 06/01/21 1251 99 %     Weight --      Height --      Head Circumference --      Peak Flow --      Pain Score 06/01/21 1253 5     Pain Loc --      Pain Edu? --      Excl. in [place]? --    No data found.  Updated Vital Signs BP 118/81 (BP Location: Right Arm)   Pulse 86   SpO2 99%   Visual Acuity Right Eye Distance:   Left Eye Distance:   Bilateral Distance:    Right Eye  Near:   Left Eye Near:    Bilateral Near:     Physical Exam Vitals and nursing note reviewed.  Constitutional:      General: She is not in acute distress. HENT:     Head: Atraumatic.  Eyes:     Extraocular Movements: Extraocular movements intact.     Pupils: Pupils are  equal, round, and reactive to light.  Cardiovascular:     Rate and Rhythm: Normal rate.  Pulmonary:     Effort: Pulmonary effort is normal.  Musculoskeletal:        General: Swelling present.     Left ankle: Swelling present. No deformity, ecchymosis or lacerations. Tenderness present over the lateral malleolus. Decreased range of motion.     Left Achilles Tendon: Normal.       Feet:     Comments: Left ankle:  Decreased range of motion.  Tenderness and swelling over the lateral malleolus.  Joint stable.  No tenderness over the base of the fifth metatarsal.  Distal neurovascular function is intact.   Skin:    General: Skin is warm and dry.     Capillary Refill: Capillary refill takes less than 2 seconds.  Neurological:     Mental Status: She is alert and oriented to person, place, and time.     UC Treatments / Results  Labs (all labs ordered are listed, but only abnormal results are displayed) Labs Reviewed - No data to display  EKG   Radiology DG Ankle Complete Left  Result Date: 06/01/2021 CLINICAL DATA:  Pain following fall EXAM: LEFT ANKLE COMPLETE - 3+ VIEW COMPARISON:  None. FINDINGS: Frontal, oblique, and lateral views were obtained. No fracture or joint effusion. Joint spaces appear normal. No erosive change. Ankle mortise appears intact. Evidence of previous hallux valgus repair of the first MTP joint. IMPRESSION: No evident fracture or arthropathy in the ankle region. Ankle mortise appears intact. Electronically Signed   By: Lowella Grip III M.D.   On: 06/01/2021 14:08   DG Foot Complete Left  Result Date: 06/01/2021 CLINICAL DATA:  Pain following going fall EXAM: LEFT FOOT - COMPLETE 3+  VIEW COMPARISON:  August 19, 2019 FINDINGS: Frontal, oblique and lateral views obtained. There is a screw in the distal first metatarsal with evidence of previous hallux valgus deformity repair. There is no acute fracture or dislocation. There is moderate narrowing of the first MTP joint. Mild narrowing also noted in all PIP and D IP joints. Apparent prior trauma with remodeling lateral aspect of the distal fifth metatarsal, stable. Apparent hammertoe type deformities second digit, stable. Small calcaneal spurs noted. IMPRESSION: Status post hallux valgus deformity repair. Moderate narrowing first MTP joint. Milder narrowing all PIP and D IP joints. Apparent prior trauma along the lateral aspect of the distal fifth metatarsal, stable. Small calcaneal spurs. No acute fracture or dislocation. Apparent hammertoe type defect second digit. Electronically Signed   By: Lowella Grip III M.D.   On: 06/01/2021 13:35    Procedures Procedures (including critical care time)  Medications Ordered in UC Medications - No data to display  Initial Impression / Assessment and Plan / UC Course  I have reviewed the triage vital signs and the nursing notes.  Pertinent labs & imaging results that were available during my care of the patient were reviewed by me and considered in my medical decision making (see chart for details).    Ace wrap and cam walker applied. Followup with Dr. Aundria Mems ([facility]) if not improving about two weeks.   Final Clinical Impressions(s) / UC Diagnoses   Final diagnoses:  Metatarsalgia of left foot  Sprain of left ankle, unspecified ligament, initial encounter     Discharge Instructions      Apply ice pack for 30 minutes every 1 to 2 hours today and tomorrow.  Elevate.    Wear  Ace wrap until swelling decreases.  Wear cam walker boot for about 2 to 3 weeks.  Use walker as needed.  Begin range of motion and stretching exercises in about 5 days as per  instruction sheet.  May take Tylenol if needed for pain.      ED Prescriptions   None       Kandra Nicolas, MD 06/03/21 [phone]

## 2021-06-01 NOTE — Discharge Instructions (Addendum)
Apply ice pack for 30 minutes every 1 to 2 hours today and tomorrow.  Elevate.    Wear Ace wrap until swelling decreases.  Wear cam walker boot for about 2 to 3 weeks.  Use walker as needed.  Begin range of motion and stretching exercises in about 5 days as per instruction sheet.  May take Tylenol if needed for pain.

## 2021-06-01 NOTE — ED Triage Notes (Signed)
Patient states that she fell last night and injured her left foot.  Patient is having some swelling and bruising.  She did take a hydrocodone that she had leftover.

## 2021-06-08 ENCOUNTER — Other Ambulatory Visit: Payer: Self-pay

## 2021-06-21 DIAGNOSIS — N200 Calculus of kidney: Secondary | ICD-10-CM | POA: Diagnosis not present

## 2021-06-21 DIAGNOSIS — Z87442 Personal history of urinary calculi: Secondary | ICD-10-CM | POA: Diagnosis not present

## 2021-07-02 ENCOUNTER — Other Ambulatory Visit: Payer: Self-pay | Admitting: Neurology

## 2021-07-02 NOTE — Telephone Encounter (Signed)
Pt called in needing refill of her Carbidopa-levodopa sent to [facility]

## 2021-07-03 ENCOUNTER — Other Ambulatory Visit: Payer: Self-pay

## 2021-07-03 NOTE — Telephone Encounter (Signed)
Patient requesting refill on Carbidopa Levodopa. Last appt 10/2020. Has f/u scheduled.

## 2021-07-04 NOTE — Telephone Encounter (Signed)
Pt is still waiting on RX at [facility]. Please let them know when it is sent over

## 2021-07-05 DIAGNOSIS — Z961 Presence of intraocular lens: Secondary | ICD-10-CM | POA: Diagnosis not present

## 2021-07-05 DIAGNOSIS — H52223 Regular astigmatism, bilateral: Secondary | ICD-10-CM | POA: Diagnosis not present

## 2021-07-05 DIAGNOSIS — H04121 Dry eye syndrome of right lacrimal gland: Secondary | ICD-10-CM | POA: Diagnosis not present

## 2021-07-05 DIAGNOSIS — H401121 Primary open-angle glaucoma, left eye, mild stage: Secondary | ICD-10-CM | POA: Diagnosis not present

## 2021-07-05 DIAGNOSIS — H401112 Primary open-angle glaucoma, right eye, moderate stage: Secondary | ICD-10-CM | POA: Diagnosis not present

## 2021-07-05 MED ORDER — CARBIDOPA-LEVODOPA 25-100 MG PO TABS: 1.0000 | ORAL_TABLET | Freq: Three times a day (TID) | ORAL | 0 refills | Status: DC

## 2021-08-15 IMAGING — DX DG FOOT COMPLETE 3+V*L*
3 series · 3 of 3 positions shown · non-contrast
Comparison: August 19, 2019

CLINICAL DATA: Pain following going fall

EXAM:
LEFT FOOT - COMPLETE 3+ VIEW

[foot ap]
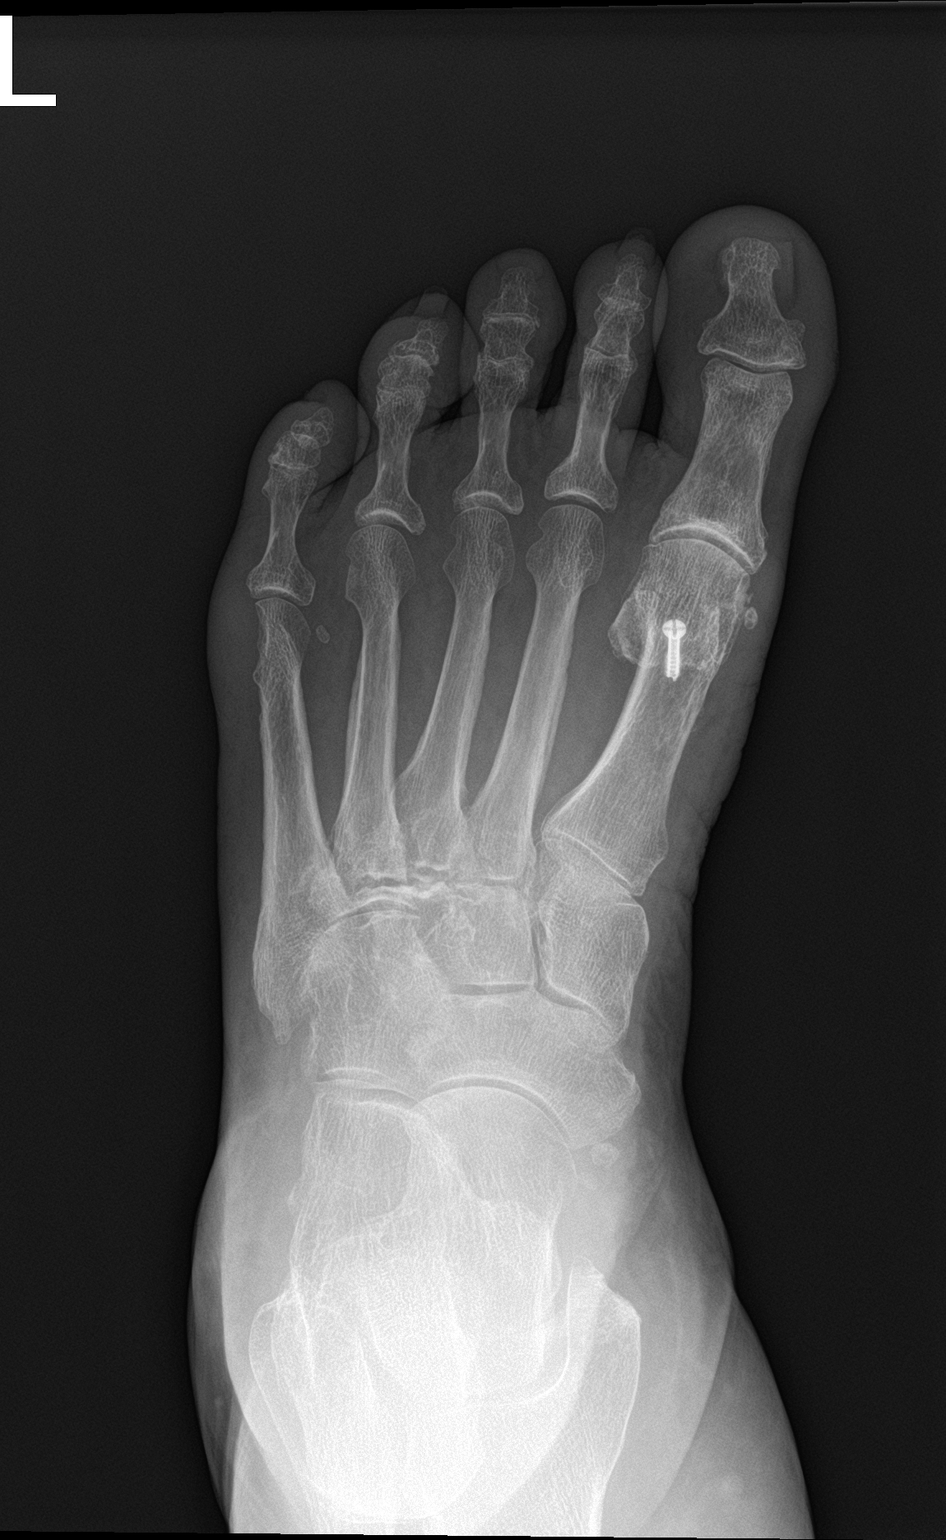

[foot obl]
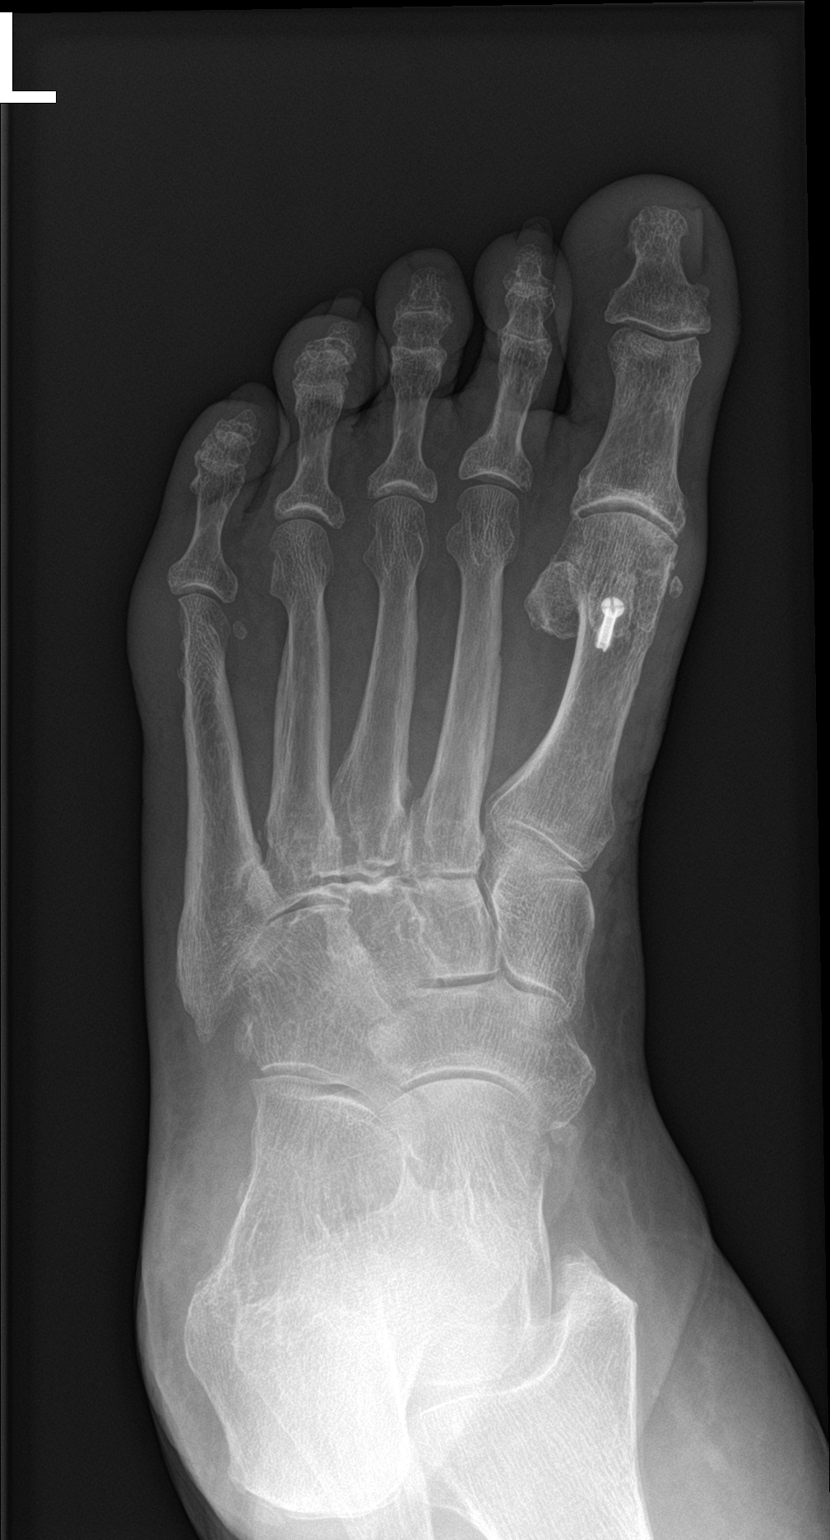

[foot lat]
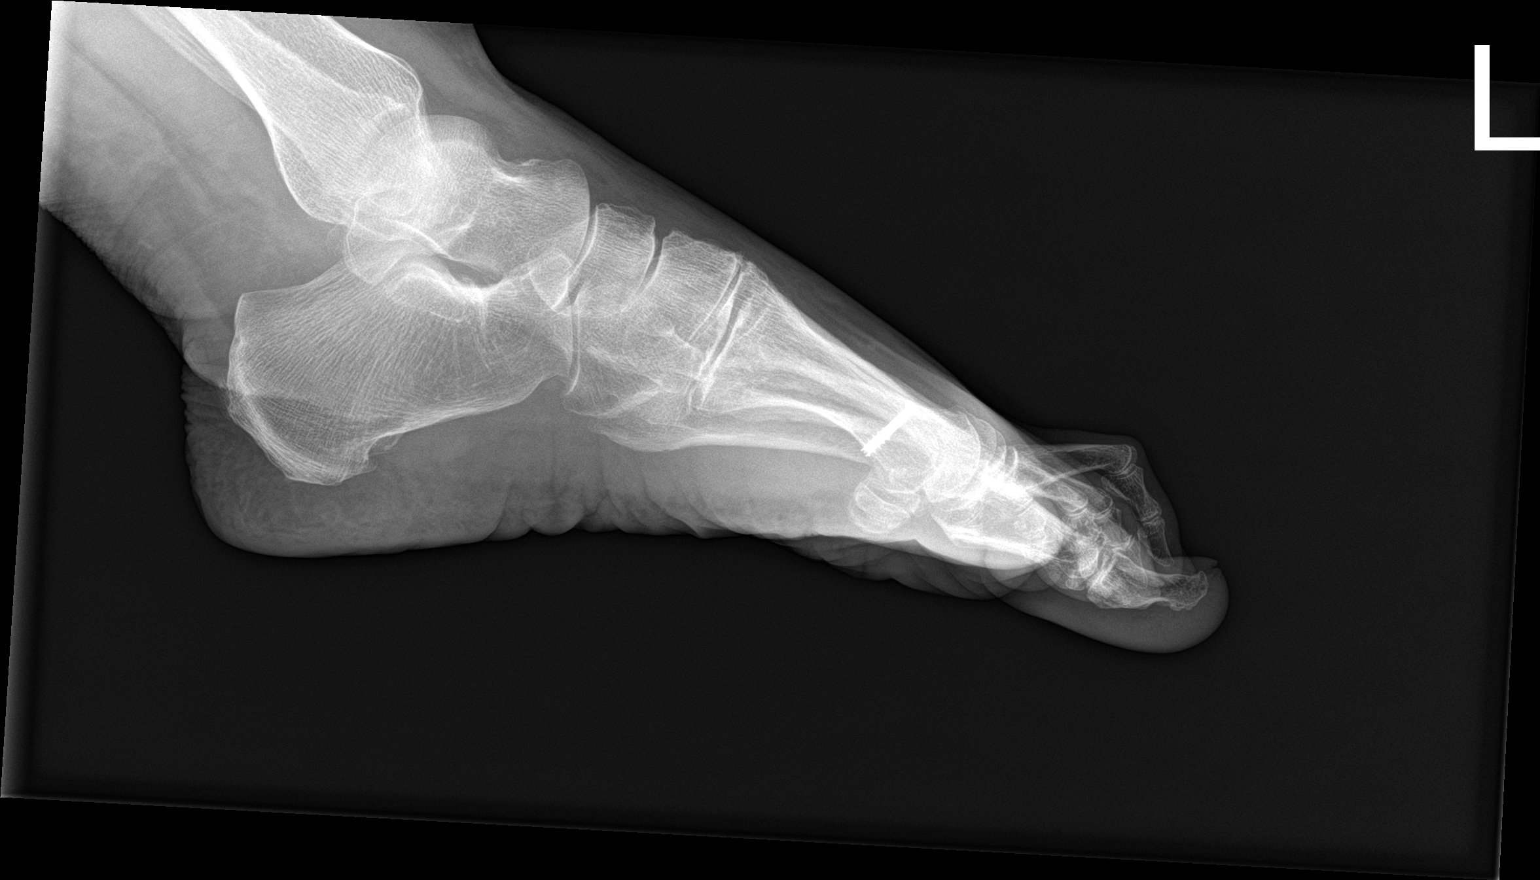

[3 of 3 positions shown; findings below may reference images not displayed]

FINDINGS: Frontal, oblique and lateral views obtained. There is a screw in the
distal first metatarsal with evidence of previous hallux valgus
deformity repair. There is no acute fracture or dislocation. There
is moderate narrowing of the first MTP joint. Mild narrowing also
noted in all PIP and D IP joints. Apparent prior trauma with
remodeling lateral aspect of the distal fifth metatarsal, stable.
Apparent hammertoe type deformities second digit, stable. Small
calcaneal spurs noted.
IMPRESSION: Status post hallux valgus deformity repair. Moderate narrowing first
MTP joint. Milder narrowing all PIP and D IP joints. Apparent prior
trauma along the lateral aspect of the distal fifth metatarsal,
stable. Small calcaneal spurs. No acute fracture or dislocation.
Apparent hammertoe type defect second digit.

## 2021-08-28 ENCOUNTER — Other Ambulatory Visit: Payer: Self-pay | Admitting: Physician Assistant

## 2021-08-28 DIAGNOSIS — E782 Mixed hyperlipidemia: Secondary | ICD-10-CM

## 2021-09-04 DIAGNOSIS — Z23 Encounter for immunization: Secondary | ICD-10-CM | POA: Diagnosis not present

## 2021-09-10 ENCOUNTER — Other Ambulatory Visit: Payer: Self-pay | Admitting: Neurology

## 2021-09-10 DIAGNOSIS — F419 Anxiety disorder, unspecified: Secondary | ICD-10-CM

## 2021-09-10 DIAGNOSIS — F339 Major depressive disorder, recurrent, unspecified: Secondary | ICD-10-CM

## 2021-09-10 MED ORDER — BUPROPION HCL ER (XL) 300 MG PO TB24: ORAL_TABLET | ORAL | 1 refills | Status: DC

## 2021-09-11 NOTE — Progress Notes (Signed)
Assessment/Plan:   1.  Parkinsons Disease  -Likely idiopathic, but did have diplopia early on, so atypical state has been considered but haven't seen this progress as I would except for that.  This has been complicated by the fact that the patient has had multiple eye surgeries.  -Continue carbidopa/levodopa 25/100, 1 tablet 3 times per day.  -discussed PT.  She would like to do this.  Will send referral to med center [facility]  2.  Diplopia  -Follows with ophthalmology.  Long history of eye surgeries, as above.  3.  GAD/depression  -Mostly related to family situation.  In counseling.  Husband alcoholic, which obviously stressful and she is primary caregiver and he is falling and has failing health  4.  Parkinsons Disease dyskinesia  -mild.  She doesn't notice it much and no med needed  5.  Low back pain with sacral cyst  -some improvement after she had cyst drained (per patient)   Subjective:   Chelsey Estes was seen today in follow up for Parkinsons disease.  My previous records were reviewed prior to todays visit as well as outside records available to me.  Its been nearly a year since I have seen the patient.  She had an April appointment but ended up canceling that.  She did go to her primary care in April and complained that symptoms were getting worse, however.  They discussed limiting hydrocodone, and increasing the gabapentin.  She was in the emergency room in June, 2022 after tripping and injuring her left foot/ankle.  She did not fall.  She states that she has probably had 5 falls since our last visit.  With one, she slipped and fell where dog peed.  With 3 of the falls, she got up and foot was numb and toes curled.  She was dx with a cyst on her sciatic nerve.  All of the falls were close together.  No falls in 2 months.    Current prescribed movement disorder medications:  Carbidopa/levodopa 25/100, 1 tablet 3 times per day.   PREVIOUS MEDICATIONS: Sinemet  ALLERGIES:    Allergies  Allergen Reactions   Trazodone And Nefazodone Nausea Only and Other (See Comments)    Nausea/dizzines/cramps     CURRENT MEDICATIONS:  Outpatient Encounter Medications as of 09/13/2021  Medication Sig   buPROPion (WELLBUTRIN XL) 300 MG 24 hr tablet Take one tablet daily.   carbidopa-levodopa (SINEMET IR) 25-100 MG tablet Take 1 tablet by mouth 3 (three) times daily.   cholecalciferol (VITAMIN D3) 25 MCG (1000 UNIT) tablet Take 1,000 Units by mouth daily.   cyanocobalamin 1000 MCG tablet Take 2,000 mcg by mouth daily.   lisinopril (ZESTRIL) 5 MG tablet TAKE ONE TABLET BY MOUTH TWICE DAILY   nystatin (MYCOSTATIN) 100000 UNIT/ML suspension Take 5 mLs (500,000 Units total) by mouth 4 (four) times daily. Swish for 30 seconds and spit out.   simvastatin (ZOCOR) 10 MG tablet TAKE 1 TABLET(10 MG) BY MOUTH DAILY/ needs labs   Suvorexant (BELSOMRA) 10 MG TABS Take 1 tablet by mouth at bedtime.   timolol (TIMOPTIC) 0.5 % ophthalmic solution Place 1 drop into both eyes daily.    vitamin k 100 MCG tablet Take 100 mcg by mouth daily.   ALPHAGAN P 0.1 % SOLN Place 1 drop into both eyes in the morning and at bedtime.    [DISCONTINUED] diclofenac sodium (VOLTAREN) 1 % GEL Apply 4 g topically 4 (four) times daily. To affected joint. (Patient not taking: Reported on  09/13/2021)   [DISCONTINUED] gabapentin (NEURONTIN) 100 MG capsule 100 mg capsule in the morning, 300 mg capsule at night (Patient not taking: Reported on 09/13/2021)   No facility-administered encounter medications on file as of 09/13/2021.    Objective:   PHYSICAL EXAMINATION:    VITALS:   Vitals:   09/13/21 0959  BP: 135/62  Pulse: 82  SpO2: 97%  Weight: 143 lb 12.8 oz (65.2 kg)  Height: 5\' 1"  (1.549 m)     GEN:  The patient appears stated age and is in NAD. HEENT:  Normocephalic, atraumatic.  The mucous membranes are moist. The superficial temporal arteries are without ropiness or tenderness. CV:  RRR Lungs:   CTAB Neck/HEME:  There are no carotid bruits bilaterally.  Neurological examination:  Orientation: The patient is alert and oriented x3. Cranial nerves: There is good facial symmetry with mild facial hypomimia. The speech is fluent and clear. Soft palate rises symmetrically and there is no tongue deviation. Hearing is intact to conversational tone. Sensation: Sensation is intact to light touch throughout Motor: Strength is at least antigravity x4.  Movement examination: Tone: There is normal tone in the UE/LE Abnormal movements: there is dyskinesia in the RLE, overall mild Coordination:  There is no decremation with RAM's, with any form of RAMS, including alternating supination and pronation of the forearm, hand opening and closing, finger taps, heel taps and toe taps. Gait and Station: The patient has no difficulty arising out of a deep-seated chair without the use of the hands. The patient drags the R leg with ambulation a bit.  I have reviewed and interpreted the following labs independently    Chemistry      Component Value Date/Time   NA 140 04/09/2021 1626   K 4.4 04/09/2021 1626   CL 104 04/09/2021 1626   CO2 21 04/09/2021 1626   BUN 25 04/09/2021 1626   CREATININE 1.18 (H) 04/09/2021 1626   CREATININE 0.96 (H) 06/08/2020 1036   GLU 109 09/25/2016 0000      Component Value Date/Time   CALCIUM 10.1 04/09/2021 1626   ALKPHOS 66 04/09/2021 1626   AST 13 04/09/2021 1626   ALT 7 04/09/2021 1626   BILITOT <0.2 04/09/2021 1626       Lab Results  Component Value Date   WBC 8.5 09/05/2020   HGB 14.5 09/05/2020   HCT 44.2 09/05/2020   MCV 92 09/05/2020   PLT 293 09/05/2020    Lab Results  Component Value Date   TSH 1.010 09/05/2020     Total time spent on today's visit was 20 minutes, including both face-to-face time and nonface-to-face time.  Time included that spent on review of records (prior notes available to me/labs/imaging if pertinent), discussing treatment  and goals, answering patient's questions and coordinating care.  Cc:  Donella Stade, PA-C

## 2021-09-13 ENCOUNTER — Other Ambulatory Visit: Payer: Self-pay

## 2021-09-13 ENCOUNTER — Encounter: Payer: Self-pay | Admitting: Neurology

## 2021-09-13 ENCOUNTER — Ambulatory Visit (INDEPENDENT_AMBULATORY_CARE_PROVIDER_SITE_OTHER): Payer: Medicare Other | Admitting: Neurology

## 2021-09-13 VITALS — BP 135/62 | HR 82 | Ht 61.0 in | Wt 143.8 lb

## 2021-09-13 DIAGNOSIS — G249 Dystonia, unspecified: Secondary | ICD-10-CM

## 2021-09-13 DIAGNOSIS — G20 Parkinson's disease: Secondary | ICD-10-CM

## 2021-09-13 MED ORDER — CARBIDOPA-LEVODOPA 25-100 MG PO TABS: 1.0000 | ORAL_TABLET | Freq: Three times a day (TID) | ORAL | 1 refills | Status: DC

## 2021-09-13 NOTE — Patient Instructions (Signed)
Online Resources for Power over Parkinson's Group September 2022  Local [place] Online Groups  Power over [employer] Group :   Power Over Parkinson's Patient Education Group will be Wednesday, September 14th-*Hybrid meting*- in person at [facility] location and via [facility] at 2:00 pm.   Upcoming Power over [employer] Meetings:  2nd Wednesdays of the month at 2 pm:  August 10th, September 14th [place] at [email or web address] if interested in participating in this online group Parkinson's Care Partners Group:    3rd Mondays, Contact Misty Paladino Atypical Parkinsonian Patient Group:   4th Wednesdays, Contact Misty Paladino If you are interested in participating in these online groups with Misty, please contact her directly for how to join those meetings.  Her contact information is [email or web address].   [place]:  [email or web address] PD Health at Home continues:  Mindfulness Mondays, Expert Briefing Tuesdays, Wellness Wednesdays, Take Time Thursdays, Fitness Fridays -Listings for June 2022 are on the website Upcoming Webinar:  Use it or Lose It-The Impact of Physical Activity in Parkinson's.  Wednesday, September 7th at 1 pm. Upcoming Webinar:  Understanding Gene and Cell-Based Therapies in Parkinson's.  Wednesday, October 5th at 1 pm Register for expert briefings (webinars) at [email or web address]  Please check out their website to sign up for emails and see their full online offerings  [place]:  [email or web address]  Upcoming Webinar:   Finding your Way:  Working through Emotions in the Early Years with Parkinson's.  Thursday, September 15th at 12 noon Check out additional information on their website to see their full online offerings  University:  [email or web address] Upcoming Webinar:  Pelvic Health and Living with  Parkinson's.  Tuesday, September 20th at 3 pm.  Care Partner Monthly Meetup.  With Robin Searing Phinney.  First Tuesday of each month, 2 pm Joy Breaks:  First Wednesday of each month, 2-3 pm. There will be art, doodling, making, crafting, listening, laughing, stories, and everything in between. No art experience necessary. No supplies required. Just show up for joy!  Register on their website. Check out additional information to Live Well Today on their website  Parkinson and Movement Disorders (PMD) Alliance:  www.pmdalliance.org NeuroLife Online:  Online Education Events Sign up for emails, which are sent weekly to give you updates on programming and online offerings  Parkinson's Association of the [place]:  www.[email or web address] Caring for Parkinson's, Caring for You Symposium, Saturday, September 10th.  [place], [place].  Symposium Registration - Parkinson Association of the [place] Information on online support groups, education events, and online exercises including Yoga, Parkinson's exercises and more-LOTS of information on links to PD resources and online events Virtual Support Group through Parkinson's Association of the [place]; next one is scheduled for Wednesday, October 5th at 2 pm. No September support group due to symposium.  (These are typically scheduled for the 1st Wednesday of the month at 2 pm).  Visit website for details.  Additional links for movement activities: Parkinson's DRUMMING Classes/Music Therapy with Doylene Canning:  This is a returning class!  2nd Mondays, beginning September 12th.  Contact Doylene Canning at [phone] or [email or web address] PWR! Moves Classes at [place] RESUMED!  Wednesdays 10 and 11 am.  Contact Amy Marriott, PT [email or web address] or [phone] if interested Here is a link to the PWR!Moves classes on Zoom from [place] - Daily Mon-Sat at 10:00. Via Zoom, FREE and open  to all.  There is also a link below via Facebook if you use that platform. [email or web address]  [email or web address] Parkinson's Wellness Recovery (PWR! Moves)  www.[email or web address] Info on the PWR! Virtual Experience:  You will have access to our expertise through self-assessment, guided plans that start with the PD-specific fundamentals, educational content, tips, Q&A with an expert, and a growing Art therapist of PD-specific pre-recorded and live exercise classes of varying types and intensity - both physical and cognitive! If that is not enough, we offer 1:1 wellness consultations (in-person or virtual) to personalize your PWR! Research scientist (medical).  [place] Fridays:  As part of the PD Health @ Home program, this free video series focuses each week on one aspect of fitness designed to support people living with Parkinson's.  These weekly videos highlight the [place] recent fitness guidelines for people with Parkinson's disease.  HollywoodSale.dk Dance for PD website is offering free, live-stream classes throughout the week, as well as links to [employer] of classes:  [email or web address]/ Dance for Parkinson's Class:  [place].  Free offering for people with Parkinson's and care partners; virtual class.  For more information, contact [phone] or email Ruffin Frederick at [email or web address] Virtual dance and Pilates for Parkinson's classes: Click on the Community Tab> Parkinson's Movement Initiative Tab.  To register for classes and for more information, visit [email or web address] and click the "community" tab.  [employer] Parkinson's Cycling Classes  [employer]: 1pm on Fridays-Live  classes at [employer] ([employer] at [email or web address] or [phone]) [employer]: Virtual Classes Mondays and Thursdays Jeanette Caprice classes Tuesday, Wednesday and Thursday (contact [place] at [email or web address]  or [phone])  [facility] Boxing Three levels of classes are offered Tuesdays and Thursdays:  10:30 am,  12 noon & 1:45 pm at [facility].  Active Stretching with Paula Compton Class starting in March, on Fridays To observe a class or for  more information, call [phone] or email [email or web address] Well-Spring [employer]: Online Caregiver Education Opportunities:  www.well-springsolutions.org/caregiver-education/caregiver-support-group.  You may also contact Vickki Muff at [email or web address] or [phone].   Powerful Tools for Caregivers:  6-week program beginning Thursday, October 13th.  This six-week educational series designed to provide family caregivers with practical tools to care for themselves while caring for a loved one Unlocking Dementia through [facility], Humor, and Understanding:  5-week series beginning September 7th Well-Spring Navigator:  02-[birth year]-2000 program, a free service to help individuals and families through the journey of determining care for older adults.  The "Navigator" is a [employer], Education officer, museum, who will speak with a prospective client and/or loved ones to provide an assessment of the situation and a set of recommendations for a personalized care plan -- all free of charge, and whether Well-Spring [employer] offers the needed service or not. If the need is not a service we provide, we are well-connected with reputable programs in town that we can refer you to.  www.well-springsolutions.org or to speak with the Navigator, call [phone].

## 2021-09-19 ENCOUNTER — Encounter: Payer: Self-pay | Admitting: Physician Assistant

## 2021-09-19 ENCOUNTER — Telehealth (INDEPENDENT_AMBULATORY_CARE_PROVIDER_SITE_OTHER): Payer: Medicare Other | Admitting: Physician Assistant

## 2021-09-19 VITALS — Ht 61.0 in | Wt 143.0 lb

## 2021-09-19 DIAGNOSIS — M7138 Other bursal cyst, other site: Secondary | ICD-10-CM | POA: Insufficient documentation

## 2021-09-19 DIAGNOSIS — M5416 Radiculopathy, lumbar region: Secondary | ICD-10-CM | POA: Diagnosis not present

## 2021-09-19 MED ORDER — HYDROCODONE-ACETAMINOPHEN 10-325 MG PO TABS: 1.0000 | ORAL_TABLET | Freq: Four times a day (QID) | ORAL | 0 refills | Status: DC | PRN

## 2021-09-19 NOTE — Progress Notes (Signed)
..  Virtual Visit via Telephone Note  I connected with Chelsey Estes on 09/19/21 at 11:10 AM EDT by telephone and verified that I am speaking with the correct person using two identifiers.  Location: Patient: home Provider: clinic  .Marland KitchenParticipating in visit:  Patient: Chelsey Estes Provider: Iran Planas PA-C Provider in training: Jari Favre PA-Student   I discussed the limitations, risks, security and privacy concerns of performing an evaluation and management service by telephone and the availability of in person appointments. I also discussed with the patient that there may be a patient responsible charge related to this service. The patient expressed understanding and agreed to proceed.   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  She calls into the clinic and severe pain that is similar to the pain she had with synovial cyst.  She had this cyst aspirated and injected in May 2022.  She got significantly better for a few weeks and then slowly the pain has worsened.  Last week it drastically worsened.  Now she is having tremendous radiation of pain down her right buttocks and into her right calf that makes it hard to walk and she falls easily.  Pain is 9 out of 10.  Patient is requesting pain medicine to get her to another appointment with sports medicine provider Dr. Dianah Field. Denies any bowel or bladder dysfunction, saddle anesthesia.     Observations/Objective: No acute distress Pt is in a lot of pain on her right side from buttocks down right leg and into right calf.    Assessment and Plan:  Marland KitchenMarland KitchenKamera was seen today for back pain.  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  Last injection/aspiratrion 04/2021.  Marland KitchenMarland KitchenPDMP reviewed during this  encounter. No concerns. Last norco was 04/2021.  Sent norco.  Follow up with Dr. Darene Lamer ASAP to discuss next steps.   Follow Up Instructions:    I discussed the assessment and treatment plan with the patient. The patient was provided an opportunity to ask questions and all were answered. The patient agreed with the plan and demonstrated an understanding of the instructions.   The patient was advised to call back or seek an in-person evaluation if the symptoms worsen or if the condition fails to improve as anticipated.  I provided 10 minutes of non-face-to-face time during this encounter.   Iran Planas, PA-C

## 2021-09-19 NOTE — Progress Notes (Deleted)
Low back/buttock pain - down right leg  Had cyst drained near sciatic nerve in back but pain slowly coming back Leg is giving out this am

## 2021-09-25 ENCOUNTER — Ambulatory Visit (INDEPENDENT_AMBULATORY_CARE_PROVIDER_SITE_OTHER): Payer: Medicare Other | Admitting: Sports Medicine

## 2021-09-25 ENCOUNTER — Ambulatory Visit: Payer: Medicare Other | Admitting: Physical Therapy

## 2021-09-25 ENCOUNTER — Other Ambulatory Visit: Payer: Self-pay

## 2021-09-25 DIAGNOSIS — M5416 Radiculopathy, lumbar region: Secondary | ICD-10-CM | POA: Diagnosis not present

## 2021-09-25 MED ORDER — PREDNISONE 50 MG PO TABS: ORAL_TABLET | ORAL | 0 refills | Status: DC

## 2021-09-25 NOTE — Assessment & Plan Note (Signed)
This is a very pleasant 80 year old female, she has known lumbar DDD, lumbar facet arthritis, earlier in the year we found a very large right L4-L5 facet synovial cyst resulting in foraminal stenosis, likely impinging the right L5 nerve and resulting in radiculitis. We sent her to [facility] imaging for a right L4-L5 facet synovial cyst aspiration and injection, this procedure was successful and she ended up pain-free. She is unfortunately having recurrence of discomfort, we will obtain an updated MRI with plans for repeat L4-L5 facet synovial cyst aspiration and injection, if cyst is no longer present we will probably just do a epidural. I would also like a second opinion from neurosurgery to see if there is a surgical option here, we will also do 5 days of prednisone for pain relief in the meantime.

## 2021-09-25 NOTE — Progress Notes (Signed)
    Procedures performed today:    None.  Independent interpretation of notes and tests performed by another provider:   None.  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 We sent her to [facility] imaging for a right L4-L5 facet synovial cyst aspiration and injection, this procedure was successful and she ended up pain-free. She is unfortunately having recurrence of discomfort, we will obtain an updated MRI with plans for repeat L4-L5 facet synovial cyst aspiration and injection, if cyst is no longer present we will probably just do a epidural. I would also like a second opinion from neurosurgery to see if there is a surgical option here, we will also do 5 days of prednisone for pain relief in the meantime.    ___________________________________________ Gwen Her. Dianah Field, M.D., ABFM., CAQSM. Primary Care and [place] Instructor of [place] [facility] of Medicine

## 2021-09-28 ENCOUNTER — Telehealth: Payer: Self-pay

## 2021-09-28 NOTE — Telephone Encounter (Signed)
Medication: Suvorexant (BELSOMRA) 10 MG TABS Prior authorization submitted via CoverMyMeds on 09/28/2021 PA submission pending

## 2021-09-28 NOTE — Telephone Encounter (Signed)
Medication: Suvorexant (BELSOMRA) 10 MG TABS Prior authorization determination received Medication has been approved Approval dates: 08/29/2021-09/28/2022  Patient aware via: [place] aware: Yes Provider aware via this encounter

## 2021-09-30 ENCOUNTER — Other Ambulatory Visit: Payer: Self-pay

## 2021-09-30 ENCOUNTER — Ambulatory Visit (INDEPENDENT_AMBULATORY_CARE_PROVIDER_SITE_OTHER): Payer: Medicare Other

## 2021-09-30 DIAGNOSIS — M5116 Intervertebral disc disorders with radiculopathy, lumbar region: Secondary | ICD-10-CM | POA: Diagnosis not present

## 2021-09-30 DIAGNOSIS — N200 Calculus of kidney: Secondary | ICD-10-CM | POA: Diagnosis not present

## 2021-09-30 DIAGNOSIS — M5416 Radiculopathy, lumbar region: Secondary | ICD-10-CM

## 2021-09-30 DIAGNOSIS — M4726 Other spondylosis with radiculopathy, lumbar region: Secondary | ICD-10-CM | POA: Diagnosis not present

## 2021-09-30 DIAGNOSIS — M5117 Intervertebral disc disorders with radiculopathy, lumbosacral region: Secondary | ICD-10-CM | POA: Diagnosis not present

## 2021-10-04 DIAGNOSIS — M7138 Other bursal cyst, other site: Secondary | ICD-10-CM | POA: Diagnosis not present

## 2021-10-08 ENCOUNTER — Telehealth: Payer: Self-pay

## 2021-10-08 DIAGNOSIS — M5416 Radiculopathy, lumbar region: Secondary | ICD-10-CM

## 2021-10-08 MED ORDER — HYDROCODONE-ACETAMINOPHEN 10-325 MG PO TABS: 1.0000 | ORAL_TABLET | Freq: Four times a day (QID) | ORAL | 0 refills | Status: DC | PRN

## 2021-10-08 NOTE — Telephone Encounter (Signed)
Patient called to report that she had her consult with neurosurgery ad is going to have the surgery for the cyst. She is requesting a refill of prednisone and hydrocodone.

## 2021-10-08 NOTE — Telephone Encounter (Signed)
Hydrocodone refilled, holding off on additional steroid with surgery approaching.

## 2021-10-08 NOTE — Telephone Encounter (Signed)
Patient aware that only the hydrocodone was called in and why.

## 2021-10-09 ENCOUNTER — Other Ambulatory Visit: Payer: Self-pay

## 2021-10-09 ENCOUNTER — Encounter: Payer: Self-pay | Admitting: Physician Assistant

## 2021-10-09 ENCOUNTER — Ambulatory Visit (INDEPENDENT_AMBULATORY_CARE_PROVIDER_SITE_OTHER): Payer: Medicare Other | Admitting: Physician Assistant

## 2021-10-09 VITALS — BP 128/68 | HR 85 | Ht 61.0 in | Wt 141.0 lb

## 2021-10-09 DIAGNOSIS — I10 Essential (primary) hypertension: Secondary | ICD-10-CM

## 2021-10-09 DIAGNOSIS — Z13220 Encounter for screening for lipoid disorders: Secondary | ICD-10-CM

## 2021-10-09 DIAGNOSIS — E782 Mixed hyperlipidemia: Secondary | ICD-10-CM | POA: Diagnosis not present

## 2021-10-09 DIAGNOSIS — Z23 Encounter for immunization: Secondary | ICD-10-CM

## 2021-10-09 DIAGNOSIS — F5101 Primary insomnia: Secondary | ICD-10-CM | POA: Diagnosis not present

## 2021-10-09 MED ORDER — BELSOMRA 10 MG PO TABS
1.0000 | ORAL_TABLET | Freq: Every day | ORAL | 3 refills | Status: DC
Start: 2021-10-09 — End: 2021-10-10

## 2021-10-09 NOTE — Progress Notes (Signed)
Subjective:    Patient ID: Chelsey Estes, female    DOB: 20-Apr-1941, 80 y.o.   MRN: [id number]  HPI Pt is a 80 yo female with HTN, HLD, insomnia, and parkinsons disease who presents to the clinic for 6 month follow up.   Pt is in a lot of pain with her lumbar synovial cyst. She has seen neurosurgery and they will be removing it in the next 1 to 3 weeks.  She is in a significant amount of pain.  She is having loss of function in her right leg.  Patient is taking her blood pressure medication daily.  She is not checking her blood pressure at home.  She denies any chest pain, palpitations, headaches or vision changes.  Patient does need her statin refilled and lab work.  She request to go to [facility].  She is doing well on Belsomra for insomnia.  She is sleeping well needs refill.     .. Active Ambulatory Problems    Diagnosis Date Noted   History of shingles 04/08/2017   Adenomatous polyp 04/08/2017   Pure hypercholesterolemia 04/08/2017   History of kidney stones 04/08/2017   Essential hypertension 04/08/2017   Depression, recurrent ([place]) 04/08/2017   Anxiety 04/08/2017   Greater trochanteric bursitis of left hip 04/08/2017   Left hip pain 04/10/2017   Metatarsalgia of left foot 08/20/2018   Chronic foot pain, right 08/20/2018   Primary osteoarthritis of right foot 09/25/2018   Snoring 09/25/2018   Night terrors, adult 09/25/2018   Multiple falls 09/25/2018   Orthostatic hypotension 02/01/2019   Dizziness 02/01/2019   Moderate episode of recurrent major depressive disorder ([place]) 02/09/2019   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 02/09/2019   Shuffling gait 02/09/2019   Balance problems 02/09/2019   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   Blister of great toe of left foot 08/19/2019   Osteoporosis 09/15/2019   Primary parkinsonism ([place]) 10/26/2019   Mixed hyperlipidemia 06/06/2020   Primary insomnia 06/06/2020   Palpitations 09/05/2020    SOB (shortness of breath) 09/05/2020   Chest pain 09/05/2020   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 03/09/2021   DDD (degenerative disc disease), lumbar 04/11/2021   Synovial cyst of lumbar facet joint 09/19/2021   Resolved Ambulatory Problems    Diagnosis Date Noted   No Resolved Ambulatory Problems   Past Medical History:  Diagnosis Date   Depression    Glaucoma    Hyperlipidemia    Hypertension    Kidney stone     Review of Systems See HPI.     Objective:   Physical Exam Vitals reviewed.  Constitutional:      Appearance: Normal appearance. She is obese.  HENT:     Head: Normocephalic.  Cardiovascular:     Rate and Rhythm: Normal rate and regular rhythm.     Pulses: Normal pulses.  Pulmonary:     Effort: Pulmonary effort is normal.     Breath sounds: Normal breath sounds.  Neurological:     General: No focal deficit present.     Mental Status: She is alert and oriented to person, place, and time.  Psychiatric:        Mood and Affect: Mood normal.          Assessment & Plan:  Marland KitchenMarland KitchenTrace was seen today for follow-up.  Diagnoses and all orders for this visit:  Essential hypertension -     Comprehensive metabolic panel -     CBC  Need for pneumococcal vaccination -     Pneumococcal conjugate vaccine 20-valent (Prevnar 20)  Primary insomnia -     Suvorexant (BELSOMRA) 10 MG TABS; Take 1 tablet by mouth at bedtime.  Screening for lipid disorders -     Lipid Panel w/o Chol/HDL Ratio  Mixed hyperlipidemia -     Lipid Panel w/o Chol/HDL Ratio  BP looks great.  Refilled for 6 months.  Cmp ordered.  Lipid/CBC ordered as well.   Pt has lumbar synovial cyst. She is going to have neurosurgery remove it in the next 3 weeks. In a significant amount of pain.   Refilled belsomra for insomnia.    Flu shot UTD.  Covid x4 UTD.  Pneumonia 20 given today.  Shingrix to get at pharmacy.

## 2021-10-10 ENCOUNTER — Other Ambulatory Visit: Payer: Self-pay | Admitting: Neurology

## 2021-10-10 DIAGNOSIS — F5101 Primary insomnia: Secondary | ICD-10-CM

## 2021-10-10 MED ORDER — BELSOMRA 10 MG PO TABS: 1.0000 | ORAL_TABLET | Freq: Every day | ORAL | 3 refills | Status: DC

## 2021-10-10 NOTE — Telephone Encounter (Signed)
Belsomra needs resent to [facility] with Dr. Gardiner Ramus DEA number. RX pended. Please sign.

## 2021-10-11 ENCOUNTER — Other Ambulatory Visit: Payer: Self-pay | Admitting: Neurological Surgery

## 2021-10-11 ENCOUNTER — Encounter (HOSPITAL_COMMUNITY): Payer: Self-pay | Admitting: Neurological Surgery

## 2021-10-11 ENCOUNTER — Other Ambulatory Visit: Payer: Self-pay

## 2021-10-11 NOTE — Progress Notes (Addendum)
Chelsey Estes denies chest pain or shortness  Patient denies having any s/s of Covid in her household.  Patient denies any known exposure to Covid.   PCP is Iran Planas, PA-C.   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  Patient reports that she is doing well, on ocassion, may have  gait  issue.  I instructed patient to shower with antibiotic soap, if it is available.  Dry off with a clean towel. Do not put lotion, powder, cologne or deodorant or makeup.No jewelry or piercings. Men may shave their face and neck. Woman should not shave. No nail polish, artificial or acrylic nails. Wear clean clothes, brush your teeth. Glasses, contact lens,dentures or partials may not be worn in the OR. If you need to wear them, please bring a case for glasses, do not wear contacts or bring a case, the hospital does not have contact cases, dentures or partials will have to be removed , make sure they are clean, we will provide a denture cup to put them in. You will need some one to drive you home and a responsible person over the age of 26 to stay with you for the first 24 hours after surgery.

## 2021-10-12 ENCOUNTER — Other Ambulatory Visit: Payer: Self-pay

## 2021-10-12 ENCOUNTER — Encounter (HOSPITAL_COMMUNITY): Payer: Self-pay | Admitting: Neurological Surgery

## 2021-10-12 ENCOUNTER — Ambulatory Visit (HOSPITAL_COMMUNITY)
Admission: RE | Admit: 2021-10-12 | Discharge: 2021-10-12 | Disposition: A | Discharge status: Home / Self Care | Payer: Medicare Other | Attending: Neurological Surgery | Admitting: Neurological Surgery

## 2021-10-12 ENCOUNTER — Encounter (HOSPITAL_COMMUNITY)
Admission: RE | Disposition: A | Discharge status: Home / Self Care | Payer: Self-pay | Source: Home / Self Care | Attending: Neurological Surgery

## 2021-10-12 ENCOUNTER — Ambulatory Visit (HOSPITAL_COMMUNITY): Payer: Medicare Other | Admitting: Certified Registered Nurse Anesthetist

## 2021-10-12 ENCOUNTER — Ambulatory Visit (HOSPITAL_COMMUNITY): Payer: Medicare Other

## 2021-10-12 DIAGNOSIS — M48061 Spinal stenosis, lumbar region without neurogenic claudication: Secondary | ICD-10-CM | POA: Insufficient documentation

## 2021-10-12 DIAGNOSIS — Z79899 Other long term (current) drug therapy: Secondary | ICD-10-CM | POA: Insufficient documentation

## 2021-10-12 DIAGNOSIS — Z9889 Other specified postprocedural states: Secondary | ICD-10-CM | POA: Diagnosis not present

## 2021-10-12 DIAGNOSIS — Z20822 Contact with and (suspected) exposure to covid-19: Secondary | ICD-10-CM | POA: Insufficient documentation

## 2021-10-12 DIAGNOSIS — M7138 Other bursal cyst, other site: Secondary | ICD-10-CM | POA: Diagnosis not present

## 2021-10-12 DIAGNOSIS — M5416 Radiculopathy, lumbar region: Secondary | ICD-10-CM

## 2021-10-12 DIAGNOSIS — Z419 Encounter for procedure for purposes other than remedying health state, unspecified: Secondary | ICD-10-CM

## 2021-10-12 DIAGNOSIS — E7800 Pure hypercholesterolemia, unspecified: Secondary | ICD-10-CM | POA: Diagnosis not present

## 2021-10-12 DIAGNOSIS — I10 Essential (primary) hypertension: Secondary | ICD-10-CM | POA: Insufficient documentation

## 2021-10-12 DIAGNOSIS — M5116 Intervertebral disc disorders with radiculopathy, lumbar region: Secondary | ICD-10-CM | POA: Diagnosis not present

## 2021-10-12 HISTORY — PX: LUMBAR LAMINECTOMY/DECOMPRESSION MICRODISCECTOMY: SHX5026

## 2021-10-12 HISTORY — DX: Parkinson's disease: G20

## 2021-10-12 HISTORY — DX: Personal history of urinary calculi: Z87.442

## 2021-10-12 HISTORY — DX: Unspecified osteoarthritis, unspecified site: M19.90

## 2021-10-12 HISTORY — DX: Parkinson's disease without dyskinesia, without mention of fluctuations: G20.A1

## 2021-10-12 LAB — CBC
HCT: 45.5 % (ref 36.0–46.0)
Hemoglobin: 14.4 g/dL (ref 12.0–15.0)
MCH: 29.6 pg (ref 26.0–34.0)
MCHC: 31.6 g/dL (ref 30.0–36.0)
MCV: 93.6 fL (ref 80.0–100.0)
Platelets: 284 10*3/uL (ref 150–400)
RBC: 4.86 MIL/uL (ref 3.87–5.11)
RDW: 13.2 % (ref 11.5–15.5)
WBC: 10.3 10*3/uL (ref 4.0–10.5)
nRBC: 0 % (ref 0.0–0.2)

## 2021-10-12 LAB — SARS CORONAVIRUS 2 BY RT PCR (HOSPITAL ORDER, PERFORMED IN ~~LOC~~ HOSPITAL LAB): SARS Coronavirus 2: NEGATIVE

## 2021-10-12 LAB — BASIC METABOLIC PANEL
Anion gap: 11 (ref 5–15)
BUN: 15 mg/dL (ref 8–23)
CO2: 20 mmol/L — ABNORMAL LOW (ref 22–32)
Calcium: 9.6 mg/dL (ref 8.9–10.3)
Chloride: 105 mmol/L (ref 98–111)
Creatinine, Ser: 1 mg/dL (ref 0.44–1.00)
GFR, Estimated: 57 mL/min — ABNORMAL LOW (ref >60)
Glucose, Bld: 100 mg/dL — ABNORMAL HIGH (ref 70–99)
Potassium: 4.3 mmol/L (ref 3.5–5.1)
Sodium: 136 mmol/L (ref 135–145)

## 2021-10-12 SURGERY — LUMBAR LAMINECTOMY/DECOMPRESSION MICRODISCECTOMY 1 LEVEL
Anesthesia: General | Site: Back | Laterality: Right

## 2021-10-12 MED ORDER — METHOCARBAMOL 500 MG PO TABS
500.0000 mg | ORAL_TABLET | Freq: Four times a day (QID) | ORAL | Status: DC | PRN
  Administered 2021-10-12: 500 mg via ORAL
  Filled 2021-10-12 (×2): qty 1

## 2021-10-12 MED ORDER — CHLORHEXIDINE GLUCONATE 0.12 % MT SOLN
OROMUCOSAL | Status: AC
  Administered 2021-10-12: 15 mL via OROMUCOSAL
  Filled 2021-10-12: qty 15

## 2021-10-12 MED ORDER — ONDANSETRON HCL 4 MG/2ML IJ SOLN: 4.0000 mg | Freq: Four times a day (QID) | INTRAMUSCULAR | Status: DC | PRN

## 2021-10-12 MED ORDER — LISINOPRIL 2.5 MG PO TABS: 2.5000 mg | ORAL_TABLET | Freq: Every day | ORAL | Status: DC

## 2021-10-12 MED ORDER — POTASSIUM CHLORIDE IN NACL 20-0.9 MEQ/L-% IV SOLN: INTRAVENOUS | Status: DC

## 2021-10-12 MED ORDER — FENTANYL CITRATE (PF) 100 MCG/2ML IJ SOLN
INTRAMUSCULAR | Status: DC | PRN
  Administered 2021-10-12: 50 ug via INTRAVENOUS

## 2021-10-12 MED ORDER — PHENOL 1.4 % MT LIQD: 1.0000 | OROMUCOSAL | Status: DC | PRN

## 2021-10-12 MED ORDER — DEXAMETHASONE SODIUM PHOSPHATE 10 MG/ML IJ SOLN
INTRAMUSCULAR | Status: DC | PRN
  Administered 2021-10-12: 10 mg via INTRAVENOUS

## 2021-10-12 MED ORDER — FENTANYL CITRATE (PF) 100 MCG/2ML IJ SOLN
25.0000 ug | INTRAMUSCULAR | Status: DC | PRN
  Administered 2021-10-12 (×2): 25 ug via INTRAVENOUS

## 2021-10-12 MED ORDER — MORPHINE SULFATE (PF) 2 MG/ML IV SOLN: 2.0000 mg | INTRAVENOUS | Status: DC | PRN

## 2021-10-12 MED ORDER — FENTANYL CITRATE (PF) 100 MCG/2ML IJ SOLN
INTRAMUSCULAR | Status: AC
  Filled 2021-10-12: qty 2

## 2021-10-12 MED ORDER — SODIUM CHLORIDE 0.9 % IV SOLN: 250.0000 mL | INTRAVENOUS | Status: DC

## 2021-10-12 MED ORDER — ROCURONIUM BROMIDE 100 MG/10ML IV SOLN
INTRAVENOUS | Status: DC | PRN
  Administered 2021-10-12: 40 mg via INTRAVENOUS

## 2021-10-12 MED ORDER — EPHEDRINE SULFATE 50 MG/ML IJ SOLN
INTRAMUSCULAR | Status: DC | PRN
  Administered 2021-10-12: 5 mg via INTRAVENOUS
  Administered 2021-10-12: 10 mg via INTRAVENOUS

## 2021-10-12 MED ORDER — DEXAMETHASONE SODIUM PHOSPHATE 10 MG/ML IJ SOLN
INTRAMUSCULAR | Status: AC
  Filled 2021-10-12: qty 1

## 2021-10-12 MED ORDER — CEFAZOLIN SODIUM-DEXTROSE 2-4 GM/100ML-% IV SOLN
INTRAVENOUS | Status: AC
  Filled 2021-10-12: qty 100

## 2021-10-12 MED ORDER — LACTATED RINGERS IV SOLN: INTRAVENOUS | Status: DC | PRN

## 2021-10-12 MED ORDER — LIDOCAINE 2% (20 MG/ML) 5 ML SYRINGE
INTRAMUSCULAR | Status: DC | PRN
  Administered 2021-10-12: 60 mg via INTRAVENOUS

## 2021-10-12 MED ORDER — LACTATED RINGERS IV SOLN: INTRAVENOUS | Status: DC

## 2021-10-12 MED ORDER — SODIUM CHLORIDE 0.9% FLUSH: 3.0000 mL | INTRAVENOUS | Status: DC | PRN

## 2021-10-12 MED ORDER — ROCURONIUM BROMIDE 10 MG/ML (PF) SYRINGE
PREFILLED_SYRINGE | INTRAVENOUS | Status: AC
  Filled 2021-10-12: qty 10

## 2021-10-12 MED ORDER — ONDANSETRON HCL 4 MG/2ML IJ SOLN: 4.0000 mg | Freq: Once | INTRAMUSCULAR | Status: DC | PRN

## 2021-10-12 MED ORDER — PROPOFOL 10 MG/ML IV BOLUS
INTRAVENOUS | Status: AC
  Filled 2021-10-12: qty 20

## 2021-10-12 MED ORDER — ONDANSETRON HCL 4 MG/2ML IJ SOLN
INTRAMUSCULAR | Status: AC
  Filled 2021-10-12: qty 2

## 2021-10-12 MED ORDER — PROPOFOL 10 MG/ML IV BOLUS
INTRAVENOUS | Status: DC | PRN
  Administered 2021-10-12: 70 mg via INTRAVENOUS

## 2021-10-12 MED ORDER — ACETAMINOPHEN 10 MG/ML IV SOLN: 1000.0000 mg | Freq: Once | INTRAVENOUS | Status: DC | PRN

## 2021-10-12 MED ORDER — HYDROCODONE-ACETAMINOPHEN 10-325 MG PO TABS: 1.0000 | ORAL_TABLET | ORAL | Status: DC | PRN

## 2021-10-12 MED ORDER — BUPIVACAINE HCL (PF) 0.25 % IJ SOLN
INTRAMUSCULAR | Status: DC | PRN
  Administered 2021-10-12: 4 mL

## 2021-10-12 MED ORDER — ORAL CARE MOUTH RINSE: 15.0000 mL | Freq: Once | OROMUCOSAL | Status: AC

## 2021-10-12 MED ORDER — METHOCARBAMOL 1000 MG/10ML IJ SOLN: 500.0000 mg | Freq: Four times a day (QID) | INTRAVENOUS | Status: DC | PRN

## 2021-10-12 MED ORDER — DEXAMETHASONE SODIUM PHOSPHATE 4 MG/ML IJ SOLN: 4.0000 mg | Freq: Four times a day (QID) | INTRAMUSCULAR | Status: DC

## 2021-10-12 MED ORDER — METHOCARBAMOL 500 MG PO TABS
ORAL_TABLET | ORAL | Status: AC
  Filled 2021-10-12: qty 1

## 2021-10-12 MED ORDER — THROMBIN 5000 UNITS EX SOLR
CUTANEOUS | Status: AC
  Filled 2021-10-12: qty 5000

## 2021-10-12 MED ORDER — LIDOCAINE 2% (20 MG/ML) 5 ML SYRINGE
INTRAMUSCULAR | Status: AC
  Filled 2021-10-12: qty 5

## 2021-10-12 MED ORDER — ONDANSETRON HCL 4 MG/2ML IJ SOLN
INTRAMUSCULAR | Status: DC | PRN
  Administered 2021-10-12: 4 mg via INTRAVENOUS

## 2021-10-12 MED ORDER — FENTANYL CITRATE (PF) 250 MCG/5ML IJ SOLN
INTRAMUSCULAR | Status: AC
  Filled 2021-10-12: qty 5

## 2021-10-12 MED ORDER — HYDROCODONE-ACETAMINOPHEN 10-325 MG PO TABS: 1.0000 | ORAL_TABLET | ORAL | 0 refills | Status: DC | PRN

## 2021-10-12 MED ORDER — SODIUM CHLORIDE 0.9% FLUSH: 3.0000 mL | Freq: Two times a day (BID) | INTRAVENOUS | Status: DC

## 2021-10-12 MED ORDER — CEFAZOLIN SODIUM-DEXTROSE 2-3 GM-%(50ML) IV SOLR
INTRAVENOUS | Status: DC | PRN
  Administered 2021-10-12: 2 g via INTRAVENOUS

## 2021-10-12 MED ORDER — 0.9 % SODIUM CHLORIDE (POUR BTL) OPTIME
TOPICAL | Status: DC | PRN
  Administered 2021-10-12: 1000 mL

## 2021-10-12 MED ORDER — BUPROPION HCL ER (XL) 150 MG PO TB24: 150.0000 mg | ORAL_TABLET | Freq: Every day | ORAL | Status: DC

## 2021-10-12 MED ORDER — TIMOLOL MALEATE 0.5 % OP SOLN: 1.0000 [drp] | Freq: Every day | OPHTHALMIC | Status: DC

## 2021-10-12 MED ORDER — CHLORHEXIDINE GLUCONATE 0.12 % MT SOLN: 15.0000 mL | Freq: Once | OROMUCOSAL | Status: AC

## 2021-10-12 MED ORDER — THROMBIN 5000 UNITS EX SOLR
OROMUCOSAL | Status: DC | PRN
  Administered 2021-10-12: 5 mL via TOPICAL

## 2021-10-12 MED ORDER — ACETAMINOPHEN 325 MG PO TABS: 650.0000 mg | ORAL_TABLET | ORAL | Status: DC | PRN

## 2021-10-12 MED ORDER — ONDANSETRON HCL 4 MG PO TABS: 4.0000 mg | ORAL_TABLET | Freq: Four times a day (QID) | ORAL | Status: DC | PRN

## 2021-10-12 MED ORDER — CEFAZOLIN SODIUM-DEXTROSE 2-4 GM/100ML-% IV SOLN: 2.0000 g | Freq: Three times a day (TID) | INTRAVENOUS | Status: DC

## 2021-10-12 MED ORDER — BRIMONIDINE TARTRATE 0.15 % OP SOLN: 1.0000 [drp] | Freq: Three times a day (TID) | OPHTHALMIC | Status: DC

## 2021-10-12 MED ORDER — ACETAMINOPHEN 650 MG RE SUPP: 650.0000 mg | RECTAL | Status: DC | PRN

## 2021-10-12 MED ORDER — MENTHOL 3 MG MT LOZG: 1.0000 | LOZENGE | OROMUCOSAL | Status: DC | PRN

## 2021-10-12 MED ORDER — BUPIVACAINE HCL (PF) 0.25 % IJ SOLN
INTRAMUSCULAR | Status: AC
  Filled 2021-10-12: qty 30

## 2021-10-12 MED ORDER — CARBIDOPA-LEVODOPA 25-100 MG PO TABS: 1.0000 | ORAL_TABLET | Freq: Three times a day (TID) | ORAL | Status: DC

## 2021-10-12 MED ORDER — SUGAMMADEX SODIUM 200 MG/2ML IV SOLN
INTRAVENOUS | Status: DC | PRN
  Administered 2021-10-12: 200 mg via INTRAVENOUS

## 2021-10-12 MED ORDER — SENNA 8.6 MG PO TABS: 1.0000 | ORAL_TABLET | Freq: Two times a day (BID) | ORAL | Status: DC

## 2021-10-12 MED ORDER — DEXAMETHASONE 4 MG PO TABS: 4.0000 mg | ORAL_TABLET | Freq: Four times a day (QID) | ORAL | Status: DC

## 2021-10-12 SURGICAL SUPPLY — 42 items
APL SKNCLS STERI-STRIP NONHPOA (GAUZE/BANDAGES/DRESSINGS) ×1
BAG COUNTER SPONGE SURGICOUNT (BAG) ×3 IMPLANT
BAG SPNG CNTER NS LX DISP (BAG) ×2
BAND INSRT 18 STRL LF DISP RB (MISCELLANEOUS) ×2
BAND RUBBER #18 3X1/16 STRL (MISCELLANEOUS) ×4 IMPLANT
BENZOIN TINCTURE PRP APPL 2/3 (GAUZE/BANDAGES/DRESSINGS) ×2 IMPLANT
BUR CARBIDE MATCH 3.0 (BURR) ×2 IMPLANT
CANISTER SUCT 3000ML PPV (MISCELLANEOUS) ×2 IMPLANT
DRAPE LAPAROTOMY 100X72X124 (DRAPES) ×2 IMPLANT
DRAPE MICROSCOPE LEICA (MISCELLANEOUS) ×2 IMPLANT
DRAPE SURG 17X23 STRL (DRAPES) ×2 IMPLANT
DRSG OPSITE POSTOP 4X6 (GAUZE/BANDAGES/DRESSINGS) ×1 IMPLANT
DURAPREP 26ML APPLICATOR (WOUND CARE) ×2 IMPLANT
ELECT REM PT RETURN 9FT ADLT (ELECTROSURGICAL) ×2
ELECTRODE REM PT RTRN 9FT ADLT (ELECTROSURGICAL) ×1 IMPLANT
GAUZE 4X4 16PLY ~~LOC~~+RFID DBL (SPONGE) IMPLANT
GLOVE SURG ENC MOIS LTX SZ7 (GLOVE) IMPLANT
GLOVE SURG ENC MOIS LTX SZ8 (GLOVE) ×2 IMPLANT
GLOVE SURG UNDER POLY LF SZ7 (GLOVE) IMPLANT
GOWN STRL REUS W/ TWL LRG LVL3 (GOWN DISPOSABLE) IMPLANT
GOWN STRL REUS W/ TWL XL LVL3 (GOWN DISPOSABLE) ×1 IMPLANT
GOWN STRL REUS W/TWL 2XL LVL3 (GOWN DISPOSABLE) IMPLANT
GOWN STRL REUS W/TWL LRG LVL3 (GOWN DISPOSABLE) ×6
GOWN STRL REUS W/TWL XL LVL3 (GOWN DISPOSABLE) ×2
HEMOSTAT POWDER KIT SURGIFOAM (HEMOSTASIS) ×2 IMPLANT
KIT BASIN OR (CUSTOM PROCEDURE TRAY) ×2 IMPLANT
KIT TURNOVER KIT B (KITS) ×2 IMPLANT
NDL HYPO 25X1 1.5 SAFETY (NEEDLE) ×1 IMPLANT
NDL SPNL 20GX3.5 QUINCKE YW (NEEDLE) IMPLANT
NEEDLE HYPO 25X1 1.5 SAFETY (NEEDLE) ×2 IMPLANT
NEEDLE SPNL 20GX3.5 QUINCKE YW (NEEDLE) ×2 IMPLANT
NS IRRIG 1000ML POUR BTL (IV SOLUTION) ×2 IMPLANT
PACK LAMINECTOMY NEURO (CUSTOM PROCEDURE TRAY) ×2 IMPLANT
PAD ARMBOARD 7.5X6 YLW CONV (MISCELLANEOUS) ×6 IMPLANT
STRIP CLOSURE SKIN 1/2X4 (GAUZE/BANDAGES/DRESSINGS) ×2 IMPLANT
SUT VIC AB 0 CT1 18XCR BRD8 (SUTURE) ×1 IMPLANT
SUT VIC AB 0 CT1 8-18 (SUTURE) ×2
SUT VIC AB 2-0 CP2 18 (SUTURE) ×2 IMPLANT
SUT VIC AB 3-0 SH 8-18 (SUTURE) ×2 IMPLANT
TOWEL GREEN STERILE (TOWEL DISPOSABLE) ×2 IMPLANT
TOWEL GREEN STERILE FF (TOWEL DISPOSABLE) ×2 IMPLANT
WATER STERILE IRR 1000ML POUR (IV SOLUTION) ×2 IMPLANT

## 2021-10-12 NOTE — Anesthesia Postprocedure Evaluation (Signed)
Anesthesia Post Note  Patient: Chelsey Estes  Procedure(s) Performed: Right Lumbar four-five Laminectomy for facet/synovial cyst (Right: Back)     Patient location during evaluation: PACU Anesthesia Type: General Level of consciousness: awake and alert Pain management: pain level controlled Vital Signs Assessment: post-procedure vital signs reviewed and stable Respiratory status: spontaneous breathing, nonlabored ventilation, respiratory function stable and patient connected to nasal cannula oxygen Cardiovascular status: blood pressure returned to baseline and stable Postop Assessment: no apparent nausea or vomiting Anesthetic complications: no   No notable events documented.  Last Vitals:  Vitals:   10/12/21 1340 10/12/21 1355  BP: (!) 149/70 (!) 152/70  Pulse: 71 70  Resp: 11 14  Temp:  36.5 C  SpO2: 100% 99%    Last Pain:  Vitals:   10/12/21 1310  TempSrc:   PainSc: 5                  Barnet Glasgow

## 2021-10-12 NOTE — Anesthesia Preprocedure Evaluation (Addendum)
Anesthesia Evaluation  Patient identified by MRN, date of birth, ID band Patient awake    Reviewed: Allergy & Precautions, NPO status , Patient's Chart, lab work & pertinent test results  Airway Mallampati: II  TM Distance: >3 FB Neck ROM: Full    Dental no notable dental hx. (+) Teeth Intact, Dental Advisory Given   Pulmonary    Pulmonary exam normal breath sounds clear to auscultation       Cardiovascular hypertension, Pt. on medications Normal cardiovascular exam Rhythm:Regular Rate:Normal     Neuro/Psych  Neuromuscular disease (Pakinsons Dz) negative psych ROS   GI/Hepatic negative GI ROS, Neg liver ROS,   Endo/Other  negative endocrine ROS  Renal/GU negative Renal ROS     Musculoskeletal  (+) Arthritis ,   Abdominal   Peds  Hematology Lab Results      Component                Value               Date                      WBC                      10.3                10/12/2021                HGB                      14.4                10/12/2021                HCT                      45.5                10/12/2021                MCV                      93.6                10/12/2021                PLT                      284                 10/12/2021              Anesthesia Other Findings   Reproductive/Obstetrics                            Anesthesia Physical Anesthesia Plan  ASA: 3  Anesthesia Plan: General   Post-op Pain Management:    Induction:   PONV Risk Score and Plan: 4 or greater and Treatment may vary due to age or medical condition and Ondansetron  Airway Management Planned: Oral ETT  Additional Equipment: None  Intra-op Plan:   Post-operative Plan: Extubation in OR  Informed Consent: I have reviewed the patients History and Physical, chart, labs and discussed the procedure including the risks, benefits and alternatives for the proposed anesthesia with  the patient or authorized representative who has indicated  his/her understanding and acceptance.     Dental advisory given  Plan Discussed with: CRNA and Anesthesiologist  Anesthesia Plan Comments:         Anesthesia Quick Evaluation

## 2021-10-12 NOTE — H&P (Signed)
Subjective: Patient is a 80 y.o. female admitted for severe right leg pain. Onset of symptoms was several weeks ago, gradually worsening since that time.  The pain is rated severe, and is located at the across the lower back and radiates to right lower extremity. The pain is described as aching and occurs all day. The symptoms have been progressive. Symptoms are exacerbated by exercise, standing, and twisting. MRI or CT showed right L4-5 synovial cyst  Past Medical History:  Diagnosis Date   Anxiety    Arthritis    Depression    Glaucoma    History of kidney stones    7 lithrotripsy   Hyperlipidemia    Hypertension    Parkinson disease ([place])     Past Surgical History:  Procedure Laterality Date   ABDOMINAL HYSTERECTOMY     ANTERIOR (CYSTOCELE) AND POSTERIOR REPAIR (RECTOCELE) WITH XENFORM GRAFT AND SACROSPINOUS FIXATION     CATARACT EXTRACTION, BILATERAL     INGUINAL HERNIA REPAIR Left     Prior to Admission medications   Medication Sig Start Date End Date Taking? Authorizing Provider  ALPHAGAN P 0.1 % SOLN Place 1 drop into both eyes in the morning and at bedtime.  02/10/19  Yes [provider]  buPROPion (WELLBUTRIN XL) 300 MG 24 hr tablet Take one tablet daily. 09/10/21  Yes Breeback, Jade L, PA-C  carbidopa-levodopa (SINEMET IR) 25-100 MG tablet Take 1 tablet by mouth 3 (three) times daily. 09/13/21  Yes Tat, Eustace Quail, DO  cholecalciferol (VITAMIN D3) 25 MCG (1000 UNIT) tablet Take 1,000 Units by mouth daily.   Yes [provider]  cyanocobalamin 2000 MCG tablet Take 2,000 mcg by mouth daily.   Yes [provider]  HYDROcodone-acetaminophen (NORCO) 10-325 MG tablet Take 1 tablet by mouth every 6 (six) hours as needed. 10/08/21  Yes Silverio Decamp, MD  lisinopril (ZESTRIL) 5 MG tablet TAKE ONE TABLET BY MOUTH TWICE DAILY 05/30/21  Yes Breeback, Jade L, PA-C  simvastatin (ZOCOR) 10 MG tablet TAKE 1 TABLET(10 MG) BY MOUTH DAILY/ needs labs 08/28/21  Yes  Breeback, Jade L, PA-C  Suvorexant (BELSOMRA) 10 MG TABS Take 1 tablet by mouth at bedtime. 10/10/21  Yes Breeback, Jade L, PA-C  timolol (TIMOPTIC) 0.5 % ophthalmic solution Place 1 drop into both eyes daily.  02/10/19  Yes [provider]  vitamin k 100 MCG tablet Take 100 mcg by mouth daily.   Yes [provider]   Allergies  Allergen Reactions   Trazodone And Nefazodone Nausea Only and Other (See Comments)    Nausea/dizzines/cramps     Social History   Tobacco Use   Smoking status: Never   Smokeless tobacco: Never  Substance Use Topics   Alcohol use: Never    Family History  Problem Relation Age of Onset   Alcohol abuse Father    Cancer Father        esophageal   Stroke Maternal Grandfather    Alcohol abuse Paternal Grandfather    Alzheimer's disease Mother      Review of Systems  Positive ROS: neg  All other systems have been reviewed and were otherwise negative with the exception of those mentioned in the HPI and as above.  Objective: Vital signs in last 24 hours:    General Appearance: Alert, cooperative, no distress, appears stated age Head: Normocephalic, without obvious abnormality, atraumatic Eyes: PERRL, conjunctiva/corneas clear, EOM's intact    Neck: Supple, symmetrical, trachea midline Back: Symmetric, no curvature, ROM normal, no  CVA tenderness Lungs:  respirations unlabored Heart: Regular rate and rhythm Abdomen: Soft, non-tender Extremities: Extremities normal, atraumatic, no cyanosis or edema Pulses: 2+ and symmetric all extremities Skin: Skin color, texture, turgor normal, no rashes or lesions  NEUROLOGIC:   Mental status: Alert and oriented x4,  no aphasia, good attention span, fund of knowledge, and memory Motor Exam - grossly normal Sensory Exam - grossly normal Reflexes: 1+ Coordination - grossly normal Gait - grossly normal Balance - grossly normal Cranial Nerves: I: smell Not tested  II: visual acuity  OS: nl     OD: nl  II: visual fields Full to confrontation  II: pupils Equal, round, reactive to light  III,VII: ptosis None  III,IV,VI: extraocular muscles  Full ROM  V: mastication Normal  V: facial light touch sensation  Normal  V,VII: corneal reflex  Present  VII: facial muscle function - upper  Normal  VII: facial muscle function - lower Normal  VIII: hearing Not tested  IX: soft palate elevation  Normal  IX,X: gag reflex Present  XI: trapezius strength  5/5  XI: sternocleidomastoid strength 5/5  XI: neck flexion strength  5/5  XII: tongue strength  Normal    Data Review Lab Results  Component Value Date   WBC 8.5 09/05/2020   HGB 14.5 09/05/2020   HCT 44.2 09/05/2020   MCV 92 09/05/2020   PLT 293 09/05/2020   Lab Results  Component Value Date   NA 140 04/09/2021   K 4.4 04/09/2021   CL 104 04/09/2021   CO2 21 04/09/2021   BUN 25 04/09/2021   CREATININE 1.18 (H) 04/09/2021   GLUCOSE 90 04/09/2021   No results found for: INR, PROTIME  Assessment/Plan:  Estimated body mass index is 26.64 kg/m as calculated from the following:   Height as of 10/09/21: 5\' 1"  (1.549 m).   Weight as of 10/09/21: 64 kg. Patient admitted for right L4-5 hemilaminectomy for resection of synovial cyst. Patient has failed a reasonable attempt at conservative therapy.  I explained the condition and procedure to the patient and answered any questions.  Patient wishes to proceed with procedure as planned. Understands risks/ benefits and typical outcomes of procedure.   Eustace Moore 10/12/2021 10:08 AM

## 2021-10-12 NOTE — Op Note (Signed)
10/12/2021  12:26 PM  PATIENT:  Chelsey Estes  80 y.o. 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 Imaging showed large right-sided synovial cyst with severe right lateral recess stenosis L4-5 on the right. The patient tried conservative measures without relief. Pain was debilitating. Recommended right L4-5 hemilaminectomy with resection of the synovial cyst. Patient understood the risks, benefits, and alternatives and potential outcomes and wished to proceed.  PROCEDURE DETAILS: The patient was taken to the operating room and after induction of adequate generalized endotracheal anesthesia, the patient was rolled into the prone position on the Wilson frame and all pressure points were padded. The lumbar region was cleaned and then prepped with DuraPrep and draped in the usual sterile fashion. 5 cc of local anesthesia was injected and then a dorsal midline incision was made and carried down to the lumbo sacral fascia. The fascia was opened and the paraspinous musculature was taken down in a subperiosteal fashion to expose L4-5 on the right. Intraoperative x-ray confirmed my level, and then I used a combination of the high-speed drill and the Kerrison punches to perform a hemilaminectomy, medial facetectomy, and foraminotomy at L4-5 on the right.  There was a large synovial cyst lateral to the dura causing significant compression of the dura.  I dissected between the cyst and the dura with  a Chartered loss adjuster.  I undercut the underlying yellow ligament was opened and removed in a piecemeal fashion to expose the underlying dura and exiting nerve root resecting the cyst.  I peeled the cyst wall away from the dura.  I undercut the lateral recess and dissected down until I was medial to and distal to the pedicle. The nerve root was well decompressed. We then gently retracted the nerve root medially with a retractor, coagulated the epidural venous vasculature, and inspected the disc space. I then palpated with a coronary dilator along the nerve root and into the foramen to assure adequate decompression. I felt no more compression of the nerve root. I irrigated with saline solution containing bacitracin. Achieved hemostasis with bipolar cautery and Surgifoam, and then closed the fascia with 0 Vicryl. I closed the subcutaneous tissues with 2-0 Vicryl and the subcuticular tissues with 3-0 Vicryl. The skin was then closed with benzoin and Steri-Strips. The drapes were removed, a sterile dressing was applied.  My nurse practitioner was involved in the exposure, safe retraction of the neural elements, the disc work and the closure. the patient was awakened from general anesthesia and transferred to the recovery room in stable condition. At the end of the procedure all sponge, needle and instrument counts were correct.    PLAN OF CARE: Discharge to home after PACU  PATIENT DISPOSITION:  PACU - hemodynamically stable.   Delay start of Pharmacological VTE agent (>24hrs) due to surgical blood loss or risk of bleeding:  yes

## 2021-10-12 NOTE — Transfer of Care (Signed)
Immediate Anesthesia Transfer of Care Note  Patient: Chelsey Estes  Procedure(s) Performed: Right Lumbar four-five Laminectomy for facet/synovial cyst (Right: Back)  Patient Location: PACU  Anesthesia Type:General  Level of Consciousness: awake, alert  and oriented  Airway & Oxygen Therapy: Patient Spontanous Breathing and Patient connected to face mask oxygen  Post-op Assessment: Report given to RN and Post -op Vital signs reviewed and stable  Post vital signs: Reviewed and stable  Last Vitals:  Vitals Value Taken Time  BP 195/85 10/12/21 1238  Temp    Pulse 69 10/12/21 1238  Resp    SpO2 100 % 10/12/21 1238  Vitals shown include unvalidated device data.  Last Pain:  Vitals:   10/12/21 1035  TempSrc:   PainSc: 10-Worst pain ever      Patients Stated Pain Goal: 3 (85/99/23 4144)  Complications: No notable events documented.

## 2021-10-12 NOTE — Anesthesia Procedure Notes (Signed)
Procedure Name: Intubation Date/Time: 10/12/2021 11:23 AM Performed by: Kerry Fort, CRNA Pre-anesthesia Checklist: Patient identified, Emergency Drugs available, Suction available and Patient being monitored Patient Re-evaluated:Patient Re-evaluated prior to induction Oxygen Delivery Method: Circle system utilized Preoxygenation: Pre-oxygenation with 100% oxygen Induction Type: IV induction Ventilation: Mask ventilation without difficulty and Oral airway inserted - appropriate to patient size Laryngoscope Size: Mac and 3 Grade View: Grade I Tube type: Oral Tube size: 7.0 mm Number of attempts: 1 Airway Equipment and Method: Stylet and Oral airway Placement Confirmation: ETT inserted through vocal cords under direct vision, positive ETCO2 and breath sounds checked- equal and bilateral Secured at: 21 cm Tube secured with: Tape Dental Injury: Teeth and Oropharynx as per pre-operative assessment

## 2021-10-13 ENCOUNTER — Encounter (HOSPITAL_COMMUNITY): Payer: Self-pay | Admitting: Neurological Surgery

## 2021-10-16 LAB — POCT I-STAT, CHEM 8
BUN: 15 mg/dL (ref 8–23)
Calcium, Ion: 1.21 mmol/L (ref 1.15–1.40)
Chloride: 105 mmol/L (ref 98–111)
Creatinine, Ser: 1 mg/dL (ref 0.44–1.00)
Glucose, Bld: 102 mg/dL — ABNORMAL HIGH (ref 70–99)
HCT: 44 % (ref 36.0–46.0)
Hemoglobin: 15 g/dL (ref 12.0–15.0)
Potassium: 3.7 mmol/L (ref 3.5–5.1)
Sodium: 139 mmol/L (ref 135–145)
TCO2: 25 mmol/L (ref 22–32)

## 2021-10-23 ENCOUNTER — Ambulatory Visit: Payer: Medicare Other | Admitting: Sports Medicine

## 2021-10-24 ENCOUNTER — Ambulatory Visit (INDEPENDENT_AMBULATORY_CARE_PROVIDER_SITE_OTHER): Payer: Medicare Other | Admitting: Physician Assistant

## 2021-10-24 DIAGNOSIS — Z0000 Encounter for general adult medical examination without abnormal findings: Secondary | ICD-10-CM | POA: Diagnosis not present

## 2021-10-24 DIAGNOSIS — Z780 Asymptomatic menopausal state: Secondary | ICD-10-CM | POA: Diagnosis not present

## 2021-10-24 NOTE — Progress Notes (Signed)
MEDICARE ANNUAL WELLNESS VISIT  10/24/2021  Telephone Visit Disclaimer This Medicare AWV was conducted by telephone due to national recommendations for restrictions regarding the COVID-19 Pandemic (e.g. social distancing).  I verified, using two identifiers, that I am speaking with Chelsey Estes or their authorized healthcare agent. I discussed the limitations, risks, security, and privacy concerns of performing an evaluation and management service by telephone and the potential availability of an in-person appointment in the future. The patient expressed understanding and agreed to proceed.  Location of Patient: Home Location of Provider (nurse):  Provider home.  Subjective:    Chelsey Estes is a 80 y.o. female patient of [name], [name], PA-C who had a Medicare Annual Wellness Visit today via telephone. Chelsey Estes is Retired and lives with their spouse. she has 2 children. she reports that she is socially active and does interact with friends/family regularly. she is minimally physically active and enjoys reading, playing games on her Ipad and spending time with her dogs.  Patient Care Team: [name] as PCP - General (Family Medicine)  Advanced Directives 10/24/2021 10/12/2021 09/13/2021 11/07/2020 05/05/2020 06/15/2019 02/15/2019  Does Patient Have a Medical Advance Directive? No No No No No No No  Would patient like information on creating a medical advance directive? No - Patient declined No - Patient declined - - - No - Patient declined Yes (MAU/Ambulatory/Procedural Areas - Information given)    Hospital Utilization Over the Past 12 Months: # of hospitalizations or ER visits: 1 # of surgeries: 1  Review of Systems    Patient reports that her overall health is worse compared to last year.  History obtained from chart review and the patient  Patient Reported Readings (BP, Pulse, CBG, Weight, etc) none  Pain Assessment Pain : No/denies pain Pain Score: 2  Pain Type:  Other (Comment) (surgical pain) Pain Location: Back Pain Orientation: Lower Pain Descriptors / Indicators: Discomfort, Sore, Aching     Current Medications & Allergies (verified) Allergies as of 10/24/2021       Reactions   Trazodone And Nefazodone Nausea Only, Other (See Comments)   Nausea/dizzines/cramps        Medication List        Accurate as of October 24, 2021  8:39 AM. If you have any questions, ask your nurse or doctor.          Alphagan P 0.1 % Soln Generic drug: brimonidine Place 1 drop into both eyes in the morning and at bedtime.   Belsomra 10 MG Tabs Generic drug: Suvorexant Take 1 tablet by mouth at bedtime.   buPROPion 300 MG 24 hr tablet Commonly known as: WELLBUTRIN XL Take one tablet daily.   carbidopa-levodopa 25-100 MG tablet Commonly known as: SINEMET IR Take 1 tablet by mouth 3 (three) times daily.   cholecalciferol 25 MCG (1000 UNIT) tablet Commonly known as: VITAMIN D3 Take 1,000 Units by mouth daily.   cyanocobalamin 2000 MCG tablet Take 2,000 mcg by mouth daily.   HYDROcodone-acetaminophen 10-325 MG tablet Commonly known as: NORCO Take 1 tablet by mouth every 4 (four) hours as needed.   lisinopril 5 MG tablet Commonly known as: ZESTRIL TAKE ONE TABLET BY MOUTH TWICE DAILY   methylPREDNISolone 4 MG Tbpk tablet Commonly known as: MEDROL DOSEPAK Take by mouth.   simvastatin 10 MG tablet Commonly known as: ZOCOR TAKE 1 TABLET(10 MG) BY MOUTH DAILY/ needs labs   timolol 0.5 % ophthalmic solution Commonly known as: TIMOPTIC Place 1 drop into  both eyes daily.   vitamin k 100 MCG tablet Take 100 mcg by mouth daily.        History (reviewed): Past Medical History:  Diagnosis Date   Anxiety    Arthritis    Depression    Glaucoma    History of kidney stones    7 lithrotripsy   Hyperlipidemia    Hypertension    Parkinson disease ([place])    Past Surgical History:  Procedure Laterality Date   ABDOMINAL HYSTERECTOMY      ANTERIOR (CYSTOCELE) AND POSTERIOR REPAIR (RECTOCELE) WITH XENFORM GRAFT AND SACROSPINOUS FIXATION     CATARACT EXTRACTION, BILATERAL     INGUINAL HERNIA REPAIR Left    LUMBAR LAMINECTOMY/DECOMPRESSION MICRODISCECTOMY Right 10/12/2021   Procedure: Right Lumbar four-five Laminectomy for facet/synovial cyst;  Surgeon: Eustace Moore, MD;  Location: [facility];  Service: Neurosurgery;  Laterality: Right;   Family History  Problem Relation Age of Onset   Alcohol abuse Father    Cancer Father        esophageal   Stroke Maternal Grandfather    Alcohol abuse Paternal Grandfather    Alzheimer's disease Mother    Social History   Socioeconomic History   Marital status: Married    Spouse name: Ron   Number of children: 2   Years of education: 14   Highest education level: Some college, no degree  Occupational History   Occupation: Educational psychologist- Psychologist, prison and probation services    Comment: retired  Tobacco Use   Smoking status: Never   Smokeless tobacco: Never  Scientific laboratory technician Use: Never used  Substance and Sexual Activity   Alcohol use: Never   Drug use: Never   Sexual activity: Not Currently  Other Topics Concern   Not on file  Social History Narrative   Lives with her husband. 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 She has two children. She enjoys reading, playing games and spending time with her dogs (when she can).    Social Determinants of Health   Financial Resource Strain: Low Risk    Difficulty of Paying Living Expenses: Not hard at all  Food Insecurity: No Food Insecurity   Worried About Charity fundraiser in the Last Year: Never true   [place] in the Last Year: Never true  Transportation Needs: No Transportation Needs   Lack of Transportation (Medical): No   Lack of Transportation (Non-Medical): No  Physical Activity: Inactive   Days of Exercise per Week: 0 days   Minutes of Exercise per Session: 0 min  Stress: Stress Concern Present   Feeling of Stress : To  some extent  Social Connections: Moderately Isolated   Frequency of Communication with Friends and Family: More than three times a week   Frequency of Social Gatherings with Friends and Family: Once a week   Attends Religious Services: Never   Marine scientist or Organizations: No   Attends Archivist Meetings: Never   Marital Status: Married    Activities of Daily Living In your present state of health, do you have any difficulty performing the following activities: 10/24/2021 10/12/2021  Hearing? Y -  Comment her kids have told her that she has trouble hearing. -  Vision? N -  Difficulty concentrating or making decisions? Y -  Comment sometimes. -  Walking or climbing stairs? Y -  Comment due to her back and unsteadiness. -  Dressing or bathing? N -  Doing errands, shopping? N N  Preparing Food and eating ? N -  Using the Toilet? N -  In the past six months, have you accidently leaked urine? N -  Do you have problems with loss of bowel control? N -  Managing your Medications? N -  Managing your Finances? N -  Housekeeping or managing your Housekeeping? N -  Comment her sister comes and helps with that. -  Some recent data might be hidden    Patient Education/ Literacy How often do you need to have someone help you when you read instructions, pamphlets, or other written materials from your doctor or pharmacy?: 1 - Never What is the last grade level you completed in school?: High school diploma and one year of college  Exercise Current Exercise Habits: The patient does not participate in regular exercise at present, Exercise limited by: orthopedic condition(s);Other - see comments (recent back surgery.)  Diet Patient reports consuming  1-2  meals a day and 2-3 snack(s) a day Patient reports that her primary diet is: Regular Patient reports that she does have regular access to food.   Depression Screen PHQ 2/9 Scores 10/24/2021 10/09/2021 04/09/2021 09/05/2020  05/05/2020 06/15/2019 03/15/2019  PHQ - 2 Score 2 0 4 2 2 1 3   PHQ- 9 Score 12 3 14 10 9  - 5    Patient stated she has recently had surgery and it has really affected her mood. She would like to wait to make an appointment with [facility].  Fall Risk Fall Risk  10/24/2021 10/09/2021 09/13/2021 04/09/2021 11/07/2020  Falls in the past year? 1 1 1 1  0  Number falls in past yr: 1 1 1 1  0  Comment 6 - - - -  Injury with Fall? 1 1 0 0 0  Risk for fall due to : History of fall(s);Impaired balance/gait;Impaired mobility;Other (Comment) History of fall(s) - History of fall(s) -  Risk for fall due to: Comment has parkinson's disease. - - - -  Follow up Falls evaluation completed;Education provided;Falls prevention discussed Falls evaluation completed - Falls evaluation completed -     Objective:  Faelynn Wynder seemed alert and oriented and she participated appropriately during our telephone visit.  Blood Pressure Weight BMI  BP Readings from Last 3 Encounters:  10/12/21 (!) 152/70  10/09/21 128/68  09/13/21 135/62   Wt Readings from Last 3 Encounters:  10/12/21 141 lb (64 kg)  10/09/21 141 lb (64 kg)  09/19/21 143 lb (64.9 kg)   BMI Readings from Last 1 Encounters:  10/12/21 26.64 kg/m    *Unable to obtain current vital signs, weight, and BMI due to telephone visit type  Hearing/Vision  Jahliyah did not seem to have difficulty with hearing/understanding during the telephone conversation Reports that she has had a formal eye exam by an eye care professional within the past year Reports that she has not had a formal hearing evaluation within the past year *Unable to fully assess hearing and vision during telephone visit type  Cognitive Function: 6CIT Screen 10/24/2021 06/15/2019  What Year? 0 points 0 points  What month? 0 points 0 points  What time? 0 points 0 points  Count back from 20 0 points 0 points  Months in reverse 0 points 0 points  Repeat phrase 0 points 0 points  Total Score 0 0    (Normal:0-7, Significant for Dysfunction: >8)  Normal Cognitive Function Screening: Yes   Immunization & Health Maintenance Record Immunization History  Administered Date(s) Administered   Fluad Quad(high Dose 65+) 09/15/2020,  09/04/2021   Influenza,inj,Quad PF,6+ Mos 08/19/2019   Influenza-Unspecified 09/15/2018   PFIZER(Purple Top)SARS-COV-2 Vaccination 01/09/2020, 01/31/2020, 07/29/2020, 05/02/2021, 09/04/2021   PNEUMOCOCCAL CONJUGATE-20 10/09/2021   Pneumococcal Polysaccharide-23 09/25/2018, 06/05/2020   Tdap 10/08/2017    Health Maintenance  Topic Date Due   Zoster Vaccines- Shingrix (1 of 2) 01/24/2022 (Originally 03/20/1960)   COVID-19 Vaccine (6 - Booster for Pfizer series) 10/30/2021   MAMMOGRAM  03/14/2022   TETANUS/TDAP  10/09/2027   Pneumonia Vaccine 58+ Years old  Completed   INFLUENZA VACCINE  Completed   DEXA SCAN  Completed   HPV VACCINES  Aged Out       Assessment  This is a routine wellness examination for Chelsey Estes.  Health Maintenance: Due or Overdue There are no preventive care reminders to display for this patient.   Chelsey Estes does not need a referral for Community Assistance: Care Management:   no Social Work:    no Prescription Assistance:  no Nutrition/Diabetes Education:  no   Plan:  Personalized Goals  Goals Addressed               This Visit's Progress     Patient Stated (pt-stated)        Would like to more activities outside such as gardening. She has been in a lot of pain this year due to her sciatica and would like to get back to her routine.       Personalized Health Maintenance & Screening Recommendations  Bone densitometry screening Shingrix vaccine  Lung Cancer Screening Recommended: no (Low Dose CT Chest recommended if Age 58-80 years, 30 pack-year currently smoking OR have quit w/in past 15 years) Hepatitis C Screening recommended: no HIV Screening recommended: no  Advanced Directives: Written  information was not prepared per patient's request.  Referrals & Orders Orders Placed This Encounter  Procedures   [place]   Follow-up Plan Follow-up with Iran Planas L, PA-C as planned, to discuss your depression screening. Schedule your shingrix vaccine at the pharmacy when you are ready.  Bone density referral has been sent.  Patient will look at AVS on my chart.   I have personally reviewed and noted the following in the patient's chart:   Medical and social history Use of alcohol, tobacco or illicit drugs  Current medications and supplements Functional ability and status Nutritional status Physical activity Advanced directives List of other physicians Hospitalizations, surgeries, and ER visits in previous 12 months Vitals Screenings to include cognitive, depression, and falls Referrals and appointments  In addition, I have reviewed and discussed with Chelsey Estes certain preventive protocols, quality metrics, and best practice recommendations. A written personalized care plan for preventive services as well as general preventive health recommendations is available and can be mailed to the patient at her request.      Tinnie Gens, RN  10/24/2021

## 2021-10-24 NOTE — Patient Instructions (Addendum)
[place] Maintenance Summary and Written Plan of Care  [name] ,  Thank you for allowing me to perform your Medicare Annual Wellness Visit and for your ongoing commitment to your health.   Health Maintenance & Immunization History Health Maintenance  Topic Date Due   Zoster Vaccines- Shingrix (1 of 2) 01/24/2022 (Originally 03/20/1960)   COVID-19 Vaccine (6 - Booster for Pfizer series) 10/30/2021   MAMMOGRAM  03/14/2022   TETANUS/TDAP  10/09/2027   Pneumonia Vaccine 63+ Years old  Completed   INFLUENZA VACCINE  Completed   DEXA SCAN  Completed   HPV VACCINES  Aged Out   Immunization History  Administered Date(s) Administered   Fluad Quad(high Dose 65+) 09/15/2020, 09/04/2021   Influenza,inj,Quad PF,6+ Mos 08/19/2019   Influenza-Unspecified 09/15/2018   PFIZER(Purple Top)SARS-COV-2 Vaccination 01/09/2020, 01/31/2020, 07/29/2020, 05/02/2021, 09/04/2021   PNEUMOCOCCAL CONJUGATE-20 10/09/2021   Pneumococcal Polysaccharide-23 09/25/2018, 06/05/2020   Tdap 10/08/2017    These are the patient goals that we discussed:  Goals Addressed               This Visit's Progress     Patient Stated (pt-stated)        Would like to more activities outside such as gardening. She has been in a lot of pain this year due to her sciatica and would like to get back to her routine.         This is a list of Health Maintenance Items that are overdue or due now: Bone densitometry screening Shingrix vaccine  Orders/Referrals Placed Today: Orders Placed This Encounter  Procedures   DEXAScan    Standing Status:   Future    Standing Expiration Date:   10/24/2022    Scheduling Instructions:     Please call patient to schedule.    Order Specific Question:   Reason for exam:    Answer:   post menopausal    Order Specific Question:   Preferred imaging location?    Answer:   [facility]    (Contact our referral department at [phone] if you  have not spoken with someone about your referral appointment within the next 5 days)    Follow-up Plan Follow-up with Donella Stade, PA-C as planned, to discuss your depression screening. Schedule your shingrix vaccine at the pharmacy when you are ready.  Bone density referral has been sent.  Patient will look at AVS on my chart.      Health Maintenance, Female Adopting a healthy lifestyle and getting preventive care are important in promoting health and wellness. Ask your health care provider about: The right schedule for you to have regular tests and exams. Things you can do on your own to prevent diseases and keep yourself healthy. What should I know about diet, weight, and exercise? Eat a healthy diet  Eat a diet that includes plenty of vegetables, fruits, low-fat dairy products, and lean protein. Do not eat a lot of foods that are high in solid fats, added sugars, or sodium. Maintain a healthy weight Body mass index (BMI) is used to identify weight problems. It estimates body fat based on height and weight. Your health care provider can help determine your BMI and help you achieve or maintain a healthy weight. Get regular exercise Get regular exercise. This is one of the most important things you can do for your health. Most adults should: Exercise for at least 150 minutes each week. The exercise should increase your heart rate and make you  sweat (moderate-intensity exercise). Do strengthening exercises at least twice a week. This is in addition to the moderate-intensity exercise. Spend less time sitting. Even light physical activity can be beneficial. Watch cholesterol and blood lipids Have your blood tested for lipids and cholesterol at 80 years of age, then have this test every 5 years. Have your cholesterol levels checked more often if: Your lipid or cholesterol levels are high. You are older than 80 years of age. You are at high risk for heart disease. What should I  know about cancer screening? Depending on your health history and family history, you may need to have cancer screening at various ages. This may include screening for: Breast cancer. Cervical cancer. Colorectal cancer. Skin cancer. Lung cancer. What should I know about heart disease, diabetes, and high blood pressure? Blood pressure and heart disease High blood pressure causes heart disease and increases the risk of stroke. This is more likely to develop in people who have high blood pressure readings or are overweight. Have your blood pressure checked: Every 3-5 years if you are 41-36 years of age. Every year if you are 65 years old or older. Diabetes Have regular diabetes screenings. This checks your fasting blood sugar level. Have the screening done: Once every three years after age 74 if you are at a normal weight and have a low risk for diabetes. More often and at a younger age if you are overweight or have a high risk for diabetes. What should I know about preventing infection? Hepatitis B If you have a higher risk for hepatitis B, you should be screened for this virus. Talk with your health care provider to find out if you are at risk for hepatitis B infection. Hepatitis C Testing is recommended for: Everyone born from [birth year] through 1965. Anyone with known risk factors for hepatitis C. Sexually transmitted infections (STIs) Get screened for STIs, including gonorrhea and chlamydia, if: You are sexually active and are younger than 80 years of age. You are older than 80 years of age and your health care provider tells you that you are at risk for this type of infection. Your sexual activity has changed since you were last screened, and you are at increased risk for chlamydia or gonorrhea. Ask your health care provider if you are at risk. Ask your health care provider about whether you are at high risk for HIV. Your health care provider may recommend a prescription medicine to help  prevent HIV infection. If you choose to take medicine to prevent HIV, you should first get tested for HIV. You should then be tested every 3 months for as long as you are taking the medicine. Pregnancy If you are about to stop having your period (premenopausal) and you may become pregnant, seek counseling before you get pregnant. Take 400 to 800 micrograms (mcg) of folic acid every day if you become pregnant. Ask for birth control (contraception) if you want to prevent pregnancy. Osteoporosis and menopause Osteoporosis is a disease in which the bones lose minerals and strength with aging. This can result in bone fractures. If you are 35 years old or older, or if you are at risk for osteoporosis and fractures, ask your health care provider if you should: Be screened for bone loss. Take a calcium or vitamin D supplement to lower your risk of fractures. Be given hormone replacement therapy (HRT) to treat symptoms of menopause. Follow these instructions at home: Alcohol use Do not drink alcohol if: Your health care provider  tells you not to drink. You are pregnant, may be pregnant, or are planning to become pregnant. If you drink alcohol: Limit how much you have to: 0-1 drink a day. Know how much alcohol is in your drink. In the [place] one drink equals one 12 oz bottle of beer (355 mL), one 5 oz glass of wine (148 mL), or one 1 oz glass of hard liquor (44 mL). Lifestyle Do not use any products that contain nicotine or tobacco. These products include cigarettes, chewing tobacco, and vaping devices, such as e-cigarettes. If you need help quitting, ask your health care provider. Do not use street drugs. Do not share needles. Ask your health care provider for help if you need support or information about quitting drugs. General instructions Schedule regular health, dental, and eye exams. Stay current with your vaccines. Tell your health care provider if: You often feel depressed. You have ever  been abused or do not feel safe at home. Summary Adopting a healthy lifestyle and getting preventive care are important in promoting health and wellness. Follow your health care provider's instructions about healthy diet, exercising, and getting tested or screened for diseases. Follow your health care provider's instructions on monitoring your cholesterol and blood pressure. This information is not intended to replace advice given to you by your health care provider. Make sure you discuss any questions you have with your health care provider. Document Revised: 04/23/2021 Document Reviewed: 04/23/2021 Elsevier Patient Education  [place].

## 2021-10-26 ENCOUNTER — Ambulatory Visit: Payer: Medicare Other | Admitting: Sports Medicine

## 2021-10-31 DIAGNOSIS — I10 Essential (primary) hypertension: Secondary | ICD-10-CM | POA: Diagnosis not present

## 2021-11-01 ENCOUNTER — Other Ambulatory Visit: Payer: Self-pay | Admitting: Physician Assistant

## 2021-11-01 LAB — CBC
Hematocrit: 44.1 % (ref 34.0–46.6)
Hemoglobin: 14.4 g/dL (ref 11.1–15.9)
MCH: 29 pg (ref 26.6–33.0)
MCHC: 32.7 g/dL (ref 31.5–35.7)
MCV: 89 fL (ref 79–97)
Platelets: 399 10*3/uL (ref 150–450)
RBC: 4.97 x10E6/uL (ref 3.77–5.28)
RDW: 12.4 % (ref 11.7–15.4)
WBC: 10.7 10*3/uL (ref 3.4–10.8)

## 2021-11-01 LAB — COMPREHENSIVE METABOLIC PANEL
ALT: 3 IU/L (ref 0–32)
AST: 10 IU/L (ref 0–40)
Albumin/Globulin Ratio: 2 (ref 1.2–2.2)
Albumin: 4.2 g/dL (ref 3.7–4.7)
Alkaline Phosphatase: 76 IU/L (ref 44–121)
BUN/Creatinine Ratio: 18 (ref 12–28)
BUN: 20 mg/dL (ref 8–27)
Bilirubin Total: 0.4 mg/dL (ref 0.0–1.2)
CO2: 23 mmol/L (ref 20–29)
Calcium: 10 mg/dL (ref 8.7–10.3)
Chloride: 105 mmol/L (ref 96–106)
Creatinine, Ser: 1.12 mg/dL — ABNORMAL HIGH (ref 0.57–1.00)
Globulin, Total: 2.1 g/dL (ref 1.5–4.5)
Glucose: 87 mg/dL (ref 70–99)
Potassium: 3.9 mmol/L (ref 3.5–5.2)
Sodium: 143 mmol/L (ref 134–144)
Total Protein: 6.3 g/dL (ref 6.0–8.5)
eGFR: 50 mL/min/{1.73_m2} — ABNORMAL LOW (ref >59)

## 2021-11-01 LAB — LIPID PANEL W/O CHOL/HDL RATIO
Cholesterol, Total: 193 mg/dL (ref 100–199)
HDL: 52 mg/dL (ref >39)
LDL Chol Calc (NIH): 115 mg/dL — ABNORMAL HIGH (ref 0–99)
Triglycerides: 147 mg/dL (ref 0–149)
VLDL Cholesterol Cal: 26 mg/dL (ref 5–40)

## 2021-11-01 MED ORDER — SIMVASTATIN 20 MG PO TABS: 20.0000 mg | ORAL_TABLET | Freq: Every day | ORAL | 3 refills | Status: DC

## 2021-11-01 NOTE — Progress Notes (Signed)
Kidney function down just a bit but likely due to the prednisone and antiinflammatories will recheck in 1-2 months.  Increased zocor to 20mg  to get LDL under 100.

## 2021-11-06 DIAGNOSIS — M7138 Other bursal cyst, other site: Secondary | ICD-10-CM | POA: Diagnosis not present

## 2021-11-26 ENCOUNTER — Other Ambulatory Visit: Payer: Self-pay | Admitting: Physician Assistant

## 2021-11-26 DIAGNOSIS — I10 Essential (primary) hypertension: Secondary | ICD-10-CM

## 2021-11-26 DIAGNOSIS — E782 Mixed hyperlipidemia: Secondary | ICD-10-CM

## 2022-01-29 DIAGNOSIS — Z20822 Contact with and (suspected) exposure to covid-19: Secondary | ICD-10-CM | POA: Diagnosis not present

## 2022-02-19 ENCOUNTER — Ambulatory Visit: Payer: Medicare Other | Admitting: Rehabilitative and Restorative Service Providers"

## 2022-02-20 ENCOUNTER — Other Ambulatory Visit: Payer: Self-pay

## 2022-02-20 ENCOUNTER — Encounter: Payer: Self-pay | Admitting: Physical Therapy

## 2022-02-20 ENCOUNTER — Ambulatory Visit: Payer: Medicare Other | Attending: Neurology | Admitting: Physical Therapy

## 2022-02-20 DIAGNOSIS — R2689 Other abnormalities of gait and mobility: Secondary | ICD-10-CM | POA: Insufficient documentation

## 2022-02-20 DIAGNOSIS — M6281 Muscle weakness (generalized): Secondary | ICD-10-CM | POA: Insufficient documentation

## 2022-02-20 NOTE — Therapy (Signed)
[facility] [place], [place], [address] Phone: [phone]   Fax:  [phone]  Physical Therapy Evaluation  Patient Details  Name: Chelsey Estes MRN: [id number] Date of Birth: 07/26/1941 Referring Provider (PT): TAT, [place]   Encounter Date: 02/20/2022   PT End of Session - 02/20/22 1226     Visit Number 1    Number of Visits 6    Date for PT Re-Evaluation 04/03/22    Authorization Type Medicare    Authorization - Visit Number 1    Progress Note Due on Visit 10    PT Start Time 5638    PT Stop Time 1225    PT Time Calculation (min) 40 min    Activity Tolerance Patient tolerated treatment well    Behavior During Therapy [facility] for tasks assessed/performed             Past Medical History:  Diagnosis Date   Anxiety    Arthritis    Depression    Glaucoma    History of kidney stones    7 lithrotripsy   Hyperlipidemia    Hypertension    Parkinson disease ([place])     Past Surgical History:  Procedure Laterality Date   ABDOMINAL HYSTERECTOMY     ANTERIOR (CYSTOCELE) AND POSTERIOR REPAIR (RECTOCELE) WITH XENFORM GRAFT AND SACROSPINOUS FIXATION     CATARACT EXTRACTION, BILATERAL     INGUINAL HERNIA REPAIR Left    LUMBAR LAMINECTOMY/DECOMPRESSION MICRODISCECTOMY Right 10/12/2021   Procedure: Right Lumbar four-five Laminectomy for facet/synovial cyst;  Surgeon: Eustace Moore, MD;  Location: [place];  Service: Neurosurgery;  Laterality: Right;    There were no vitals filed for this visit.    Subjective Assessment - 02/20/22 1146     Subjective Pt has been diagnosed with Parkinsons disease for 2 years and MD recommended PT 08/2021. She then ended up getting a lumbar laminectomy 10/13/2021 and never attended physical therapy. She states she still has back pain and it was being managed by pain meds but then kidney function declined so she is no longer taking meds so she rests her back often. Pt feels her Rt  LE is "dragging" during gait, she has had falls in the past but none in the past few months. Gait without a device in the community, she uses a RW at home due to having 3 small dogs.    Pertinent History lumbar laminectomy    Limitations Walking;House hold activities    Patient Stated Goals improve balance    Currently in Pain? No/denies                [facility] PT Assessment - 02/20/22 0001       Assessment   Medical Diagnosis Parkinsons disease    Referring Provider (PT) TAT, REBE[place]    Onset Date/Surgical Date 12/20/19    Next MD Visit PRN    Prior Therapy PT for Parkinsons disease      Precautions   Precautions None      Restrictions   Weight Bearing Restrictions No      Balance Screen   Has the patient fallen in the past 6 months Yes    How many times? multiple    Has the patient had a decrease in activity level because of a fear of falling?  Yes    Is the patient reluctant to leave their home because of a fear of falling?  No      Home Environment  Additional Comments Pt lives with husband who is disabled. 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      Observation/Other Assessments   Focus on Therapeutic Outcomes (FOTO)  not appropriate for this diagnosis      ROM / Strength   AROM / PROM / Strength Strength      Strength   Overall Strength Comments Rt LE strength grossly 4/5, Lt LE strength grossly 4-/5      Transfers   Five time sit to stand comments  16.00 sec      Ambulation/Gait   Assistive device None    Gait Pattern Poor foot clearance - left;Poor foot clearance - right    Ambulation Surface Level      Standardized Balance Assessment   Standardized Balance Assessment Dynamic Gait Index      Dynamic Gait Index   Level Surface Mild Impairment    Change in Gait Speed Mild Impairment    Gait with Horizontal Head  Turns Severe Impairment    Gait with Vertical Head Turns Severe Impairment    Gait and Pivot Turn Mild Impairment    Step Over Obstacle Mild Impairment    Step Around Obstacles Moderate Impairment    Steps Mild Impairment    Total Score 11      High Level Balance   High Level Balance Comments backwards gait with Supervision, Tandem gait min A                        Objective measurements completed on examination: See above findings.            Balance Exercises - 02/20/22 0001       Balance Exercises: Standing   Tandem Gait Forward;Upper extremity support    Marching Head turns;Intermittent upper extremity assist;Solid surface;10 reps   head turns vertically and then horizontally               PT Education - 02/20/22 1215     Education Details PT POC and goals, HEP    Person(s) Educated Patient    Methods Explanation;Demonstration;Handout    Comprehension Verbalized understanding;Returned demonstration                 PT Long Term Goals - 02/20/22 1233       PT LONG TERM GOAL #1   Title Pt will be independent with HEP    Time 6    Period Weeks    Status New    Target Date 04/03/22      PT LONG TERM GOAL #2   Title Pt will decrease 5x STS to <= 13 seconds to demo improved LE strength    Time 6    Period Weeks    Status New    Target Date 04/03/22      PT LONG TERM GOAL #3   Title Pt will improve DGI to >= 20/24 to demo decreased risk of falls    Time 6    Period Weeks    Status New    Target Date 04/03/22                    Plan - 02/20/22 1228     Clinical Impression Statement Pt is an 81 y/o female referred for Parkinsons Disease.  pt presents with impaired gait and balance and decreased strength. Pt will benefit from skilled PT to address deficits and improve functional mobility and decrease risk of falls.    Personal Factors and Comorbidities Time since onset of injury/illness/exacerbation;Past/Current  Experience;Comorbidity 1    Examination-Activity Limitations Locomotion Level    Examination-Participation Restrictions Community Activity;Cleaning;Meal Prep    Stability/Clinical Decision Making Stable/Uncomplicated    Clinical Decision Making Low    Rehab Potential Good    PT Frequency 1x / week    PT Duration 6 weeks    PT Treatment/Interventions Gait training;Stair training;Neuromuscular re-education;Balance training;Therapeutic exercise;Therapeutic activities;DME Instruction;Patient/family education;Manual techniques    PT Next Visit Plan assess HEP, progress dyanamic gait and balance    PT Home Exercise Plan [email or web address]    Consulted and Agree with Plan of Care Patient             Patient will benefit from skilled therapeutic intervention in order to improve the following deficits and impairments:  Decreased balance, Decreased endurance, Difficulty walking, Abnormal gait, Decreased strength  Visit Diagnosis: Balance disorder - Plan: PT plan of care cert/re-cert  Muscle weakness (generalized) - Plan: PT plan of care cert/re-cert     Problem List Patient Active Problem List   Diagnosis Date Noted   Synovial cyst of lumbar facet joint 09/19/2021   DDD (degenerative disc disease), lumbar 04/11/2021   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 03/09/2021   Palpitations 09/05/2020   SOB (shortness of breath) 09/05/2020   Chest pain 09/05/2020   Mixed hyperlipidemia 06/06/2020   Primary insomnia 06/06/2020   Primary parkinsonism ([place]) 10/26/2019   Osteoporosis 09/15/2019   Blister of great toe of left foot 08/19/2019   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   Moderate episode of recurrent major depressive disorder ([place]) 02/09/2019   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 02/09/2019   Shuffling gait 02/09/2019   Balance problems 02/09/2019   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   Orthostatic hypotension 02/01/2019   Dizziness 02/01/2019   Primary osteoarthritis  of right foot 09/25/2018   Snoring 09/25/2018   Night terrors, adult 09/25/2018   Multiple falls 09/25/2018   Metatarsalgia of left foot 08/20/2018   Chronic foot pain, right 08/20/2018   Left hip pain 04/10/2017   History of shingles 04/08/2017   Adenomatous polyp 04/08/2017   Pure hypercholesterolemia 04/08/2017   History of kidney stones 04/08/2017   Essential hypertension 04/08/2017   Depression, recurrent ([place]) 04/08/2017   Anxiety 04/08/2017   Greater trochanteric bursitis of left hip 04/08/2017    Rebacca Votaw, PT 02/20/2022, 12:39 PM  [place], [place], [address] Phone: [phone]   Fax:  [phone]  Name: Chelsey Estes MRN: [id number] Date of Birth: 1941-10-10

## 2022-02-20 NOTE — Patient Instructions (Signed)
Access Code: [email or web address] ?URL: [email or web address] ?Date: 02/20/2022 ?Prepared by: Isabelle Course ? ?Exercises ?Standing March with Unilateral Counter Support - 1 x daily - 7 x weekly - 2 sets - 5 reps - 30 seconds hold ?Tandem Walking with Counter Support - 1 x daily - 7 x weekly - 3 sets - 10 reps ? ?

## 2022-02-26 ENCOUNTER — Ambulatory Visit: Payer: Medicare Other | Admitting: Rehabilitative and Restorative Service Providers"

## 2022-02-26 ENCOUNTER — Encounter: Payer: Self-pay | Admitting: Rehabilitative and Restorative Service Providers"

## 2022-02-26 ENCOUNTER — Other Ambulatory Visit: Payer: Self-pay

## 2022-02-26 DIAGNOSIS — M6281 Muscle weakness (generalized): Secondary | ICD-10-CM

## 2022-02-26 DIAGNOSIS — R2689 Other abnormalities of gait and mobility: Secondary | ICD-10-CM

## 2022-02-26 NOTE — Therapy (Signed)
[place] ?[facility] ?[place] ?[place], [place], [address] ?Phone: [phone]   Fax:  [phone] ? ?Physical Therapy Treatment ? ?Patient Details  ?Name: Chelsey Estes ?MRN: [id number] ?Date of Birth: 02-19-1941 ?Referring Provider (PT): TAT, [place] ? ? ?Encounter Date: 02/26/2022 ? ? PT End of Session - 02/26/22 0931   ? ? Visit Number 2   ? Number of Visits 6   ? Date for PT Re-Evaluation 04/03/22   ? Authorization Type Medicare   ? Authorization - Visit Number 2   ? Progress Note Due on Visit 10   ? PT Start Time 0930   ? PT Stop Time 1015   ? PT Time Calculation (min) 45 min   ? Activity Tolerance Patient tolerated treatment well   ? ?  ?  ? ?  ? ? ?Past Medical History:  ?Diagnosis Date  ? Anxiety   ? Arthritis   ? Depression   ? Glaucoma   ? History of kidney stones   ? 7 lithrotripsy  ? Hyperlipidemia   ? Hypertension   ? Parkinson disease ([place])   ? ? ?Past Surgical History:  ?Procedure Laterality Date  ? ABDOMINAL HYSTERECTOMY    ? ANTERIOR (CYSTOCELE) AND POSTERIOR REPAIR (RECTOCELE) WITH XENFORM GRAFT AND SACROSPINOUS FIXATION    ? CATARACT EXTRACTION, BILATERAL    ? INGUINAL HERNIA REPAIR Left   ? LUMBAR LAMINECTOMY/DECOMPRESSION MICRODISCECTOMY Right 10/12/2021  ? Procedure: Right Lumbar four-five Laminectomy for facet/synovial cyst;  Surgeon: Eustace Moore, MD;  Location: [facility];  Service: Neurosurgery;  Laterality: Right;  ? ? ?There were no vitals filed for this visit. ? ? Subjective Assessment - 02/26/22 0932   ? ? Subjective Doing her exercises at home. Can walk heel to toe but has to hold on. Still having some problems with her back but better following surgery. Balance has been off prior to surgery.   ? Currently in Pain? No/denies   ? ?  ?  ? ?  ? ? ? ? ? [facility] PT Assessment - 02/26/22 0001   ? ?  ? Assessment  ? Medical Diagnosis Parkinsons disease   ? Referring Provider (PT) TAT, [place]   ? Onset Date/Surgical Date 12/20/19   ? Next MD Visit  PRN   ? Prior Therapy PT for Parkinsons disease   ?  ? Posture/Postural Control  ? Posture Comments weight shifts to Lt in sit to stand and with wall squat   ?  ? Palpation  ? Palpation comment tightness posterior hip and hamstrings Rt >Lt   ? ?  ?  ? ?  ? ? ? ? ? ? ? ? ? ? ? ? ? ? ? ? [place] Adult PT Treatment/Exercise - 02/26/22 0001   ? ?  ? Knee/Hip Exercises: Stretches  ? Passive Hamstring Stretch Right;Left;2 reps;30 seconds   ? Passive Hamstring Stretch Limitations sitting hinged hip   ? Hip Flexor Stretch Right;Left;2 reps;30 seconds   ? Hip Flexor Stretch Limitations sitting   ?  ? Knee/Hip Exercises: Aerobic  ? Nustep L5 x 5 min   ?  ? Knee/Hip Exercises: Standing  ? Hip Abduction AROM;Stengthening;Right;10 reps;Knee straight   ? Hip Extension AROM;Stengthening;Right;Left;10 reps;Knee straight   ? Wall Squat 10 reps;5 seconds   ? SLS 10 sec hold x 3 reps each side; tandam stance 10 sec x 3 reps each side   ? Other Standing Knee Exercises standing back to wall pushing hips away from  wall and shoulders stay at wall 5 sec hold 2 sets of 5 reps; backward shoulder rolls standing tall core engaged x 10 reps   ? Other Standing Knee Exercises heel toe walking at counter followed by backward walking at counter VC to engage core; side steps raching up to tap cabinets each side x 5 reps ~ 10 feet   ?  ? Knee/Hip Exercises: Seated  ? Sit to Sand 5 reps;without UE support   2 sets hinged hip core engaged  ? ?  ?  ? ?  ? ? ? ? ? ? ? ? ? ? PT Education - 02/26/22 1002   ? ? Education Details HEP   ? Person(s) Educated Patient   ? Methods Explanation   ? Comprehension Verbalized understanding;Returned demonstration;Verbal cues required;Tactile cues required   ? ?  ?  ? ?  ? ? ? ? ? ? PT Long Term Goals - 02/20/22 1233   ? ?  ? PT LONG TERM GOAL #1  ? Title Pt will be independent with HEP   ? Time 6   ? Period Weeks   ? Status New   ? Target Date 04/03/22   ?  ? PT LONG TERM GOAL #2  ? Title Pt will decrease 5x STS to <= 13  seconds to demo improved LE strength   ? Time 6   ? Period Weeks   ? Status New   ? Target Date 04/03/22   ?  ? PT LONG TERM GOAL #3  ? Title Pt will improve DGI to >= 20/24 to demo decreased risk of falls   ? Time 6   ? Period Weeks   ? Status New   ? Target Date 04/03/22   ? ?  ?  ? ?  ? ? ? ? ? ? ? ? Plan - 02/26/22 0937   ? ? Clinical Impression Statement Patient continued with strengthening and balance activities. Note tightness Rt posterior hip and hamstrings. Added exercises for home program.   ? Rehab Potential Good   ? PT Frequency 1x / week   ? PT Duration 6 weeks   ? PT Treatment/Interventions Gait training;Stair training;Neuromuscular re-education;Balance training;Therapeutic exercise;Therapeutic activities;DME Instruction;Patient/family education;Manual techniques   ? PT Next Visit Plan progress with HEP, dyanamic gait and balance   ? PT Home Exercise Plan [email or web address]   ? Consulted and Agree with Plan of Care Patient   ? ?  ?  ? ?  ? ? ?Patient will benefit from skilled therapeutic intervention in order to improve the following deficits and impairments:    ? ?Visit Diagnosis: ?Balance disorder ? ?Muscle weakness (generalized) ? ? ? ? ?Problem List ?Patient Active Problem List  ? Diagnosis Date Noted  ? Synovial cyst of lumbar facet joint 09/19/2021  ? DDD (degenerative disc disease), lumbar 04/11/2021  ? 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 03/09/2021  ? Palpitations 09/05/2020  ? SOB (shortness of breath) 09/05/2020  ? Chest pain 09/05/2020  ? Mixed hyperlipidemia 06/06/2020  ? Primary insomnia 06/06/2020  ? Primary parkinsonism ([place]) 10/26/2019  ? Osteoporosis 09/15/2019  ? Blister of great toe of left foot 08/19/2019  ? 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 Moderate episode of recurrent major depressive disorder ([place]) 02/09/2019  ? 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 Shuffling gait 02/09/2019  ? Balance problems 02/09/2019  ? 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  ?  Orthostatic hypotension 02/01/2019  ? Dizziness 02/01/2019  ? Primary osteoarthritis of  right foot 09/25/2018  ? Snoring 09/25/2018  ? Night terrors, adult 09/25/2018  ? Multiple falls 09/25/2018  ? Metatarsalgia of left foot 08/20/2018  ? Chronic foot pain, right 08/20/2018  ? Left hip pain 04/10/2017  ? History of shingles 04/08/2017  ? Adenomatous polyp 04/08/2017  ? Pure hypercholesterolemia 04/08/2017  ? History of kidney stones 04/08/2017  ? Essential hypertension 04/08/2017  ? Depression, recurrent ([place]) 04/08/2017  ? Anxiety 04/08/2017  ? Greater trochanteric bursitis of left hip 04/08/2017  ? ? ?Everardo All, PT, MPH ?02/26/2022, 10:21 AM ? ?[place] ?[facility] ?[place] ?[place], [place], [address] ?Phone: [phone]   Fax:  [phone] ? ?Name: Ashleyanne Hemmingway ?MRN: [id number] ?Date of Birth: [birth year] ? ? ? ?

## 2022-02-26 NOTE — Patient Instructions (Signed)
Access Code: [email or web address] ?URL: [email or web address] ?Date: 02/26/2022 ?Prepared by: Gillermo Murdoch ? ?Exercises ?Standing March with Unilateral Counter Support - 1 x daily - 7 x weekly - 2 sets - 5 reps - 30 seconds hold ?Tandem Walking with Counter Support - 1 x daily - 7 x weekly - 3 sets - 10 reps ?Seated Hip Flexor Stretch - 2 x daily - 7 x weekly - 1 sets - 3 reps - 30 sec hold ?Standing Backward Shoulder Rolls - 2 x daily - 7 x weekly - 1 sets - 10 reps - 1-2 sec hold ?Wall Quarter Squat - 2 x daily - 7 x weekly - 1-2 sets - 10 reps - 5-10 sec hold ?Standing Piriformis Release with Ball at [employer] - 2 x daily - 7 x weekly - 30-60 sec hold ?Seated Hamstring Stretch - 2 x daily - 7 x weekly - 1 sets - 3 reps - 30 sec hold ? ?

## 2022-03-05 ENCOUNTER — Ambulatory Visit: Payer: Medicare Other | Admitting: Physical Therapy

## 2022-03-05 ENCOUNTER — Other Ambulatory Visit: Payer: Self-pay

## 2022-03-05 DIAGNOSIS — M6281 Muscle weakness (generalized): Secondary | ICD-10-CM

## 2022-03-05 DIAGNOSIS — R2689 Other abnormalities of gait and mobility: Secondary | ICD-10-CM | POA: Diagnosis not present

## 2022-03-05 NOTE — Therapy (Signed)
?[facility] ?[place] ?[place], [place], [address] ?Phone: [phone]   Fax:  [phone] ? ?Physical Therapy Treatment ? ?Patient Details  ?Name: Chelsey Estes ?MRN: [id number] ?Date of Birth: 07-19-1941 ?Referring Provider (PT): TAT, [place] ? ? ?Encounter Date: 03/05/2022 ? ? PT End of Session - 03/05/22 1144   ? ? Visit Number 3   ? Number of Visits 6   ? Date for PT Re-Evaluation 04/03/22   ? Authorization Type Medicare   ? Authorization - Visit Number 3   ? Progress Note Due on Visit 10   ? PT Start Time 1100   ? PT Stop Time 7510   ? PT Time Calculation (min) 42 min   ? Activity Tolerance Patient tolerated treatment well   ? Behavior During Therapy [facility] for tasks assessed/performed   ? ?  ?  ? ?  ? ? ?Past Medical History:  ?Diagnosis Date  ? Anxiety   ? Arthritis   ? Depression   ? Glaucoma   ? History of kidney stones   ? 7 lithrotripsy  ? Hyperlipidemia   ? Hypertension   ? Parkinson disease ([place])   ? ? ?Past Surgical History:  ?Procedure Laterality Date  ? ABDOMINAL HYSTERECTOMY    ? ANTERIOR (CYSTOCELE) AND POSTERIOR REPAIR (RECTOCELE) WITH XENFORM GRAFT AND SACROSPINOUS FIXATION    ? CATARACT EXTRACTION, BILATERAL    ? INGUINAL HERNIA REPAIR Left   ? LUMBAR LAMINECTOMY/DECOMPRESSION MICRODISCECTOMY Right 10/12/2021  ? Procedure: Right Lumbar four-five Laminectomy for facet/synovial cyst;  Surgeon: Eustace Moore, MD;  Location: [place];  Service: Neurosurgery;  Laterality: Right;  ? ? ?There were no vitals filed for this visit. ? ? Subjective Assessment - 03/05/22 1109   ? ? Subjective Pt states she is having some Rt hip/ITB pain today. She states it "comes on every now and then". Pt states she is more stiff today   ? Patient Stated Goals improve balance   ? Currently in Pain? Yes   ? Pain Score 2    ? Pain Location Leg   ? Pain Orientation Right   ? Pain Descriptors / Indicators --   stiff  ? ?  ?  ? ?  ? ? ? ? ? [facility] PT Assessment - 03/05/22 0001    ? ?  ? Assessment  ? Medical Diagnosis Parkinsons disease   ? Referring Provider (PT) TAT, [place]   ? Onset Date/Surgical Date 12/20/19   ? Next MD Visit PRN   ? Prior Therapy PT for Parkinsons disease   ?  ? Transfers  ? Five time sit to stand comments  14.5 seconds   ? ?  ?  ? ?  ? ? ? ? ? ? ? ? ? ? ? ? ? ? ? ? [facility] Adult PT Treatment/Exercise - 03/05/22 0001   ? ?  ? Knee/Hip Exercises: Stretches  ? Passive Hamstring Stretch Right;Left;2 reps;30 seconds   ? Passive Hamstring Stretch Limitations sitting hinged hip   ? Hip Flexor Stretch Right;Left;2 reps;30 seconds   ? Hip Flexor Stretch Limitations sitting   ?  ? Knee/Hip Exercises: Aerobic  ? Nustep L5 x 5 min   ?  ? Knee/Hip Exercises: Standing  ? Hip Abduction 20 reps   ? Abduction Limitations bilat   ? Hip Extension 20 reps   ? Extension Limitations bilat   ? Wall Squat 10 reps;5 seconds   ? SLS SLS 2 x 15 bilat,  tandem 2 x 20 bilat both with intermittent UE support   ? Other Standing Knee Exercises standing back to wall pushing hips away from wall and shoulders stay at wall 5 sec hold x 10   ? Other Standing Knee Exercises tandem gait, backward gait, side stepping all 3 x 20' all with intermittent UE support   ?  ? Knee/Hip Exercises: Seated  ? Sit to Sand 5 reps;without UE support   2 sets  ? ?  ?  ? ?  ? ? ? ? ? ? ? ? ? ? ? ? ? ? ? PT Long Term Goals - 02/20/22 1233   ? ?  ? PT LONG TERM GOAL #1  ? Title Pt will be independent with HEP   ? Time 6   ? Period Weeks   ? Status New   ? Target Date 04/03/22   ?  ? PT LONG TERM GOAL #2  ? Title Pt will decrease 5x STS to <= 13 seconds to demo improved LE strength   ? Time 6   ? Period Weeks   ? Status New   ? Target Date 04/03/22   ?  ? PT LONG TERM GOAL #3  ? Title Pt will improve DGI to >= 20/24 to demo decreased risk of falls   ? Time 6   ? Period Weeks   ? Status New   ? Target Date 04/03/22   ? ?  ?  ? ?  ? ? ? ? ? ? ? ? Plan - 03/05/22 1144   ? ? Clinical Impression Statement Pt with good tolerance to  exercise progression. Improving balance with SLS and tandem stance. Will continue to benefit from strength and balance training   ? PT Next Visit Plan progress with HEP, dyanamic gait and balance   ? PT Home Exercise Plan [email or web address]   ? Consulted and Agree with Plan of Care Patient   ? ?  ?  ? ?  ? ? ?Patient will benefit from skilled therapeutic intervention in order to improve the following deficits and impairments:    ? ?Visit Diagnosis: ?Balance disorder ? ?Muscle weakness (generalized) ? ? ? ? ?Problem List ?Patient Active Problem List  ? Diagnosis Date Noted  ? Synovial cyst of lumbar facet joint 09/19/2021  ? DDD (degenerative disc disease), lumbar 04/11/2021  ? 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 03/09/2021  ? Palpitations 09/05/2020  ? SOB (shortness of breath) 09/05/2020  ? Chest pain 09/05/2020  ? Mixed hyperlipidemia 06/06/2020  ? Primary insomnia 06/06/2020  ? Primary parkinsonism ([place]) 10/26/2019  ? Osteoporosis 09/15/2019  ? Blister of great toe of left foot 08/19/2019  ? 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 Moderate episode of recurrent major depressive disorder ([place]) 02/09/2019  ? 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 Shuffling gait 02/09/2019  ? Balance problems 02/09/2019  ? 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  ? Orthostatic hypotension 02/01/2019  ? Dizziness 02/01/2019  ? Primary osteoarthritis of right foot 09/25/2018  ? Snoring 09/25/2018  ? Night terrors, adult 09/25/2018  ? Multiple falls 09/25/2018  ? Metatarsalgia of left foot 08/20/2018  ? Chronic foot pain, right 08/20/2018  ? Left hip pain 04/10/2017  ? History of shingles 04/08/2017  ? Adenomatous polyp 04/08/2017  ? Pure hypercholesterolemia 04/08/2017  ? History of kidney stones 04/08/2017  ? Essential hypertension 04/08/2017  ? Depression, recurrent ([place]) 04/08/2017  ? Anxiety 04/08/2017  ? Greater trochanteric bursitis of left hip 04/08/2017  ? ? ?  Saran Laviolette, PT ?03/05/2022, 11:45 AM ? ?[facility] ?[facility] ?[place] ?[place], [place], [address] ?Phone: [phone]   Fax:  [phone] ? ?Name: Lavra Imler ?MRN: [id number] ?Date of Birth: [birth year] ? ? ? ?

## 2022-03-12 ENCOUNTER — Other Ambulatory Visit: Payer: Self-pay

## 2022-03-12 ENCOUNTER — Ambulatory Visit: Payer: Medicare Other | Admitting: Physical Therapy

## 2022-03-12 DIAGNOSIS — R2689 Other abnormalities of gait and mobility: Secondary | ICD-10-CM | POA: Diagnosis not present

## 2022-03-12 DIAGNOSIS — M6281 Muscle weakness (generalized): Secondary | ICD-10-CM

## 2022-03-12 NOTE — Patient Instructions (Signed)
Access Code: [email or web address] ?URL: [email or web address] ?Date: 03/12/2022 ?Prepared by: Isabelle Course ? ?Exercises ?- Standing March with Unilateral Counter Support  - 1 x daily - 7 x weekly - 2 sets - 5 reps - 30 seconds hold ?- Tandem Walking with Counter Support  - 1 x daily - 7 x weekly - 3 sets - 10 reps ?- Seated Hip Flexor Stretch  - 2 x daily - 7 x weekly - 1 sets - 3 reps - 30 sec  hold ?- Seated Hamstring Stretch  - 2 x daily - 7 x weekly - 1 sets - 3 reps - 30 sec  hold ?- Wall Quarter Squat  - 2 x daily - 7 x weekly - 1-2 sets - 10 reps - 5-10 sec  hold ?- Standing Hip Abduction with Counter Support  - 1 x daily - 7 x weekly - 3 sets - 10 reps ?- Standing Hip Extension with Unilateral Counter Support  - 1 x daily - 7 x weekly - 3 sets - 10 reps ?- Heel Toe Raises with Unilateral Counter Support  - 1 x daily - 7 x weekly - 3 sets - 10 reps ?- Standing Single Leg Stance with Counter Support  - 1 x daily - 7 x weekly - 1 sets - 10 reps - 20-30 seconds hold ?

## 2022-03-12 NOTE — Therapy (Signed)
[facility] ?[place] ?[place], [place], [address] ?Phone: [phone]   Fax:  [phone] ? ?Physical Therapy Treatment and Discharge ? ?Patient Details  ?Name: Chelsey Estes ?MRN: [id number] ?Date of Birth: 10/06/1941 ?Referring Provider (PT): TAT, [place] ? ? ?Encounter Date: 03/12/2022 ? ? PT End of Session - 03/12/22 1133   ? ? Visit Number 4   ? Number of Visits 6   ? Date for PT Re-Evaluation 04/03/22   ? Authorization Type Medicare   ? Authorization - Visit Number 4   ? Progress Note Due on Visit 10   ? PT Start Time 1100   ? PT Stop Time 3500   ? PT Time Calculation (min) 32 min   ? Activity Tolerance Patient tolerated treatment well   ? Behavior During Therapy [facility] for tasks assessed/performed   ? ?  ?  ? ?  ? ? ?Past Medical History:  ?Diagnosis Date  ? Anxiety   ? Arthritis   ? Depression   ? Glaucoma   ? History of kidney stones   ? 7 lithrotripsy  ? Hyperlipidemia   ? Hypertension   ? Parkinson disease ([place])   ? ? ?Past Surgical History:  ?Procedure Laterality Date  ? ABDOMINAL HYSTERECTOMY    ? ANTERIOR (CYSTOCELE) AND POSTERIOR REPAIR (RECTOCELE) WITH XENFORM GRAFT AND SACROSPINOUS FIXATION    ? CATARACT EXTRACTION, BILATERAL    ? INGUINAL HERNIA REPAIR Left   ? LUMBAR LAMINECTOMY/DECOMPRESSION MICRODISCECTOMY Right 10/12/2021  ? Procedure: Right Lumbar four-five Laminectomy for facet/synovial cyst;  Surgeon: Eustace Moore, MD;  Location: [facility];  Service: Neurosurgery;  Laterality: Right;  ? ? ?There were no vitals filed for this visit. ? ? Subjective Assessment - 03/12/22 1101   ? ? Subjective Pt states her back feels "tired" today. She states she has been having more stress at home   ? Patient Stated Goals improve balance   ? Currently in Pain? No/denies   ? ?  ?  ? ?  ? ? ? ? ? [facility] PT Assessment - 03/12/22 0001   ? ?  ? Assessment  ? Medical Diagnosis Parkinsons disease   ? Referring Provider (PT) TAT, [place]   ? Onset Date/Surgical Date  12/20/19   ? Next MD Visit PRN   ? Prior Therapy PT for Parkinsons disease   ?  ? Transfers  ? Five time sit to stand comments  12.0 seconds   ?  ? Dynamic Gait Index  ? Level Surface Mild Impairment   ? Change in Gait Speed Mild Impairment   ? Gait with Horizontal Head Turns Moderate Impairment   ? Gait with Vertical Head Turns Mild Impairment   ? Gait and Pivot Turn Mild Impairment   ? Step Over Obstacle Moderate Impairment   ? Step Around Obstacles Mild Impairment   ? Steps Mild Impairment   ? Total Score 14   ? ?  ?  ? ?  ? ? ? ? ? ? ? ? ? ? ? ? ? ? ? ? [facility] Adult PT Treatment/Exercise - 03/12/22 0001   ? ?  ? Knee/Hip Exercises: Stretches  ? Passive Hamstring Stretch Right;Left;2 reps;30 seconds   ? Passive Hamstring Stretch Limitations sitting hinged hip   ? Hip Flexor Stretch Right;Left;2 reps;30 seconds   ? Hip Flexor Stretch Limitations sitting   ?  ? Knee/Hip Exercises: Standing  ? Heel Raises Limitations heel/toe raises x 20   ? Hip  Abduction 20 reps   ? Abduction Limitations bilat   ? Hip Extension 20 reps   ? Extension Limitations bilat   ? Wall Squat 10 reps;5 seconds   ? SLS SLS 2 x 15 bilat, tandem 2 x 20 bilat both with intermittent UE support   ?  ? Knee/Hip Exercises: Seated  ? Sit to Sand 5 reps;without UE support   2 sets  ? ?  ?  ? ?  ? ? ? ? ? ? ? ? ? ? PT Education - 03/12/22 1133   ? ? Education Details plan for d/c, updated HEP   ? Person(s) Educated Patient   ? Methods Explanation;Demonstration;Handout   ? Comprehension Verbalized understanding;Returned demonstration   ? ?  ?  ? ?  ? ? ? ? ? ? PT Long Term Goals - 03/12/22 1124   ? ?  ? PT LONG TERM GOAL #1  ? Title Pt will be independent with HEP   ? Status Achieved   ?  ? PT LONG TERM GOAL #2  ? Title Pt will decrease 5x STS to <= 13 seconds to demo improved LE strength   ? Baseline 12 sec   ? Status Achieved   ?  ? PT LONG TERM GOAL #3  ? Title Pt will improve DGI to >= 20/24 to demo decreased risk of falls   ? Baseline 14   ? Status  Partially Met   ? ?  ?  ? ?  ? ? ? ? ? ? ? ? Plan - 03/12/22 1134   ? ? Clinical Impression Statement Pt has met 2/3 goals and has improved LE strength and balance. Pt requests to d/c to HEP.   ? PT Next Visit Plan d/c   ? PT Home Exercise Plan [id number]   ? Consulted and Agree with Plan of Care Patient   ? ?  ?  ? ?  ? ? ?Patient will benefit from skilled therapeutic intervention in order to improve the following deficits and impairments:    ? ?Visit Diagnosis: ?Balance disorder ? ?Muscle weakness (generalized) ? ? ? ? ?Problem List ?Patient Active Problem List  ? Diagnosis Date Noted  ? Synovial cyst of lumbar facet joint 09/19/2021  ? DDD (degenerative disc disease), lumbar 04/11/2021  ? 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 03/09/2021  ? Palpitations 09/05/2020  ? SOB (shortness of breath) 09/05/2020  ? Chest pain 09/05/2020  ? Mixed hyperlipidemia 06/06/2020  ? Primary insomnia 06/06/2020  ? Primary parkinsonism ([place]) 10/26/2019  ? Osteoporosis 09/15/2019  ? Blister of great toe of left foot 08/19/2019  ? 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 Moderate episode of recurrent major depressive disorder ([place]) 02/09/2019  ? 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 Shuffling gait 02/09/2019  ? Balance problems 02/09/2019  ? 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  ? Orthostatic hypotension 02/01/2019  ? Dizziness 02/01/2019  ? Primary osteoarthritis of right foot 09/25/2018  ? Snoring 09/25/2018  ? Night terrors, adult 09/25/2018  ? Multiple falls 09/25/2018  ? Metatarsalgia of left foot 08/20/2018  ? Chronic foot pain, right 08/20/2018  ? Left hip pain 04/10/2017  ? History of shingles 04/08/2017  ? Adenomatous polyp 04/08/2017  ? Pure hypercholesterolemia 04/08/2017  ? History of kidney stones 04/08/2017  ? Essential hypertension 04/08/2017  ? Depression, recurrent ([place]) 04/08/2017  ? Anxiety 04/08/2017  ? Greater trochanteric bursitis of left hip 04/08/2017  ? ?PHYSICAL THERAPY DISCHARGE  SUMMARY ? ?Visits from [facility]  of Care: 4 ? ?Current functional level related to goals / functional outcomes: ?Improved strength and balance ?  ?Remaining deficits: ?See above ?  ?Education / Equipment: ?HEP  ? ?Patient agrees to discharge. Patient goals were partially met. Patient is being discharged due to being pleased with the current functional level. ? ?Chinedum Vanhouten, PT ?03/12/2022, 11:35 AM ? ?[place] ?[facility] ?[place] ?[place], [place], [address] ?Phone: [phone]   Fax:  [phone] ? ?Name: Arabela Basaldua ?MRN: [id number] ?Date of Birth: Jan 25, 1941 ? ? ? ?

## 2022-03-19 ENCOUNTER — Encounter: Payer: Medicare Other | Admitting: Physical Therapy

## 2022-04-05 DIAGNOSIS — Z20822 Contact with and (suspected) exposure to covid-19: Secondary | ICD-10-CM | POA: Diagnosis not present

## 2022-04-09 ENCOUNTER — Ambulatory Visit: Payer: Medicare Other | Admitting: Physician Assistant

## 2022-04-16 ENCOUNTER — Ambulatory Visit (INDEPENDENT_AMBULATORY_CARE_PROVIDER_SITE_OTHER): Payer: Medicare Other | Admitting: Physician Assistant

## 2022-04-16 ENCOUNTER — Encounter: Payer: Self-pay | Admitting: Physician Assistant

## 2022-04-16 VITALS — BP 126/71 | HR 65 | Ht 61.0 in | Wt 137.0 lb

## 2022-04-16 DIAGNOSIS — I10 Essential (primary) hypertension: Secondary | ICD-10-CM | POA: Diagnosis not present

## 2022-04-16 DIAGNOSIS — Z79899 Other long term (current) drug therapy: Secondary | ICD-10-CM

## 2022-04-16 DIAGNOSIS — Z0000 Encounter for general adult medical examination without abnormal findings: Secondary | ICD-10-CM | POA: Insufficient documentation

## 2022-04-16 DIAGNOSIS — E782 Mixed hyperlipidemia: Secondary | ICD-10-CM

## 2022-04-16 DIAGNOSIS — F5101 Primary insomnia: Secondary | ICD-10-CM | POA: Diagnosis not present

## 2022-04-16 DIAGNOSIS — Z1231 Encounter for screening mammogram for malignant neoplasm of breast: Secondary | ICD-10-CM

## 2022-04-16 DIAGNOSIS — F339 Major depressive disorder, recurrent, unspecified: Secondary | ICD-10-CM

## 2022-04-16 DIAGNOSIS — F419 Anxiety disorder, unspecified: Secondary | ICD-10-CM | POA: Diagnosis not present

## 2022-04-16 DIAGNOSIS — R944 Abnormal results of kidney function studies: Secondary | ICD-10-CM

## 2022-04-16 MED ORDER — BUPROPION HCL ER (XL) 300 MG PO TB24: ORAL_TABLET | ORAL | 1 refills | Status: DC

## 2022-04-16 NOTE — Progress Notes (Signed)
? ?Established Patient Office Visit ? ?Subjective   ?Patient ID: Chelsey Estes, female    DOB: [birth year]  Age: 81 y.o. MRN: [id number] ? ?Chief Complaint  ?Patient presents with  ? Follow-up  ? ? ?HPI ?Pt is a 81 yo obese female with HTN, parkinsonism, HLD, MDD, anxiety who presents to the clinic for follow up.  ? ?She was having lots of low back pain and leg weakness and falls but had surgery on her low back and been much better. She is not taking any pain medication. She finished PT. She is doing great. No falls since surgery.  ? ?Sleeping has improved and not on any medications.  ? ?Patient Active Problem List  ? Diagnosis Date Noted  ? Decreased GFR 04/17/2022  ? Medication management 04/16/2022  ? Synovial cyst of lumbar facet joint 09/19/2021  ? DDD (degenerative disc disease), lumbar 04/11/2021  ? 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 03/09/2021  ? Palpitations 09/05/2020  ? SOB (shortness of breath) 09/05/2020  ? Chest pain 09/05/2020  ? Mixed hyperlipidemia 06/06/2020  ? Primary insomnia 06/06/2020  ? Primary parkinsonism ([place]) 10/26/2019  ? Osteoporosis 09/15/2019  ? Blister of great toe of left foot 08/19/2019  ? 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 Moderate episode of recurrent major depressive disorder ([place]) 02/09/2019  ? 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 Shuffling gait 02/09/2019  ? Balance problems 02/09/2019  ? 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  ? Orthostatic hypotension 02/01/2019  ? Dizziness 02/01/2019  ? Primary osteoarthritis of right foot 09/25/2018  ? Snoring 09/25/2018  ? Night terrors, adult 09/25/2018  ? Multiple falls 09/25/2018  ? Metatarsalgia of left foot 08/20/2018  ? Chronic foot pain, right 08/20/2018  ? Left hip pain 04/10/2017  ? History of shingles 04/08/2017  ? Adenomatous polyp 04/08/2017  ? Pure hypercholesterolemia 04/08/2017  ? History of kidney stones 04/08/2017  ? Essential hypertension 04/08/2017  ? Depression, recurrent ([place])  04/08/2017  ? Anxiety 04/08/2017  ? Greater trochanteric bursitis of left hip 04/08/2017  ? ?Past Medical History:  ?Diagnosis Date  ? Anxiety   ? Arthritis   ? Depression   ? Glaucoma   ? History of kidney stones   ? 7 lithrotripsy  ? Hyperlipidemia   ? Hypertension   ? Parkinson disease ([place])   ? ?Family History  ?Problem Relation Age of Onset  ? Alcohol abuse Father   ? Cancer Father   ?     esophageal  ? Stroke Maternal Grandfather   ? Alcohol abuse Paternal Grandfather   ? Alzheimer's disease Mother   ? ?Allergies  ?Allergen Reactions  ? Trazodone And Nefazodone Nausea Only and Other (See Comments)  ?  Nausea/dizzines/cramps ?  ? ?  ? ?ROS ?See HPI.  ?  ?Objective:  ?  ? ?BP 126/71   Pulse 65   Ht '5\' 1"'$  (1.549 m)   Wt 137 lb (62.1 kg)   SpO2 100%   BMI 25.89 kg/m?  ?BP Readings from Last 3 Encounters:  ?04/16/22 126/71  ?10/12/21 (!) 152/70  ?10/09/21 128/68  ? ?Wt Readings from Last 3 Encounters:  ?04/16/22 137 lb (62.1 kg)  ?10/12/21 141 lb (64 kg)  ?10/09/21 141 lb (64 kg)  ?.. ? ?  04/17/2022  ?  9:20 AM 10/24/2021  ?  8:09 AM 10/09/2021  ? 10:46 AM 04/09/2021  ? 11:20 AM 09/05/2020  ? 10:46 AM  ?Depression screen PHQ 2/9  ?Decreased Interest 0 1 0 3 1  ?  Down, Depressed, Hopeless 1 1 0 1 1  ?PHQ - 2 Score 1 2 0 4 2  ?Altered sleeping 0 3 0 3 3  ?Tired, decreased energy '1 1 3 3 1  '$ ?Change in appetite 0 1 0 0 1  ?Feeling bad or failure about yourself  0 1 0 0 1  ?Trouble concentrating 0 1 0 1 1  ?Moving slowly or fidgety/restless 0 3 0 3 1  ?Suicidal thoughts 0 0 0 0 0  ?PHQ-9 Score '2 12 3 14 10  '$ ?Difficult doing work/chores Not difficult at all Somewhat difficult Not difficult at all Very difficult Somewhat difficult  ? ?.. ? ?  04/17/2022  ?  9:20 AM 04/09/2021  ? 11:21 AM 09/05/2020  ? 10:47 AM 03/15/2019  ? 10:47 AM  ?GAD 7 : Generalized Anxiety Score  ?Nervous, Anxious, on Edge '1 3 3 1  '$ ?Control/stop worrying 0 '3 3 2  '$ ?Worry too much - different things '1 3 3 '$ 0  ?Trouble relaxing 0 3 3 0  ?Restless 0 3 3 0   ?Easily annoyed or irritable 0 '1 3 1  '$ ?Afraid - awful might happen 0 1 1 0  ?Total GAD 7 Score '2 17 19 4  '$ ?Anxiety Difficulty Not difficult at all Very difficult Somewhat difficult Not difficult at all  ? ? ? ?  ? ?Physical Exam ?Vitals reviewed.  ?Constitutional:   ?   Appearance: Normal appearance. She is obese.  ?HENT:  ?   Head: Normocephalic.  ?Neck:  ?   Vascular: No carotid bruit.  ?Cardiovascular:  ?   Rate and Rhythm: Normal rate and regular rhythm.  ?   Pulses: Normal pulses.  ?Pulmonary:  ?   Effort: Pulmonary effort is normal.  ?Musculoskeletal:  ?   Right lower leg: No edema.  ?   Left lower leg: No edema.  ?Lymphadenopathy:  ?   Cervical: No cervical adenopathy.  ?Neurological:  ?   General: No focal deficit present.  ?   Mental Status: She is alert and oriented to person, place, and time.  ?Psychiatric:     ?   Mood and Affect: Mood normal.  ? ? ? ? ?  ?Assessment & Plan:  ?..Xoe was seen today for follow-up. ? ?Diagnoses and all orders for this visit: ? ?Essential hypertension ?-     COMPLETE METABOLIC PANEL WITH GFR ?-     lisinopril (ZESTRIL) 5 MG tablet; TAKE 1 TABLET BY MOUTH TWICE DAILY ? ?Encounter for screening mammogram for malignant neoplasm of breast ?-     MM 3D SCREEN BREAST BILATERAL ? ?Anxiety ?-     buPROPion (WELLBUTRIN XL) 300 MG 24 hr tablet; Take one tablet daily. ? ?Depression, recurrent ([place]) ?-     buPROPion (WELLBUTRIN XL) 300 MG 24 hr tablet; Take one tablet daily. ? ?Medication management ? ?Decreased GFR ? ? ?BP and vitals look great ?Refilled lisinopril ?Cmp ordered ? ?Mammogram due ?Dexa ordered as well ?Pt aware to get both ? ?PHQ/GAD no concerns ?Wellbutrin refilled ?Cmp ordered ? ?Not on any pain medication.  ?Back pain has improved significantly since surgery ? ?Sleep has improved. Not on any medications.  ? ? ? ? ?Return in about 6 months (around 10/17/2022).  ? ? ?Iran Planas, PA-C ? ?

## 2022-04-17 DIAGNOSIS — R944 Abnormal results of kidney function studies: Secondary | ICD-10-CM | POA: Insufficient documentation

## 2022-04-17 LAB — COMPLETE METABOLIC PANEL WITH GFR
AG Ratio: 1.6 (calc) (ref 1.0–2.5)
ALT: <3 U/L — ABNORMAL LOW (ref 6–29)
AST: 9 U/L — ABNORMAL LOW (ref 10–35)
Albumin: 4 g/dL (ref 3.6–5.1)
Alkaline phosphatase (APISO): 82 U/L (ref 37–153)
BUN: 21 mg/dL (ref 7–25)
CO2: 28 mmol/L (ref 20–32)
Calcium: 10.1 mg/dL (ref 8.6–10.4)
Chloride: 106 mmol/L (ref 98–110)
Creat: 0.91 mg/dL (ref 0.60–0.95)
Globulin: 2.5 g/dL (calc) (ref 1.9–3.7)
Glucose, Bld: 90 mg/dL (ref 65–99)
Potassium: 4.7 mmol/L (ref 3.5–5.3)
Sodium: 140 mmol/L (ref 135–146)
Total Bilirubin: 0.4 mg/dL (ref 0.2–1.2)
Total Protein: 6.5 g/dL (ref 6.1–8.1)
eGFR: 63 mL/min/{1.73_m2} (ref >=60)

## 2022-04-17 MED ORDER — LISINOPRIL 5 MG PO TABS: ORAL_TABLET | ORAL | 1 refills | Status: DC

## 2022-04-17 NOTE — Progress Notes (Signed)
Labs look great. Kidney function has improved!

## 2022-05-16 ENCOUNTER — Ambulatory Visit (INDEPENDENT_AMBULATORY_CARE_PROVIDER_SITE_OTHER): Payer: Medicare Other

## 2022-05-16 DIAGNOSIS — Z1231 Encounter for screening mammogram for malignant neoplasm of breast: Secondary | ICD-10-CM | POA: Diagnosis not present

## 2022-05-17 NOTE — Progress Notes (Signed)
Normal mammogram. Follow up in 1 year.

## 2022-06-19 ENCOUNTER — Other Ambulatory Visit: Payer: Self-pay | Admitting: Neurology

## 2022-06-19 DIAGNOSIS — G20 Parkinson's disease: Secondary | ICD-10-CM

## 2022-06-21 ENCOUNTER — Other Ambulatory Visit: Payer: Self-pay

## 2022-06-21 DIAGNOSIS — K5730 Diverticulosis of large intestine without perforation or abscess without bleeding: Secondary | ICD-10-CM | POA: Diagnosis not present

## 2022-06-21 DIAGNOSIS — G20 Parkinson's disease: Secondary | ICD-10-CM

## 2022-06-21 DIAGNOSIS — N200 Calculus of kidney: Secondary | ICD-10-CM | POA: Diagnosis not present

## 2022-06-21 MED ORDER — CARBIDOPA-LEVODOPA 25-100 MG PO TABS: 1.0000 | ORAL_TABLET | Freq: Three times a day (TID) | ORAL | 0 refills | Status: DC

## 2022-06-26 ENCOUNTER — Encounter: Payer: Self-pay | Admitting: Physician Assistant

## 2022-07-03 ENCOUNTER — Ambulatory Visit (INDEPENDENT_AMBULATORY_CARE_PROVIDER_SITE_OTHER): Payer: Medicare Other | Admitting: Physician Assistant

## 2022-07-03 ENCOUNTER — Encounter: Payer: Self-pay | Admitting: Physician Assistant

## 2022-07-03 VITALS — BP 137/69 | HR 74 | Ht 61.0 in | Wt 131.0 lb

## 2022-07-03 DIAGNOSIS — H9122 Sudden idiopathic hearing loss, left ear: Secondary | ICD-10-CM

## 2022-07-03 DIAGNOSIS — F5101 Primary insomnia: Secondary | ICD-10-CM | POA: Diagnosis not present

## 2022-07-03 DIAGNOSIS — H9312 Tinnitus, left ear: Secondary | ICD-10-CM | POA: Diagnosis not present

## 2022-07-03 MED ORDER — BELSOMRA 10 MG PO TABS: 10.0000 mg | ORAL_TABLET | Freq: Every day | ORAL | 1 refills | Status: DC

## 2022-07-03 NOTE — Patient Instructions (Signed)
Left ear hearing loss Will send to ENT

## 2022-07-03 NOTE — Progress Notes (Signed)
Acute Office Visit  Subjective:     Patient ID: Chelsey Estes, female    DOB: 12-28-1940, 81 y.o.   MRN: [id number]  Chief Complaint  Patient presents with   Ear Problem    HPI Patient is in today for left ear hearing loss. Pt heard a pop on July 4th and then had a "whooshing sound in her ear". She noticed she cannot hear at all our of left ear now. The "echo" continues. Denies any ear pain, headaches, vision changes, dizziness, or upper strength changes.   Her husband did pass away on July 5th. He was very sick. She has quite a bit of support from her daughter and sister.   Pt is back on her belsomnra and doing well. She would like refills.   .. Active Ambulatory Problems    Diagnosis Date Noted   History of shingles 04/08/2017   Adenomatous polyp 04/08/2017   Pure hypercholesterolemia 04/08/2017   History of kidney stones 04/08/2017   Essential hypertension 04/08/2017   Depression, recurrent ([place]) 04/08/2017   Anxiety 04/08/2017   Greater trochanteric bursitis of left hip 04/08/2017   Left hip pain 04/10/2017   Metatarsalgia of left foot 08/20/2018   Chronic foot pain, right 08/20/2018   Primary osteoarthritis of right foot 09/25/2018   Snoring 09/25/2018   Night terrors, adult 09/25/2018   Multiple falls 09/25/2018   Orthostatic hypotension 02/01/2019   Dizziness 02/01/2019   Moderate episode of recurrent major depressive disorder ([place]) 02/09/2019   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 02/09/2019   Shuffling gait 02/09/2019   Balance problems 02/09/2019   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   Blister of great toe of left foot 08/19/2019   Osteoporosis 09/15/2019   Primary parkinsonism ([place]) 10/26/2019   Mixed hyperlipidemia 06/06/2020   Primary insomnia 06/06/2020   Palpitations 09/05/2020   SOB (shortness of breath) 09/05/2020   Chest pain 09/05/2020   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 03/09/2021    DDD (degenerative disc disease), lumbar 04/11/2021   Synovial cyst of lumbar facet joint 09/19/2021   Medication management 04/16/2022   Decreased GFR 04/17/2022   Tinnitus of left ear 07/03/2022   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       Objective:    BP 137/69   Pulse 74   Ht '5\' 1"'$  (1.549 m)   Wt 131 lb (59.4 kg)   SpO2 100%   BMI 24.75 kg/m  BP Readings from Last 3 Encounters:  07/03/22 137/69  04/16/22 126/71  10/12/21 (!) 152/70   Wt Readings from Last 3 Encounters:  07/03/22 131 lb (59.4 kg)  04/16/22 137 lb (62.1 kg)  10/12/21 141 lb (64 kg)      Physical Exam Constitutional:      Appearance: Normal appearance.  HENT:     Head: Normocephalic.     Right Ear: Tympanic membrane, ear canal and external ear normal. There is no impacted cerumen.     Left Ear: Tympanic membrane, ear canal and external ear normal. There is  no impacted cerumen.     Nose: Nose normal.  Eyes:     Conjunctiva/sclera: Conjunctivae normal.  Neck:     Vascular: No carotid bruit.  Cardiovascular:     Rate and Rhythm: Normal rate and regular rhythm.     Pulses: Normal pulses.     Heart sounds: Murmur heard.  Pulmonary:     Effort: Pulmonary effort is normal.     Breath sounds: Normal breath sounds.  Musculoskeletal:     Cervical back: Normal range of motion and neck supple. No tenderness.  Lymphadenopathy:     Cervical: No cervical adenopathy.  Neurological:     General: No focal deficit present.     Mental Status: She is alert.  Psychiatric:        Mood and Affect: Mood normal.     Hearing Screening   '500Hz'$   '1000Hz'$  '4000Hz'$   Right ear '20 25 25  '$ Left ear      Weber- per patient did not hear or feel in either ear Rinne-  Right- AC greater than BC Left- BC greater than AC     Assessment & Plan:  Marland KitchenMarland KitchenJaynie was seen today for ear problem.  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  Tinnitus of left ear -     Ambulatory referral to ENT  Other orders -     Discontinue: Suvorexant (BELSOMRA) 10 MG TABS; Take 10 mg by mouth at bedtime.   Due to sudden hearing loss in left ear and tinnitus made ENT referral to consider imaging.  Reassured TM looks great no perforation/inflammation/infection.   Belsomnra refilled.      Chelsey Planas, PA-C

## 2022-07-08 DIAGNOSIS — H903 Sensorineural hearing loss, bilateral: Secondary | ICD-10-CM | POA: Diagnosis not present

## 2022-07-08 DIAGNOSIS — H838X3 Other specified diseases of inner ear, bilateral: Secondary | ICD-10-CM | POA: Diagnosis not present

## 2022-07-09 DIAGNOSIS — H401121 Primary open-angle glaucoma, left eye, mild stage: Secondary | ICD-10-CM | POA: Diagnosis not present

## 2022-07-09 DIAGNOSIS — Z961 Presence of intraocular lens: Secondary | ICD-10-CM | POA: Diagnosis not present

## 2022-07-09 DIAGNOSIS — H04121 Dry eye syndrome of right lacrimal gland: Secondary | ICD-10-CM | POA: Diagnosis not present

## 2022-07-09 DIAGNOSIS — H401112 Primary open-angle glaucoma, right eye, moderate stage: Secondary | ICD-10-CM | POA: Diagnosis not present

## 2022-07-09 DIAGNOSIS — H52223 Regular astigmatism, bilateral: Secondary | ICD-10-CM | POA: Diagnosis not present

## 2022-07-19 ENCOUNTER — Other Ambulatory Visit: Payer: Self-pay | Admitting: Physician Assistant

## 2022-07-19 DIAGNOSIS — I10 Essential (primary) hypertension: Secondary | ICD-10-CM

## 2022-08-16 ENCOUNTER — Telehealth: Payer: Self-pay | Admitting: Neurology

## 2022-08-16 NOTE — Telephone Encounter (Signed)
Patient LVM stating [facility] only filled 19 pills for her Bupropion. Tried to call back to discuss, no answer. LVM letting patient know to contact pharmacy directly as we wrote this for 90 tablets. To call back with additional questions.

## 2022-08-28 DIAGNOSIS — N3281 Overactive bladder: Secondary | ICD-10-CM | POA: Diagnosis not present

## 2022-08-28 DIAGNOSIS — R3915 Urgency of urination: Secondary | ICD-10-CM | POA: Diagnosis not present

## 2022-08-28 DIAGNOSIS — Z87442 Personal history of urinary calculi: Secondary | ICD-10-CM | POA: Diagnosis not present

## 2022-09-04 DIAGNOSIS — Z23 Encounter for immunization: Secondary | ICD-10-CM | POA: Diagnosis not present

## 2022-09-09 NOTE — Progress Notes (Unsigned)
Assessment/Plan:   1.  Parkinsons Disease  -Likely idiopathic, but did have diplopia early on, so atypical state has been considered but haven't seen this progress as I would except for that.  This has been complicated by the fact that the patient has had multiple eye surgeries.  -Continue carbidopa/levodopa 25/100, 1 tablet 3 times per day.  -Discussed with the patient the importance of compliance.  With each visit that I have seen her, she has gone a year between visits.  Discussed with her that this is really not ideal in Parkinson's care.  2.  Diplopia  -Follows with ophthalmology.  Long history of eye surgeries, as above.  3.  GAD/depression  -Mostly related to family situation.  In counseling.  Husband alcoholic, which obviously stressful and she is primary caregiver and he is falling and has failing health  4.  Parkinsons Disease dyskinesia  -mild.  She doesn't notice it much and no med needed  5.  Low back pain with sacral cyst  -Status post surgical intervention and October, 2022 with Dr. Ronnald Ramp   Subjective:   Chelsey Estes was seen today in follow up for Parkinsons disease.  My previous records were reviewed prior to todays visit as well as outside records available to me.  It has been a year since I have seen the patient.  Not long after our last visit she had low back surgery with Dr. Ronnald Ramp in October, 2022.  She did well with that.  She has done some physical therapy.  Her last physical therapy was in March.  Notes reviewed.    Current prescribed movement disorder medications:  Carbidopa/levodopa 25/100, 1 tablet 3 times per day.   PREVIOUS MEDICATIONS: Sinemet  ALLERGIES:   Allergies  Allergen Reactions   Trazodone And Nefazodone Nausea Only and Other (See Comments)    Nausea/dizzines/cramps     CURRENT MEDICATIONS:  Outpatient Encounter Medications as of 09/10/2022  Medication Sig   ALPHAGAN P 0.1 % SOLN Place 1 drop into both eyes in the morning and at  bedtime.    buPROPion (WELLBUTRIN XL) 300 MG 24 hr tablet Take one tablet daily.   carbidopa-levodopa (SINEMET IR) 25-100 MG tablet TAKE 1 TABLET BY MOUTH THREE TIMES DAILY   cholecalciferol (VITAMIN D3) 25 MCG (1000 UNIT) tablet Take 1,000 Units by mouth daily.   cyanocobalamin 2000 MCG tablet Take 2,000 mcg by mouth daily.   lisinopril (ZESTRIL) 5 MG tablet TAKE 1 TABLET BY MOUTH TWICE DAILY   simvastatin (ZOCOR) 20 MG tablet Take 1 tablet (20 mg total) by mouth at bedtime.   Suvorexant (BELSOMRA) 10 MG TABS Take 10 mg by mouth at bedtime.   timolol (TIMOPTIC) 0.5 % ophthalmic solution Place 1 drop into both eyes daily.    No facility-administered encounter medications on file as of 09/10/2022.    Objective:   PHYSICAL EXAMINATION:    VITALS:   There were no vitals filed for this visit.    GEN:  The patient appears stated age and is in NAD. HEENT:  Normocephalic, atraumatic.  The mucous membranes are moist. The superficial temporal arteries are without ropiness or tenderness. CV:  RRR Lungs:  CTAB Neck/HEME:  There are no carotid bruits bilaterally.  Neurological examination:  Orientation: The patient is alert and oriented x3. Cranial nerves: There is good facial symmetry with mild facial hypomimia. The speech is fluent and clear. Soft palate rises symmetrically and there is no tongue deviation. Hearing is intact to conversational tone. Sensation:  Sensation is intact to light touch throughout Motor: Strength is at least antigravity x4.  Movement examination: Tone: There is normal tone in the UE/LE Abnormal movements: there is dyskinesia in the RLE, overall mild Coordination:  There is no decremation with RAM's, with any form of RAMS, including alternating supination and pronation of the forearm, hand opening and closing, finger taps, heel taps and toe taps. Gait and Station: The patient has no difficulty arising out of a deep-seated chair without the use of the hands. The  patient drags the R leg with ambulation a bit.  I have reviewed and interpreted the following labs independently    Chemistry      Component Value Date/Time   NA 140 04/16/2022 0000   NA 143 10/31/2021 0941   K 4.7 04/16/2022 0000   CL 106 04/16/2022 0000   CO2 28 04/16/2022 0000   BUN 21 04/16/2022 0000   BUN 20 10/31/2021 0941   CREATININE 0.91 04/16/2022 0000   GLU 109 09/25/2016 0000      Component Value Date/Time   CALCIUM 10.1 04/16/2022 0000   ALKPHOS 76 10/31/2021 0941   AST 9 (L) 04/16/2022 0000   ALT <3 (L) 04/16/2022 0000   BILITOT 0.4 04/16/2022 0000   BILITOT 0.4 10/31/2021 0941       Lab Results  Component Value Date   WBC 10.7 10/31/2021   HGB 14.4 10/31/2021   HCT 44.1 10/31/2021   MCV 89 10/31/2021   PLT 399 10/31/2021    Lab Results  Component Value Date   TSH 1.010 09/05/2020     Total time spent on today's visit was *** minutes, including both face-to-face time and nonface-to-face time.  Time included that spent on review of records (prior notes available to me/labs/imaging if pertinent), discussing treatment and goals, answering patient's questions and coordinating care.  Cc:  Donella Stade, PA-C

## 2022-09-10 ENCOUNTER — Ambulatory Visit (INDEPENDENT_AMBULATORY_CARE_PROVIDER_SITE_OTHER): Payer: Medicare Other | Admitting: Neurology

## 2022-09-10 VITALS — BP 124/72 | HR 72 | Ht 61.0 in | Wt 129.6 lb

## 2022-09-10 DIAGNOSIS — K5901 Slow transit constipation: Secondary | ICD-10-CM

## 2022-09-10 DIAGNOSIS — G20 Parkinson's disease: Secondary | ICD-10-CM | POA: Diagnosis not present

## 2022-09-10 NOTE — Patient Instructions (Addendum)
Constipation and Parkinson's disease:  1.[place]o recipe for constipation in Parkinsons Disease:  -1 cup of unprocessed bran (need to get this at [employer], [employer] or similar type of store), 2 cups of applesauce in 1 cup of prune juice 2.  Increase fiber intake (Metamucil,vegetables) 3.  Regular, moderate exercise can be beneficial. 4.  Avoid medications causing constipation, such as medications like antacids with calcium or magnesium 5.  It's okay to take daily Miralax, and taper if stools become too loose or you experience diarrhea 6.  Stool softeners (Colace) can help with chronic constipation and I recommend you take this daily. 7.  Increase water intake.  You should be drinking 1/2 gallon of water a day as long as you have not been diagnosed with congestive heart failure or renal/kidney failure.  This is probably the single greatest thing that you can do to help your constipation.   Local and Online Resources for Power over Parkinson's Group September 2023  LOCAL [place] PARKINSON'S GROUPS  Power over Parkinson's Group:   Power Over Parkinson's Patient Education Group will be Wednesday, September 13th-*Hybrid meting*- in person at [facility] location and via [employer] at 2 pm.   Upcoming Power over [employer] Meetings:  2nd Wednesdays of the month at 2 pm:  September 13th, October 11th, November 8th Contact Amy Marriott at [email or web address] if interested in participating in this group Parkinson's Care Partners Group:    3rd Mondays, Contact Misty Paladino Atypical Parkinsonian Patient Group:   4th Wednesdays, [place] If you are interested in participating in these groups with Misty, please contact her directly for how to join those meetings.  Her contact information is [email or web address].    LOCAL EVENTS AND NEW OFFERINGS New PWR! Moves Dynegy Instructor-Led Classes offering at [employer]!  Wednesdays 1-2 pm.    Contact Vonna Kotyk at  [email or web address] or Caron Presume at [place], [email or web address] Dance for Parkinson 's classes will be on Tuesdays 9:30am-10:30am starting October 3-December 12 with a break the week of November 21 . Located in the December 04 which is in the first floor of the [employer]  ([place] for Parkinson's will be held on 2nd and 4th Mondays at 11:00am . First class will start  September 25th.  Located at the [place] ([place] Through support from the [place] and Drumming for Parkinson's classes are free for both patients and caregivers.  Contact Misty Taylor-Paladino for more details about registering.  [place]:  www.[email or web address] PD Health at Home continues:  Mindfulness Mondays, Wellness Wednesdays, Fitness Fridays  Upcoming Education:   Navigating Nutrition with PD.  Wednesday, Sept. 6th 1:00-2:00 pm Understanding Mind and Memory.  Wednesday, Sept. 20th 1:00-2:00 pm  Expert Briefing:    Parkinson's Disease and the Bladder.  Wednesday, Sept. 13th 1:00-2:00 pm Parkinson's and the Gut-Brain Connection.  Wednesday, Oct. 11th 1:00-2:00 pm Register for expert briefings (webinars) at 06-01-1976 Please check out their website to sign up for emails and see their full online offerings   [place]:  [email or web address]  Third Thursday Webinars:  On the third Thursday of every month at 12 p.m. ET, join our free live webinars to learn about various aspects of living with Parkinson's disease and our work to speed medical breakthroughs. Upcoming Webinar:  Stay tuned Check out additional information on their website to see their full online offerings  [place]  [place]:   [email or web address] Upcoming Webinar:   Stay tuned Webinar Series:  Living with Parkinson's Meetup.   Third Thursdays each month, 3 pm Care Partner Monthly Meetup.  With Robin Searing [place]inney.  First Tuesday of each month, 2 pm Check out additional information to Live Well Today on their website  Parkinson and Movement Disorders (PMD) Alliance:  www.pmdalliance.org [employer] Online:  Online Education Events Sign up for emails, which are sent weekly to give you updates on programming and online offerings  Parkinson's Association of the [place]:  www.[email or web address] Information on online support groups, education events, and online exercises including Yoga, Parkinson's exercises and more-LOTS of information on links to PD resources and online events Virtual Support Group through Parkinson's Association of the [place]; next one is scheduled for Wednesday, October 4th at 2 pm. (No September meeting due to the symposium.  These are typically scheduled for the 1st Wednesday of the month at 2 pm).  Visit website for details. Register for "Caring for Parkinson's-Caring for You", 9th Annual Symposium.  In-person event in [place].  September 9th.  To register:  [email or web address].org/symposium-[email or web address]_aid=[email or web address] MOVEMENT AND EXERCISE OPPORTUNITIES PWR! Moves Classes at [place].  Wednesdays 10 and 11 am.   Contact Amy Marriott, PT [email or web address] if interested. NEW PWR! Moves Class offerings at [employer].  Wednesdays 1-2 pm.  Contact Vonna Kotyk at  [email or web address] or Caron Presume at [place],  [email or web address] Parkinson's Wellness Recovery (PWR! Moves)  www.[email or web address] Info on the PWR! Virtual Experience:  You will have access to our expertise through self-assessment, guided plans that start with the PD-specific fundamentals, educational content, tips, Q&A with an expert, and a growing Art therapist of  PD-specific pre-recorded and live exercise classes of varying types and intensity - both physical and cognitive! If that is not enough, we offer 1:1 wellness consultations (in-person or virtual) to personalize your PWR! Research scientist (medical).  [place] Fridays:  As part of the PD Health @ Home program, this free video series focuses each week on one aspect of fitness designed to support people living with Parkinson's.  These weekly videos highlight the [place] recent fitness guidelines for people with Parkinson's disease. [employer].si Dance for PD website is offering free, live-stream classes throughout the week, as well as links to [employer] of classes:  [email or web address]/ Virtual dance and Pilates for Parkinson's classes: Click on the Community Tab> Parkinson's Movement Initiative Tab.  To register for classes and for more information, visit [email or web address] and click the "community" tab.  [employer] Parkinson's Cycling Classes  [employer]:  Thursdays @ Noon-Live classes at [employer] ([employer] at [email or web address] or ([phone]) [employer]: Virtual Classes Mondays and Thursdays Jeanette Caprice classes Tuesday, Wednesday and Thursday (contact [place] at [email or web address]  or [phone]) [place] Varied levels of classes are offered Tuesdays and Thursdays at [facility].  Stretching with Verdis Frederickson weekly class is also offered for people with Parkinson's To observe a class or for more information, call [phone] or email Hezzie Bump at [email or web address] ADDITIONAL SUPPORT AND RESOURCES Well[employer]Spring [employer]:Online Caregiver Education Opportunities:  www.well-[email or web address]/caregiver-education/caregiver-support-group.  You may also contact Vickki Muff at [email or web address] or [phone].    Coping  with Difficult Caregiver Emotions.  Wednesday, September 20th, 10:30 am-12.  [facility] Navigating the Maze of Senior Care Options.  Thursday, September 28th, 4-5:15 pm.  [facility]. Well-Spring Navigator:  [facility] program, a free service to help individuals and families through the journey of determining care for older  adults.  The "Navigator" is a Education officer, museum, Arnell Asal, who will speak with a prospective client and/or loved ones to provide an assessment of the situation and a set of recommendations for a personalized care plan -- all free of charge, and whether Well[employer]Spring [employer] offers the needed service or not. If the need is not a service we provide, we are well-connected with reputable programs in town that we can refer you to.  www.well-springsolutions.org or to speak with the Navigator, call [phone].

## 2022-09-11 DIAGNOSIS — H401112 Primary open-angle glaucoma, right eye, moderate stage: Secondary | ICD-10-CM | POA: Diagnosis not present

## 2022-09-11 DIAGNOSIS — H401121 Primary open-angle glaucoma, left eye, mild stage: Secondary | ICD-10-CM | POA: Diagnosis not present

## 2022-09-16 ENCOUNTER — Ambulatory Visit: Payer: Medicare Other | Attending: Neurology | Admitting: Rehabilitative and Restorative Service Providers"

## 2022-09-16 ENCOUNTER — Other Ambulatory Visit: Payer: Self-pay

## 2022-09-16 ENCOUNTER — Encounter: Payer: Self-pay | Admitting: Rehabilitative and Restorative Service Providers"

## 2022-09-16 DIAGNOSIS — R2689 Other abnormalities of gait and mobility: Secondary | ICD-10-CM | POA: Diagnosis not present

## 2022-09-16 DIAGNOSIS — R2681 Unsteadiness on feet: Secondary | ICD-10-CM | POA: Insufficient documentation

## 2022-09-16 DIAGNOSIS — M6281 Muscle weakness (generalized): Secondary | ICD-10-CM | POA: Insufficient documentation

## 2022-09-16 DIAGNOSIS — R29818 Other symptoms and signs involving the nervous system: Secondary | ICD-10-CM | POA: Diagnosis not present

## 2022-09-16 DIAGNOSIS — G20A1 Parkinson's disease without dyskinesia, without mention of fluctuations: Secondary | ICD-10-CM | POA: Diagnosis not present

## 2022-09-16 NOTE — Therapy (Unsigned)
OUTPATIENT PHYSICAL THERAPY NEURO EVALUATION   Patient Name: Chelsey Estes MRN: [id number] DOB:09/20/1941, 81 y.o., female Today's Date: 09/16/2022   PCP: Iran Planas, [place] REFERRING PROVIDER: Alonza Bogus, DO   PT End of Session - 09/16/22 1450     Visit Number 1    Number of Visits 16    Date for PT Re-Evaluation 11/15/22    Authorization Type medicare and BCBS federal    PT Start Time 1450    PT Stop Time 1530    PT Time Calculation (min) 40 min    Activity Tolerance Patient tolerated treatment well    Behavior During Therapy WFL for tasks assessed/performed             Past Medical History:  Diagnosis Date   Anxiety    Arthritis    Depression    Glaucoma    History of kidney stones    7 lithrotripsy   Hyperlipidemia    Hypertension    Parkinson disease    Past Surgical History:  Procedure Laterality Date   ABDOMINAL HYSTERECTOMY     ANTERIOR (CYSTOCELE) AND POSTERIOR REPAIR (RECTOCELE) WITH XENFORM GRAFT AND SACROSPINOUS FIXATION     CATARACT EXTRACTION, BILATERAL     INGUINAL HERNIA REPAIR Left    LUMBAR LAMINECTOMY/DECOMPRESSION MICRODISCECTOMY Right 10/12/2021   Procedure: Right Lumbar four-five Laminectomy for facet/synovial cyst;  Surgeon: Eustace Moore, MD;  Location: [place];  Service: Neurosurgery;  Laterality: Right;   Patient Active Problem List   Diagnosis Date Noted   Tinnitus of left ear 07/03/2022   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 07/03/2022   Decreased GFR 04/17/2022   Medication management 04/16/2022   Synovial cyst of lumbar facet joint 09/19/2021   DDD (degenerative disc disease), lumbar 04/11/2021   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 03/09/2021   Palpitations 09/05/2020   SOB (shortness of breath) 09/05/2020   Chest pain 09/05/2020   Mixed hyperlipidemia 06/06/2020   Primary insomnia 06/06/2020   Primary parkinsonism 10/26/2019   Osteoporosis 09/15/2019    Blister of great toe of left foot 08/19/2019   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   Moderate episode of recurrent major depressive disorder ([place]) 02/09/2019   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 02/09/2019   Shuffling gait 02/09/2019   Balance problems 02/09/2019   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   Orthostatic hypotension 02/01/2019   Dizziness 02/01/2019   Primary osteoarthritis of right foot 09/25/2018   Snoring 09/25/2018   Night terrors, adult 09/25/2018   Multiple falls 09/25/2018   Metatarsalgia of left foot 08/20/2018   Chronic foot pain, right 08/20/2018   Left hip pain 04/10/2017   History of shingles 04/08/2017   Adenomatous polyp 04/08/2017   Pure hypercholesterolemia 04/08/2017   History of kidney stones 04/08/2017   Essential hypertension 04/08/2017   Depression, recurrent ([place]) 04/08/2017   Anxiety 04/08/2017   Greater trochanteric bursitis of left hip 04/08/2017    ONSET DATE: Iran Planas, PA  REFERRING DIAG: Alonza Bogus, DO  THERAPY DIAG:  Balance disorder  Muscle weakness (generalized)  Rationale for Evaluation and Treatment Rehabilitation  SUBJECTIVE:  SUBJECTIVE STATEMENT: The patient reports she shuffles her feet around the house and uses a walker intermittent (mostly in the morning).  Her husband passed away in 07/01/22. She denies falls, but does feel a decline in mobility.   Pt accompanied by: self  PERTINENT HISTORY: h/o parkinsonism,   PAIN:  Are you having pain? No She notes she needs surgery for her kidney stones.  PRECAUTIONS: Fall  WEIGHT BEARING RESTRICTIONS No  FALLS: Has patient fallen in last 6 months? No  LIVING ENVIRONMENT: Lives with: lives with their family and lives alone Lives in: House/apartment Stairs: Yes: External: 5 (low  height) steps; can reach both Has following equipment at home: Walker - 4 wheeled and Grab bars  PLOF: Independent  PATIENT GOALS I want to be able to walk more normally (not shuffle, not bend forward)  OBJECTIVE:   COGNITION: Overall cognitive status: Within functional limits for tasks assessed   SENSATION: WFL  COORDINATION: Slowed movement with RAM, able to do finger opposition equal bilaterally  POSTURE: rounded shoulders and forward head  LOWER EXTREMITY MMT:    MMT Right Eval Left Eval  Hip flexion 3+/5 3+/5  Hip extension    Hip abduction    Hip adduction    Hip internal rotation    Hip external rotation    Knee flexion 5/5 5/5  Knee extension 5/5 5/5  Ankle dorsiflexion 5/5 5/5  (Blank rows = not tested)  BED MOBILITY:  independent  TRANSFERS: Sit to stand: Complete Independence Stand to sit: Complete Independence Chair to chair: Modified independence Floor:  TBA  STAIRS: Comments: TBA  GAIT: Gait pattern: step through pattern, decreased arm swing- Right, decreased arm swing- Left, decreased step length- Right, decreased step length- Left, decreased stride length, decreased hip/knee flexion- Right, decreased hip/knee flexion- Left, decreased ankle dorsiflexion- Right, decreased ankle dorsiflexion- Left, shuffling, festinating, and narrow BOS Distance walked: 200 FT Assistive device utilized: None Level of assistance: Complete Independence Comments: patient reports   FUNCTIONAL TESTs:  5 times sit to stand: 17 seconds Timed up and go (TUG): 11 10 meter walk test: 2.86 ft/sec  Mini Best 14 seconds  [facility] PT Assessment - 09/16/22 1505       Standardized Balance Assessment   Standardized Balance Assessment Timed Up and Go Test;Mini-BESTest      Mini-BESTest   Sit To Stand Normal: Comes to stand without use of hands and stabilizes independently.    Rise to Toes Moderate: Heels up, but not full range (smaller than when holding hands), OR noticeable  instability for 3 s.    Stand on one leg (left) Moderate: < 20 s    Stand on one leg (right) Moderate: < 20 s    Stand on one leg - lowest score 1    Compensatory Stepping Correction - Forward No step, OR would fall if not caught, OR falls spontaneously.    Compensatory Stepping Correction - Backward Moderate: More than one step is required to recover equilibrium    Compensatory Stepping Correction - Left Lateral Moderate: Several steps to recover equilibrium    Compensatory Stepping Correction - Right Lateral Moderate: Several steps to recover equilibrium    Stepping Corredtion Lateral - lowest score 1    Stance - Feet together, eyes open, firm surface  Normal: 30s    Stance - Feet together, eyes closed, foam surface  Normal: 30s    Incline - Eyes Closed Severe: Unable    Change in Gait Speed Moderate: Unable to change  walking speed or signs of imbalance    Walk with head turns - Horizontal Moderate: performs head turns with reduction in gait speed.    Walk with pivot turns Moderate:Turns with feet close SLOW (>4 steps) with good balance.    Step over obstacles Moderate: Steps over box but touches box OR displays cautious behavior by slowing gait.    Timed UP & GO with Dual Task Severe: Stops counting while walking OR stops walking while counting.    Mini-BEST total score 14      Timed Up and Go Test   Normal TUG (seconds) 11    Manual TUG (seconds) 12    Cognitive TUG (seconds) 19    TUG Comments mini BEST 14/28              PATIENT SURVEYS:  N/a  TODAY'S TREATMENT:  Patient education/ discussed PD resources in the community  PATIENT EDUCATION: Education details: POP support group, exercise class at [facility] in [place] post d/c plan Person educated: Patient Education method: Explanation Education comprehension: verbalized understanding   HOME EXERCISE PROGRAM: None at today's visit.  Plan to start with large amplitude routine next session.  GOALS: Goals reviewed with  patient? Yes  SHORT TERM GOALS: Target date: 10/14/2022  *** Baseline: Goal status: {GOALSTATUS:25110}  2.  *** Baseline:  Goal status: {GOALSTATUS:25110}  3.  *** Baseline:  Goal status: {GOALSTATUS:25110}  4.  *** Baseline:  Goal status: {GOALSTATUS:25110}  5.  *** Baseline:  Goal status: {GOALSTATUS:25110}  6.  *** Baseline:  Goal status: {GOALSTATUS:25110}  LONG TERM GOALS: Target date: 11/11/2022  *** Baseline:  Goal status: {GOALSTATUS:25110}  2.  *** Baseline:  Goal status: {GOALSTATUS:25110}  3.  *** Baseline:  Goal status: {GOALSTATUS:25110}  4.  *** Baseline:  Goal status: {GOALSTATUS:25110}  5.  *** Baseline:  Goal status: {GOALSTATUS:25110}  6.  *** Baseline:  Goal status: {GOALSTATUS:25110}  ASSESSMENT:  CLINICAL IMPRESSION: Patient is a *** y.o. *** who was seen today for physical therapy evaluation and treatment for ***.    OBJECTIVE IMPAIRMENTS {opptimpairments:25111}.   ACTIVITY LIMITATIONS {activitylimitations:27494}  PARTICIPATION LIMITATIONS: {participationrestrictions:25113}  PERSONAL FACTORS {Personal factors:25162} are also affecting patient's functional outcome.   REHAB POTENTIAL: {rehabpotential:25112}  CLINICAL DECISION MAKING: {clinical decision making:25114}  EVALUATION COMPLEXITY: {Evaluation complexity:25115}  PLAN: PT FREQUENCY: {rehab frequency:25116}  PT DURATION: {rehab duration:25117}  PLANNED INTERVENTIONS: {rehab planned interventions:25118::"Therapeutic exercises","Therapeutic activity","Neuromuscular re-education","Balance training","Gait training","Patient/Family education","Self Care","Joint mobilization"}  PLAN FOR NEXT SESSION: ***   Khyron Garno, PT 09/16/2022, 2:54 PM

## 2022-09-18 ENCOUNTER — Encounter: Payer: Self-pay | Admitting: Physical Therapy

## 2022-09-18 ENCOUNTER — Telehealth: Payer: Self-pay

## 2022-09-18 ENCOUNTER — Ambulatory Visit: Payer: Medicare Other | Admitting: Physical Therapy

## 2022-09-18 DIAGNOSIS — R29818 Other symptoms and signs involving the nervous system: Secondary | ICD-10-CM | POA: Diagnosis not present

## 2022-09-18 DIAGNOSIS — R2681 Unsteadiness on feet: Secondary | ICD-10-CM | POA: Diagnosis not present

## 2022-09-18 DIAGNOSIS — R2689 Other abnormalities of gait and mobility: Secondary | ICD-10-CM | POA: Diagnosis not present

## 2022-09-18 DIAGNOSIS — M6281 Muscle weakness (generalized): Secondary | ICD-10-CM | POA: Diagnosis not present

## 2022-09-18 DIAGNOSIS — G20A1 Parkinson's disease without dyskinesia, without mention of fluctuations: Secondary | ICD-10-CM | POA: Diagnosis not present

## 2022-09-18 NOTE — Therapy (Signed)
OUTPATIENT PHYSICAL THERAPY   Patient Name: Chelsey Estes MRN: [id number] DOB:10-Oct-1941, 81 y.o., female Today's Date: 09/18/2022   PCP: Iran Planas, [place] REFERRING PROVIDER: Alonza Bogus, DO   PT End of Session - 09/18/22 1224     Visit Number 2    Number of Visits 16    Date for PT Re-Evaluation 11/15/22    Authorization Type medicare and BCBS federal    Authorization - Visit Number 2    Progress Note Due on Visit 10    PT Start Time 1601    PT Stop Time 1225    PT Time Calculation (min) 40 min    Activity Tolerance Patient tolerated treatment well    Behavior During Therapy WFL for tasks assessed/performed              Past Medical History:  Diagnosis Date   Anxiety    Arthritis    Depression    Glaucoma    History of kidney stones    7 lithrotripsy   Hyperlipidemia    Hypertension    Parkinson disease    Past Surgical History:  Procedure Laterality Date   ABDOMINAL HYSTERECTOMY     ANTERIOR (CYSTOCELE) AND POSTERIOR REPAIR (RECTOCELE) WITH XENFORM GRAFT AND SACROSPINOUS FIXATION     CATARACT EXTRACTION, BILATERAL     INGUINAL HERNIA REPAIR Left    LUMBAR LAMINECTOMY/DECOMPRESSION MICRODISCECTOMY Right 10/12/2021   Procedure: Right Lumbar four-five Laminectomy for facet/synovial cyst;  Surgeon: Eustace Moore, MD;  Location: [facility];  Service: Neurosurgery;  Laterality: Right;   Patient Active Problem List   Diagnosis Date Noted   Tinnitus of left ear 07/03/2022   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 07/03/2022   Decreased GFR 04/17/2022   Medication management 04/16/2022   Synovial cyst of lumbar facet joint 09/19/2021   DDD (degenerative disc disease), lumbar 04/11/2021   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 03/09/2021   Palpitations 09/05/2020   SOB (shortness of breath) 09/05/2020   Chest pain 09/05/2020   Mixed hyperlipidemia 06/06/2020   Primary insomnia 06/06/2020   Primary  parkinsonism 10/26/2019   Osteoporosis 09/15/2019   Blister of great toe of left foot 08/19/2019   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   Moderate episode of recurrent major depressive disorder ([place]) 02/09/2019   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 02/09/2019   Shuffling gait 02/09/2019   Balance problems 02/09/2019   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   Orthostatic hypotension 02/01/2019   Dizziness 02/01/2019   Primary osteoarthritis of right foot 09/25/2018   Snoring 09/25/2018   Night terrors, adult 09/25/2018   Multiple falls 09/25/2018   Metatarsalgia of left foot 08/20/2018   Chronic foot pain, right 08/20/2018   Left hip pain 04/10/2017   History of shingles 04/08/2017   Adenomatous polyp 04/08/2017   Pure hypercholesterolemia 04/08/2017   History of kidney stones 04/08/2017   Essential hypertension 04/08/2017   Depression, recurrent ([place]) 04/08/2017   Anxiety 04/08/2017   Greater trochanteric bursitis of left hip 04/08/2017    ONSET DATE: 09/10/22  REFERRING DIAG: Parkinson's Disease  THERAPY DIAG:  Muscle weakness (generalized)  Other abnormalities of gait and mobility  Unsteadiness on feet  Rationale for Evaluation and Treatment Rehabilitation  SUBJECTIVE:  SUBJECTIVE STATEMENT: The patient states she has no new complaints. She is busy keeping up with things at home  PERTINENT HISTORY: h/o parkinsonism,   PAIN:  Are you having pain? Yes: NPRS scale: 2/10 Pain location: tail bone Pain description: sore Aggravating factors: pt thinks she slept funny Relieving factors: ibuprofen She notes she needs surgery for her kidney stones.  PATIENT GOALS I want to be able to walk more normally (not shuffle, not bend forward)  OBJECTIVE:   LOWER EXTREMITY MMT:    MMT  Right Eval Left Eval  Hip flexion 3+/5 3+/5  Hip extension    Hip abduction    Hip adduction    Hip internal rotation    Hip external rotation    Knee flexion 5/5 5/5  Knee extension 5/5 5/5  Ankle dorsiflexion 5/5 5/5  (Blank rows = not tested)    FUNCTIONAL TESTs:  5 times sit to stand: 17 seconds Timed up and go (TUG): 11 10 meter walk test: 2.86 ft/sec  Mini Best 14 seconds   TODAY'S TREATMENT:  09/18/22 Nustep L5 x 5 min  Mini squat 2 x 10 Side to side wt shift with overhead reach Staggered stance wt shift with reaching Alternating step backward Alternating step sideways Sit <> stand 3 x 5 Rocker board 2 x 1 min each A/P, laterally with UE support Stepping forward over noodle x 10 bilat Sidestepping over noodle x 10 bilat  Gait training outdoors with focus on reducing shuffling especially in Rt LE. Pt requires CGA for balance when walking up/down inclines and on uneven surfaces   HOME EXERCISE PROGRAM: Access Code: [email or web address] URL: [email or web address] Date: 09/18/2022 Prepared by: Isabelle Course  Exercises - Mini Squat with Counter Support  - 1-2 x daily - 7 x weekly - 2 sets - 10 reps - Side to Side Weight Shift with Overhead Reach and Counter Support  - 1-2 x daily - 7 x weekly - 2 sets - 10 reps - Staggered Stance Weight Shift with Arms Reaching  - 1-2 x daily - 7 x weekly - 2 sets - 10 reps - Alternating Step Backward with Support  - 1 x daily - 7 x weekly - 2 sets - 10 reps - Alternating Side Step  - 1-2 x daily - 7 x weekly - 2 sets - 10 reps  GOALS: Goals reviewed with patient? Yes  SHORT TERM GOALS: Target date: 10/14/2022  The patient will be indep with initial HEP for large amplitude movements. Baseline:No HEP Goal status: INITIAL  2.  The patient will reduce 5 time sit<>stand to < or equal to 14 seconds. Baseline: 17 seconds. Goal status: INITIAL  3.  The patient will improve miniBEST test to > or equal to 18/28. Baseline:  14/28 Goal status: INITIAL  4.  The patient will improve TUG cognitive to < or equal to 15 seconds. Baseline: 19 seconds Goal status: INITIAL  5.  The patient will move floor<>stand with UE support mod indep. Baseline: Not assessed. Goal status: INITIAL  LONG TERM GOALS: Target date: 11/11/2022  The patient will be indep with HEP progression. Baseline: no HEP Goal status: INITIAL  2.  The patient will improve mini Best test to > or equal to 20/28  Baseline: 14/28 Goal status: INITIAL  3.  The patient will verbalize understanding of community resources for PD including POP and PWR community classes. Baseline: No participation in community programs. Goal status: INITIAL  4.  The patient will verbalize 3 tips to  reduce freezing during gait. Baseline: Reports festination during gait. Goal status: INITIAL  ASSESSMENT:  CLINICAL IMPRESSION: Initiated HEP for large amplitude movements. Pt requires cuing for technique but is able to demo at end of session. Pt requires CGA during gait outdoors especially when fatigued at end of session. Pt will continue to benefit from strength, balance and endurance training   PLAN: PT FREQUENCY: 2x/week  PT DURATION: 8 weeks  PLANNED INTERVENTIONS: Therapeutic exercises, Therapeutic activity, Neuromuscular re-education, Balance training, Gait training, Patient/Family education, Self Care, Joint mobilization, and Joint manipulation  PLAN FOR NEXT SESSION: Large amplitude program from medbridge for HEP, dynamic gait training, high level balance, postural control, prior to d/c plan to provide POP information and PWR @ [place] information.  Tion Tse, PT 09/18/2022, 12:25 PM

## 2022-09-18 NOTE — Telephone Encounter (Signed)
Initiated Prior authorization [id number] '10MG'$  tablets Via: Covermymeds Case/Key:[id number]  Status: approved  as of 09/18/22 The authorization is valid from 08/19/2022 through 09/18/2023 Reason: Notified Pt via: Mychart

## 2022-09-23 ENCOUNTER — Encounter: Payer: Self-pay | Admitting: Physical Therapy

## 2022-09-23 ENCOUNTER — Ambulatory Visit: Payer: Medicare Other | Admitting: Physical Therapy

## 2022-09-23 DIAGNOSIS — R2681 Unsteadiness on feet: Secondary | ICD-10-CM | POA: Diagnosis not present

## 2022-09-23 DIAGNOSIS — R29818 Other symptoms and signs involving the nervous system: Secondary | ICD-10-CM | POA: Diagnosis not present

## 2022-09-23 DIAGNOSIS — M6281 Muscle weakness (generalized): Secondary | ICD-10-CM

## 2022-09-23 DIAGNOSIS — R2689 Other abnormalities of gait and mobility: Secondary | ICD-10-CM

## 2022-09-23 DIAGNOSIS — G20A1 Parkinson's disease without dyskinesia, without mention of fluctuations: Secondary | ICD-10-CM | POA: Diagnosis not present

## 2022-09-23 NOTE — Therapy (Signed)
OUTPATIENT PHYSICAL THERAPY   Patient Name: Chelsey Estes MRN: [id number] DOB:07-07-1941, 81 y.o., female Today's Date: 09/23/2022   PCP: Iran Planas, [place] REFERRING PROVIDER: Alonza Bogus, DO   PT End of Session - 09/23/22 1138     Visit Number 3    Number of Visits 16    Date for PT Re-Evaluation 11/15/22    Authorization Type medicare and BCBS federal    Authorization - Visit Number 3    Progress Note Due on Visit 10    PT Start Time 1059    PT Stop Time 1138    PT Time Calculation (min) 39 min    Activity Tolerance Patient tolerated treatment well    Behavior During Therapy WFL for tasks assessed/performed               Past Medical History:  Diagnosis Date   Anxiety    Arthritis    Depression    Glaucoma    History of kidney stones    7 lithrotripsy   Hyperlipidemia    Hypertension    Parkinson disease    Past Surgical History:  Procedure Laterality Date   ABDOMINAL HYSTERECTOMY     ANTERIOR (CYSTOCELE) AND POSTERIOR REPAIR (RECTOCELE) WITH XENFORM GRAFT AND SACROSPINOUS FIXATION     CATARACT EXTRACTION, BILATERAL     INGUINAL HERNIA REPAIR Left    LUMBAR LAMINECTOMY/DECOMPRESSION MICRODISCECTOMY Right 10/12/2021   Procedure: Right Lumbar four-five Laminectomy for facet/synovial cyst;  Surgeon: Eustace Moore, MD;  Location: [facility];  Service: Neurosurgery;  Laterality: Right;   Patient Active Problem List   Diagnosis Date Noted   Tinnitus of left ear 07/03/2022   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 07/03/2022   Decreased GFR 04/17/2022   Medication management 04/16/2022   Synovial cyst of lumbar facet joint 09/19/2021   DDD (degenerative disc disease), lumbar 04/11/2021   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 03/09/2021   Palpitations 09/05/2020   SOB (shortness of breath) 09/05/2020   Chest pain 09/05/2020   Mixed hyperlipidemia 06/06/2020   Primary insomnia 06/06/2020   Primary  parkinsonism 10/26/2019   Osteoporosis 09/15/2019   Blister of great toe of left foot 08/19/2019   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   Moderate episode of recurrent major depressive disorder ([place]) 02/09/2019   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 02/09/2019   Shuffling gait 02/09/2019   Balance problems 02/09/2019   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   Orthostatic hypotension 02/01/2019   Dizziness 02/01/2019   Primary osteoarthritis of right foot 09/25/2018   Snoring 09/25/2018   Night terrors, adult 09/25/2018   Multiple falls 09/25/2018   Metatarsalgia of left foot 08/20/2018   Chronic foot pain, right 08/20/2018   Left hip pain 04/10/2017   History of shingles 04/08/2017   Adenomatous polyp 04/08/2017   Pure hypercholesterolemia 04/08/2017   History of kidney stones 04/08/2017   Essential hypertension 04/08/2017   Depression, recurrent ([place]) 04/08/2017   Anxiety 04/08/2017   Greater trochanteric bursitis of left hip 04/08/2017    ONSET DATE: 09/10/22  REFERRING DIAG: Parkinson's Disease  THERAPY DIAG:  Muscle weakness (generalized)  Other abnormalities of gait and mobility  Unsteadiness on feet  Rationale for Evaluation and Treatment Rehabilitation  SUBJECTIVE:  SUBJECTIVE STATEMENT: Patient states Saturday was a good day, she felt confident in her walking. She states she feels "a little wobbly" today. Kidney stone surgery scheduled for 10/31 and 11/1  PERTINENT HISTORY: h/o parkinsonism,   PAIN:  Are you having pain? No She notes she needs surgery for her kidney stones.  PATIENT GOALS I want to be able to walk more normally (not shuffle, not bend forward)  OBJECTIVE:   LOWER EXTREMITY MMT:    MMT Right Eval Left Eval  Hip flexion 3+/5 3+/5  Hip extension    Hip  abduction    Hip adduction    Hip internal rotation    Hip external rotation    Knee flexion 5/5 5/5  Knee extension 5/5 5/5  Ankle dorsiflexion 5/5 5/5  (Blank rows = not tested)    FUNCTIONAL TESTs:  5 times sit to stand: 17 seconds Timed up and go (TUG): 11 10 meter walk test: 2.86 ft/sec  Mini Best 14 seconds   TODAY'S TREATMENT:  09/23/22 Recumbent bike L2 x 5 mins for warm up  Mini squat 2 x 10 Side to side wt shift with overhead reach Staggered stance wt shift with reaching Stepping over noodle 2 x 10 bilat forward and then 2 x 10 bilat sideways Rocker board 2 x 1 min each A/P and laterally with UE support Sit <> stand 3 x 5  Backward walking with close supervision 3 x 20' Side stepping 6 x 20'  09/18/22 Nustep L5 x 5 min  Mini squat 2 x 10 Side to side wt shift with overhead reach Staggered stance wt shift with reaching Alternating step backward Alternating step sideways Sit <> stand 3 x 5 Rocker board 2 x 1 min each A/P, laterally with UE support Stepping forward over noodle x 10 bilat Sidestepping over noodle x 10 bilat  Gait training outdoors with focus on reducing shuffling especially in Rt LE. Pt requires CGA for balance when walking up/down inclines and on uneven surfaces   HOME EXERCISE PROGRAM: Access Code: [email or web address] URL: [email or web address] Date: 09/18/2022 Prepared by: Isabelle Course  Exercises - Mini Squat with Counter Support  - 1-2 x daily - 7 x weekly - 2 sets - 10 reps - Side to Side Weight Shift with Overhead Reach and Counter Support  - 1-2 x daily - 7 x weekly - 2 sets - 10 reps - Staggered Stance Weight Shift with Arms Reaching  - 1-2 x daily - 7 x weekly - 2 sets - 10 reps - Alternating Step Backward with Support  - 1 x daily - 7 x weekly - 2 sets - 10 reps - Alternating Side Step  - 1-2 x daily - 7 x weekly - 2 sets - 10 reps  GOALS: Goals reviewed with patient? Yes  SHORT TERM GOALS: Target date:  10/14/2022  The patient will be indep with initial HEP for large amplitude movements. Baseline:No HEP Goal status: INITIAL  2.  The patient will reduce 5 time sit<>stand to < or equal to 14 seconds. Baseline: 17 seconds. Goal status: INITIAL  3.  The patient will improve miniBEST test to > or equal to 18/28. Baseline: 14/28 Goal status: INITIAL  4.  The patient will improve TUG cognitive to < or equal to 15 seconds. Baseline: 19 seconds Goal status: INITIAL  5.  The patient will move floor<>stand with UE support mod indep. Baseline: Not assessed. Goal status: INITIAL  LONG TERM GOALS: Target date: 11/11/2022  The patient will  be indep with HEP progression. Baseline: no HEP Goal status: INITIAL  2.  The patient will improve mini Best test to > or equal to 20/28  Baseline: 14/28 Goal status: INITIAL  3.  The patient will verbalize understanding of community resources for PD including POP and PWR community classes. Baseline: No participation in community programs. Goal status: INITIAL  4.  The patient will verbalize 3 tips to reduce freezing during gait. Baseline: Reports festination during gait. Goal status: INITIAL  ASSESSMENT:  CLINICAL IMPRESSION: Initiated HEP for large amplitude movements. Pt requires cuing for technique but is able to demo at end of session. Pt requires CGA during gait outdoors especially when fatigued at end of session. Pt will continue to benefit from strength, balance and endurance training   PLAN: PT FREQUENCY: 2x/week  PT DURATION: 8 weeks  PLANNED INTERVENTIONS: Therapeutic exercises, Therapeutic activity, Neuromuscular re-education, Balance training, Gait training, Patient/Family education, Self Care, Joint mobilization, and Joint manipulation  PLAN FOR NEXT SESSION: Large amplitude program from medbridge for HEP, dynamic gait training, high level balance, postural control, prior to d/c plan to provide POP information and PWR @ [place]  information.  Shereda Graw, PT 09/23/2022, 11:39 AM

## 2022-09-25 ENCOUNTER — Ambulatory Visit: Payer: Medicare Other | Admitting: Physical Therapy

## 2022-09-25 ENCOUNTER — Encounter: Payer: Self-pay | Admitting: Physical Therapy

## 2022-09-25 DIAGNOSIS — M6281 Muscle weakness (generalized): Secondary | ICD-10-CM

## 2022-09-25 DIAGNOSIS — R2681 Unsteadiness on feet: Secondary | ICD-10-CM

## 2022-09-25 DIAGNOSIS — R29818 Other symptoms and signs involving the nervous system: Secondary | ICD-10-CM | POA: Diagnosis not present

## 2022-09-25 DIAGNOSIS — R2689 Other abnormalities of gait and mobility: Secondary | ICD-10-CM

## 2022-09-25 DIAGNOSIS — G20A1 Parkinson's disease without dyskinesia, without mention of fluctuations: Secondary | ICD-10-CM | POA: Diagnosis not present

## 2022-09-25 NOTE — Therapy (Signed)
OUTPATIENT PHYSICAL THERAPY   Patient Name: Chelsey Estes MRN: [id number] DOB:1941-09-25, 81 y.o., female Today's Date: 09/25/2022   PCP: Iran Planas, [place] REFERRING PROVIDER: Alonza Bogus, DO   PT End of Session - 09/25/22 1224     Visit Number 4    Number of Visits 16    Date for PT Re-Evaluation 11/15/22    Authorization - Visit Number 4    Progress Note Due on Visit 10    PT Start Time 2725    PT Stop Time 1225    PT Time Calculation (min) 40 min    Activity Tolerance Patient tolerated treatment well    Behavior During Therapy WFL for tasks assessed/performed                Past Medical History:  Diagnosis Date   Anxiety    Arthritis    Depression    Glaucoma    History of kidney stones    7 lithrotripsy   Hyperlipidemia    Hypertension    Parkinson disease    Past Surgical History:  Procedure Laterality Date   ABDOMINAL HYSTERECTOMY     ANTERIOR (CYSTOCELE) AND POSTERIOR REPAIR (RECTOCELE) WITH XENFORM GRAFT AND SACROSPINOUS FIXATION     CATARACT EXTRACTION, BILATERAL     INGUINAL HERNIA REPAIR Left    LUMBAR LAMINECTOMY/DECOMPRESSION MICRODISCECTOMY Right 10/12/2021   Procedure: Right Lumbar four-five Laminectomy for facet/synovial cyst;  Surgeon: Eustace Moore, MD;  Location: [facility];  Service: Neurosurgery;  Laterality: Right;   Patient Active Problem List   Diagnosis Date Noted   Tinnitus of left ear 07/03/2022   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 07/03/2022   Decreased GFR 04/17/2022   Medication management 04/16/2022   Synovial cyst of lumbar facet joint 09/19/2021   DDD (degenerative disc disease), lumbar 04/11/2021   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 03/09/2021   Palpitations 09/05/2020   SOB (shortness of breath) 09/05/2020   Chest pain 09/05/2020   Mixed hyperlipidemia 06/06/2020   Primary insomnia 06/06/2020   Primary parkinsonism 10/26/2019   Osteoporosis  09/15/2019   Blister of great toe of left foot 08/19/2019   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   Moderate episode of recurrent major depressive disorder ([place]) 02/09/2019   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 02/09/2019   Shuffling gait 02/09/2019   Balance problems 02/09/2019   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   Orthostatic hypotension 02/01/2019   Dizziness 02/01/2019   Primary osteoarthritis of right foot 09/25/2018   Snoring 09/25/2018   Night terrors, adult 09/25/2018   Multiple falls 09/25/2018   Metatarsalgia of left foot 08/20/2018   Chronic foot pain, right 08/20/2018   Left hip pain 04/10/2017   History of shingles 04/08/2017   Adenomatous polyp 04/08/2017   Pure hypercholesterolemia 04/08/2017   History of kidney stones 04/08/2017   Essential hypertension 04/08/2017   Depression, recurrent ([place]) 04/08/2017   Anxiety 04/08/2017   Greater trochanteric bursitis of left hip 04/08/2017    ONSET DATE: 09/10/22  REFERRING DIAG: Parkinson's Disease  THERAPY DIAG:  Muscle weakness (generalized)  Other abnormalities of gait and mobility  Unsteadiness on feet  Rationale for Evaluation and Treatment Rehabilitation  SUBJECTIVE:  SUBJECTIVE STATEMENT: Patient states she continues to feel more comfortable with her walking  PERTINENT HISTORY: h/o parkinsonism,   PAIN:  Are you having pain? No She notes she needs surgery for her kidney stones.  PATIENT GOALS I want to be able to walk more normally (not shuffle, not bend forward)  OBJECTIVE:   LOWER EXTREMITY MMT:    MMT Right Eval Left Eval  Hip flexion 3+/5 3+/5  Hip extension    Hip abduction    Hip adduction    Hip internal rotation    Hip external rotation    Knee flexion 5/5 5/5  Knee extension 5/5 5/5  Ankle  dorsiflexion 5/5 5/5  (Blank rows = not tested)    FUNCTIONAL TESTs:  5 times sit to stand: 17 seconds Timed up and go (TUG): 11 10 meter walk test: 2.86 ft/sec  Mini Best 14 seconds   TODAY'S TREATMENT:  09/25/22 Nustep L5 x 5 min for warm up  Gait ladder focusing on increasing step length forward, laterally with CGA Backward gait with CGA Gait on treadmill grade 2 incline 0.8 mph 3 x 1 min  Mini squat with focus on full extension in standing 2 x 10 Step ups x 10 bilat with focus on full extension Rockerboard x 1 min each A/P and laterally with intermittent UE support Side to side step with reaching Alternating forward step with overhead reach Sit <> stand 2 x 10 focus on full extension  09/23/22 Recumbent bike L2 x 5 mins for warm up  Mini squat 2 x 10 Side to side wt shift with overhead reach Staggered stance wt shift with reaching Stepping over noodle 2 x 10 bilat forward and then 2 x 10 bilat sideways Rocker board 2 x 1 min each A/P and laterally with UE support Sit <> stand 3 x 5  Backward walking with close supervision 3 x 20' Side stepping 6 x 20'  09/18/22 Nustep L5 x 5 min  Mini squat 2 x 10 Side to side wt shift with overhead reach Staggered stance wt shift with reaching Alternating step backward Alternating step sideways Sit <> stand 3 x 5 Rocker board 2 x 1 min each A/P, laterally with UE support Stepping forward over noodle x 10 bilat Sidestepping over noodle x 10 bilat  Gait training outdoors with focus on reducing shuffling especially in Rt LE. Pt requires CGA for balance when walking up/down inclines and on uneven surfaces   HOME EXERCISE PROGRAM: Access Code: [email or web address] URL: [email or web address] Date: 09/18/2022 Prepared by: Isabelle Course  Exercises - Mini Squat with Counter Support  - 1-2 x daily - 7 x weekly - 2 sets - 10 reps - Side to Side Weight Shift with Overhead Reach and Counter Support  - 1-2 x daily - 7 x weekly  - 2 sets - 10 reps - Staggered Stance Weight Shift with Arms Reaching  - 1-2 x daily - 7 x weekly - 2 sets - 10 reps - Alternating Step Backward with Support  - 1 x daily - 7 x weekly - 2 sets - 10 reps - Alternating Side Step  - 1-2 x daily - 7 x weekly - 2 sets - 10 reps  GOALS: Goals reviewed with patient? Yes  SHORT TERM GOALS: Target date: 10/14/2022  The patient will be indep with initial HEP for large amplitude movements. Baseline:No HEP Goal status: INITIAL  2.  The patient will reduce 5 time sit<>stand to < or equal to 14 seconds. Baseline:  17 seconds. Goal status: INITIAL  3.  The patient will improve miniBEST test to > or equal to 18/28. Baseline: 14/28 Goal status: INITIAL  4.  The patient will improve TUG cognitive to < or equal to 15 seconds. Baseline: 19 seconds Goal status: INITIAL  5.  The patient will move floor<>stand with UE support mod indep. Baseline: Not assessed. Goal status: INITIAL  LONG TERM GOALS: Target date: 11/11/2022  The patient will be indep with HEP progression. Baseline: no HEP Goal status: INITIAL  2.  The patient will improve mini Best test to > or equal to 20/28  Baseline: 14/28 Goal status: INITIAL  3.  The patient will verbalize understanding of community resources for PD including POP and PWR community classes. Baseline: No participation in community programs. Goal status: INITIAL  4.  The patient will verbalize 3 tips to reduce freezing during gait. Baseline: Reports festination during gait. Goal status: INITIAL  ASSESSMENT:  CLINICAL IMPRESSION: Focused on gait training increasing step/stride length and improving hip and knee extension in standing and in stance phase during gait   PLAN: PT FREQUENCY: 2x/week  PT DURATION: 8 weeks  PLANNED INTERVENTIONS: Therapeutic exercises, Therapeutic activity, Neuromuscular re-education, Balance training, Gait training, Patient/Family education, Self Care, Joint mobilization,  and Joint manipulation  PLAN FOR NEXT SESSION: Large amplitude program from medbridge for HEP, dynamic gait training, high level balance, postural control, prior to d/c plan to provide POP information and PWR @ [place] information.  Delfin Squillace, PT 09/25/2022, 12:25 PM

## 2022-09-27 DIAGNOSIS — Z23 Encounter for immunization: Secondary | ICD-10-CM | POA: Diagnosis not present

## 2022-09-30 ENCOUNTER — Ambulatory Visit: Payer: Medicare Other | Admitting: Rehabilitative and Restorative Service Providers"

## 2022-09-30 ENCOUNTER — Encounter: Payer: Self-pay | Admitting: Rehabilitative and Restorative Service Providers"

## 2022-09-30 DIAGNOSIS — G20A1 Parkinson's disease without dyskinesia, without mention of fluctuations: Secondary | ICD-10-CM | POA: Diagnosis not present

## 2022-09-30 DIAGNOSIS — R2689 Other abnormalities of gait and mobility: Secondary | ICD-10-CM | POA: Diagnosis not present

## 2022-09-30 DIAGNOSIS — R2681 Unsteadiness on feet: Secondary | ICD-10-CM

## 2022-09-30 DIAGNOSIS — M6281 Muscle weakness (generalized): Secondary | ICD-10-CM | POA: Diagnosis not present

## 2022-09-30 DIAGNOSIS — R29818 Other symptoms and signs involving the nervous system: Secondary | ICD-10-CM

## 2022-09-30 NOTE — Therapy (Signed)
OUTPATIENT PHYSICAL THERAPY TREATMENT   Patient Name: Chelsey Estes MRN: [id number] DOB:Jun 13, 1941, 81 y.o., female Today's Date: 09/30/2022   PCP: Iran Planas, [place] REFERRING PROVIDER: Alonza Bogus, DO   PT End of Session - 09/30/22 1337     Visit Number 5    Number of Visits 16    Date for PT Re-Evaluation 11/15/22    Authorization - Visit Number 5    Progress Note Due on Visit 10    PT Start Time 9735    PT Stop Time 1420    PT Time Calculation (min) 42 min    Activity Tolerance Patient tolerated treatment well    Behavior During Therapy WFL for tasks assessed/performed                Past Medical History:  Diagnosis Date   Anxiety    Arthritis    Depression    Glaucoma    History of kidney stones    7 lithrotripsy   Hyperlipidemia    Hypertension    Parkinson disease    Past Surgical History:  Procedure Laterality Date   ABDOMINAL HYSTERECTOMY     ANTERIOR (CYSTOCELE) AND POSTERIOR REPAIR (RECTOCELE) WITH XENFORM GRAFT AND SACROSPINOUS FIXATION     CATARACT EXTRACTION, BILATERAL     INGUINAL HERNIA REPAIR Left    LUMBAR LAMINECTOMY/DECOMPRESSION MICRODISCECTOMY Right 10/12/2021   Procedure: Right Lumbar four-five Laminectomy for facet/synovial cyst;  Surgeon: Eustace Moore, MD;  Location: [facility];  Service: Neurosurgery;  Laterality: Right;   Patient Active Problem List   Diagnosis Date Noted   Tinnitus of left ear 07/03/2022   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 07/03/2022   Decreased GFR 04/17/2022   Medication management 04/16/2022   Synovial cyst of lumbar facet joint 09/19/2021   DDD (degenerative disc disease), lumbar 04/11/2021   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 03/09/2021   Palpitations 09/05/2020   SOB (shortness of breath) 09/05/2020   Chest pain 09/05/2020   Mixed hyperlipidemia 06/06/2020   Primary insomnia 06/06/2020   Primary parkinsonism 10/26/2019    Osteoporosis 09/15/2019   Blister of great toe of left foot 08/19/2019   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   Moderate episode of recurrent major depressive disorder ([place]) 02/09/2019   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 02/09/2019   Shuffling gait 02/09/2019   Balance problems 02/09/2019   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   Orthostatic hypotension 02/01/2019   Dizziness 02/01/2019   Primary osteoarthritis of right foot 09/25/2018   Snoring 09/25/2018   Night terrors, adult 09/25/2018   Multiple falls 09/25/2018   Metatarsalgia of left foot 08/20/2018   Chronic foot pain, right 08/20/2018   Left hip pain 04/10/2017   History of shingles 04/08/2017   Adenomatous polyp 04/08/2017   Pure hypercholesterolemia 04/08/2017   History of kidney stones 04/08/2017   Essential hypertension 04/08/2017   Depression, recurrent ([place]) 04/08/2017   Anxiety 04/08/2017   Greater trochanteric bursitis of left hip 04/08/2017    ONSET DATE: 09/10/22  REFERRING DIAG: Parkinson's Disease  THERAPY DIAG:  Muscle weakness (generalized)  Other abnormalities of gait and mobility  Unsteadiness on feet  Other symptoms and signs involving the nervous system  Balance disorder  Rationale for Evaluation and Treatment Rehabilitation  SUBJECTIVE:    SUBJECTIVE STATEMENT: Patient states she continues to feel more comfortable with her walking  PERTINENT HISTORY: h/o parkinsonism,   PAIN:  Are you having pain? No She notes she needs  surgery for her kidney stones.  PATIENT GOALS I want to be able to walk more normally (not shuffle, not bend forward)  OBJECTIVE:   ________________________________________________________________________________________________________________________________________________ From initial eval LOWER EXTREMITY MMT:    MMT Right Eval Left Eval  Hip flexion 3+/5 3+/5  Hip extension    Hip abduction    Hip adduction    Hip internal rotation    Hip external  rotation    Knee flexion 5/5 5/5  Knee extension 5/5 5/5  Ankle dorsiflexion 5/5 5/5  (Blank rows = not tested)  FUNCTIONAL TESTs:  5 times sit to stand: 17 seconds Timed up and go (TUG): 11 10 meter walk test: 2.86 ft/sec  Mini Best 14 seconds ____________________________________________________________________________________________________________________________________________________   TODAY'S TREATMENT:  09/30/22: THEREX Treadmill x at 3% incline up to 1.4 mph with intermittent UE Support Sit to stand with UE reach and posture up position x 10 reps Standing trunk twist + weight shifting with T to clap R and T to clap L x 10 reps near support if needed Lateral weight shifting x 10 reps R and L with demo cues Seated trunk extension x 10 reps focusing on upright posture Crouch to tall kneel x 10 reps Crouch to 1/2 kneel-- unable to do with tactile and demo cues Supine rotation T to sidelying x 5 reps each Supine large amplitude marching x 10 reps R and L sides Bridges x 10 reps with cues Alternating step reach anteriorly and bow and lean posteriorly with SBA for safety  09/25/22 Nustep L5 x 5 min for warm up  Gait ladder focusing on increasing step length forward, laterally with CGA Backward gait with CGA Gait on treadmill grade 2 incline 0.8 mph 3 x 1 min  Mini squat with focus on full extension in standing 2 x 10 Step ups x 10 bilat with focus on full extension Rockerboard x 1 min each A/P and laterally with intermittent UE support Side to side step with reaching Alternating forward step with overhead reach Sit <> stand 2 x 10 focus on full extension  09/23/22 Recumbent bike L2 x 5 mins for warm up  Mini squat 2 x 10 Side to side wt shift with overhead reach Staggered stance wt shift with reaching Stepping over noodle 2 x 10 bilat forward and then 2 x 10 bilat sideways Rocker board 2 x 1 min each A/P and laterally with UE support Sit <> stand 3 x 5  Backward  walking with close supervision 3 x 20' Side stepping 6 x 20'  HOME EXERCISE PROGRAM: Access Code: [email or web address] URL: [email or web address] Date: 09/30/2022 Prepared by: Rudell Cobb  Exercises - Sit to Stand with Arm Swing  - 1-2 x daily - 7 x weekly - 2 sets - 10 reps - Side to Side Weight Shift with Overhead Reach and Counter Support  - 1-2 x daily - 7 x weekly - 2 sets - 10 reps - Staggered Stance Weight Shift with Arms Reaching  - 1-2 x daily - 7 x weekly - 2 sets - 10 reps - Alternating Step Backward with Support  - 1 x daily - 7 x weekly - 2 sets - 10 reps - Alternating Side Step  - 1-2 x daily - 7 x weekly - 2 sets - 10 reps  GOALS: Goals reviewed with patient? Yes  SHORT TERM GOALS: Target date: 10/14/2022  The patient will be indep with initial HEP for large amplitude movements. Baseline:No HEP Goal status:IN PROGRESS  2.  The  patient will reduce 5 time sit<>stand to < or equal to 14 seconds. Baseline: 17 seconds. Goal status: IN PROGRESS, 14.07 seconds on 09/30/22  3.  The patient will improve miniBEST test to > or equal to 18/28. Baseline: 14/28 Goal status: IN PROGRESS  4.  The patient will improve TUG cognitive to < or equal to 15 seconds. Baseline: 19 seconds Goal status: IN PROGRESS  5.  The patient will move floor<>stand with UE support mod indep. Baseline: Not assessed. Goal status: IN PROGRESS  LONG TERM GOALS: Target date: 11/11/2022  The patient will be indep with HEP progression. Baseline: no HEP Goal status:IN PROGRESS  2.  The patient will improve mini Best test to > or equal to 20/28  Baseline: 14/28 Goal status:IN PROGRESS  3.  The patient will verbalize understanding of community resources for PD including POP and PWR community classes. Baseline: No participation in community programs. Goal status: IN PROGRESS  4.  The patient will verbalize 3 tips to reduce freezing during gait. Baseline: Reports festination during  gait. Goal status:IN PROGRESS  ASSESSMENT:  CLINICAL IMPRESSION: PT to continue to progress to STGs/LTGS.  Patient has procedure at end of the month and inquires about dates for PT.  We plan to discuss and schedule next session.  Have goals to transition to community classes.  PLAN: PT FREQUENCY: 2x/week  PT DURATION: 8 weeks  PLANNED INTERVENTIONS: Therapeutic exercises, Therapeutic activity, Neuromuscular re-education, Balance training, Gait training, Patient/Family education, Self Care, Joint mobilization, and Joint manipulation  PLAN FOR NEXT SESSION: Large amplitude program from medbridge for HEP, dynamic gait training, high level balance, postural control, prior to d/c plan to provide POP information and PWR @ [place] information.  *check STGs for 10/30.  [place], [place] 09/30/2022, 1:38 PM

## 2022-10-02 ENCOUNTER — Ambulatory Visit: Payer: Medicare Other | Admitting: Rehabilitative and Restorative Service Providers"

## 2022-10-02 ENCOUNTER — Encounter: Payer: Self-pay | Admitting: Rehabilitative and Restorative Service Providers"

## 2022-10-02 DIAGNOSIS — E785 Hyperlipidemia, unspecified: Secondary | ICD-10-CM | POA: Diagnosis not present

## 2022-10-02 DIAGNOSIS — R2689 Other abnormalities of gait and mobility: Secondary | ICD-10-CM | POA: Diagnosis not present

## 2022-10-02 DIAGNOSIS — N3941 Urge incontinence: Secondary | ICD-10-CM | POA: Diagnosis not present

## 2022-10-02 DIAGNOSIS — M6281 Muscle weakness (generalized): Secondary | ICD-10-CM | POA: Diagnosis not present

## 2022-10-02 DIAGNOSIS — R2681 Unsteadiness on feet: Secondary | ICD-10-CM | POA: Diagnosis not present

## 2022-10-02 DIAGNOSIS — I10 Essential (primary) hypertension: Secondary | ICD-10-CM | POA: Diagnosis not present

## 2022-10-02 DIAGNOSIS — G20A1 Parkinson's disease without dyskinesia, without mention of fluctuations: Secondary | ICD-10-CM | POA: Diagnosis not present

## 2022-10-02 DIAGNOSIS — N200 Calculus of kidney: Secondary | ICD-10-CM | POA: Diagnosis not present

## 2022-10-02 DIAGNOSIS — R29818 Other symptoms and signs involving the nervous system: Secondary | ICD-10-CM | POA: Diagnosis not present

## 2022-10-02 DIAGNOSIS — F32A Depression, unspecified: Secondary | ICD-10-CM | POA: Diagnosis not present

## 2022-10-02 DIAGNOSIS — Z01818 Encounter for other preprocedural examination: Secondary | ICD-10-CM | POA: Diagnosis not present

## 2022-10-02 NOTE — Therapy (Signed)
OUTPATIENT PHYSICAL THERAPY TREATMENT   Patient Name: Chelsey Estes MRN: [id number] DOB:05-26-1941, 81 y.o., female Today's Date: 10/02/2022   PCP: Iran Planas, [place] REFERRING PROVIDER: Alonza Bogus, DO   PT End of Session - 10/02/22 1105     Visit Number 6    Number of Visits 16    Date for PT Re-Evaluation 11/15/22    Authorization Type medicare and BCBS federal    Authorization - Visit Number 6    Progress Note Due on Visit 10    PT Start Time 1105    PT Stop Time 1145    PT Time Calculation (min) 40 min    Activity Tolerance Patient tolerated treatment well    Behavior During Therapy WFL for tasks assessed/performed                Past Medical History:  Diagnosis Date   Anxiety    Arthritis    Depression    Glaucoma    History of kidney stones    7 lithrotripsy   Hyperlipidemia    Hypertension    Parkinson disease    Past Surgical History:  Procedure Laterality Date   ABDOMINAL HYSTERECTOMY     ANTERIOR (CYSTOCELE) AND POSTERIOR REPAIR (RECTOCELE) WITH XENFORM GRAFT AND SACROSPINOUS FIXATION     CATARACT EXTRACTION, BILATERAL     INGUINAL HERNIA REPAIR Left    LUMBAR LAMINECTOMY/DECOMPRESSION MICRODISCECTOMY Right 10/12/2021   Procedure: Right Lumbar four-five Laminectomy for facet/synovial cyst;  Surgeon: Eustace Moore, MD;  Location: [facility];  Service: Neurosurgery;  Laterality: Right;   Patient Active Problem List   Diagnosis Date Noted   Tinnitus of left ear 07/03/2022   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 07/03/2022   Decreased GFR 04/17/2022   Medication management 04/16/2022   Synovial cyst of lumbar facet joint 09/19/2021   DDD (degenerative disc disease), lumbar 04/11/2021   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 03/09/2021   Palpitations 09/05/2020   SOB (shortness of breath) 09/05/2020   Chest pain 09/05/2020   Mixed hyperlipidemia 06/06/2020   Primary insomnia 06/06/2020    Primary parkinsonism 10/26/2019   Osteoporosis 09/15/2019   Blister of great toe of left foot 08/19/2019   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 02/09/2019   Shuffling gait 02/09/2019   Balance problems 02/09/2019   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   Orthostatic hypotension 02/01/2019   Dizziness 02/01/2019   Primary osteoarthritis of right foot 09/25/2018   Snoring 09/25/2018   Night terrors, adult 09/25/2018   Multiple falls 09/25/2018   Metatarsalgia of left foot 08/20/2018   Chronic foot pain, right 08/20/2018   Left hip pain 04/10/2017   History of shingles 04/08/2017   Adenomatous polyp 04/08/2017   Pure hypercholesterolemia 04/08/2017   History of kidney stones 04/08/2017   Essential hypertension 04/08/2017   Depression, recurrent ([place]) 04/08/2017   Anxiety 04/08/2017   Greater trochanteric bursitis of left hip 04/08/2017    ONSET DATE: 09/10/22  REFERRING DIAG: Parkinson's Disease  THERAPY DIAG:  Muscle weakness (generalized)  Other abnormalities of gait and mobility  Unsteadiness on feet  Rationale for Evaluation and Treatment Rehabilitation  SUBJECTIVE:    SUBJECTIVE STATEMENT: The patient reports she had muscle soreness after last session in arms and legs.  PERTINENT HISTORY: h/o parkinsonism,   PAIN:  Are you having pain? No She notes she needs  surgery for her kidney stones.  PATIENT GOALS I want to be able to walk more normally (not shuffle, not bend forward)  OBJECTIVE:   ________________________________________________________________________________________________________________________________________ From initial eval LOWER EXTREMITY MMT:    MMT Right Eval Left Eval  Hip flexion 3+/5 3+/5  Hip extension    Hip abduction    Hip adduction    Hip internal rotation    Hip external rotation    Knee flexion 5/5 5/5   Knee extension 5/5 5/5  Ankle dorsiflexion 5/5 5/5  (Blank rows = not tested)  FUNCTIONAL TESTs:  5 times sit to stand: 17 seconds Timed up and go (TUG): 11 10 meter walk test: 2.86 ft/sec  Mini Best 14 seconds _______________________________________________________________________________________________________________________________________   TODAY'S TREATMENT:  10/02/22: THEREX T position with lateral clapping R and L x 10 reps Alternating rock and reach with LEs only working on hip position and weight shifting x 10 reps  NMR Wall bumps on solid surfaces x 10 reps Compliant surface standing with wall bumps x 10 reps Posterior stepping off edge of compliant foam Rocker board ant/posterior with proactive and reactive balance   Large amplitude marching with alternating UEs   [facility] PT Assessment - 10/02/22 1110       Standardized Balance Assessment   Standardized Balance Assessment Timed Up and Go Test;Mini-BESTest      Mini-BESTest   Sit To Stand Normal: Comes to stand without use of hands and stabilizes independently.    Rise to Toes Moderate: Heels up, but not full range (smaller than when holding hands), OR noticeable instability for 3 s.    Stand on one leg (left) Moderate: < 20 s    Stand on one leg (right) Moderate: < 20 s    Stand on one leg - lowest score 1    Compensatory Stepping Correction - Forward Moderate: More than one step is required to recover equilibrium    Compensatory Stepping Correction - Backward No step, OR would fall if not caught, OR falls spontaneously.    Compensatory Stepping Correction - Left Lateral Moderate: Several steps to recover equilibrium    Compensatory Stepping Correction - Right Lateral Moderate: Several steps to recover equilibrium    Stepping Corredtion Lateral - lowest score 1    Stance - Feet together, eyes open, firm surface  Normal: 30s    Stance - Feet together, eyes closed, foam surface  Normal: 30s    Incline - Eyes  Closed Severe: Unable    Change in Gait Speed Normal: Significantly changes walkling speed without imbalance    Walk with head turns - Horizontal Moderate: performs head turns with reduction in gait speed.    Walk with pivot turns Normal: Turns with feet close FAST (< 3 steps) with good balance.    Step over obstacles Moderate: Steps over box but touches box OR displays cautious behavior by slowing gait.    Timed UP & GO with Dual Task Severe: Stops counting while walking OR stops walking while counting.    Mini-BEST total score 16      Timed Up and Go Test   Normal TUG (seconds) 11    Cognitive TUG (seconds) 16.47    TUG Comments 16/28            09/30/22: THEREX Treadmill x at 3% incline up to 1.4 mph with intermittent UE Support Sit to stand with UE reach and posture up position x 10 reps Standing trunk twist + weight shifting with T to clap  R and T to clap L x 10 reps near support if needed Lateral weight shifting x 10 reps R and L with demo cues Seated trunk extension x 10 reps focusing on upright posture Crouch to tall kneel x 10 reps Crouch to 1/2 kneel-- unable to do with tactile and demo cues Supine rotation T to sidelying x 5 reps each Supine large amplitude marching x 10 reps R and L sides Bridges x 10 reps with cues Alternating step reach anteriorly and bow and lean posteriorly with SBA for safety  09/25/22 Nustep L5 x 5 min for warm up  Gait ladder focusing on increasing step length forward, laterally with CGA Backward gait with CGA Gait on treadmill grade 2 incline 0.8 mph 3 x 1 min  Mini squat with focus on full extension in standing 2 x 10 Step ups x 10 bilat with focus on full extension Rockerboard x 1 min each A/P and laterally with intermittent UE support Side to side step with reaching Alternating forward step with overhead reach Sit <> stand 2 x 10 focus on full extension  HOME EXERCISE PROGRAM: Access Code: [email or web address] URL:  [email or web address] Date: 09/30/2022 Prepared by: Rudell Cobb  Exercises - Sit to Stand with Arm Swing  - 1-2 x daily - 7 x weekly - 2 sets - 10 reps - Side to Side Weight Shift with Overhead Reach and Counter Support  - 1-2 x daily - 7 x weekly - 2 sets - 10 reps - Staggered Stance Weight Shift with Arms Reaching  - 1-2 x daily - 7 x weekly - 2 sets - 10 reps - Alternating Step Backward with Support  - 1 x daily - 7 x weekly - 2 sets - 10 reps - Alternating Side Step  - 1-2 x daily - 7 x weekly - 2 sets - 10 reps  GOALS: Goals reviewed with patient? Yes  SHORT TERM GOALS: Target date: 10/14/2022  The patient will be indep with initial HEP for large amplitude movements. Baseline:No HEP Goal status: ACHIEVED  2.  The patient will reduce 5 time sit<>stand to < or equal to 14 seconds. Baseline: 17 seconds. Goal status: IN PROGRESS, 14.07 seconds on 09/30/22  3.  The patient will improve miniBEST test to > or equal to 18/28. Baseline: 16/28 ON 10/02/22 Goal status: IN PROGRESS  4.  The patient will improve TUG cognitive to < or equal to 15 seconds. Baseline: 19 seconds Goal status: IN PROGRESS, 16.28 ON 10/02/22  5.  The patient will move floor<>stand with UE support mod indep. Baseline: reports independence Goal status: ACHIEVED  LONG TERM GOALS: Target date: 11/11/2022  The patient will be indep with HEP progression. Baseline: no HEP Goal status:IN PROGRESS  2.  The patient will improve mini Best test to > or equal to 20/28  Baseline: 14/28 Goal status:IN PROGRESS  3.  The patient will verbalize understanding of community resources for PD including POP and PWR community classes. Baseline: No participation in community programs. Goal status: IN PROGRESS  4.  The patient will verbalize 3 tips to reduce freezing during gait. Baseline: Reports festination during gait. Goal status:IN PROGRESS  ASSESSMENT:  CLINICAL IMPRESSION: The patient has met  2 STGs.  She is on wait list for next week and has a surgical procedure the following week.  PT plans to continue at 1x/week working on initiating transition to community exercise PWR class at [place].  Plan to continue working towards goals and beginning community classes.  PLAN: PT FREQUENCY: 2x/week  PT DURATION: 8 weeks  PLANNED INTERVENTIONS: Therapeutic exercises, Therapeutic activity, Neuromuscular re-education, Balance training, Gait training, Patient/Family education, Self Care, Joint mobilization, and Joint manipulation  PLAN FOR NEXT SESSION:  Large amplitude movements, dynamic gait training, high level balance, postural control, prior to d/c plan to provide POP information and PWR @ [place] information.  *check STGs for 10/30.  [place], PT 10/02/2022, 11:05 AM

## 2022-10-09 DIAGNOSIS — N200 Calculus of kidney: Secondary | ICD-10-CM | POA: Diagnosis not present

## 2022-10-11 ENCOUNTER — Ambulatory Visit: Payer: Medicare Other | Admitting: Physical Therapy

## 2022-10-11 ENCOUNTER — Encounter: Payer: Self-pay | Admitting: Physical Therapy

## 2022-10-11 DIAGNOSIS — R2689 Other abnormalities of gait and mobility: Secondary | ICD-10-CM

## 2022-10-11 DIAGNOSIS — R29818 Other symptoms and signs involving the nervous system: Secondary | ICD-10-CM | POA: Diagnosis not present

## 2022-10-11 DIAGNOSIS — M6281 Muscle weakness (generalized): Secondary | ICD-10-CM

## 2022-10-11 DIAGNOSIS — R2681 Unsteadiness on feet: Secondary | ICD-10-CM

## 2022-10-11 DIAGNOSIS — G20A1 Parkinson's disease without dyskinesia, without mention of fluctuations: Secondary | ICD-10-CM | POA: Diagnosis not present

## 2022-10-11 NOTE — Therapy (Signed)
OUTPATIENT PHYSICAL THERAPY TREATMENT   Patient Name: Chelsey Estes MRN: [id number] DOB:27-Dec-1940, 81 y.o., female Today's Date: 10/11/2022   PCP: Iran Planas, [place] REFERRING PROVIDER: Alonza Bogus, DO   PT End of Session - 10/11/22 1017     Visit Number 7    Number of Visits 16    Date for PT Re-Evaluation 11/15/22    Authorization Type medicare and BCBS federal    Authorization - Visit Number 7    Progress Note Due on Visit 10    PT Start Time 1020    PT Stop Time 1100    PT Time Calculation (min) 40 min    Activity Tolerance Patient tolerated treatment well    Behavior During Therapy WFL for tasks assessed/performed                Past Medical History:  Diagnosis Date   Anxiety    Arthritis    Depression    Glaucoma    History of kidney stones    7 lithrotripsy   Hyperlipidemia    Hypertension    Parkinson disease    Past Surgical History:  Procedure Laterality Date   ABDOMINAL HYSTERECTOMY     ANTERIOR (CYSTOCELE) AND POSTERIOR REPAIR (RECTOCELE) WITH XENFORM GRAFT AND SACROSPINOUS FIXATION     CATARACT EXTRACTION, BILATERAL     INGUINAL HERNIA REPAIR Left    LUMBAR LAMINECTOMY/DECOMPRESSION MICRODISCECTOMY Right 10/12/2021   Procedure: Right Lumbar four-five Laminectomy for facet/synovial cyst;  Surgeon: Eustace Moore, MD;  Location: [facility];  Service: Neurosurgery;  Laterality: Right;   Patient Active Problem List   Diagnosis Date Noted   Tinnitus of left ear 07/03/2022   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 07/03/2022   Decreased GFR 04/17/2022   Medication management 04/16/2022   Synovial cyst of lumbar facet joint 09/19/2021   DDD (degenerative disc disease), lumbar 04/11/2021   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 03/09/2021   Palpitations 09/05/2020   SOB (shortness of breath) 09/05/2020   Chest pain 09/05/2020   Mixed hyperlipidemia 06/06/2020   Primary insomnia 06/06/2020    Primary parkinsonism 10/26/2019   Osteoporosis 09/15/2019   Blister of great toe of left foot 08/19/2019   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   Moderate episode of recurrent major depressive disorder ([place]) 02/09/2019   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 02/09/2019   Shuffling gait 02/09/2019   Balance problems 02/09/2019   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   Orthostatic hypotension 02/01/2019   Dizziness 02/01/2019   Primary osteoarthritis of right foot 09/25/2018   Snoring 09/25/2018   Night terrors, adult 09/25/2018   Multiple falls 09/25/2018   Metatarsalgia of left foot 08/20/2018   Chronic foot pain, right 08/20/2018   Left hip pain 04/10/2017   History of shingles 04/08/2017   Adenomatous polyp 04/08/2017   Pure hypercholesterolemia 04/08/2017   History of kidney stones 04/08/2017   Essential hypertension 04/08/2017   Depression, recurrent ([place]) 04/08/2017   Anxiety 04/08/2017   Greater trochanteric bursitis of left hip 04/08/2017    ONSET DATE: 09/10/22  REFERRING DIAG: Parkinson's Disease  THERAPY DIAG:  Muscle weakness (generalized)  Other abnormalities of gait and mobility  Unsteadiness on feet  Other symptoms and signs involving the nervous system  Balance disorder  Rationale for Evaluation and Treatment Rehabilitation  SUBJECTIVE:    SUBJECTIVE STATEMENT: Pt states she will be having surgery on Halloween.   PERTINENT HISTORY: h/o parkinsonism,   PAIN:  Are  you having pain? No She notes she needs surgery for her kidney stones.  PATIENT GOALS I want to be able to walk more normally (not shuffle, not bend forward)  OBJECTIVE:   ________________________________________________________________________________________________________________________________________ From initial eval LOWER EXTREMITY MMT:    MMT Right Eval Left Eval  Hip flexion 3+/5 3+/5  Hip extension    Hip abduction    Hip adduction    Hip internal rotation    Hip  external rotation    Knee flexion 5/5 5/5  Knee extension 5/5 5/5  Ankle dorsiflexion 5/5 5/5  (Blank rows = not tested)  FUNCTIONAL TESTs: From eval  5 times sit to stand: 17 seconds Timed up and go (TUG): 11 10 meter walk test: 2.86 ft/sec  Mini Best 14 seconds  10/27 5 times sit to stand: 13 sec TUG: 11 sec  [facility] PT Assessment - 10/11/22 0001       Mini-BESTest   Sit To Stand Normal: Comes to stand without use of hands and stabilizes independently.    Rise to Toes Normal: Stable for 3 s with maximum height.    Stand on one leg (left) Moderate: < 20 s    Stand on one leg (right) Moderate: < 20 s    Stand on one leg - lowest score 1    Compensatory Stepping Correction - Forward No step, OR would fall if not caught, OR falls spontaneously.    Compensatory Stepping Correction - Backward No step, OR would fall if not caught, OR falls spontaneously.    Compensatory Stepping Correction - Left Lateral Moderate: Several steps to recover equilibrium    Compensatory Stepping Correction - Right Lateral Moderate: Several steps to recover equilibrium    Stepping Corredtion Lateral - lowest score 1    Stance - Feet together, eyes open, firm surface  Normal: 30s    Stance - Feet together, eyes closed, foam surface  Moderate: < 30s   Had to use UEs for stability   Incline - Eyes Closed Normal: Stands independently 30s and aligns with gravity    Change in Gait Speed Normal: Significantly changes walkling speed without imbalance    Walk with head turns - Horizontal Normal: performs head turns with no change in gait speed and good balance    Walk with pivot turns Normal: Turns with feet close FAST (< 3 steps) with good balance.    Step over obstacles Moderate: Steps over box but touches box OR displays cautious behavior by slowing gait.    Timed UP & GO with Dual Task Moderate: Dual Task affects either counting OR walking (>10%) when compared to the TUG without Dual Task.    Mini-BEST total  score 19      Timed Up and Go Test   Normal TUG (seconds) 11    Cognitive TUG (seconds) 12              _______________________________________________________________________________________________________________________________________    TODAY'S TREATMENT:  10/11/22: THEREX Nustep L5 x 5 min UEs/LEs Standing Gastroc stretch 2x30 sec R&L Standing Hamstring stretch x30 sec R&L  NMR On airex Feet apart and then feet together EO x30 sec each Step back/forward over dowel x10 R & L LE   10/02/22: THEREX T position with lateral clapping R and L x 10 reps Alternating rock and reach with LEs only working on hip position and weight shifting x 10 reps  NMR Wall bumps on solid surfaces x 10 reps Compliant surface standing with wall bumps x 10 reps  Posterior stepping off edge of compliant foam Rocker board ant/posterior with proactive and reactive balance   Large amplitude marching with alternating UEs   [facility] PT Assessment - 10/11/22 0001       Mini-BESTest   Sit To Stand Normal: Comes to stand without use of hands and stabilizes independently.    Rise to Toes Normal: Stable for 3 s with maximum height.    Stand on one leg (left) Moderate: < 20 s    Stand on one leg (right) Moderate: < 20 s    Stand on one leg - lowest score 1    Compensatory Stepping Correction - Forward No step, OR would fall if not caught, OR falls spontaneously.    Compensatory Stepping Correction - Backward No step, OR would fall if not caught, OR falls spontaneously.    Compensatory Stepping Correction - Left Lateral Moderate: Several steps to recover equilibrium    Compensatory Stepping Correction - Right Lateral Moderate: Several steps to recover equilibrium    Stepping Corredtion Lateral - lowest score 1    Stance - Feet together, eyes open, firm surface  Normal: 30s    Stance - Feet together, eyes closed, foam surface  Moderate: < 30s   Had to use UEs for stability   Incline - Eyes Closed  Normal: Stands independently 30s and aligns with gravity    Change in Gait Speed Normal: Significantly changes walkling speed without imbalance    Walk with head turns - Horizontal Normal: performs head turns with no change in gait speed and good balance    Walk with pivot turns Normal: Turns with feet close FAST (< 3 steps) with good balance.    Step over obstacles Moderate: Steps over box but touches box OR displays cautious behavior by slowing gait.    Timed UP & GO with Dual Task Moderate: Dual Task affects either counting OR walking (>10%) when compared to the TUG without Dual Task.    Mini-BEST total score 19      Timed Up and Go Test   Normal TUG (seconds) 11    Cognitive TUG (seconds) 12             09/30/22: THEREX Treadmill x at 3% incline up to 1.4 mph with intermittent UE Support Sit to stand with UE reach and posture up position x 10 reps Standing trunk twist + weight shifting with T to clap R and T to clap L x 10 reps near support if needed Lateral weight shifting x 10 reps R and L with demo cues Seated trunk extension x 10 reps focusing on upright posture Crouch to tall kneel x 10 reps Crouch to 1/2 kneel-- unable to do with tactile and demo cues Supine rotation T to sidelying x 5 reps each Supine large amplitude marching x 10 reps R and L sides Bridges x 10 reps with cues Alternating step reach anteriorly and bow and lean posteriorly with SBA for safety  09/25/22 Nustep L5 x 5 min for warm up  Gait ladder focusing on increasing step length forward, laterally with CGA Backward gait with CGA Gait on treadmill grade 2 incline 0.8 mph 3 x 1 min  Mini squat with focus on full extension in standing 2 x 10 Step ups x 10 bilat with focus on full extension Rockerboard x 1 min each A/P and laterally with intermittent UE support Side to side step with reaching Alternating forward step with overhead reach Sit <> stand 2 x 10 focus  on full extension  HOME EXERCISE  PROGRAM: Access Code: [email or web address] URL: [email or web address] Date: 09/30/2022 Prepared by: Rudell Cobb  Exercises - Sit to Stand with Arm Swing  - 1-2 x daily - 7 x weekly - 2 sets - 10 reps - Side to Side Weight Shift with Overhead Reach and Counter Support  - 1-2 x daily - 7 x weekly - 2 sets - 10 reps - Staggered Stance Weight Shift with Arms Reaching  - 1-2 x daily - 7 x weekly - 2 sets - 10 reps - Alternating Step Backward with Support  - 1 x daily - 7 x weekly - 2 sets - 10 reps - Alternating Side Step  - 1-2 x daily - 7 x weekly - 2 sets - 10 reps  GOALS: Goals reviewed with patient? Yes  SHORT TERM GOALS: Target date: 10/14/2022  The patient will be indep with initial HEP for large amplitude movements. Baseline:No HEP Goal status: ACHIEVED  2.  The patient will reduce 5 time sit<>stand to < or equal to 14 seconds. Baseline: 17 seconds. Goal status: ACHIEVED, 13 sec 10/11/22  3.  The patient will improve miniBEST test to > or equal to 18/28. Baseline: 16/28 ON 10/02/22 Goal status: ACHIEVED, 19/28 on 10/11/22  4.  The patient will improve TUG cognitive to < or equal to 15 seconds. Baseline: 19 seconds Goal status: ACHIEVED, 11 sec on 10/11/22  5.  The patient will move floor<>stand with UE support mod indep. Baseline: reports independence Goal status: ACHIEVED  LONG TERM GOALS: Target date: 11/11/2022  The patient will be indep with HEP progression. Baseline: no HEP Goal status:IN PROGRESS  2.  The patient will improve mini Best test to > or equal to 20/28  Baseline: 14/28 Goal status:IN PROGRESS  3.  The patient will verbalize understanding of community resources for PD including POP and PWR community classes. Baseline: No participation in community programs. Goal status: IN PROGRESS  4.  The patient will verbalize 3 tips to reduce freezing during gait. Baseline: Reports festination during gait. Goal status:IN  PROGRESS  ASSESSMENT:  CLINICAL IMPRESSION: Pt to get multiple procedures next week and will need medical clearance to resume PT. PT re-checked goals -- she has continued to slowly meet her goals. Greatest difficulty with compensatory stepping, compliant surfaces, and angled/sloped surfaces. She has fully achieved her STGs. Will continue to work towards [place].   PLAN: PT FREQUENCY: 2x/week  PT DURATION: 8 weeks  PLANNED INTERVENTIONS: Therapeutic exercises, Therapeutic activity, Neuromuscular re-education, Balance training, Gait training, Patient/Family education, Self Care, Joint mobilization, and Joint manipulation  PLAN FOR NEXT SESSION:  Large amplitude movements, dynamic gait training, high level balance, postural control, prior to d/c plan to provide POP information and PWR @ [place] information.    Natali Lavallee April Ma L Adriel Desrosier, PT 10/11/2022, 10:56 AM

## 2022-10-14 ENCOUNTER — Other Ambulatory Visit: Payer: Self-pay | Admitting: Neurology

## 2022-10-14 MED ORDER — SIMVASTATIN 20 MG PO TABS: 20.0000 mg | ORAL_TABLET | Freq: Every day | ORAL | 0 refills | Status: DC

## 2022-10-15 DIAGNOSIS — I10 Essential (primary) hypertension: Secondary | ICD-10-CM | POA: Diagnosis not present

## 2022-10-15 DIAGNOSIS — M1990 Unspecified osteoarthritis, unspecified site: Secondary | ICD-10-CM | POA: Diagnosis not present

## 2022-10-15 DIAGNOSIS — E785 Hyperlipidemia, unspecified: Secondary | ICD-10-CM | POA: Diagnosis not present

## 2022-10-15 DIAGNOSIS — N200 Calculus of kidney: Secondary | ICD-10-CM | POA: Diagnosis not present

## 2022-10-16 DIAGNOSIS — G20A1 Parkinson's disease without dyskinesia, without mention of fluctuations: Secondary | ICD-10-CM | POA: Diagnosis not present

## 2022-10-16 DIAGNOSIS — N3281 Overactive bladder: Secondary | ICD-10-CM | POA: Diagnosis not present

## 2022-10-16 DIAGNOSIS — I10 Essential (primary) hypertension: Secondary | ICD-10-CM | POA: Diagnosis not present

## 2022-10-16 DIAGNOSIS — Z79899 Other long term (current) drug therapy: Secondary | ICD-10-CM | POA: Diagnosis not present

## 2022-10-16 DIAGNOSIS — Z888 Allergy status to other drugs, medicaments and biological substances status: Secondary | ICD-10-CM | POA: Diagnosis not present

## 2022-10-16 DIAGNOSIS — M810 Age-related osteoporosis without current pathological fracture: Secondary | ICD-10-CM | POA: Diagnosis not present

## 2022-10-16 DIAGNOSIS — N200 Calculus of kidney: Secondary | ICD-10-CM | POA: Diagnosis not present

## 2022-10-16 DIAGNOSIS — E785 Hyperlipidemia, unspecified: Secondary | ICD-10-CM | POA: Diagnosis not present

## 2022-10-16 DIAGNOSIS — Z87442 Personal history of urinary calculi: Secondary | ICD-10-CM | POA: Diagnosis not present

## 2022-10-17 DIAGNOSIS — G20A1 Parkinson's disease without dyskinesia, without mention of fluctuations: Secondary | ICD-10-CM | POA: Diagnosis not present

## 2022-10-17 DIAGNOSIS — I10 Essential (primary) hypertension: Secondary | ICD-10-CM | POA: Diagnosis not present

## 2022-10-17 DIAGNOSIS — N200 Calculus of kidney: Secondary | ICD-10-CM | POA: Diagnosis not present

## 2022-10-17 DIAGNOSIS — M810 Age-related osteoporosis without current pathological fracture: Secondary | ICD-10-CM | POA: Diagnosis not present

## 2022-10-17 DIAGNOSIS — N3281 Overactive bladder: Secondary | ICD-10-CM | POA: Diagnosis not present

## 2022-10-17 DIAGNOSIS — E785 Hyperlipidemia, unspecified: Secondary | ICD-10-CM | POA: Diagnosis not present

## 2022-10-18 ENCOUNTER — Ambulatory Visit: Payer: Medicare Other | Admitting: Physician Assistant

## 2022-10-21 ENCOUNTER — Other Ambulatory Visit: Payer: Self-pay | Admitting: Physician Assistant

## 2022-10-21 DIAGNOSIS — Z0000 Encounter for general adult medical examination without abnormal findings: Secondary | ICD-10-CM

## 2022-10-21 DIAGNOSIS — Z780 Asymptomatic menopausal state: Secondary | ICD-10-CM

## 2022-10-25 ENCOUNTER — Other Ambulatory Visit: Payer: Self-pay | Admitting: Physician Assistant

## 2022-10-25 DIAGNOSIS — F339 Major depressive disorder, recurrent, unspecified: Secondary | ICD-10-CM

## 2022-10-25 DIAGNOSIS — F419 Anxiety disorder, unspecified: Secondary | ICD-10-CM

## 2022-10-28 ENCOUNTER — Ambulatory Visit (INDEPENDENT_AMBULATORY_CARE_PROVIDER_SITE_OTHER): Payer: Medicare Other | Admitting: Physician Assistant

## 2022-10-28 DIAGNOSIS — Z0000 Encounter for general adult medical examination without abnormal findings: Secondary | ICD-10-CM

## 2022-10-28 NOTE — Patient Instructions (Addendum)
[place] Maintenance Summary and Written Plan of Care  Chelsey Estes ,  Thank you for allowing me to perform your Medicare Annual Wellness Visit and for your ongoing commitment to your health.   Health Maintenance & Immunization History Health Maintenance  Topic Date Due   COVID-19 Vaccine (7 - Pfizer risk series) 11/13/2022 (Originally 11/10/2022)   Zoster Vaccines- Shingrix (1 of 2) 01/28/2023 (Originally 03/20/1960)   MAMMOGRAM  05/17/2023   Medicare Annual Wellness (AWV)  10/29/2023   TETANUS/TDAP  10/09/2027   Pneumonia Vaccine 57+ Years old  Completed   INFLUENZA VACCINE  Completed   DEXA SCAN  Completed   HPV VACCINES  Aged Out   Immunization History  Administered Date(s) Administered   Fluad Quad(high Dose 65+) 09/15/2020, 09/04/2021   Influenza, High Dose Seasonal PF 09/29/2022   Influenza,inj,Quad PF,6+ Mos 08/19/2019   Influenza-Unspecified 09/15/2018   PFIZER(Purple Top)SARS-COV-2 Vaccination 01/09/2020, 01/31/2020, 07/29/2020, 05/02/2021, 09/04/2021   PNEUMOCOCCAL CONJUGATE-20 10/09/2021   Pfizer Covid-19 Vaccine Bivalent Booster 59yr & up 09/15/2022   Pneumococcal Polysaccharide-23 09/25/2018, 06/05/2020   Tdap 10/08/2017    These are the patient goals that we discussed:  Goals Addressed               This Visit's Progress     Patient Stated (pt-stated)        Patient would like to work on her strength in her legs.          This is a list of Health Maintenance Items that are overdue or due now: Shingrix    Orders/Referrals Placed Today: No orders of the defined types were placed in this encounter.  (Contact our referral department at [phone] you have not spoken with someone about your referral appointment within the next 5 days)    Follow-up Plan Follow-up with BDonella Stade PA-C as planned Patient will schedule Shingrix vaccine at the pharmacy.  Medicare wellness visit in one year.  Patient will  access AVS on my chart.      Health Maintenance, Female Adopting a healthy lifestyle and getting preventive care are important in promoting health and wellness. Ask your health care provider about: The right schedule for you to have regular tests and exams. Things you can do on your own to prevent diseases and keep yourself healthy. What should I know about diet, weight, and exercise? Eat a healthy diet  Eat a diet that includes plenty of vegetables, fruits, low-fat dairy products, and lean protein. Do not eat a lot of foods that are high in solid fats, added sugars, or sodium. Maintain a healthy weight Body mass index (BMI) is used to identify weight problems. It estimates body fat based on height and weight. Your health care provider can help determine your BMI and help you achieve or maintain a healthy weight. Get regular exercise Get regular exercise. This is one of the most important things you can do for your health. Most adults should: Exercise for at least 150 minutes each week. The exercise should increase your heart rate and make you sweat (moderate-intensity exercise). Do strengthening exercises at least twice a week. This is in addition to the moderate-intensity exercise. Spend less time sitting. Even light physical activity can be beneficial. Watch cholesterol and blood lipids Have your blood tested for lipids and cholesterol at 81years of age, then have this test every 5 years. Have your cholesterol levels checked more often if: Your lipid or cholesterol levels are high. You are  older than 81 years of age. You are at high risk for heart disease. What should I know about cancer screening? Depending on your health history and family history, you may need to have cancer screening at various ages. This may include screening for: Breast cancer. Cervical cancer. Colorectal cancer. Skin cancer. Lung cancer. What should I know about heart disease, diabetes, and high blood  pressure? Blood pressure and heart disease High blood pressure causes heart disease and increases the risk of stroke. This is more likely to develop in people who have high blood pressure readings or are overweight. Have your blood pressure checked: Every 3-5 years if you are 61-52 years of age. Every year if you are 67 years old or older. Diabetes Have regular diabetes screenings. This checks your fasting blood sugar level. Have the screening done: Once every three years after age 38 if you are at a normal weight and have a low risk for diabetes. More often and at a younger age if you are overweight or have a high risk for diabetes. What should I know about preventing infection? Hepatitis B If you have a higher risk for hepatitis B, you should be screened for this virus. Talk with your health care provider to find out if you are at risk for hepatitis B infection. Hepatitis C Testing is recommended for: Everyone born from [birth year] through 1965. Anyone with known risk factors for hepatitis C. Sexually transmitted infections (STIs) Get screened for STIs, including gonorrhea and chlamydia, if: You are sexually active and are younger than 81 years of age. You are older than 81 years of age and your health care provider tells you that you are at risk for this type of infection. Your sexual activity has changed since you were last screened, and you are at increased risk for chlamydia or gonorrhea. Ask your health care provider if you are at risk. Ask your health care provider about whether you are at high risk for HIV. Your health care provider may recommend a prescription medicine to help prevent HIV infection. If you choose to take medicine to prevent HIV, you should first get tested for HIV. You should then be tested every 3 months for as long as you are taking the medicine. Pregnancy If you are about to stop having your period (premenopausal) and you may become pregnant, seek counseling before you  get pregnant. Take 400 to 800 micrograms (mcg) of folic acid every day if you become pregnant. Ask for birth control (contraception) if you want to prevent pregnancy. Osteoporosis and menopause Osteoporosis is a disease in which the bones lose minerals and strength with aging. This can result in bone fractures. If you are 40 years old or older, or if you are at risk for osteoporosis and fractures, ask your health care provider if you should: Be screened for bone loss. Take a calcium or vitamin D supplement to lower your risk of fractures. Be given hormone replacement therapy (HRT) to treat symptoms of menopause. Follow these instructions at home: Alcohol use Do not drink alcohol if: Your health care provider tells you not to drink. You are pregnant, may be pregnant, or are planning to become pregnant. If you drink alcohol: Limit how much you have to: 0-1 drink a day. Know how much alcohol is in your drink. In the [place] one drink equals one 12 oz bottle of beer (355 mL), one 5 oz glass of wine (148 mL), or one 1 oz glass of hard liquor (44 mL). Lifestyle  Do not use any products that contain nicotine or tobacco. These products include cigarettes, chewing tobacco, and vaping devices, such as e-cigarettes. If you need help quitting, ask your health care provider. Do not use street drugs. Do not share needles. Ask your health care provider for help if you need support or information about quitting drugs. General instructions Schedule regular health, dental, and eye exams. Stay current with your vaccines. Tell your health care provider if: You often feel depressed. You have ever been abused or do not feel safe at home. Summary Adopting a healthy lifestyle and getting preventive care are important in promoting health and wellness. Follow your health care provider's instructions about healthy diet, exercising, and getting tested or screened for diseases. Follow your health care provider's  instructions on monitoring your cholesterol and blood pressure. This information is not intended to replace advice given to you by your health care provider. Make sure you discuss any questions you have with your health care provider. Document Revised: 04/23/2021 Document Reviewed: 04/23/2021 Elsevier Patient Education  [place].

## 2022-10-28 NOTE — Progress Notes (Signed)
MEDICARE ANNUAL WELLNESS VISIT  10/28/2022  Telephone Visit Disclaimer This Medicare AWV was conducted by telephone due to national recommendations for restrictions regarding the COVID-19 Pandemic (e.g. social distancing).  I verified, using two identifiers, that I am speaking with Chelsey Estes or their authorized healthcare agent. I discussed the limitations, risks, security, and privacy concerns of performing an evaluation and management service by telephone and the potential availability of an in-person appointment in the future. The patient expressed understanding and agreed to proceed.  Location of Patient: Home  Location of Provider (nurse):  Provider home  Subjective:    Chelsey Estes is a 81 y.o. female patient of [name], [name], PA-C who had a [employer] Visit today via telephone. Chelsey Estes is Retired and lives alone. she has 2 children. she reports that she is socially active and does interact with friends/family regularly. she is minimally physically active and enjoys enjoys reading, playing games and spending time with her dogs (when she can).   Patient Care Team: [name] as PCP - General (Family Medicine)     10/28/2022    8:03 AM 09/16/2022    3:04 PM 09/10/2022   10:55 AM 02/20/2022   12:15 PM 10/24/2021    8:07 AM 10/12/2021   10:32 AM 09/13/2021   10:00 AM  Advanced Directives  Does Patient Have a Medical Advance Directive? No No Yes No No No No  Would patient like information on creating a medical advance directive? No - Patient declined Yes (MAU/Ambulatory/Procedural Areas - Information given)   No - Patient declined No - Patient declined     Hospital Utilization Over the Past 12 Months: # of hospitalizations or ER visits: 1 # of surgeries: 1  Review of Systems    Patient reports that her overall health is better compared to last year.  History obtained from chart review and the patient  Patient Reported Readings (BP, Pulse, CBG,  Weight, etc) none  Pain Assessment Pain : No/denies pain     Current Medications & Allergies (verified) Allergies as of 10/28/2022       Reactions   Trazodone And Nefazodone Nausea Only, Other (See Comments)   Nausea/dizzines/cramps        Medication List        Accurate as of October 28, 2022  8:18 AM. If you have any questions, ask your nurse or doctor.          Alphagan P 0.1 % Soln Generic drug: brimonidine Place 1 drop into both eyes in the morning and at bedtime.   Belsomra 10 MG Tabs Generic drug: Suvorexant Take 10 mg by mouth at bedtime.   buPROPion 300 MG 24 hr tablet Commonly known as: WELLBUTRIN XL TAKE 1 TABLET BY MOUTH DAILY   carbidopa-levodopa 25-100 MG tablet Commonly known as: SINEMET IR TAKE 1 TABLET BY MOUTH THREE TIMES DAILY   cholecalciferol 25 MCG (1000 UNIT) tablet Commonly known as: VITAMIN D3 Take 1,000 Units by mouth daily.   cyanocobalamin 2000 MCG tablet Take 2,000 mcg by mouth daily.   doxycycline 100 MG capsule Commonly known as: VIBRAMYCIN Take by mouth.   lisinopril 5 MG tablet Commonly known as: ZESTRIL TAKE 1 TABLET BY MOUTH TWICE DAILY   Myrbetriq 25 MG Tb24 tablet Generic drug: mirabegron ER Take 25 mg by mouth daily.   pyridoxine 100 MG tablet Commonly known as: B-6 Take 1 tablet by mouth daily.   simvastatin 20 MG tablet Commonly known as: ZOCOR  Take 1 tablet (20 mg total) by mouth at bedtime.   timolol 0.5 % ophthalmic solution Commonly known as: TIMOPTIC Place 1 drop into both eyes daily.   Vitamin K2 100 MCG Caps Take 1 tablet by mouth daily.        History (reviewed): Past Medical History:  Diagnosis Date   Anxiety    Arthritis    Depression    Glaucoma    History of kidney stones    7 lithrotripsy   Hyperlipidemia    Hypertension    Parkinson disease    Past Surgical History:  Procedure Laterality Date   ABDOMINAL HYSTERECTOMY     ANTERIOR (CYSTOCELE) AND POSTERIOR REPAIR  (RECTOCELE) WITH XENFORM GRAFT AND SACROSPINOUS FIXATION     CATARACT EXTRACTION, BILATERAL     INGUINAL HERNIA REPAIR Left    LUMBAR LAMINECTOMY/DECOMPRESSION MICRODISCECTOMY Right 10/12/2021   Procedure: Right Lumbar four-five Laminectomy for facet/synovial cyst;  Surgeon: Eustace Moore, MD;  Location: [facility];  Service: Neurosurgery;  Laterality: Right;   Family History  Problem Relation Age of Onset   Alcohol abuse Father    Cancer Father        esophageal   Stroke Maternal Grandfather    Alcohol abuse Paternal Grandfather    Alzheimer's disease Mother    Social History   Socioeconomic History   Marital status: Widowed    Spouse name: Ron   Number of children: 2   Years of education: 14   Highest education level: Some college, no degree  Occupational History   Occupation: Educational psychologist- Psychologist, prison and probation services    Comment: retired  Tobacco Use   Smoking status: Never   Smokeless tobacco: Never  Scientific laboratory technician Use: Never used  Substance and Sexual Activity   Alcohol use: Never   Drug use: Never   Sexual activity: Not Currently  Other Topics Concern   Not on file  Social History Narrative   Lives alone with three dogs. Lost her husband in July, 2023. She has two children. She enjoys reading, playing games and spending time with her dogs (when she can).    Social Determinants of Health   Financial Resource Strain: Low Risk  (10/28/2022)   Overall Financial Resource Strain (CARDIA)    Difficulty of Paying Living Expenses: Not hard at all  Food Insecurity: No Food Insecurity (10/28/2022)   Hunger Vital Sign    Worried About Running Out of Food in the Last Year: Never true    Ran Out of Food in the Last Year: Never true  Transportation Needs: No Transportation Needs (10/28/2022)   PRAPARE - Hydrologist (Medical): No    Lack of Transportation (Non-Medical): No  Physical Activity: Insufficiently Active (10/28/2022)   Exercise Vital  Sign    Days of Exercise per Week: 5 days    Minutes of Exercise per Session: 20 min  Stress: No Stress Concern Present (10/28/2022)   [place]    Feeling of Stress : Not at all  Social Connections: Socially Isolated (10/28/2022)   Social Connection and Isolation Panel [NHANES]    Frequency of Communication with Friends and Family: More than three times a week    Frequency of Social Gatherings with Friends and Family: Twice a week    Attends Religious Services: Never    Marine scientist or Organizations: No    Attends Archivist Meetings: Never  Marital Status: Widowed    Activities of Daily Living    10/28/2022    8:08 AM  In your present state of health, do you have any difficulty performing the following activities:  Hearing? 1  Comment some hearing loss  Vision? 0  Difficulty concentrating or making decisions? 0  Walking or climbing stairs? 1  Comment some difficulty  Dressing or bathing? 0  Doing errands, shopping? 0  Preparing Food and eating ? N  Using the Toilet? N  In the past six months, have you accidently leaked urine? N  Do you have problems with loss of bowel control? N  Managing your Medications? N  Managing your Finances? N  Housekeeping or managing your Housekeeping? N    Patient Education/ Literacy How often do you need to have someone help you when you read instructions, pamphlets, or other written materials from your doctor or pharmacy?: 1 - Never What is the last grade level you completed in school?: Some college  Exercise Current Exercise Habits: Home exercise routine;Structured exercise class, Type of exercise: treadmill;stretching, Time (Minutes): 20, Frequency (Times/Week): 7, Weekly Exercise (Minutes/Week): 140, Intensity: Moderate, Exercise limited by: orthopedic condition(s)  Diet Patient reports consuming 2 meals a day and 4 snack(s) a day Patient reports  that her primary diet is: Regular Patient reports that she does have regular access to food.   Depression Screen    10/28/2022    8:03 AM 04/17/2022    9:20 AM 10/24/2021    8:09 AM 10/09/2021   10:46 AM 04/09/2021   11:20 AM 09/05/2020   10:46 AM 05/05/2020    2:22 PM  PHQ 2/9 Scores  PHQ - 2 Score 0 1 2 0 '4 2 2  '$ PHQ- 9 Score  '2 12 3 14 10 9     '$ Fall Risk    10/28/2022    8:03 AM 09/10/2022   10:54 AM 10/24/2021    8:08 AM 10/09/2021   10:46 AM 09/13/2021    9:59 AM  Fall Risk   Falls in the past year? 0 0 '1 1 1  '$ Number falls in past yr: 0 0 '1 1 1  '$ Comment   6    Injury with Fall? 0 0 1 1 0  Risk for fall due to : No Fall Risks  History of fall(s);Impaired balance/gait;Impaired mobility;Other (Comment) History of fall(s)   Risk for fall due to: Comment   has parkinson's disease.    Follow up Falls evaluation completed;Education provided  Falls evaluation completed;Education provided;Falls prevention discussed Falls evaluation completed      Objective:  Yeraldi Fidler seemed alert and oriented and she participated appropriately during our telephone visit.  Blood Pressure Weight BMI  BP Readings from Last 3 Encounters:  09/10/22 124/72  07/03/22 137/69  04/16/22 126/71   Wt Readings from Last 3 Encounters:  09/10/22 129 lb 9.6 oz (58.8 kg)  07/03/22 131 lb (59.4 kg)  04/16/22 137 lb (62.1 kg)   BMI Readings from Last 1 Encounters:  09/10/22 24.49 kg/m    *Unable to obtain current vital signs, weight, and BMI due to telephone visit type  Hearing/Vision  Taliya did not seem to have difficulty with hearing/understanding during the telephone conversation Reports that she has had a formal eye exam by an eye care professional within the past year Reports that she has had a formal hearing evaluation within the past year *Unable to fully assess hearing and vision during telephone visit type  Cognitive Function:  10/28/2022    8:12 AM 10/24/2021    8:23 AM 06/15/2019    10:12 AM  6CIT Screen  What Year? 0 points 0 points 0 points  What month? 0 points 0 points 0 points  What time? 0 points 0 points 0 points  Count back from 20 0 points 0 points 0 points  Months in reverse 0 points 0 points 0 points  Repeat phrase 0 points 0 points 0 points  Total Score 0 points 0 points 0 points   (Normal:0-7, Significant for Dysfunction: >8)  Normal Cognitive Function Screening: Yes   Immunization & Health Maintenance Record Immunization History  Administered Date(s) Administered   Fluad Quad(high Dose 65+) 09/15/2020, 09/04/2021   Influenza, High Dose Seasonal PF 09/29/2022   Influenza,inj,Quad PF,6+ Mos 08/19/2019   Influenza-Unspecified 09/15/2018   PFIZER(Purple Top)SARS-COV-2 Vaccination 01/09/2020, 01/31/2020, 07/29/2020, 05/02/2021, 09/04/2021   PNEUMOCOCCAL CONJUGATE-20 10/09/2021   Pfizer Covid-19 Vaccine Bivalent Booster 34yr & up 09/15/2022   Pneumococcal Polysaccharide-23 09/25/2018, 06/05/2020   Tdap 10/08/2017    Health Maintenance  Topic Date Due   COVID-19 Vaccine (7 - Pfizer risk series) 11/13/2022 (Originally 11/10/2022)   Zoster Vaccines- Shingrix (1 of 2) 01/28/2023 (Originally 03/20/1960)   MAMMOGRAM  05/17/2023   Medicare Annual Wellness (AWV)  10/29/2023   TETANUS/TDAP  10/09/2027   Pneumonia Vaccine 81 Years old  Completed   INFLUENZA VACCINE  Completed   DEXA SCAN  Completed   HPV VACCINES  Aged Out       Assessment  This is a routine wellness examination for [place]  Health Maintenance: Due or Overdue There are no preventive care reminders to display for this patient.   CMarylen Pontodoes not need a referral for Community Assistance: Care Management:   no Social Work:    no Prescription Assistance:  no Nutrition/Diabetes Education:  no   Plan:  Personalized Goals  Goals Addressed               This Visit's Progress     Patient Stated (pt-stated)        Patient would like to work on her strength in her  legs.        Personalized Health Maintenance & Screening Recommendations  Shingrix  Lung Cancer Screening Recommended: no (Low Dose CT Chest recommended if Age 717-80years, 30 pack-year currently smoking OR have quit w/in past 15 years) Hepatitis C Screening recommended: no HIV Screening recommended: no  Advanced Directives: Written information was not prepared per patient's request.  Referrals & Orders No orders of the defined types were placed in this encounter.   Follow-up Plan Follow-up with BDonella Stade PA-C as planned Patient will schedule Shingrix vaccine at the pharmacy.  Medicare wellness visit in one year.  Patient will access AVS on my chart.   I have personally reviewed and noted the following in the patient's chart:   Medical and social history Use of alcohol, tobacco or illicit drugs  Current medications and supplements Functional ability and status Nutritional status Physical activity Advanced directives List of other physicians Hospitalizations, surgeries, and ER visits in previous 12 months Vitals Screenings to include cognitive, depression, and falls Referrals and appointments  In addition, I have reviewed and discussed with CMarylen Pontocertain preventive protocols, quality metrics, and best practice recommendations. A written personalized care plan for preventive services as well as general preventive health recommendations is available and can be mailed to the patient at her request.      BNovella Rob  [facility], RN BSN  10/28/2022

## 2022-10-30 DIAGNOSIS — N200 Calculus of kidney: Secondary | ICD-10-CM | POA: Diagnosis not present

## 2022-10-30 DIAGNOSIS — R8290 Unspecified abnormal findings in urine: Secondary | ICD-10-CM | POA: Diagnosis not present

## 2022-11-12 ENCOUNTER — Ambulatory Visit: Payer: Medicare Other | Attending: Neurology | Admitting: Physical Therapy

## 2022-11-12 ENCOUNTER — Encounter: Payer: Self-pay | Admitting: Physical Therapy

## 2022-11-12 DIAGNOSIS — M6281 Muscle weakness (generalized): Secondary | ICD-10-CM | POA: Diagnosis not present

## 2022-11-12 DIAGNOSIS — R2689 Other abnormalities of gait and mobility: Secondary | ICD-10-CM

## 2022-11-12 DIAGNOSIS — R2681 Unsteadiness on feet: Secondary | ICD-10-CM | POA: Diagnosis not present

## 2022-11-12 DIAGNOSIS — R29818 Other symptoms and signs involving the nervous system: Secondary | ICD-10-CM | POA: Diagnosis not present

## 2022-11-12 NOTE — Therapy (Addendum)
OUTPATIENT PHYSICAL THERAPY TREATMENT   Patient Name: Chelsey Estes MRN: [id number] DOB:[birth year], 81 y.o., female Today's Date: 11/12/2022   PCP: Iran Planas, [place] REFERRING PROVIDER: Alonza Bogus, DO   PT End of Session - 11/12/22 1618     Visit Number 8    Number of Visits 16    Date for PT Re-Evaluation 11/15/22    Authorization Type medicare and BCBS federal    Progress Note Due on Visit 10    PT Start Time 1618    PT Stop Time 1700    PT Time Calculation (min) 42 min    Activity Tolerance Patient tolerated treatment well    Behavior During Therapy WFL for tasks assessed/performed              Past Medical History:  Diagnosis Date   Anxiety    Arthritis    Depression    Glaucoma    History of kidney stones    7 lithrotripsy   Hyperlipidemia    Hypertension    Parkinson disease    Past Surgical History:  Procedure Laterality Date   ABDOMINAL HYSTERECTOMY     ANTERIOR (CYSTOCELE) AND POSTERIOR REPAIR (RECTOCELE) WITH XENFORM GRAFT AND SACROSPINOUS FIXATION     CATARACT EXTRACTION, BILATERAL     INGUINAL HERNIA REPAIR Left    LUMBAR LAMINECTOMY/DECOMPRESSION MICRODISCECTOMY Right 10/12/2021   Procedure: Right Lumbar four-five Laminectomy for facet/synovial cyst;  Surgeon: Eustace Moore, MD;  Location: [facility];  Service: Neurosurgery;  Laterality: Right;   Patient Active Problem List   Diagnosis Date Noted   Tinnitus of left ear 07/03/2022   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 07/03/2022   Decreased GFR 04/17/2022   Medication management 04/16/2022   Synovial cyst of lumbar facet joint 09/19/2021   DDD (degenerative disc disease), lumbar 04/11/2021   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 03/09/2021   Palpitations 09/05/2020   SOB (shortness of breath) 09/05/2020   Chest pain 09/05/2020   Mixed hyperlipidemia 06/06/2020   Primary insomnia 06/06/2020   Primary parkinsonism 10/26/2019    Osteoporosis 09/15/2019   Blister of great toe of left foot 08/19/2019   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   Moderate episode of recurrent major depressive disorder ([place]) 02/09/2019   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 02/09/2019   Shuffling gait 02/09/2019   Balance problems 02/09/2019   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   Orthostatic hypotension 02/01/2019   Dizziness 02/01/2019   Primary osteoarthritis of right foot 09/25/2018   Snoring 09/25/2018   Night terrors, adult 09/25/2018   Multiple falls 09/25/2018   Metatarsalgia of left foot 08/20/2018   Chronic foot pain, right 08/20/2018   Left hip pain 04/10/2017   History of shingles 04/08/2017   Adenomatous polyp 04/08/2017   Pure hypercholesterolemia 04/08/2017   History of kidney stones 04/08/2017   Essential hypertension 04/08/2017   Depression, recurrent ([place]) 04/08/2017   Anxiety 04/08/2017   Greater trochanteric bursitis of left hip 04/08/2017    ONSET DATE: 09/10/22  REFERRING DIAG: Parkinson's Disease  THERAPY DIAG:  Muscle weakness (generalized)  Other abnormalities of gait and mobility  Unsteadiness on feet  Other symptoms and signs involving the nervous system  Balance disorder  Rationale for Evaluation and Treatment Rehabilitation  SUBJECTIVE:    SUBJECTIVE STATEMENT: Pt reports she had 2 surgeries performed. Last one was on 10/16/22. Just now cleared to return to PT. Pt states she has tried to keep up with some of  her exercises. Pt reports some fatigue today and after all the surgeries  PERTINENT HISTORY: h/o parkinsonism,   PAIN:  Are you having pain? No  PATIENT GOALS I want to be able to walk more normally (not shuffle, not bend forward)  OBJECTIVE:   ___________________________________________________________________________________________________________________________________ From initial eval LOWER EXTREMITY MMT:    MMT Right Eval Left Eval  Hip flexion 3+/5 3+/5  Hip  extension    Hip abduction    Hip adduction    Hip internal rotation    Hip external rotation    Knee flexion 5/5 5/5  Knee extension 5/5 5/5  Ankle dorsiflexion 5/5 5/5  (Blank rows = not tested)  FUNCTIONAL TESTs: From eval  5 times sit to stand: 17 seconds Timed up and go (TUG): 11 10 meter walk test: 2.86 ft/sec  Mini Best 14 seconds  10/27 5 times sit to stand: 13 sec TUG: 11 sec  11/28 Mini-BESTest 19   [facility] PT Assessment - 11/12/22 0001       Mini-BESTest   Sit To Stand Normal: Comes to stand without use of hands and stabilizes independently.    Rise to Toes Normal: Stable for 3 s with maximum height.    Stand on one leg (left) Normal: 20 s.    Stand on one leg (right) Moderate: < 20 s   8 sec on R,   Stand on one leg - lowest score 1    Compensatory Stepping Correction - Forward Moderate: More than one step is required to recover equilibrium    Compensatory Stepping Correction - Backward Moderate: More than one step is required to recover equilibrium    Compensatory Stepping Correction - Left Lateral Moderate: Several steps to recover equilibrium    Compensatory Stepping Correction - Right Lateral Moderate: Several steps to recover equilibrium    Stepping Corredtion Lateral - lowest score 1    Stance - Feet together, eyes open, firm surface  Normal: 30s    Stance - Feet together, eyes closed, foam surface  Moderate: < 30s    Incline - Eyes Closed Normal: Stands independently 30s and aligns with gravity    Change in Gait Speed Normal: Significantly changes walkling speed without imbalance    Walk with head turns - Horizontal Moderate: performs head turns with reduction in gait speed.    Walk with pivot turns Moderate:Turns with feet close SLOW (>4 steps) with good balance.    Step over obstacles Moderate: Steps over box but touches box OR displays cautious behavior by slowing gait.    Timed UP & GO with Dual Task Moderate: Dual Task affects either counting OR  walking (>10%) when compared to the TUG without Dual Task.    Mini-BEST total score 19      Timed Up and Go Test   Normal TUG (seconds) 11    Cognitive TUG (seconds) 13.87                __________________________________________________________________________________________________   TODAY'S TREATMENT:  11/12/22 THEREX Treadmill 1.0 mph x 5 min   SELF CARE Discussed PWP and appropriate classes/resources in the area and online   10/11/22: THEREX Nustep L5 x 5 min UEs/LEs Standing Gastroc stretch 2x30 sec R&L Standing Hamstring stretch x30 sec R&L  NMR On airex Feet apart and then feet together EO x30 sec each Step back/forward over dowel x10 R & L LE   10/02/22: THEREX T position with lateral clapping R and L x 10 reps Alternating rock and reach  with LEs only working on hip position and weight shifting x 10 reps  NMR Wall bumps on solid surfaces x 10 reps Compliant surface standing with wall bumps x 10 reps Posterior stepping off edge of compliant foam Rocker board ant/posterior with proactive and reactive balance   Large amplitude marching with alternating UEs     09/30/22: THEREX Treadmill x at 3% incline up to 1.4 mph with intermittent UE Support Sit to stand with UE reach and posture up position x 10 reps Standing trunk twist + weight shifting with T to clap R and T to clap L x 10 reps near support if needed Lateral weight shifting x 10 reps R and L with demo cues Seated trunk extension x 10 reps focusing on upright posture Crouch to tall kneel x 10 reps Crouch to 1/2 kneel-- unable to do with tactile and demo cues Supine rotation T to sidelying x 5 reps each Supine large amplitude marching x 10 reps R and L sides Bridges x 10 reps with cues Alternating step reach anteriorly and bow and lean posteriorly with SBA for safety   HOME EXERCISE PROGRAM: Access Code: [email or web address] URL: [email or web address] Date: 09/30/2022 Prepared  by: Rudell Cobb  Exercises - Sit to Stand with Arm Swing  - 1-2 x daily - 7 x weekly - 2 sets - 10 reps - Side to Side Weight Shift with Overhead Reach and Counter Support  - 1-2 x daily - 7 x weekly - 2 sets - 10 reps - Staggered Stance Weight Shift with Arms Reaching  - 1-2 x daily - 7 x weekly - 2 sets - 10 reps - Alternating Step Backward with Support  - 1 x daily - 7 x weekly - 2 sets - 10 reps - Alternating Side Step  - 1-2 x daily - 7 x weekly - 2 sets - 10 reps  GOALS: Goals reviewed with patient? Yes  SHORT TERM GOALS: Target date: 10/14/2022  The patient will be indep with initial HEP for large amplitude movements. Baseline:No HEP Goal status: ACHIEVED  2.  The patient will reduce 5 time sit<>stand to < or equal to 14 seconds. Baseline: 17 seconds. Goal status: ACHIEVED, 13 sec 10/11/22  3.  The patient will improve miniBEST test to > or equal to 18/28. Baseline: 16/28 ON 10/02/22 Goal status: ACHIEVED, 19/28 on 10/11/22  4.  The patient will improve TUG cognitive to < or equal to 15 seconds. Baseline: 19 seconds Goal status: ACHIEVED, 11 sec on 10/11/22  5.  The patient will move floor<>stand with UE support mod indep. Baseline: reports independence Goal status: ACHIEVED  LONG TERM GOALS: Revised Target date: 12/24/2022   The patient will be indep with HEP progression. Baseline: no HEP Goal status:IN PROGRESS  2.  The patient will improve mini Best test to > or equal to 20/28  Baseline: 19/28 on 11/28 Goal status:IN PROGRESS  3.  The patient will verbalize understanding of community resources for PD including POP and PWR community classes. Baseline: No participation in community programs -- provided info on 11/28 Goal status: IN PROGRESS  4.  The patient will verbalize 3 tips to reduce freezing during gait. Baseline: Reports festination during gait. Goal status:IN PROGRESS  ASSESSMENT:  CLINICAL IMPRESSION: Pt returns to PT s/p surgeries. Session  limited due to pt fatigue. Focused on discussing POP information and PWR classes online or in person at [facility]. Rechecked pt's Mini Bestest -- remains similar to her prior level. Pt ready to return  to PT to continue to work on her mobility, strength, balance, and endurance. Pt would benefit from continued PT to continue to progress towards her LTGs.   PLAN: PT FREQUENCY: 2x/week  PT DURATION: 8 weeks  PLANNED INTERVENTIONS: Therapeutic exercises, Therapeutic activity, Neuromuscular re-education, Balance training, Gait training, Patient/Family education, Self Care, Joint mobilization, and Joint manipulation  PLAN FOR NEXT SESSION:  Large amplitude movements, dynamic gait training, high level balance, postural control  Renso Swett April Ma L Ceria Suminski, PT, DPT 11/12/2022, 4:18 PM

## 2022-11-13 ENCOUNTER — Ambulatory Visit: Payer: Medicare Other | Admitting: Physician Assistant

## 2022-11-13 DIAGNOSIS — K5900 Constipation, unspecified: Secondary | ICD-10-CM | POA: Diagnosis not present

## 2022-11-13 DIAGNOSIS — N200 Calculus of kidney: Secondary | ICD-10-CM | POA: Diagnosis not present

## 2022-11-19 ENCOUNTER — Encounter: Payer: Self-pay | Admitting: Physical Therapy

## 2022-11-19 ENCOUNTER — Ambulatory Visit: Payer: Medicare Other | Attending: Neurology | Admitting: Physical Therapy

## 2022-11-19 DIAGNOSIS — R2689 Other abnormalities of gait and mobility: Secondary | ICD-10-CM | POA: Diagnosis not present

## 2022-11-19 DIAGNOSIS — M6281 Muscle weakness (generalized): Secondary | ICD-10-CM | POA: Insufficient documentation

## 2022-11-19 DIAGNOSIS — H903 Sensorineural hearing loss, bilateral: Secondary | ICD-10-CM | POA: Insufficient documentation

## 2022-11-19 DIAGNOSIS — R29818 Other symptoms and signs involving the nervous system: Secondary | ICD-10-CM | POA: Diagnosis not present

## 2022-11-19 DIAGNOSIS — R2681 Unsteadiness on feet: Secondary | ICD-10-CM | POA: Diagnosis not present

## 2022-11-19 NOTE — Therapy (Signed)
OUTPATIENT PHYSICAL THERAPY TREATMENT   Patient Name: Chelsey Estes MRN: [id number] DOB:[birth year], 81 y.o., female Today's Date: 11/19/2022   PCP: Iran Planas, [place] REFERRING PROVIDER: Alonza Bogus, DO   PT End of Session - 11/19/22 0947     Visit Number 9    Number of Visits 16    Date for PT Re-Evaluation 12/24/22    Authorization Type medicare and BCBS federal    Progress Note Due on Visit 10    PT Start Time 0930    PT Stop Time 1015    PT Time Calculation (min) 45 min    Activity Tolerance Patient tolerated treatment well    Behavior During Therapy WFL for tasks assessed/performed               Past Medical History:  Diagnosis Date   Anxiety    Arthritis    Depression    Glaucoma    History of kidney stones    7 lithrotripsy   Hyperlipidemia    Hypertension    Parkinson disease    Past Surgical History:  Procedure Laterality Date   ABDOMINAL HYSTERECTOMY     ANTERIOR (CYSTOCELE) AND POSTERIOR REPAIR (RECTOCELE) WITH XENFORM GRAFT AND SACROSPINOUS FIXATION     CATARACT EXTRACTION, BILATERAL     INGUINAL HERNIA REPAIR Left    LUMBAR LAMINECTOMY/DECOMPRESSION MICRODISCECTOMY Right 10/12/2021   Procedure: Right Lumbar four-five Laminectomy for facet/synovial cyst;  Surgeon: Eustace Moore, MD;  Location: [facility];  Service: Neurosurgery;  Laterality: Right;   Patient Active Problem List   Diagnosis Date Noted   Tinnitus of left ear 07/03/2022   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 07/03/2022   Decreased GFR 04/17/2022   Medication management 04/16/2022   Synovial cyst of lumbar facet joint 09/19/2021   DDD (degenerative disc disease), lumbar 04/11/2021   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 03/09/2021   Palpitations 09/05/2020   SOB (shortness of breath) 09/05/2020   Chest pain 09/05/2020   Mixed hyperlipidemia 06/06/2020   Primary insomnia 06/06/2020   Primary parkinsonism 10/26/2019    Osteoporosis 09/15/2019   Blister of great toe of left foot 08/19/2019   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   Moderate episode of recurrent major depressive disorder ([place]) 02/09/2019   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 02/09/2019   Shuffling gait 02/09/2019   Balance problems 02/09/2019   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   Orthostatic hypotension 02/01/2019   Dizziness 02/01/2019   Primary osteoarthritis of right foot 09/25/2018   Snoring 09/25/2018   Night terrors, adult 09/25/2018   Multiple falls 09/25/2018   Metatarsalgia of left foot 08/20/2018   Chronic foot pain, right 08/20/2018   Left hip pain 04/10/2017   History of shingles 04/08/2017   Adenomatous polyp 04/08/2017   Pure hypercholesterolemia 04/08/2017   History of kidney stones 04/08/2017   Essential hypertension 04/08/2017   Depression, recurrent ([place]) 04/08/2017   Anxiety 04/08/2017   Greater trochanteric bursitis of left hip 04/08/2017    ONSET DATE: 09/10/22  REFERRING DIAG: Parkinson's Disease  THERAPY DIAG:  Muscle weakness (generalized)  Other abnormalities of gait and mobility  Unsteadiness on feet  Other symptoms and signs involving the nervous system  Balance disorder  Rationale for Evaluation and Treatment Rehabilitation  SUBJECTIVE:    SUBJECTIVE STATEMENT: Pt reports she is feeling more energetic today.   PERTINENT HISTORY: h/o parkinsonism,   PAIN:  Are you having pain? No  PATIENT GOALS I want to  be able to walk more normally (not shuffle, not bend forward)  OBJECTIVE:   ___________________________________________________________________________________________________________________________________ From initial eval unless otherwise stated LOWER EXTREMITY MMT:    MMT Right Eval Left Eval  Hip flexion 3+/5 3+/5  Hip extension    Hip abduction    Hip adduction    Hip internal rotation    Hip external rotation    Knee flexion 5/5 5/5  Knee extension 5/5 5/5   Ankle dorsiflexion 5/5 5/5  (Blank rows = not tested)  FUNCTIONAL TESTs: From eval  5 times sit to stand: 17 seconds Timed up and go (TUG): 11 10 meter walk test: 2.86 ft/sec  Mini Best 14 seconds  10/27 5 times sit to stand: 13 sec TUG: 11 sec  11/28 Mini-BESTest 19   __________________________________________________________________________________________________   TODAY'S TREATMENT:  11/19/22 THEREX Nustep L6 x 5 min UEs/LEs March forward with alternating arms 1 lap Backwards stepping with alternating arm flexion 1 lap Side step L and then R x 1 lap each with bilat arm flexion Forward lunge with open arms 2x20'  NMED Standing on airex eyes open  Marching 2x10  Feet together head turns x30 sec  Feet together head nods x 30 sec  Side step off 2x10  Backwards step off 2x10 Standing on airex eyes closed feet together 2x30 sec   11/12/22 THEREX Treadmill 1.0 mph x 5 min   SELF CARE Discussed PWP and appropriate classes/resources in the area and online   10/11/22: THEREX Nustep L5 x 5 min UEs/LEs Standing Gastroc stretch 2x30 sec R&L Standing Hamstring stretch x30 sec R&L  NMR On airex Feet apart and then feet together EO x30 sec each Step back/forward over dowel x10 R & L LE   HOME EXERCISE PROGRAM: Access Code: [email or web address] URL: [email or web address] Date: 09/30/2022 Prepared by: Rudell Cobb  Exercises - Sit to Stand with Arm Swing  - 1-2 x daily - 7 x weekly - 2 sets - 10 reps - Side to Side Weight Shift with Overhead Reach and Counter Support  - 1-2 x daily - 7 x weekly - 2 sets - 10 reps - Staggered Stance Weight Shift with Arms Reaching  - 1-2 x daily - 7 x weekly - 2 sets - 10 reps - Alternating Step Backward with Support  - 1 x daily - 7 x weekly - 2 sets - 10 reps - Alternating Side Step  - 1-2 x daily - 7 x weekly - 2 sets - 10 reps  GOALS: Goals reviewed with patient? Yes  SHORT TERM GOALS: Target date:  10/14/2022  The patient will be indep with initial HEP for large amplitude movements. Baseline:No HEP Goal status: ACHIEVED  2.  The patient will reduce 5 time sit<>stand to < or equal to 14 seconds. Baseline: 17 seconds. Goal status: ACHIEVED, 13 sec 10/11/22  3.  The patient will improve miniBEST test to > or equal to 18/28. Baseline: 16/28 ON 10/02/22 Goal status: ACHIEVED, 19/28 on 10/11/22  4.  The patient will improve TUG cognitive to < or equal to 15 seconds. Baseline: 19 seconds Goal status: ACHIEVED, 11 sec on 10/11/22  5.  The patient will move floor<>stand with UE support mod indep. Baseline: reports independence Goal status: ACHIEVED  LONG TERM GOALS: Revised Target date: 12/24/2022   The patient will be indep with HEP progression. Baseline: no HEP Goal status:IN PROGRESS  2.  The patient will improve mini Best test to > or equal to 20/28  Baseline: 19/28 on  11/28 Goal status:IN PROGRESS  3.  The patient will verbalize understanding of community resources for PD including POP and PWR community classes. Baseline: No participation in community programs -- provided info on 11/28 Goal status: IN PROGRESS  4.  The patient will verbalize 3 tips to reduce freezing during gait. Baseline: Reports festination during gait. Goal status:IN PROGRESS  ASSESSMENT:  CLINICAL IMPRESSION: Treatment focused on continuing to progress dynamic balance, strengthening and large amplitude movements.   PLAN: PT FREQUENCY: 2x/week  PT DURATION: 8 weeks  PLANNED INTERVENTIONS: Therapeutic exercises, Therapeutic activity, Neuromuscular re-education, Balance training, Gait training, Patient/Family education, Self Care, Joint mobilization, and Joint manipulation  PLAN FOR NEXT SESSION:  Large amplitude movements, dynamic gait training, high level balance, postural control  Tremar Wickens April Ma L Arin Vanosdol, PT, DPT 11/19/2022, 9:53 AM

## 2022-11-22 ENCOUNTER — Encounter: Payer: Self-pay | Admitting: Physical Therapy

## 2022-11-22 ENCOUNTER — Ambulatory Visit: Payer: Medicare Other | Admitting: Physical Therapy

## 2022-11-22 DIAGNOSIS — R2689 Other abnormalities of gait and mobility: Secondary | ICD-10-CM

## 2022-11-22 DIAGNOSIS — M6281 Muscle weakness (generalized): Secondary | ICD-10-CM | POA: Diagnosis not present

## 2022-11-22 DIAGNOSIS — R29818 Other symptoms and signs involving the nervous system: Secondary | ICD-10-CM

## 2022-11-22 DIAGNOSIS — R2681 Unsteadiness on feet: Secondary | ICD-10-CM

## 2022-11-22 DIAGNOSIS — H903 Sensorineural hearing loss, bilateral: Secondary | ICD-10-CM | POA: Diagnosis not present

## 2022-11-22 DIAGNOSIS — N200 Calculus of kidney: Secondary | ICD-10-CM | POA: Insufficient documentation

## 2022-11-22 NOTE — Therapy (Addendum)
OUTPATIENT PHYSICAL THERAPY TREATMENT   Patient Name: Chelsey Estes MRN: [id number] DOB:1941-04-18, 81 y.o., female Today's Date: 11/22/2022  Progress Note Reporting Period 09/16/22 to 11/22/22  See note below for Objective Data and Assessment of Progress/Goals.     PCP: Iran Planas, [place] REFERRING PROVIDER: Alonza Bogus, DO   PT End of Session - 11/22/22 1018     Visit Number 10    Number of Visits 16    Date for PT Re-Evaluation 12/24/22    Authorization Type medicare and BCBS federal    Progress Note Due on Visit 10    PT Start Time 1018    PT Stop Time 1100    PT Time Calculation (min) 42 min    Activity Tolerance Patient tolerated treatment well    Behavior During Therapy WFL for tasks assessed/performed               Past Medical History:  Diagnosis Date   Anxiety    Arthritis    Depression    Glaucoma    History of kidney stones    7 lithrotripsy   Hyperlipidemia    Hypertension    Parkinson disease    Past Surgical History:  Procedure Laterality Date   ABDOMINAL HYSTERECTOMY     ANTERIOR (CYSTOCELE) AND POSTERIOR REPAIR (RECTOCELE) WITH XENFORM GRAFT AND SACROSPINOUS FIXATION     CATARACT EXTRACTION, BILATERAL     INGUINAL HERNIA REPAIR Left    LUMBAR LAMINECTOMY/DECOMPRESSION MICRODISCECTOMY Right 10/12/2021   Procedure: Right Lumbar four-five Laminectomy for facet/synovial cyst;  Surgeon: Eustace Moore, MD;  Location: [facility];  Service: Neurosurgery;  Laterality: Right;   Patient Active Problem List   Diagnosis Date Noted   Kidney stone 11/22/2022   Tinnitus of left ear 07/03/2022   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 07/03/2022   Decreased GFR 04/17/2022   Medication management 04/16/2022   Synovial cyst of lumbar facet joint 09/19/2021   DDD (degenerative disc disease), lumbar 04/11/2021   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 03/09/2021   Palpitations 09/05/2020   SOB  (shortness of breath) 09/05/2020   Chest pain 09/05/2020   Mixed hyperlipidemia 06/06/2020   Primary insomnia 06/06/2020   Primary parkinsonism 10/26/2019   Osteoporosis 09/15/2019   Blister of great toe of left foot 08/19/2019   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   Moderate episode of recurrent major depressive disorder ([place]) 02/09/2019   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 02/09/2019   Shuffling gait 02/09/2019   Balance problems 02/09/2019   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   Orthostatic hypotension 02/01/2019   Dizziness 02/01/2019   Primary osteoarthritis of right foot 09/25/2018   Snoring 09/25/2018   Night terrors, adult 09/25/2018   Multiple falls 09/25/2018   Metatarsalgia of left foot 08/20/2018   Chronic foot pain, right 08/20/2018   Left hip pain 04/10/2017   History of shingles 04/08/2017   Adenomatous polyp 04/08/2017   Pure hypercholesterolemia 04/08/2017   History of kidney stones 04/08/2017   Essential hypertension 04/08/2017   Depression, recurrent ([place]) 04/08/2017   Anxiety 04/08/2017   Greater trochanteric bursitis of left hip 04/08/2017    ONSET DATE: 09/10/22  REFERRING DIAG: Parkinson's Disease  THERAPY DIAG:  Muscle weakness (generalized)  Other abnormalities of gait and mobility  Unsteadiness on feet  Other symptoms and signs involving the nervous system  Balance disorder  Rationale for Evaluation and Treatment Rehabilitation  SUBJECTIVE:    SUBJECTIVE STATEMENT: Nothing new or  different.   PERTINENT HISTORY: h/o parkinsonism,   PAIN:  Are you having pain? No  PATIENT GOALS I want to be able to walk more normally (not shuffle, not bend forward)  OBJECTIVE:   ___________________________________________________________________________________________________________________________________ From initial eval unless otherwise stated LOWER EXTREMITY MMT:    MMT Right Eval Left Eval  Hip flexion 3+/5 3+/5  Hip extension     Hip abduction    Hip adduction    Hip internal rotation    Hip external rotation    Knee flexion 5/5 5/5  Knee extension 5/5 5/5  Ankle dorsiflexion 5/5 5/5  (Blank rows = not tested)  FUNCTIONAL TESTs: From eval  5 times sit to stand: 17 seconds Timed up and go (TUG): 11 10 meter walk test: 2.86 ft/sec  Mini Best 14 seconds  10/27 5 times sit to stand: 13 sec TUG: 11 sec  11/28 Mini-BESTest 19   __________________________________________________________________________________________________   TODAY'S TREATMENT:  11/22/22 THEREX Nustep L6 x 5 min UEs/LEs Hamstring stretch x 20 sec R&L March forward with alternating arms 1 lap Backwards stepping 1 lap Side step L&R x 1 lap Forward lunge with open arms 2x20'  NMED Standing on airex eyes closed  With perturbations multidirectional x 20 sec   Static 2x30 sec  Feet apart head turns x 30 sec, x30 sec with cognitive task  Feet apart head nods x 30 sec, x30 sec with cognitive task Resisted walking 5# pulleys x 5 each direction   11/19/22 THEREX Nustep L6 x 5 min UEs/LEs March forward with alternating arms 1 lap Backwards stepping with alternating arm flexion 1 lap Side step L and then R x 1 lap each with bilat arm flexion Forward lunge with open arms 2x20'  NMED Standing on airex eyes open  Marching 2x10  Feet together head turns x30 sec  Feet together head nods x 30 sec  Side step off 2x10  Backwards step off 2x10 Standing on airex eyes closed feet together 2x30 sec   HOME EXERCISE PROGRAM: Access Code: [email or web address] URL: [email or web address] Date: 09/30/2022 Prepared by: Rudell Cobb  Exercises - Sit to Stand with Arm Swing  - 1-2 x daily - 7 x weekly - 2 sets - 10 reps - Side to Side Weight Shift with Overhead Reach and Counter Support  - 1-2 x daily - 7 x weekly - 2 sets - 10 reps - Staggered Stance Weight Shift with Arms Reaching  - 1-2 x daily - 7 x weekly - 2 sets - 10 reps -  Alternating Step Backward with Support  - 1 x daily - 7 x weekly - 2 sets - 10 reps - Alternating Side Step  - 1-2 x daily - 7 x weekly - 2 sets - 10 reps  GOALS: Goals reviewed with patient? Yes  SHORT TERM GOALS: Target date: 10/14/2022  The patient will be indep with initial HEP for large amplitude movements. Baseline:No HEP Goal status: ACHIEVED  2.  The patient will reduce 5 time sit<>stand to < or equal to 14 seconds. Baseline: 17 seconds. Goal status: ACHIEVED, 13 sec 10/11/22  3.  The patient will improve miniBEST test to > or equal to 18/28. Baseline: 16/28 ON 10/02/22 Goal status: ACHIEVED, 19/28 on 10/11/22  4.  The patient will improve TUG cognitive to < or equal to 15 seconds. Baseline: 19 seconds Goal status: ACHIEVED, 11 sec on 10/11/22  5.  The patient will move floor<>stand with UE support mod indep. Baseline: reports independence Goal status:  ACHIEVED  LONG TERM GOALS: Revised Target date: 12/24/2022   The patient will be indep with HEP progression. Baseline: no HEP Goal status:IN PROGRESS  2.  The patient will improve mini Best test to > or equal to 20/28  Baseline: 19/28 on 11/28 Goal status:IN PROGRESS  3.  The patient will verbalize understanding of community resources for PD including POP and PWR community classes. Baseline: No participation in community programs -- provided info on 11/28 Goal status: IN PROGRESS  4.  The patient will verbalize 3 tips to reduce freezing during gait. Baseline: Reports festination during gait. Goal status:IN PROGRESS  ASSESSMENT:  CLINICAL IMPRESSION: Initiated resisted walking and balance activities with cognitive tasks. Continued to work on large amplitude movement and LE strengthening. Educated pt on tips to decrease freezing. She has been able to demonstrate improving balance on foam and increasing strength. Pt would benefit from continued PT to work on festinating gait and dynamic balance.   PLAN: PT  FREQUENCY: 2x/week  PT DURATION: 8 weeks  PLANNED INTERVENTIONS: Therapeutic exercises, Therapeutic activity, Neuromuscular re-education, Balance training, Gait training, Patient/Family education, Self Care, Joint mobilization, and Joint manipulation  PLAN FOR NEXT SESSION:  Large amplitude movements, dynamic gait training, high level balance, postural control  Aleesia Henney April Ma L Sharlotte Baka, PT, DPT 11/22/2022, 10:19 AM

## 2022-11-25 ENCOUNTER — Encounter: Payer: Self-pay | Admitting: Physician Assistant

## 2022-11-25 ENCOUNTER — Ambulatory Visit (INDEPENDENT_AMBULATORY_CARE_PROVIDER_SITE_OTHER): Payer: Medicare Other | Admitting: Physician Assistant

## 2022-11-25 VITALS — BP 148/88 | HR 67 | Ht 61.0 in | Wt 130.0 lb

## 2022-11-25 DIAGNOSIS — F5101 Primary insomnia: Secondary | ICD-10-CM | POA: Diagnosis not present

## 2022-11-25 DIAGNOSIS — Z87442 Personal history of urinary calculi: Secondary | ICD-10-CM

## 2022-11-25 DIAGNOSIS — I10 Essential (primary) hypertension: Secondary | ICD-10-CM

## 2022-11-25 DIAGNOSIS — E559 Vitamin D deficiency, unspecified: Secondary | ICD-10-CM

## 2022-11-25 DIAGNOSIS — E782 Mixed hyperlipidemia: Secondary | ICD-10-CM | POA: Diagnosis not present

## 2022-11-25 DIAGNOSIS — H9122 Sudden idiopathic hearing loss, left ear: Secondary | ICD-10-CM | POA: Diagnosis not present

## 2022-11-25 DIAGNOSIS — G20C Parkinsonism, unspecified: Secondary | ICD-10-CM

## 2022-11-25 DIAGNOSIS — H9312 Tinnitus, left ear: Secondary | ICD-10-CM

## 2022-11-25 MED ORDER — LISINOPRIL 5 MG PO TABS
ORAL_TABLET | ORAL | 0 refills | Status: DC
Start: 2022-11-25 — End: 2023-02-24

## 2022-11-25 NOTE — Progress Notes (Signed)
Established Patient Office Visit  Subjective   Patient ID: Chelsey Estes, female    DOB: [birth year]  Age: 81 y.o. MRN: [id number]  Chief Complaint  Patient presents with   Follow-up    HPI Pt is a 81 yo female with Parkinson's disease, insomnia, tinnitus of left ear with hearing loss, recent kidney stones, hypertension who presents to the clinic for follow-up.  Patient does check her blood pressures at home and she states they are more around 130s over 80s.  She denies any chest pains, palpitations, headaches or vision changes.  She is taking her lisinopril 5 mg twice a day.  She just had nephrolithotomy in early November.  It was more intensive than she thought but she has recovered.  She is following up with urology.  She stopped Belsomra because she felt like it was not working anymore.  She was only at the 10 mg dose.  She questions what to do to help with sleep.  Patient did see ENT and hearing tests were done but was never followed up on.  She wonders what her next steps are.   .. Active Ambulatory Problems    Diagnosis Date Noted   History of shingles 04/08/2017   Adenomatous polyp 04/08/2017   Pure hypercholesterolemia 04/08/2017   History of kidney stones 04/08/2017   Essential hypertension 04/08/2017   Depression, recurrent ([place]) 04/08/2017   Anxiety 04/08/2017   Greater trochanteric bursitis of left hip 04/08/2017   Left hip pain 04/10/2017   Metatarsalgia of left foot 08/20/2018   Chronic foot pain, right 08/20/2018   Primary osteoarthritis of right foot 09/25/2018   Snoring 09/25/2018   Night terrors, adult 09/25/2018   Multiple falls 09/25/2018   Orthostatic hypotension 02/01/2019   Dizziness 02/01/2019   Moderate episode of recurrent major depressive disorder ([place]) 02/09/2019   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 02/09/2019   Shuffling gait 02/09/2019   Balance problems 02/09/2019   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   Pituitary macroadenoma ([place])  03/15/2019   Blister of great toe of left foot 08/19/2019   Osteoporosis 09/15/2019   Primary parkinsonism 10/26/2019   Mixed hyperlipidemia 06/06/2020   Primary insomnia 06/06/2020   Palpitations 09/05/2020   SOB (shortness of breath) 09/05/2020   Chest pain 09/05/2020   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 03/09/2021   DDD (degenerative disc disease), lumbar 04/11/2021   Synovial cyst of lumbar facet joint 09/19/2021   Medication management 04/16/2022   Decreased GFR 04/17/2022   Tinnitus of left ear 07/03/2022   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    Objective:     BP (!) 148/88   Pulse 67   Ht '5\' 1"'$  (1.549 m)   Wt 130 lb (59 kg)   SpO2 100%   BMI 24.56 kg/m  BP Readings from Last 3 Encounters:  11/25/22 (!) 148/88  09/10/22 124/72  07/03/22 137/69   Wt Readings from Last 3 Encounters:  11/25/22 130 lb (59 kg)  09/10/22 129 lb 9.6 oz (58.8 kg)  07/03/22 131 lb (59.4 kg)    .Marland Kitchen  11/25/2022    2:55 PM 10/28/2022    8:03 AM 04/17/2022    9:20 AM 10/24/2021    8:09 AM 10/09/2021   10:46 AM  Depression screen PHQ 2/9  Decreased Interest 1 0 0 1 0  Down, Depressed, Hopeless 1 0 1 1 0  PHQ - 2 Score 2 0 1 2 0  Altered sleeping 2  0 3 0  Tired, decreased energy '1  1 1 3  '$ Change in appetite 1  0 1 0  Feeling bad or failure about yourself  0  0 1 0  Trouble concentrating 0  0 1 0  Moving slowly or fidgety/restless 1  0 3 0  Suicidal thoughts 0  0 0 0  PHQ-9 Score '7  2 12 3  '$ Difficult doing work/chores Somewhat difficult  Not difficult at all Somewhat difficult Not difficult at  all   ..    11/25/2022    2:55 PM 04/17/2022    9:20 AM 04/09/2021   11:21 AM 09/05/2020   10:47 AM  GAD 7 : Generalized Anxiety Score  Nervous, Anxious, on Edge '1 1 3 3  '$ Control/stop worrying 1 0 3 3  Worry too much - different things '1 1 3 3  '$ Trouble relaxing 1 0 3 3  Restless 0 0 3 3  Easily annoyed or irritable 0 0 1 3  Afraid - awful might happen 0 0 1 1  Total GAD 7 Score '4 2 17 19  '$ Anxiety Difficulty Somewhat difficult Not difficult at all Very difficult Somewhat difficult      Physical Exam Constitutional:      Appearance: Normal appearance.  HENT:     Head: Normocephalic.  Cardiovascular:     Rate and Rhythm: Normal rate and regular rhythm.  Pulmonary:     Effort: Pulmonary effort is normal.     Breath sounds: Normal breath sounds.  Musculoskeletal:     Right lower leg: No edema.     Left lower leg: No edema.  Neurological:     General: No focal deficit present.     Mental Status: She is alert and oriented to person, place, and time.     Comments: Uncontrollable jerks and involuntary movements present          Assessment & Plan:  Marland KitchenMarland KitchenShantina was seen today for follow-up.  Diagnoses and all orders for this visit:  Essential hypertension -     COMPLETE METABOLIC PANEL WITH GFR -     lisinopril (ZESTRIL) 5 MG tablet; Take 2 tablets in the morning and 1 tablet in the evening.  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  Needs labs.   Urology- kidney stones     Iran Planas, PA-C

## 2022-11-25 NOTE — Patient Instructions (Addendum)
Increase belsomnra to '20mg'$  at bedtime for 2 weeks.  Will check audiology for hearing test.  Increase lisinopril to '10mg'$  in am and '5mg'$  in pm

## 2022-11-26 ENCOUNTER — Encounter: Payer: Self-pay | Admitting: Physical Therapy

## 2022-11-26 ENCOUNTER — Ambulatory Visit: Payer: Medicare Other | Admitting: Physical Therapy

## 2022-11-26 DIAGNOSIS — R29818 Other symptoms and signs involving the nervous system: Secondary | ICD-10-CM

## 2022-11-26 DIAGNOSIS — R2689 Other abnormalities of gait and mobility: Secondary | ICD-10-CM

## 2022-11-26 DIAGNOSIS — M6281 Muscle weakness (generalized): Secondary | ICD-10-CM | POA: Diagnosis not present

## 2022-11-26 DIAGNOSIS — H903 Sensorineural hearing loss, bilateral: Secondary | ICD-10-CM | POA: Diagnosis not present

## 2022-11-26 DIAGNOSIS — R2681 Unsteadiness on feet: Secondary | ICD-10-CM | POA: Diagnosis not present

## 2022-11-26 NOTE — Therapy (Signed)
OUTPATIENT PHYSICAL THERAPY TREATMENT   Patient Name: Chelsey Estes MRN: [id number] DOB:1941/03/16, 81 y.o., female Today's Date: 11/26/2022   PCP: Iran Planas, [place] REFERRING PROVIDER: Alonza Bogus, DO   PT End of Session - 11/26/22 1102     Visit Number 11    Number of Visits 16    Date for PT Re-Evaluation 12/24/22    Authorization Type medicare and BCBS federal    Progress Note Due on Visit 10    PT Start Time 1102    PT Stop Time 1145    PT Time Calculation (min) 43 min    Activity Tolerance Patient tolerated treatment well    Behavior During Therapy WFL for tasks assessed/performed               Past Medical History:  Diagnosis Date   Anxiety    Arthritis    Depression    Glaucoma    History of kidney stones    7 lithrotripsy   Hyperlipidemia    Hypertension    Parkinson disease    Past Surgical History:  Procedure Laterality Date   ABDOMINAL HYSTERECTOMY     ANTERIOR (CYSTOCELE) AND POSTERIOR REPAIR (RECTOCELE) WITH XENFORM GRAFT AND SACROSPINOUS FIXATION     CATARACT EXTRACTION, BILATERAL     INGUINAL HERNIA REPAIR Left    LUMBAR LAMINECTOMY/DECOMPRESSION MICRODISCECTOMY Right 10/12/2021   Procedure: Right Lumbar four-five Laminectomy for facet/synovial cyst;  Surgeon: Eustace Moore, MD;  Location: [place];  Service: Neurosurgery;  Laterality: Right;   Patient Active Problem List   Diagnosis Date Noted   Kidney stone 11/22/2022   Tinnitus of left ear 07/03/2022   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 07/03/2022   Decreased GFR 04/17/2022   Medication management 04/16/2022   Synovial cyst of lumbar facet joint 09/19/2021   DDD (degenerative disc disease), lumbar 04/11/2021   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 03/09/2021   Palpitations 09/05/2020   SOB (shortness of breath) 09/05/2020   Chest pain 09/05/2020   Mixed hyperlipidemia 06/06/2020   Primary insomnia 06/06/2020    Primary parkinsonism 10/26/2019   Osteoporosis 09/15/2019   Blister of great toe of left foot 08/19/2019   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   Moderate episode of recurrent major depressive disorder ([place]) 02/09/2019   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 02/09/2019   Shuffling gait 02/09/2019   Balance problems 02/09/2019   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   Orthostatic hypotension 02/01/2019   Dizziness 02/01/2019   Primary osteoarthritis of right foot 09/25/2018   Snoring 09/25/2018   Night terrors, adult 09/25/2018   Multiple falls 09/25/2018   Metatarsalgia of left foot 08/20/2018   Chronic foot pain, right 08/20/2018   Left hip pain 04/10/2017   History of shingles 04/08/2017   Adenomatous polyp 04/08/2017   Pure hypercholesterolemia 04/08/2017   History of kidney stones 04/08/2017   Essential hypertension 04/08/2017   Depression, recurrent ([place]) 04/08/2017   Anxiety 04/08/2017   Greater trochanteric bursitis of left hip 04/08/2017    ONSET DATE: 09/10/22  REFERRING DIAG: Parkinson's Disease  THERAPY DIAG:  Muscle weakness (generalized)  Other abnormalities of gait and mobility  Unsteadiness on feet  Other symptoms and signs involving the nervous system  Balance disorder  Rationale for Evaluation and Treatment Rehabilitation  SUBJECTIVE:    SUBJECTIVE STATEMENT: Pt reports her back is a little sore today -- had to put her dogs in the car to go to the groomers. Saw  doctor yesterday.   PERTINENT HISTORY: h/o parkinsonism,   PAIN:  Are you having pain? Yes: NPRS scale: 1/10 Pain location: Back pain Pain description: Sore Aggravating factors: lifting dogs Relieving factors: rest  PATIENT GOALS I want to be able to walk more normally (not shuffle, not bend forward)  OBJECTIVE:   ___________________________________________________________________________________________________________________________________ From initial eval unless otherwise  stated LOWER EXTREMITY MMT:    MMT Right Eval Left Eval  Hip flexion 3+/5 3+/5  Hip extension    Hip abduction    Hip adduction    Hip internal rotation    Hip external rotation    Knee flexion 5/5 5/5  Knee extension 5/5 5/5  Ankle dorsiflexion 5/5 5/5  (Blank rows = not tested)  FUNCTIONAL TESTs: From eval  5 times sit to stand: 17 seconds Timed up and go (TUG): 11 10 meter walk test: 2.86 ft/sec  Mini Best 14 seconds  10/27 5 times sit to stand: 13 sec TUG: 11 sec  11/28 Mini-BESTest 19   __________________________________________________________________________________________________  [facility] Adult PT Treatment:                                                DATE: 11/26/22 Therapeutic Exercise: Nustep L6 x 5 min UEs/LEs March forward with alternating arms 1 lap with mental task Backwards stepping 1 lap with mental task Side step L&R x 1 lap with mental task L lateral hip shift x10  Neuromuscular re-ed: Step forward on rockerboard x10 Step backward off rockerboard x10 Resisted walking 5# pulleys x 5 each direction    11/22/22 THEREX Nustep L6 x 5 min UEs/LEs Hamstring stretch x 20 sec R&L March forward with alternating arms 1 lap Backwards stepping 1 lap Side step L&R x 1 lap Forward lunge with open arms 2x20'  NMED Standing on airex eyes closed  With perturbations multidirectional x 20 sec   Static 2x30 sec  Feet apart head turns x 30 sec, x30 sec with cognitive task  Feet apart head nods x 30 sec, x30 sec with cognitive task Resisted walking 5# pulleys x 5 each direction    HOME EXERCISE PROGRAM: Access Code: [email or web address] URL: [email or web address] Date: 09/30/2022 Prepared by: Rudell Cobb  Exercises - Sit to Stand with Arm Swing  - 1-2 x daily - 7 x weekly - 2 sets - 10 reps - Side to Side Weight Shift with Overhead Reach and Counter Support  - 1-2 x daily - 7 x weekly - 2 sets - 10 reps - Staggered Stance Weight Shift with  Arms Reaching  - 1-2 x daily - 7 x weekly - 2 sets - 10 reps - Alternating Step Backward with Support  - 1 x daily - 7 x weekly - 2 sets - 10 reps - Alternating Side Step  - 1-2 x daily - 7 x weekly - 2 sets - 10 reps  GOALS: Goals reviewed with patient? Yes  LONG TERM GOALS: Revised Target date: 12/24/2022   The patient will be indep with HEP progression. Baseline: no HEP Goal status:IN PROGRESS  2.  The patient will improve mini Best test to > or equal to 20/28  Baseline: 19/28 on 11/28 Goal status:IN PROGRESS  3.  The patient will verbalize understanding of community resources for PD including POP and PWR community classes. Baseline: No participation in community programs -- provided info on 11/28  Goal status: IN PROGRESS  4.  The patient will verbalize 3 tips to reduce freezing during gait. Baseline: Reports festination during gait. Goal status:IN PROGRESS  ASSESSMENT:  CLINICAL IMPRESSION: Continued resisted walking and improving balance with cognitive tasks. Continued to work on improving stepping strategies  PLAN: PT FREQUENCY: 2x/week  PT DURATION: 8 weeks  PLANNED INTERVENTIONS: Therapeutic exercises, Therapeutic activity, Neuromuscular re-education, Balance training, Gait training, Patient/Family education, Self Care, Joint mobilization, and Joint manipulation  PLAN FOR NEXT SESSION:  Large amplitude movements, dynamic gait training, high level balance, postural control  Loris Winrow April Ma L Dhanvin Szeto, PT, DPT 11/26/2022, 11:02 AM

## 2022-11-29 ENCOUNTER — Encounter: Payer: Self-pay | Admitting: Rehabilitative and Restorative Service Providers"

## 2022-11-29 ENCOUNTER — Ambulatory Visit: Payer: Medicare Other | Admitting: Rehabilitative and Restorative Service Providers"

## 2022-11-29 DIAGNOSIS — M6281 Muscle weakness (generalized): Secondary | ICD-10-CM

## 2022-11-29 DIAGNOSIS — R2681 Unsteadiness on feet: Secondary | ICD-10-CM

## 2022-11-29 DIAGNOSIS — R2689 Other abnormalities of gait and mobility: Secondary | ICD-10-CM | POA: Diagnosis not present

## 2022-11-29 DIAGNOSIS — R29818 Other symptoms and signs involving the nervous system: Secondary | ICD-10-CM | POA: Diagnosis not present

## 2022-11-29 DIAGNOSIS — H903 Sensorineural hearing loss, bilateral: Secondary | ICD-10-CM | POA: Diagnosis not present

## 2022-11-29 NOTE — Therapy (Addendum)
OUTPATIENT PHYSICAL THERAPY TREATMENT   Patient Name: Chelsey Estes MRN: [id number] DOB:February 11, 1941, 81 y.o., female Today's Date: 11/29/2022   PCP: Iran Planas, [place] REFERRING PROVIDER: Alonza Bogus, DO   PT End of Session - 11/29/22 1020     Visit Number 12    Number of Visits 16    Date for PT Re-Evaluation 12/24/22    Authorization Type medicare and BCBS federal    Progress Note Due on Visit 10    PT Start Time 1022    PT Stop Time 1100    PT Time Calculation (min) 38 min    Activity Tolerance Patient tolerated treatment well    Behavior During Therapy WFL for tasks assessed/performed               Past Medical History:  Diagnosis Date   Anxiety    Arthritis    Depression    Glaucoma    History of kidney stones    7 lithrotripsy   Hyperlipidemia    Hypertension    Parkinson disease    Past Surgical History:  Procedure Laterality Date   ABDOMINAL HYSTERECTOMY     ANTERIOR (CYSTOCELE) AND POSTERIOR REPAIR (RECTOCELE) WITH XENFORM GRAFT AND SACROSPINOUS FIXATION     CATARACT EXTRACTION, BILATERAL     INGUINAL HERNIA REPAIR Left    LUMBAR LAMINECTOMY/DECOMPRESSION MICRODISCECTOMY Right 10/12/2021   Procedure: Right Lumbar four-five Laminectomy for facet/synovial cyst;  Surgeon: Eustace Moore, MD;  Location: [place];  Service: Neurosurgery;  Laterality: Right;   Patient Active Problem List   Diagnosis Date Noted   Kidney stone 11/22/2022   Tinnitus of left ear 07/03/2022   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 07/03/2022   Decreased GFR 04/17/2022   Medication management 04/16/2022   Synovial cyst of lumbar facet joint 09/19/2021   DDD (degenerative disc disease), lumbar 04/11/2021   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 03/09/2021   Palpitations 09/05/2020   SOB (shortness of breath) 09/05/2020   Chest pain 09/05/2020   Mixed hyperlipidemia 06/06/2020   Primary insomnia 06/06/2020    Primary parkinsonism 10/26/2019   Osteoporosis 09/15/2019   Blister of great toe of left foot 08/19/2019   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   Moderate episode of recurrent major depressive disorder ([place]) 02/09/2019   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 02/09/2019   Shuffling gait 02/09/2019   Balance problems 02/09/2019   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   Orthostatic hypotension 02/01/2019   Dizziness 02/01/2019   Primary osteoarthritis of right foot 09/25/2018   Snoring 09/25/2018   Night terrors, adult 09/25/2018   Multiple falls 09/25/2018   Metatarsalgia of left foot 08/20/2018   Chronic foot pain, right 08/20/2018   Left hip pain 04/10/2017   History of shingles 04/08/2017   Adenomatous polyp 04/08/2017   Pure hypercholesterolemia 04/08/2017   History of kidney stones 04/08/2017   Essential hypertension 04/08/2017   Depression, recurrent ([place]) 04/08/2017   Anxiety 04/08/2017   Greater trochanteric bursitis of left hip 04/08/2017    ONSET DATE: 09/10/22  REFERRING DIAG: Parkinson's Disease  THERAPY DIAG:  Muscle weakness (generalized)  Other abnormalities of gait and mobility  Unsteadiness on feet  Rationale for Evaluation and Treatment Rehabilitation  SUBJECTIVE:    SUBJECTIVE STATEMENT: The patient reports her back is bothering her more today. She has scoliosis and is wearing her brace. She feels her stamina is a little bit better when shopping. She has not had falls.  PERTINENT HISTORY: h/o parkinsonism,   PAIN:  Are you having pain? Yes: NPRS scale: 1/10 Pain location: Back pain Pain description: Sore Aggravating factors: lifting dogs Relieving factors: rest  PATIENT GOALS I want to be able to walk more normally (not shuffle, not bend forward)  OBJECTIVE:   ___________________________________________________________________________________________________________________________ From initial eval unless otherwise stated LOWER EXTREMITY MMT:     MMT Right Eval Left Eval  Hip flexion 3+/5 3+/5  Knee flexion 5/5 5/5  Knee extension 5/5 5/5  Ankle dorsiflexion 5/5 5/5  (Blank rows = not tested)  FUNCTIONAL TESTs: From eval  5 times sit to stand: 17 seconds Timed up and go (TUG): 11 10 meter walk test: 2.86 ft/sec  Mini Best 14 seconds  10/27 5 times sit to stand: 13 sec TUG: 11 sec  11/28 Mini-BESTest 19 ________________________________________________________________________________________________  [facility] Adult PT Treatment:                                                DATE: 11/29/22 Therapeutic Exercise: Nu-step level 5 x 5 minutes Ues/Les  Large amplitude sit<>stand x 10 reps without UE support Standing lateral trunk glide Therapeutic Activity: Obstacle negotiation with cones x figure 8 Narrow spaces discussing freezing techniques Seated large amplitude sideways stepping to mimic car transfers Picking up 5# kettle bell and carrying around gym for balance, functional strengthening Gait: Gait with cues on longer stride length x 100 ft Treadmill 1.2 mph with cues on heel strike Self Care: Discussed community classes and patient wants to begin after d/c from PT   [facility] Adult PT Treatment:                                                DATE: 11/26/22 Therapeutic Exercise: Nustep L6 x 5 min UEs/LEs March forward with alternating arms 1 lap with mental task Backwards stepping 1 lap with mental task Side step L&R x 1 lap with mental task L lateral hip shift x10  Neuromuscular re-ed: Step forward on rockerboard x10 Step backward off rockerboard x10 Resisted walking 5# pulleys x 5 each direction  HOME EXERCISE PROGRAM: Access Code: [email or web address] URL: [email or web address] Date: 09/30/2022 Prepared by: Rudell Cobb  Exercises - Sit to Stand with Arm Swing  - 1-2 x daily - 7 x weekly - 2 sets - 10 reps - Side to Side Weight Shift with Overhead Reach and Counter Support  - 1-2 x daily - 7 x  weekly - 2 sets - 10 reps - Staggered Stance Weight Shift with Arms Reaching  - 1-2 x daily - 7 x weekly - 2 sets - 10 reps - Alternating Step Backward with Support  - 1 x daily - 7 x weekly - 2 sets - 10 reps - Alternating Side Step  - 1-2 x daily - 7 x weekly - 2 sets - 10 reps  GOALS: Goals reviewed with patient? Yes  LONG TERM GOALS: Revised Target date: 12/24/2022   The patient will be indep with HEP progression. Baseline: no HEP Goal status:IN PROGRESS  2.  The patient will improve mini Best test to > or equal to 20/28  Baseline: 19/28 on 11/28 Goal status:IN PROGRESS  3.  The patient will verbalize understanding of community resources  for PD including POP and PWR community classes. Baseline: No participation in community programs -- provided info on 11/28 Goal status: IN PROGRESS  4.  The patient will verbalize 3 tips to reduce freezing during gait. Baseline: Reports festination during gait. Goal status:IN PROGRESS  ASSESSMENT:  CLINICAL IMPRESSION: Continued resisted walking and improving balance with cognitive tasks. Continued to work on improving stepping strategies  PLAN: PT FREQUENCY: 2x/week  PT DURATION: 8 weeks  PLANNED INTERVENTIONS: Therapeutic exercises, Therapeutic activity, Neuromuscular re-education, Balance training, Gait training, Patient/Family education, Self Care, Joint mobilization, and Joint manipulation  PLAN FOR NEXT SESSION:  Large amplitude movements, dynamic gait training, high level balance, postural control. Planning for d/c by end of LTG date.  [place], PT 11/29/2022, 11:37 AM

## 2022-11-29 NOTE — Addendum Note (Signed)
Addended by: Colbert Ewing MARIE L on: 11/29/2022 11:22 AM   Modules accepted: Orders

## 2022-12-02 ENCOUNTER — Encounter: Payer: Self-pay | Admitting: Physician Assistant

## 2022-12-02 DIAGNOSIS — E559 Vitamin D deficiency, unspecified: Secondary | ICD-10-CM | POA: Insufficient documentation

## 2022-12-02 NOTE — Progress Notes (Signed)
Requested results from [facility]

## 2022-12-03 ENCOUNTER — Encounter: Payer: Self-pay | Admitting: Physical Therapy

## 2022-12-03 ENCOUNTER — Ambulatory Visit: Payer: Medicare Other | Admitting: Physical Therapy

## 2022-12-03 DIAGNOSIS — R29818 Other symptoms and signs involving the nervous system: Secondary | ICD-10-CM

## 2022-12-03 DIAGNOSIS — R2689 Other abnormalities of gait and mobility: Secondary | ICD-10-CM

## 2022-12-03 DIAGNOSIS — M6281 Muscle weakness (generalized): Secondary | ICD-10-CM | POA: Diagnosis not present

## 2022-12-03 DIAGNOSIS — H903 Sensorineural hearing loss, bilateral: Secondary | ICD-10-CM | POA: Diagnosis not present

## 2022-12-03 DIAGNOSIS — R2681 Unsteadiness on feet: Secondary | ICD-10-CM | POA: Diagnosis not present

## 2022-12-03 NOTE — Therapy (Signed)
OUTPATIENT PHYSICAL THERAPY TREATMENT   Patient Name: Chelsey Estes MRN: [id number] DOB:02-07-1941, 81 y.o., female Today's Date: 12/03/2022   PCP: Iran Planas, [place] REFERRING PROVIDER: Alonza Bogus, DO   PT End of Session - 12/03/22 1105     Visit Number 13    Number of Visits 16    Date for PT Re-Evaluation 12/24/22    Authorization Type medicare and BCBS federal    Progress Note Due on Visit 10    PT Start Time 1105    PT Stop Time 1145    PT Time Calculation (min) 40 min    Activity Tolerance Patient tolerated treatment well    Behavior During Therapy WFL for tasks assessed/performed                Past Medical History:  Diagnosis Date   Anxiety    Arthritis    Depression    Glaucoma    History of kidney stones    7 lithrotripsy   Hyperlipidemia    Hypertension    Parkinson disease    Past Surgical History:  Procedure Laterality Date   ABDOMINAL HYSTERECTOMY     ANTERIOR (CYSTOCELE) AND POSTERIOR REPAIR (RECTOCELE) WITH XENFORM GRAFT AND SACROSPINOUS FIXATION     CATARACT EXTRACTION, BILATERAL     INGUINAL HERNIA REPAIR Left    LUMBAR LAMINECTOMY/DECOMPRESSION MICRODISCECTOMY Right 10/12/2021   Procedure: Right Lumbar four-five Laminectomy for facet/synovial cyst;  Surgeon: Eustace Moore, MD;  Location: [place];  Service: Neurosurgery;  Laterality: Right;   Patient Active Problem List   Diagnosis Date Noted   Vitamin D deficiency 12/02/2022   Kidney stone 11/22/2022   Tinnitus of left ear 07/03/2022   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 07/03/2022   Decreased GFR 04/17/2022   Medication management 04/16/2022   Synovial cyst of lumbar facet joint 09/19/2021   DDD (degenerative disc disease), lumbar 04/11/2021   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 03/09/2021   Palpitations 09/05/2020   SOB (shortness of breath) 09/05/2020   Chest pain 09/05/2020   Mixed hyperlipidemia 06/06/2020    Primary insomnia 06/06/2020   Primary parkinsonism 10/26/2019   Osteoporosis 09/15/2019   Blister of great toe of left foot 08/19/2019   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   Moderate episode of recurrent major depressive disorder ([place]) 02/09/2019   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 02/09/2019   Shuffling gait 02/09/2019   Balance problems 02/09/2019   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   Orthostatic hypotension 02/01/2019   Dizziness 02/01/2019   Primary osteoarthritis of right foot 09/25/2018   Snoring 09/25/2018   Night terrors, adult 09/25/2018   Multiple falls 09/25/2018   Metatarsalgia of left foot 08/20/2018   Chronic foot pain, right 08/20/2018   Left hip pain 04/10/2017   History of shingles 04/08/2017   Adenomatous polyp 04/08/2017   Pure hypercholesterolemia 04/08/2017   History of kidney stones 04/08/2017   Essential hypertension 04/08/2017   Depression, recurrent ([place]) 04/08/2017   Anxiety 04/08/2017   Greater trochanteric bursitis of left hip 04/08/2017    ONSET DATE: 09/10/22  REFERRING DIAG: Parkinson's Disease  THERAPY DIAG:  Muscle weakness (generalized)  Other abnormalities of gait and mobility  Unsteadiness on feet  Other symptoms and signs involving the nervous system  Balance disorder  Rationale for Evaluation and Treatment Rehabilitation  SUBJECTIVE:    SUBJECTIVE STATEMENT: Pt states she is feeling tired -- woke up at 3am due to her daughter's dog and  has not been back to sleep.   PERTINENT HISTORY: h/o parkinsonism,   PAIN:  Are you having pain? Yes: NPRS scale: 1/10 Pain location: Back pain Pain description: Sore Aggravating factors: lifting dogs Relieving factors: rest  PATIENT GOALS I want to be able to walk more normally (not shuffle, not bend forward)  OBJECTIVE:   ___________________________________________________________________________________________________________________________ From initial eval unless  otherwise stated LOWER EXTREMITY MMT:    MMT Right Eval Left Eval  Hip flexion 3+/5 3+/5  Knee flexion 5/5 5/5  Knee extension 5/5 5/5  Ankle dorsiflexion 5/5 5/5  (Blank rows = not tested)  FUNCTIONAL TESTs: From eval  5 times sit to stand: 17 seconds Timed up and go (TUG): 11 10 meter walk test: 2.86 ft/sec  Mini Best 14 seconds  10/27 5 times sit to stand: 13 sec TUG: 11 sec  11/28 Mini-BESTest 19 ________________________________________________________________________________________________  [facility] Adult PT Treatment:                                                DATE: 12/03/22 Therapeutic Exercise: Nu-step L6 x 5 min UEs/LEs Large amplitude sit<>stand 2x 10 reps without UE support Therapeutic Activity: Seated large amplitude sideways stepping to mimic car transfers x10 Walk and step over obstacles Treadmill walking 0.7 mph with beat first with bilat UE, then single UE, then finger, and then no UE support x 6 min Forward step ups with 5# KB 2x10  [facility] Adult PT Treatment:                                                DATE: 11/29/22 Therapeutic Exercise: Nu-step level 5 x 5 minutes Ues/Les  Large amplitude sit<>stand x 10 reps without UE support Standing lateral trunk glide Therapeutic Activity: Obstacle negotiation with cones x figure 8 Narrow spaces discussing freezing techniques Seated large amplitude sideways stepping to mimic car transfers Picking up 5# kettle bell and carrying around gym for balance, functional strengthening Gait: Gait with cues on longer stride length x 100 ft Treadmill 1.2 mph with cues on heel strike Self Care: Discussed community classes and patient wants to begin after d/c from PT   HOME EXERCISE PROGRAM: Access Code: [email or web address] URL: [email or web address] Date: 09/30/2022 Prepared by: Rudell Cobb  Exercises - Sit to Stand with Arm Swing  - 1-2 x daily - 7 x weekly - 2 sets - 10 reps - Side to Side Weight  Shift with Overhead Reach and Counter Support  - 1-2 x daily - 7 x weekly - 2 sets - 10 reps - Staggered Stance Weight Shift with Arms Reaching  - 1-2 x daily - 7 x weekly - 2 sets - 10 reps - Alternating Step Backward with Support  - 1 x daily - 7 x weekly - 2 sets - 10 reps - Alternating Side Step  - 1-2 x daily - 7 x weekly - 2 sets - 10 reps  GOALS: Goals reviewed with patient? Yes  LONG TERM GOALS: Revised Target date: 12/24/2022   The patient will be indep with HEP progression. Baseline: no HEP Goal status:IN PROGRESS  2.  The patient will improve mini Best test to > or equal to 20/28  Baseline: 19/28 on 11/28 Goal status:IN  PROGRESS  3.  The patient will verbalize understanding of community resources for PD including POP and PWR community classes. Baseline: No participation in community programs -- provided info on 11/28 Goal status: IN PROGRESS  4.  The patient will verbalize 3 tips to reduce freezing during gait. Baseline: Reports festination during gait. Goal status:IN PROGRESS  ASSESSMENT:  CLINICAL IMPRESSION: Treatment session focused primarily on stepping and dynamic balance. Pt with difficulty pacing and maintaining balance on treadmill without UE support. Continued to work on large amplitude movements.   PLAN: PT FREQUENCY: 2x/week  PT DURATION: 8 weeks  PLANNED INTERVENTIONS: Therapeutic exercises, Therapeutic activity, Neuromuscular re-education, Balance training, Gait training, Patient/Family education, Self Care, Joint mobilization, and Joint manipulation  PLAN FOR NEXT SESSION:  Large amplitude movements, dynamic gait training, high level balance, postural control. Planning for d/c by end of LTG date.  Wyoma Genson April Ma L Maranatha Grossi, PT 12/03/2022, 11:05 AM

## 2022-12-06 ENCOUNTER — Ambulatory Visit: Payer: Medicare Other

## 2022-12-06 DIAGNOSIS — R29818 Other symptoms and signs involving the nervous system: Secondary | ICD-10-CM

## 2022-12-06 DIAGNOSIS — H903 Sensorineural hearing loss, bilateral: Secondary | ICD-10-CM | POA: Diagnosis not present

## 2022-12-06 DIAGNOSIS — R2681 Unsteadiness on feet: Secondary | ICD-10-CM

## 2022-12-06 DIAGNOSIS — R2689 Other abnormalities of gait and mobility: Secondary | ICD-10-CM | POA: Diagnosis not present

## 2022-12-06 DIAGNOSIS — M6281 Muscle weakness (generalized): Secondary | ICD-10-CM

## 2022-12-06 NOTE — Therapy (Signed)
OUTPATIENT PHYSICAL THERAPY TREATMENT   Patient Name: Chelsey Estes MRN: [id number] DOB:Dec 06, 1941, 81 y.o., female Today's Date: 12/06/2022   PCP: Iran Planas, [place] REFERRING PROVIDER: Alonza Bogus, DO   PT End of Session - 12/06/22 1018     Visit Number 14    Number of Visits 16    Date for PT Re-Evaluation 12/24/22    PT Start Time 1017    PT Stop Time 1101    PT Time Calculation (min) 44 min    Activity Tolerance Patient tolerated treatment well    Behavior During Therapy WFL for tasks assessed/performed                Past Medical History:  Diagnosis Date   Anxiety    Arthritis    Depression    Glaucoma    History of kidney stones    7 lithrotripsy   Hyperlipidemia    Hypertension    Parkinson disease    Past Surgical History:  Procedure Laterality Date   ABDOMINAL HYSTERECTOMY     ANTERIOR (CYSTOCELE) AND POSTERIOR REPAIR (RECTOCELE) WITH XENFORM GRAFT AND SACROSPINOUS FIXATION     CATARACT EXTRACTION, BILATERAL     INGUINAL HERNIA REPAIR Left    LUMBAR LAMINECTOMY/DECOMPRESSION MICRODISCECTOMY Right 10/12/2021   Procedure: Right Lumbar four-five Laminectomy for facet/synovial cyst;  Surgeon: Eustace Moore, MD;  Location: [place];  Service: Neurosurgery;  Laterality: Right;   Patient Active Problem List   Diagnosis Date Noted   Vitamin D deficiency 12/02/2022   Kidney stone 11/22/2022   Tinnitus of left ear 07/03/2022   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 07/03/2022   Decreased GFR 04/17/2022   Medication management 04/16/2022   Synovial cyst of lumbar facet joint 09/19/2021   DDD (degenerative disc disease), lumbar 04/11/2021   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 03/09/2021   Palpitations 09/05/2020   SOB (shortness of breath) 09/05/2020   Chest pain 09/05/2020   Mixed hyperlipidemia 06/06/2020   Primary insomnia 06/06/2020   Primary parkinsonism 10/26/2019   Osteoporosis  09/15/2019   Blister of great toe of left foot 08/19/2019   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   Moderate episode of recurrent major depressive disorder ([place]) 02/09/2019   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 02/09/2019   Shuffling gait 02/09/2019   Balance problems 02/09/2019   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   Orthostatic hypotension 02/01/2019   Dizziness 02/01/2019   Primary osteoarthritis of right foot 09/25/2018   Snoring 09/25/2018   Night terrors, adult 09/25/2018   Multiple falls 09/25/2018   Metatarsalgia of left foot 08/20/2018   Chronic foot pain, right 08/20/2018   Left hip pain 04/10/2017   History of shingles 04/08/2017   Adenomatous polyp 04/08/2017   Pure hypercholesterolemia 04/08/2017   History of kidney stones 04/08/2017   Essential hypertension 04/08/2017   Depression, recurrent ([place]) 04/08/2017   Anxiety 04/08/2017   Greater trochanteric bursitis of left hip 04/08/2017    ONSET DATE: 09/10/22  REFERRING DIAG: Parkinson's Disease  THERAPY DIAG:  Muscle weakness (generalized)  Other abnormalities of gait and mobility  Unsteadiness on feet  Other symptoms and signs involving the nervous system  Balance disorder  Rationale for Evaluation and Treatment Rehabilitation  SUBJECTIVE:    SUBJECTIVE STATEMENT: Patient states she did not get very much sleep due to being woken up at 4am by her daughter's dog. Patient states she is feeling unsteady today.  PERTINENT HISTORY: h/o parkinsonism,   PAIN:  Are you having pain? Yes: NPRS scale: 0/10 Pain location: Back pain Pain description: Sore Aggravating factors: lifting dogs Relieving factors: rest  PATIENT GOALS I want to be able to walk more normally (not shuffle, not bend forward)  OBJECTIVE:   ___________________________________________________________________________________________________________________________ From initial eval unless otherwise stated LOWER EXTREMITY MMT:    MMT  Right Eval Left Eval  Hip flexion 3+/5 3+/5  Knee flexion 5/5 5/5  Knee extension 5/5 5/5  Ankle dorsiflexion 5/5 5/5  (Blank rows = not tested)  FUNCTIONAL TESTs: From eval  5 times sit to stand: 17 seconds Timed up and go (TUG): 11 10 meter walk test: 2.86 ft/sec  Mini Best 14 seconds  10/27 5 times sit to stand: 13 sec TUG: 11 sec  11/28 Mini-BESTest 19 ________________________________________________________________________________________________  [facility] Adult PT Treatment:                                                DATE: 12/06/2022 Therapeutic Activity: NuStep L3 x 5 min to progress reciprocal gait pattern Step forward & sideways with arms reaching to sides Fwd/bkwd amb amb with 5#KB purse carry 2L each (alt sides) Cone pick up + overhead reach + stepping over obstacles Colored dot toe taps + opposite arm overhead reach (therapist color call out)    [facility] Adult PT Treatment:                                                DATE: 12/03/22 Therapeutic Exercise: Nu-step L6 x 5 min UEs/LEs Large amplitude sit<>stand 2x 10 reps without UE support Therapeutic Activity: Seated large amplitude sideways stepping to mimic car transfers x10 Walk and step over obstacles Treadmill walking 0.7 mph with beat first with bilat UE, then single UE, then finger, and then no UE support x 6 min Forward step ups with 5# KB 2x10   HOME EXERCISE PROGRAM: Access Code: [email or web address] URL: [email or web address] Date: 09/30/2022 Prepared by: Rudell Cobb  Exercises - Sit to Stand with Arm Swing  - 1-2 x daily - 7 x weekly - 2 sets - 10 reps - Side to Side Weight Shift with Overhead Reach and Counter Support  - 1-2 x daily - 7 x weekly - 2 sets - 10 reps - Staggered Stance Weight Shift with Arms Reaching  - 1-2 x daily - 7 x weekly - 2 sets - 10 reps - Alternating Step Backward with Support  - 1 x daily - 7 x weekly - 2 sets - 10 reps - Alternating Side Step  - 1-2 x daily  - 7 x weekly - 2 sets - 10 reps  GOALS: Goals reviewed with patient? Yes  LONG TERM GOALS: Revised Target date: 12/24/2022   The patient will be indep with HEP progression. Baseline: no HEP Goal status:IN PROGRESS  2.  The patient will improve mini Best test to > or equal to 20/28  Baseline: 19/28 on 11/28 Goal status:IN PROGRESS  3.  The patient will verbalize understanding of community resources for PD including POP and PWR community classes. Baseline: No participation in community programs -- provided info on 11/28 Goal status: IN PROGRESS  4.  The patient will verbalize 3 tips to reduce freezing during gait. Baseline: Reports  festination during gait. Goal status:IN PROGRESS  ASSESSMENT:  CLINICAL IMPRESSION: Occasional mild LOB exhibited during dynamic weight shifting activity, however patient able to regain balance with stepping strategy. Mild perturbations added with unilateral weight carry to challenge postural stability. Cueing provided to improve upright standing posture; increased forward flexed hip posture increased during backwards walking.  PLAN: PT FREQUENCY: 2x/week  PT DURATION: 8 weeks  PLANNED INTERVENTIONS: Therapeutic exercises, Therapeutic activity, Neuromuscular re-education, Balance training, Gait training, Patient/Family education, Self Care, Joint mobilization, and Joint manipulation  PLAN FOR NEXT SESSION:  Large amplitude movements, dynamic gait training, high level balance, postural control. Planning for d/c by end of LTG date.  Hardin Negus, PTA 12/06/2022, 11:04 AM

## 2022-12-10 ENCOUNTER — Encounter: Payer: Self-pay | Admitting: Physical Therapy

## 2022-12-10 ENCOUNTER — Ambulatory Visit: Payer: Medicare Other | Admitting: Physical Therapy

## 2022-12-10 DIAGNOSIS — R29818 Other symptoms and signs involving the nervous system: Secondary | ICD-10-CM | POA: Diagnosis not present

## 2022-12-10 DIAGNOSIS — R2689 Other abnormalities of gait and mobility: Secondary | ICD-10-CM

## 2022-12-10 DIAGNOSIS — H903 Sensorineural hearing loss, bilateral: Secondary | ICD-10-CM | POA: Diagnosis not present

## 2022-12-10 DIAGNOSIS — M6281 Muscle weakness (generalized): Secondary | ICD-10-CM

## 2022-12-10 DIAGNOSIS — R2681 Unsteadiness on feet: Secondary | ICD-10-CM | POA: Diagnosis not present

## 2022-12-10 NOTE — Therapy (Signed)
OUTPATIENT PHYSICAL THERAPY TREATMENT   Patient Name: Chelsey Estes MRN: [id number] DOB:1941/07/16, 81 y.o., female Today's Date: 12/10/2022   PCP: Iran Planas, [place] REFERRING PROVIDER: Alonza Bogus, DO   PT End of Session - 12/10/22 1105     Visit Number 15    Number of Visits 16    Date for PT Re-Evaluation 12/24/22    Authorization Type medicare and BCBS federal    Progress Note Due on Visit 20    PT Start Time 1106    PT Stop Time 1145    PT Time Calculation (min) 39 min    Activity Tolerance Patient tolerated treatment well    Behavior During Therapy WFL for tasks assessed/performed                Past Medical History:  Diagnosis Date   Anxiety    Arthritis    Depression    Glaucoma    History of kidney stones    7 lithrotripsy   Hyperlipidemia    Hypertension    Parkinson disease    Past Surgical History:  Procedure Laterality Date   ABDOMINAL HYSTERECTOMY     ANTERIOR (CYSTOCELE) AND POSTERIOR REPAIR (RECTOCELE) WITH XENFORM GRAFT AND SACROSPINOUS FIXATION     CATARACT EXTRACTION, BILATERAL     INGUINAL HERNIA REPAIR Left    LUMBAR LAMINECTOMY/DECOMPRESSION MICRODISCECTOMY Right 10/12/2021   Procedure: Right Lumbar four-five Laminectomy for facet/synovial cyst;  Surgeon: Eustace Moore, MD;  Location: [place];  Service: Neurosurgery;  Laterality: Right;   Patient Active Problem List   Diagnosis Date Noted   Vitamin D deficiency 12/02/2022   Kidney stone 11/22/2022   Tinnitus of left ear 07/03/2022   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 07/03/2022   Decreased GFR 04/17/2022   Medication management 04/16/2022   Synovial cyst of lumbar facet joint 09/19/2021   DDD (degenerative disc disease), lumbar 04/11/2021   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 03/09/2021   Palpitations 09/05/2020   SOB (shortness of breath) 09/05/2020   Chest pain 09/05/2020   Mixed hyperlipidemia 06/06/2020    Primary insomnia 06/06/2020   Primary parkinsonism 10/26/2019   Osteoporosis 09/15/2019   Blister of great toe of left foot 08/19/2019   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   Moderate episode of recurrent major depressive disorder ([place]) 02/09/2019   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 02/09/2019   Shuffling gait 02/09/2019   Balance problems 02/09/2019   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   Orthostatic hypotension 02/01/2019   Dizziness 02/01/2019   Primary osteoarthritis of right foot 09/25/2018   Snoring 09/25/2018   Night terrors, adult 09/25/2018   Multiple falls 09/25/2018   Metatarsalgia of left foot 08/20/2018   Chronic foot pain, right 08/20/2018   Left hip pain 04/10/2017   History of shingles 04/08/2017   Adenomatous polyp 04/08/2017   Pure hypercholesterolemia 04/08/2017   History of kidney stones 04/08/2017   Essential hypertension 04/08/2017   Depression, recurrent ([place]) 04/08/2017   Anxiety 04/08/2017   Greater trochanteric bursitis of left hip 04/08/2017    ONSET DATE: 09/10/22  REFERRING DIAG: Parkinson's Disease  THERAPY DIAG:  Muscle weakness (generalized)  Other abnormalities of gait and mobility  Unsteadiness on feet  Other symptoms and signs involving the nervous system  Balance disorder  Rationale for Evaluation and Treatment Rehabilitation  SUBJECTIVE:    SUBJECTIVE STATEMENT: Pt reports last session she felt a back spasm after doing a reach down exercise with 10#  KB.   PERTINENT HISTORY: h/o parkinsonism,   PAIN:  Are you having pain? Yes: NPRS scale: 0/10 Pain location: Back pain Pain description: Sore Aggravating factors: lifting dogs Relieving factors: rest  PATIENT GOALS I want to be able to walk more normally (not shuffle, not bend forward)  OBJECTIVE:   ___________________________________________________________________________________________________________________________ From initial eval unless otherwise  stated LOWER EXTREMITY MMT:    MMT Right Eval Left Eval  Hip flexion 3+/5 3+/5  Knee flexion 5/5 5/5  Knee extension 5/5 5/5  Ankle dorsiflexion 5/5 5/5  (Blank rows = not tested)  FUNCTIONAL TESTs: From eval  5 times sit to stand: 17 seconds Timed up and go (TUG): 11 10 meter walk test: 2.86 ft/sec  Mini Best 14 seconds  10/27 5 times sit to stand: 13 sec TUG: 11 sec  11/28 Mini-BESTest 19 ________________________________________________________________________________________________   [facility] Adult PT Treatment:                                                DATE: 12/10/22 Therapeutic Exercise: Sitting figure 4 stretch x30 sec Sitting lumbar extension x 30 sec Sitting thoracic extension in chair x 30 sec Neuromuscular re-ed: Rockerboard Static balance with board in P/A position 2x30 sec Step off board P/A position x10 Lateral weight shifts x10 Static balance with board in lateral position 2x30 sec Therapeutic Activity: Big forwards walking with arm reach 3# 3x15' Big backwards walking with arm swing 3# 3x15' Big Sit<>stand with arms outstretched 3# x10 Treadmill walking x2 min with bilat UE support, x 2 min with intermittent UE support     [facility] Adult PT Treatment:                                                DATE: 12/06/2022 Therapeutic Activity: NuStep L3 x 5 min to progress reciprocal gait pattern Step forward & sideways with arms reaching to sides Fwd/bkwd amb amb with 5#KB purse carry 2L each (alt sides) Cone pick up + overhead reach + stepping over obstacles Colored dot toe taps + opposite arm overhead reach (therapist color call out)   HOME EXERCISE PROGRAM: Access Code: [email or web address] URL: [email or web address] Date: 09/30/2022 Prepared by: Rudell Cobb  Exercises - Sit to Stand with Arm Swing  - 1-2 x daily - 7 x weekly - 2 sets - 10 reps - Side to Side Weight Shift with Overhead Reach and Counter Support  - 1-2 x daily - 7 x weekly  - 2 sets - 10 reps - Staggered Stance Weight Shift with Arms Reaching  - 1-2 x daily - 7 x weekly - 2 sets - 10 reps - Alternating Step Backward with Support  - 1 x daily - 7 x weekly - 2 sets - 10 reps - Alternating Side Step  - 1-2 x daily - 7 x weekly - 2 sets - 10 reps  GOALS: Goals reviewed with patient? Yes  LONG TERM GOALS: Revised Target date: 12/24/2022   The patient will be indep with HEP progression. Baseline: no HEP Goal status:IN PROGRESS  2.  The patient will improve mini Best test to > or equal to 20/28  Baseline: 19/28 on 11/28 Goal status:IN PROGRESS  3.  The patient will verbalize understanding of  community resources for PD including POP and PWR community classes. Baseline: No participation in community programs -- provided info on 11/28 Goal status: IN PROGRESS  4.  The patient will verbalize 3 tips to reduce freezing during gait. Baseline: Reports festination during gait. Goal status:IN PROGRESS  ASSESSMENT:  CLINICAL IMPRESSION: Continued to work on large amplitude movement and dynamic balance. Difficulty with lateral stepping off rockerboard for stepping strategies. More fatigued at end of session when trying to perform treadmill.   PLAN: PT FREQUENCY: 2x/week  PT DURATION: 8 weeks  PLANNED INTERVENTIONS: Therapeutic exercises, Therapeutic activity, Neuromuscular re-education, Balance training, Gait training, Patient/Family education, Self Care, Joint mobilization, and Joint manipulation  PLAN FOR NEXT SESSION:  Large amplitude movements, dynamic gait training, high level balance, postural control. Planning for d/c by end of LTG date.  Amarianna Abplanalp April Ma L Ermalinda Joubert, PT 12/10/2022, 11:06 AM

## 2022-12-12 ENCOUNTER — Ambulatory Visit: Payer: Medicare Other | Admitting: Audiology

## 2022-12-12 DIAGNOSIS — R2681 Unsteadiness on feet: Secondary | ICD-10-CM | POA: Diagnosis not present

## 2022-12-12 DIAGNOSIS — R29818 Other symptoms and signs involving the nervous system: Secondary | ICD-10-CM | POA: Diagnosis not present

## 2022-12-12 DIAGNOSIS — R2689 Other abnormalities of gait and mobility: Secondary | ICD-10-CM | POA: Diagnosis not present

## 2022-12-12 DIAGNOSIS — H903 Sensorineural hearing loss, bilateral: Secondary | ICD-10-CM

## 2022-12-12 DIAGNOSIS — M6281 Muscle weakness (generalized): Secondary | ICD-10-CM | POA: Diagnosis not present

## 2022-12-12 NOTE — Procedures (Signed)
  Outpatient Audiology and [place], [place]  [address] [phone]  AUDIOLOGICAL  EVALUATION  NAME: Chelsey Estes     DOB:   Nov 15, 1941      MRN: [id number]                                                                                     DATE: 12/12/2022     REFERENT: Donella Stade, PA-C STATUS: Outpatient DIAGNOSIS: Sensorineural hearing loss, bilateral    History: Kylea was seen for an audiological evaluation due to decreased hearing. Jadasia was accompanied to the appointment by her sister. Gladys reports sudden hearing loss in the left ear occurring in July. Carra reports she heard a "pop" in the left ear followed by hearing loss. Rickie reports she had an audiological evaluation at [facility] however has not had any follow up. Greyson reports bilateral tinnitus which she describes as "buzzing" or "bees." Stasha reports the tinnitus has been present for many years and is not bothersome. Kaleigha denies otalgia, aural fullness, and dizziness.   Evaluation:  Otoscopy showed a clear view of the tympanic membranes, bilaterally Tympanometry results were consistent with normal middle ear pressure and normal tympanic membrane mobility (Type A), bilaterally Audiometric testing was completed using Conventional Audiometry techniques with insert earphones and TDH headphones. Test results are consistent with in the right ear with  normal hearing sensitivity sloping to a moderate sensorineural hearing loss and in the left ear with a mild hearing loss sloping to a moderate sensorineural hearing loss. Asymmetry noted at [phone] Hz and 4000-6000 Hz, worse int he left ear. Speech Recognition Thresholds were obtained at 35 dB HL in the right ear and at 45  dB HL in the left ear. Word Recognition Testing was completed at 70 dB HL and [place] scored 96% in the right ear and was completed at 80 dB HL and [place] scored 80% in the left ear.      Results:  The test results  were reviewed with Chantia and her sister. Today's test results are consistent with in the right ear with  normal hearing sensitivity sloping to a moderate sensorineural hearing loss and in the left ear with a mild hearing loss sloping to a moderate sensorineural hearing loss. Asymmetry noted at [phone] Hz and 4000-6000 Hz, worse int he left ear. Sanaai will have communication difficulty in all listening environments. She will benefit from the use of good communication strategies and the use of amplification. A referral to ENT was reviewed due to asymmetric hearing loss. Eyanna was given a list of hearing aid providers in the [place] area.   Recommendations: 1.   Referral to [place] for asymmetric sensorineural hearing loss 2.   Communication Needs Assessment with an Audiologist to discuss hearing aids.    40 minutes spent testing and counseling on results.   If you have any questions please feel free to contact me at [phone].  Bari Mantis Audiologist, Au.D., CCC-A 12/12/2022  2:18 PM  Cc: Donella Stade, PA-C

## 2022-12-13 ENCOUNTER — Ambulatory Visit: Payer: Medicare Other | Admitting: Physical Therapy

## 2022-12-13 ENCOUNTER — Encounter: Payer: Self-pay | Admitting: Physical Therapy

## 2022-12-13 DIAGNOSIS — R2689 Other abnormalities of gait and mobility: Secondary | ICD-10-CM | POA: Diagnosis not present

## 2022-12-13 DIAGNOSIS — R29818 Other symptoms and signs involving the nervous system: Secondary | ICD-10-CM

## 2022-12-13 DIAGNOSIS — H903 Sensorineural hearing loss, bilateral: Secondary | ICD-10-CM | POA: Diagnosis not present

## 2022-12-13 DIAGNOSIS — R2681 Unsteadiness on feet: Secondary | ICD-10-CM

## 2022-12-13 DIAGNOSIS — M6281 Muscle weakness (generalized): Secondary | ICD-10-CM

## 2022-12-13 NOTE — Therapy (Signed)
OUTPATIENT PHYSICAL THERAPY TREATMENT   Patient Name: Chelsey Estes MRN: [id number] DOB:1941-09-30, 81 y.o., female Today's Date: 12/13/2022   PCP: Iran Planas, [place] REFERRING PROVIDER: Alonza Bogus, DO   PT End of Session - 12/13/22 1030     Visit Number 16    Number of Visits 16    Date for PT Re-Evaluation 12/24/22    Authorization Type medicare and BCBS federal    Progress Note Due on Visit 20    PT Start Time 1020    PT Stop Time 1100    PT Time Calculation (min) 40 min    Activity Tolerance Patient tolerated treatment well    Behavior During Therapy WFL for tasks assessed/performed                Past Medical History:  Diagnosis Date   Anxiety    Arthritis    Depression    Glaucoma    History of kidney stones    7 lithrotripsy   Hyperlipidemia    Hypertension    Parkinson disease    Past Surgical History:  Procedure Laterality Date   ABDOMINAL HYSTERECTOMY     ANTERIOR (CYSTOCELE) AND POSTERIOR REPAIR (RECTOCELE) WITH XENFORM GRAFT AND SACROSPINOUS FIXATION     CATARACT EXTRACTION, BILATERAL     INGUINAL HERNIA REPAIR Left    LUMBAR LAMINECTOMY/DECOMPRESSION MICRODISCECTOMY Right 10/12/2021   Procedure: Right Lumbar four-five Laminectomy for facet/synovial cyst;  Surgeon: Eustace Moore, MD;  Location: [place];  Service: Neurosurgery;  Laterality: Right;   Patient Active Problem List   Diagnosis Date Noted   Vitamin D deficiency 12/02/2022   Kidney stone 11/22/2022   Tinnitus of left ear 07/03/2022   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 07/03/2022   Decreased GFR 04/17/2022   Medication management 04/16/2022   Synovial cyst of lumbar facet joint 09/19/2021   DDD (degenerative disc disease), lumbar 04/11/2021   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 03/09/2021   Palpitations 09/05/2020   SOB (shortness of breath) 09/05/2020   Chest pain 09/05/2020   Mixed hyperlipidemia 06/06/2020    Primary insomnia 06/06/2020   Primary parkinsonism 10/26/2019   Osteoporosis 09/15/2019   Blister of great toe of left foot 08/19/2019   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   Moderate episode of recurrent major depressive disorder ([place]) 02/09/2019   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 02/09/2019   Shuffling gait 02/09/2019   Balance problems 02/09/2019   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   Orthostatic hypotension 02/01/2019   Dizziness 02/01/2019   Primary osteoarthritis of right foot 09/25/2018   Snoring 09/25/2018   Night terrors, adult 09/25/2018   Multiple falls 09/25/2018   Metatarsalgia of left foot 08/20/2018   Chronic foot pain, right 08/20/2018   Left hip pain 04/10/2017   History of shingles 04/08/2017   Adenomatous polyp 04/08/2017   Pure hypercholesterolemia 04/08/2017   History of kidney stones 04/08/2017   Essential hypertension 04/08/2017   Depression, recurrent ([place]) 04/08/2017   Anxiety 04/08/2017   Greater trochanteric bursitis of left hip 04/08/2017    ONSET DATE: 09/10/22  REFERRING DIAG: Parkinson's Disease  THERAPY DIAG:  Muscle weakness (generalized)  Other abnormalities of gait and mobility  Unsteadiness on feet  Other symptoms and signs involving the nervous system  Balance disorder  Rationale for Evaluation and Treatment Rehabilitation  SUBJECTIVE:    SUBJECTIVE STATEMENT: Pt reports last session she felt a back spasm after doing a reach down exercise with 10#  KB.   PERTINENT HISTORY: h/o parkinsonism,   PAIN:  Are you having pain? Yes: NPRS scale: 0/10 Pain location: Back pain Pain description: Sore Aggravating factors: lifting dogs Relieving factors: rest  PATIENT GOALS I want to be able to walk more normally (not shuffle, not bend forward)  OBJECTIVE:   ___________________________________________________________________________________________________________________________ From initial eval unless otherwise  stated LOWER EXTREMITY MMT:    MMT Right Eval Left Eval  Hip flexion 3+/5 3+/5  Knee flexion 5/5 5/5  Knee extension 5/5 5/5  Ankle dorsiflexion 5/5 5/5  (Blank rows = not tested)  FUNCTIONAL TESTs: From eval  5 times sit to stand: 17 seconds Timed up and go (TUG): 11 10 meter walk test: 2.86 ft/sec  Mini Best 14 seconds  10/27 5 times sit to stand: 13 sec TUG: 11 sec  11/28 Mini-BESTest 19 ________________________________________________________________________________________________  [facility] Adult PT Treatment:                                                DATE: 12/13/22 Therapeutic Exercise: Nustep L5 x 5 min UEs/LEs Sidelying on bolster for pt's back x2 min Lateral hip shift into wall x 5 Lumbar extensions x 5 Neuromuscular re-ed: "Woodpecker" at wall 2x10 for forward/backward ankle strategy Hip wall offs all directions x 10 each Therapeutic Activity: Big forwards walking with arm reach x1 lap Big backwards walking with arm swing 3# x 1 lap Grapevine 3x15'    [facility] Adult PT Treatment:                                                DATE: 12/10/22 Therapeutic Exercise: Sitting figure 4 stretch x30 sec Sitting lumbar extension x 30 sec Sitting thoracic extension in chair x 30 sec Neuromuscular re-ed: Rockerboard Static balance with board in P/A position 2x30 sec Step off board P/A position x10 Lateral weight shifts x10 Static balance with board in lateral position 2x30 sec Therapeutic Activity: Big forwards walking with arm reach 3# 3x15' Big backwards walking with arm swing 3# 3x15' Big Sit<>stand with arms outstretched 3# x10 Treadmill walking x2 min with bilat UE support, x 2 min with intermittent UE support    [facility] Adult PT Treatment:                                                DATE: 12/06/2022 Therapeutic Activity: NuStep L3 x 5 min to progress reciprocal gait pattern Step forward & sideways with arms reaching to sides Fwd/bkwd amb amb with  5#KB purse carry 2L each (alt sides) Cone pick up + overhead reach + stepping over obstacles Colored dot toe taps + opposite arm overhead reach (therapist color call out)   HOME EXERCISE PROGRAM: Access Code: [email or web address] URL: [email or web address] Date: 09/30/2022 Prepared by: Rudell Cobb  Exercises - Sit to Stand with Arm Swing  - 1-2 x daily - 7 x weekly - 2 sets - 10 reps - Side to Side Weight Shift with Overhead Reach and Counter Support  - 1-2 x daily - 7 x weekly - 2 sets - 10 reps - Staggered Stance Weight Shift  with Arms Reaching  - 1-2 x daily - 7 x weekly - 2 sets - 10 reps - Alternating Step Backward with Support  - 1 x daily - 7 x weekly - 2 sets - 10 reps - Alternating Side Step  - 1-2 x daily - 7 x weekly - 2 sets - 10 reps  GOALS: Goals reviewed with patient? Yes  LONG TERM GOALS: Revised Target date: 12/24/2022   The patient will be indep with HEP progression. Baseline: no HEP Goal status:IN PROGRESS  2.  The patient will improve mini Best test to > or equal to 20/28  Baseline: 19/28 on 11/28 Goal status:IN PROGRESS  3.  The patient will verbalize understanding of community resources for PD including POP and PWR community classes. Baseline: No participation in community programs -- provided info on 11/28 Goal status: IN PROGRESS  4.  The patient will verbalize 3 tips to reduce freezing during gait. Baseline: Reports festination during gait. Goal status:IN PROGRESS  ASSESSMENT:  CLINICAL IMPRESSION: Treatment session focused on continuing large amplitude movements. Worked on hip strategy this session and   PLAN: PT FREQUENCY: 2x/week  PT DURATION: 8 weeks  PLANNED INTERVENTIONS: Therapeutic exercises, Therapeutic activity, Neuromuscular re-education, Balance training, Gait training, Patient/Family education, Self Care, Joint mobilization, and Joint manipulation  PLAN FOR NEXT SESSION:  Large amplitude movements, dynamic gait training,  high level balance, postural control. Planning for d/c by end of LTG date.  Tanyah Debruyne April Ma L Lamount Bankson, PT 12/13/2022, 10:31 AM

## 2022-12-17 ENCOUNTER — Ambulatory Visit: Payer: Medicare Other | Attending: Neurology | Admitting: Physical Therapy

## 2022-12-17 ENCOUNTER — Ambulatory Visit: Payer: Medicare Other | Admitting: Physical Therapy

## 2022-12-17 ENCOUNTER — Encounter: Payer: Self-pay | Admitting: Physical Therapy

## 2022-12-17 DIAGNOSIS — R2681 Unsteadiness on feet: Secondary | ICD-10-CM | POA: Diagnosis not present

## 2022-12-17 DIAGNOSIS — N200 Calculus of kidney: Secondary | ICD-10-CM | POA: Diagnosis not present

## 2022-12-17 DIAGNOSIS — R29818 Other symptoms and signs involving the nervous system: Secondary | ICD-10-CM

## 2022-12-17 DIAGNOSIS — M6281 Muscle weakness (generalized): Secondary | ICD-10-CM | POA: Diagnosis not present

## 2022-12-17 DIAGNOSIS — R8290 Unspecified abnormal findings in urine: Secondary | ICD-10-CM | POA: Diagnosis not present

## 2022-12-17 DIAGNOSIS — R2689 Other abnormalities of gait and mobility: Secondary | ICD-10-CM

## 2022-12-17 DIAGNOSIS — N3281 Overactive bladder: Secondary | ICD-10-CM | POA: Diagnosis not present

## 2022-12-17 DIAGNOSIS — I878 Other specified disorders of veins: Secondary | ICD-10-CM | POA: Diagnosis not present

## 2022-12-17 NOTE — Therapy (Signed)
OUTPATIENT PHYSICAL THERAPY TREATMENT   Patient Name: Chelsey Estes MRN: [id number] DOB:[birth year], 82 y.o., female Today's Date: 12/17/2022   PCP: Iran Planas, [place] REFERRING PROVIDER: Alonza Bogus, DO   PT End of Session - 12/17/22 1144     Visit Number 17    Date for PT Re-Evaluation 12/24/22    Authorization Type medicare and BCBS federal    Progress Note Due on Visit 20    PT Start Time 8127    PT Stop Time 1230    PT Time Calculation (min) 45 min    Activity Tolerance Patient tolerated treatment well    Behavior During Therapy WFL for tasks assessed/performed                Past Medical History:  Diagnosis Date   Anxiety    Arthritis    Depression    Glaucoma    History of kidney stones    7 lithrotripsy   Hyperlipidemia    Hypertension    Parkinson disease    Past Surgical History:  Procedure Laterality Date   ABDOMINAL HYSTERECTOMY     ANTERIOR (CYSTOCELE) AND POSTERIOR REPAIR (RECTOCELE) WITH XENFORM GRAFT AND SACROSPINOUS FIXATION     CATARACT EXTRACTION, BILATERAL     INGUINAL HERNIA REPAIR Left    LUMBAR LAMINECTOMY/DECOMPRESSION MICRODISCECTOMY Right 10/12/2021   Procedure: Right Lumbar four-five Laminectomy for facet/synovial cyst;  Surgeon: Eustace Moore, MD;  Location: [place];  Service: Neurosurgery;  Laterality: Right;   Patient Active Problem List   Diagnosis Date Noted   Vitamin D deficiency 12/02/2022   Kidney stone 11/22/2022   Tinnitus of left ear 07/03/2022   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 07/03/2022   Decreased GFR 04/17/2022   Medication management 04/16/2022   Synovial cyst of lumbar facet joint 09/19/2021   DDD (degenerative disc disease), lumbar 04/11/2021   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 03/09/2021   Palpitations 09/05/2020   SOB (shortness of breath) 09/05/2020   Chest pain 09/05/2020   Mixed hyperlipidemia 06/06/2020   Primary insomnia  06/06/2020   Primary parkinsonism 10/26/2019   Osteoporosis 09/15/2019   Blister of great toe of left foot 08/19/2019   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   Moderate episode of recurrent major depressive disorder ([place]) 02/09/2019   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 02/09/2019   Shuffling gait 02/09/2019   Balance problems 02/09/2019   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   Orthostatic hypotension 02/01/2019   Dizziness 02/01/2019   Primary osteoarthritis of right foot 09/25/2018   Snoring 09/25/2018   Night terrors, adult 09/25/2018   Multiple falls 09/25/2018   Metatarsalgia of left foot 08/20/2018   Chronic foot pain, right 08/20/2018   Left hip pain 04/10/2017   History of shingles 04/08/2017   Adenomatous polyp 04/08/2017   Pure hypercholesterolemia 04/08/2017   History of kidney stones 04/08/2017   Essential hypertension 04/08/2017   Depression, recurrent ([place]) 04/08/2017   Anxiety 04/08/2017   Greater trochanteric bursitis of left hip 04/08/2017    ONSET DATE: 09/10/22  REFERRING DIAG: Parkinson's Disease  THERAPY DIAG:  Muscle weakness (generalized)  Other abnormalities of gait and mobility  Unsteadiness on feet  Other symptoms and signs involving the nervous system  Balance disorder  Rationale for Evaluation and Treatment Rehabilitation  SUBJECTIVE:    SUBJECTIVE STATEMENT: Pt reports she had a doctor's appointment to recheck for any kidney stones. She is currently all clear.   PERTINENT HISTORY: h/o parkinsonism,  PAIN:  Are you having pain? Yes: NPRS scale: 0/10 Pain location: Back pain Pain description: Sore Aggravating factors: lifting dogs Relieving factors: rest  PATIENT GOALS I want to be able to walk more normally (not shuffle, not bend forward)  OBJECTIVE:   ___________________________________________________________________________________________________________________________ From initial eval unless otherwise stated LOWER  EXTREMITY MMT:    MMT Right Eval Left Eval  Hip flexion 3+/5 3+/5  Knee flexion 5/5 5/5  Knee extension 5/5 5/5  Ankle dorsiflexion 5/5 5/5  (Blank rows = not tested)  FUNCTIONAL TESTs: From eval  5 times sit to stand: 17 seconds Timed up and go (TUG): 11 10 meter walk test: 2.86 ft/sec  Mini Best 14 seconds  10/27 5 times sit to stand: 13 sec TUG: 11 sec  11/28 Mini-BESTest 19 ________________________________________________________________________________________________  [facility] Adult PT Treatment:                                                DATE: 12/17/22 Therapeutic Exercise: Nustep L6 x 5 min UEs/LEs Neuromuscular re-ed: "Woodpecker" at wall 2x10 for forward/backward ankle strategy Shoulder wall offs all directions x 10 each Resisted walking 5# cables forward, backward x10 Rockerboard hip forward/back x10, rockerboard hip lateral shift x10 Therapeutic Activity: Big forwards walking with arm reach x1 lap Big backwards walking with arm swing 3# x 1 lap Grapevine 3x15'    [facility] Adult PT Treatment:                                                DATE: 12/13/22 Therapeutic Exercise: Nustep L5 x 5 min UEs/LEs Sidelying on bolster for pt's back x2 min Lateral hip shift into wall x 5 Lumbar extensions x 5 Neuromuscular re-ed: "Woodpecker" at wall 2x10 for forward/backward ankle strategy Hip wall offs all directions x 10 each Therapeutic Activity: Big forwards walking with arm reach x1 lap Big backwards walking with arm swing 3# x 1 lap Grapevine 3x15'    [facility] Adult PT Treatment:                                                DATE: 12/10/22 Therapeutic Exercise: Sitting figure 4 stretch x30 sec Sitting lumbar extension x 30 sec Sitting thoracic extension in chair x 30 sec Neuromuscular re-ed: Rockerboard Static balance with board in P/A position 2x30 sec Step off board P/A position x10 Lateral weight shifts x10 Static balance with board in lateral position  2x30 sec Therapeutic Activity: Big forwards walking with arm reach 3# 3x15' Big backwards walking with arm swing 3# 3x15' Big Sit<>stand with arms outstretched 3# x10 Treadmill walking x2 min with bilat UE support, x 2 min with intermittent UE support    HOME EXERCISE PROGRAM: Access Code: [email or web address] URL: [email or web address] Date: 09/30/2022 Prepared by: Rudell Cobb  Exercises - Sit to Stand with Arm Swing  - 1-2 x daily - 7 x weekly - 2 sets - 10 reps - Side to Side Weight Shift with Overhead Reach and Counter Support  - 1-2 x daily - 7 x weekly - 2 sets - 10 reps - Staggered Stance Weight  Shift with Arms Reaching  - 1-2 x daily - 7 x weekly - 2 sets - 10 reps - Alternating Step Backward with Support  - 1 x daily - 7 x weekly - 2 sets - 10 reps - Alternating Side Step  - 1-2 x daily - 7 x weekly - 2 sets - 10 reps  GOALS: Goals reviewed with patient? Yes  LONG TERM GOALS: Revised Target date: 12/24/2022   The patient will be indep with HEP progression. Baseline: no HEP Goal status:IN PROGRESS  2.  The patient will improve mini Best test to > or equal to 20/28  Baseline: 19/28 on 11/28 Goal status:IN PROGRESS  3.  The patient will verbalize understanding of community resources for PD including POP and PWR community classes. Baseline: No participation in community programs -- provided info on 11/28 Goal status: IN PROGRESS  4.  The patient will verbalize 3 tips to reduce freezing during gait. Baseline: Reports festination during gait. Goal status:IN PROGRESS  ASSESSMENT:  CLINICAL IMPRESSION: Treatment focused on improving ankle and stepping strategies. Greatest difficulty with backwards/posterior weight shifts. Pt is nearing LTGs.   PLAN: PT FREQUENCY: 2x/week  PT DURATION: 8 weeks  PLANNED INTERVENTIONS: Therapeutic exercises, Therapeutic activity, Neuromuscular re-education, Balance training, Gait training, Patient/Family education, Self Care,  Joint mobilization, and Joint manipulation  PLAN FOR NEXT SESSION:  Large amplitude movements, dynamic gait training, high level balance, postural control. Planning for d/c by end of LTG date.  Elois Averitt April Ma L Jesusmanuel Erbes, PT 12/17/2022, 11:44 AM

## 2022-12-19 ENCOUNTER — Encounter: Payer: Self-pay | Admitting: Physical Therapy

## 2022-12-19 ENCOUNTER — Ambulatory Visit: Payer: Medicare Other | Admitting: Physical Therapy

## 2022-12-19 DIAGNOSIS — R2689 Other abnormalities of gait and mobility: Secondary | ICD-10-CM | POA: Diagnosis not present

## 2022-12-19 DIAGNOSIS — R29818 Other symptoms and signs involving the nervous system: Secondary | ICD-10-CM

## 2022-12-19 DIAGNOSIS — M6281 Muscle weakness (generalized): Secondary | ICD-10-CM

## 2022-12-19 DIAGNOSIS — R2681 Unsteadiness on feet: Secondary | ICD-10-CM | POA: Diagnosis not present

## 2022-12-19 NOTE — Therapy (Signed)
OUTPATIENT PHYSICAL THERAPY TREATMENT   Patient Name: Chelsey Estes MRN: [id number] DOB:Feb 21, 1941, 82 y.o., female Today's Date: 12/19/2022   PCP: Iran Planas, [place] REFERRING PROVIDER: Alonza Bogus, DO   PT End of Session - 12/19/22 1027     Visit Number 18    Date for PT Re-Evaluation 12/24/22    Authorization Type medicare and BCBS federal    Progress Note Due on Visit 46    PT Start Time 1021   late arrival   PT Stop Time 1100    PT Time Calculation (min) 39 min    Activity Tolerance Patient tolerated treatment well    Behavior During Therapy WFL for tasks assessed/performed                 Past Medical History:  Diagnosis Date   Anxiety    Arthritis    Depression    Glaucoma    History of kidney stones    7 lithrotripsy   Hyperlipidemia    Hypertension    Parkinson disease    Past Surgical History:  Procedure Laterality Date   ABDOMINAL HYSTERECTOMY     ANTERIOR (CYSTOCELE) AND POSTERIOR REPAIR (RECTOCELE) WITH XENFORM GRAFT AND SACROSPINOUS FIXATION     CATARACT EXTRACTION, BILATERAL     INGUINAL HERNIA REPAIR Left    LUMBAR LAMINECTOMY/DECOMPRESSION MICRODISCECTOMY Right 10/12/2021   Procedure: Right Lumbar four-five Laminectomy for facet/synovial cyst;  Surgeon: Eustace Moore, MD;  Location: [place];  Service: Neurosurgery;  Laterality: Right;   Patient Active Problem List   Diagnosis Date Noted   Vitamin D deficiency 12/02/2022   Kidney stone 11/22/2022   Tinnitus of left ear 07/03/2022   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 07/03/2022   Decreased GFR 04/17/2022   Medication management 04/16/2022   Synovial cyst of lumbar facet joint 09/19/2021   DDD (degenerative disc disease), lumbar 04/11/2021   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 03/09/2021   Palpitations 09/05/2020   SOB (shortness of breath) 09/05/2020   Chest pain 09/05/2020   Mixed hyperlipidemia 06/06/2020   Primary  insomnia 06/06/2020   Primary parkinsonism 10/26/2019   Osteoporosis 09/15/2019   Blister of great toe of left foot 08/19/2019   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   Moderate episode of recurrent major depressive disorder ([place]) 02/09/2019   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 02/09/2019   Shuffling gait 02/09/2019   Balance problems 02/09/2019   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   Orthostatic hypotension 02/01/2019   Dizziness 02/01/2019   Primary osteoarthritis of right foot 09/25/2018   Snoring 09/25/2018   Night terrors, adult 09/25/2018   Multiple falls 09/25/2018   Metatarsalgia of left foot 08/20/2018   Chronic foot pain, right 08/20/2018   Left hip pain 04/10/2017   History of shingles 04/08/2017   Adenomatous polyp 04/08/2017   Pure hypercholesterolemia 04/08/2017   History of kidney stones 04/08/2017   Essential hypertension 04/08/2017   Depression, recurrent ([place]) 04/08/2017   Anxiety 04/08/2017   Greater trochanteric bursitis of left hip 04/08/2017    ONSET DATE: 09/10/22  REFERRING DIAG: Parkinson's Disease  THERAPY DIAG:  Muscle weakness (generalized)  Other abnormalities of gait and mobility  Unsteadiness on feet  Other symptoms and signs involving the nervous system  Balance disorder  Rationale for Evaluation and Treatment Rehabilitation  SUBJECTIVE:    SUBJECTIVE STATEMENT: Pt states she's been busy trying to get her utilities in order.   PERTINENT HISTORY: h/o parkinsonism,  PAIN:  Are you having pain? Yes: NPRS scale: 0/10 Pain location: Back pain Pain description: Sore Aggravating factors: lifting dogs Relieving factors: rest  PATIENT GOALS I want to be able to walk more normally (not shuffle, not bend forward)  OBJECTIVE:   ___________________________________________________________________________________________________________________________ From initial eval unless otherwise stated LOWER EXTREMITY MMT:    MMT Right Eval  Left Eval  Hip flexion 3+/5 3+/5  Knee flexion 5/5 5/5  Knee extension 5/5 5/5  Ankle dorsiflexion 5/5 5/5  (Blank rows = not tested)  FUNCTIONAL TESTs: From eval  5 times sit to stand: 17 seconds Timed up and go (TUG): 11 10 meter walk test: 2.86 ft/sec  Mini Best 14 seconds  10/27 5 times sit to stand: 13 sec TUG: 11 sec  11/28 Mini-BESTest 19  12/19/22  ________________________________________________________________________________________________ [facility] Adult PT Treatment:                                                DATE: 12/19/22 Therapeutic Exercise: Nustep L6 x 5 min UEs/LEs Piriformis stretch sitting 2x30 sec Neuromuscular re-ed: Resisted walking 10# cables forward, L&R, backward x5 each Standing on decline 2x30 sec EO, feet together Standing on decline 2x30 sec EC, feet together Standing on incline 2x30 sec EC, feet together Partial tandem throwing red weighted ball on rebounder x10 Tandem stance throwing red weighted ball on rebounder x 10 Therapeutic Activity: Grapevine 3x15' Walk with fast pivot turns x10   [facility] Adult PT Treatment:                                                DATE: 12/17/22 Therapeutic Exercise: Nustep L6 x 5 min UEs/LEs Neuromuscular re-ed: "Woodpecker" at wall 2x10 for forward/backward ankle strategy Shoulder wall offs all directions x 10 each Resisted walking 5# cables forward, backward x10 Rockerboard hip forward/back x10, rockerboard hip lateral shift x10 Therapeutic Activity: Big forwards walking with arm reach x1 lap Big backwards walking with arm swing 3# x 1 lap Grapevine 3x15'    HOME EXERCISE PROGRAM: Access Code: [email or web address] URL: [email or web address] Date: 09/30/2022 Prepared by: Rudell Cobb  Exercises - Sit to Stand with Arm Swing  - 1-2 x daily - 7 x weekly - 2 sets - 10 reps - Side to Side Weight Shift with Overhead Reach and Counter Support  - 1-2 x daily - 7 x weekly - 2 sets - 10 reps -  Staggered Stance Weight Shift with Arms Reaching  - 1-2 x daily - 7 x weekly - 2 sets - 10 reps - Alternating Step Backward with Support  - 1 x daily - 7 x weekly - 2 sets - 10 reps - Alternating Side Step  - 1-2 x daily - 7 x weekly - 2 sets - 10 reps  GOALS: Goals reviewed with patient? Yes  LONG TERM GOALS: Revised Target date: 12/24/2022   The patient will be indep with HEP progression. Baseline: no HEP Goal status:IN PROGRESS  2.  The patient will improve mini Best test to > or equal to 20/28  Baseline: 19/28 on 11/28 Goal status:IN PROGRESS  3.  The patient will verbalize understanding of community resources for PD including POP and PWR community classes. Baseline: No participation in community  programs -- provided info on 11/28 Goal status: IN PROGRESS  4.  The patient will verbalize 3 tips to reduce freezing during gait. Baseline: Reports festination during gait. Goal status:IN PROGRESS  ASSESSMENT:  CLINICAL IMPRESSION: Treatment session focused on weight shifting in different directions and balance on inclines/declines. Pt with improved turning noted and balance with eyes closed. Plan for d/c next session  PLAN: PT FREQUENCY: 2x/week  PT DURATION: 8 weeks  PLANNED INTERVENTIONS: Therapeutic exercises, Therapeutic activity, Neuromuscular re-education, Balance training, Gait training, Patient/Family education, Self Care, Joint mobilization, and Joint manipulation  PLAN FOR NEXT SESSION:  Large amplitude movements, dynamic gait training, high level balance, postural control. Planning for d/c by end of LTG date.  Bernadette Armijo April Ma L Bristyn Kulesza, PT 12/19/2022, 10:27 AM

## 2022-12-23 ENCOUNTER — Ambulatory Visit: Payer: Medicare Other | Admitting: Physical Therapy

## 2022-12-23 ENCOUNTER — Encounter: Payer: Self-pay | Admitting: Physical Therapy

## 2022-12-23 DIAGNOSIS — R29818 Other symptoms and signs involving the nervous system: Secondary | ICD-10-CM

## 2022-12-23 DIAGNOSIS — E782 Mixed hyperlipidemia: Secondary | ICD-10-CM | POA: Diagnosis not present

## 2022-12-23 DIAGNOSIS — M6281 Muscle weakness (generalized): Secondary | ICD-10-CM | POA: Diagnosis not present

## 2022-12-23 DIAGNOSIS — R2681 Unsteadiness on feet: Secondary | ICD-10-CM

## 2022-12-23 DIAGNOSIS — R2689 Other abnormalities of gait and mobility: Secondary | ICD-10-CM

## 2022-12-23 DIAGNOSIS — I10 Essential (primary) hypertension: Secondary | ICD-10-CM | POA: Diagnosis not present

## 2022-12-23 DIAGNOSIS — E559 Vitamin D deficiency, unspecified: Secondary | ICD-10-CM | POA: Diagnosis not present

## 2022-12-23 NOTE — Therapy (Signed)
OUTPATIENT PHYSICAL THERAPY TREATMENT AND DISCHARGE   Patient Name: Chelsey Estes MRN: [id number] DOB:1941/10/10, 82 y.o., female Today's Date: 12/23/2022  PHYSICAL THERAPY DISCHARGE SUMMARY  Visits from Start of Care: 19  Current functional level related to goals / functional outcomes: See below   Remaining deficits: See below   Education / Equipment: Continuing exercises, online courses, PWR classes.    Patient agrees to discharge. Patient goals were met. Patient is being discharged due to meeting the stated rehab goals.   PCP: Iran Planas, [place] REFERRING PROVIDER: Alonza Bogus, DO   PT End of Session - 12/23/22 1103     Visit Number 19    Date for PT Re-Evaluation 12/24/22    Authorization Type medicare and BCBS federal    Authorization - Visit Number 19    Progress Note Due on Visit 20    PT Start Time 1103    PT Stop Time 1145    PT Time Calculation (min) 42 min    Activity Tolerance Patient tolerated treatment well    Behavior During Therapy WFL for tasks assessed/performed                  Past Medical History:  Diagnosis Date   Anxiety    Arthritis    Depression    Glaucoma    History of kidney stones    7 lithrotripsy   Hyperlipidemia    Hypertension    Parkinson disease    Past Surgical History:  Procedure Laterality Date   ABDOMINAL HYSTERECTOMY     ANTERIOR (CYSTOCELE) AND POSTERIOR REPAIR (RECTOCELE) WITH XENFORM GRAFT AND SACROSPINOUS FIXATION     CATARACT EXTRACTION, BILATERAL     INGUINAL HERNIA REPAIR Left    LUMBAR LAMINECTOMY/DECOMPRESSION MICRODISCECTOMY Right 10/12/2021   Procedure: Right Lumbar four-five Laminectomy for facet/synovial cyst;  Surgeon: Eustace Moore, MD;  Location: [facility];  Service: Neurosurgery;  Laterality: Right;   Patient Active Problem List   Diagnosis Date Noted   Vitamin D deficiency 12/02/2022   Kidney stone 11/22/2022   Tinnitus of left ear 07/03/2022   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 07/03/2022   Decreased GFR 04/17/2022   Medication management 04/16/2022   Synovial cyst of lumbar facet joint 09/19/2021   DDD (degenerative disc disease), lumbar 04/11/2021   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 03/09/2021   Palpitations 09/05/2020   SOB (shortness of breath) 09/05/2020   Chest pain 09/05/2020   Mixed hyperlipidemia 06/06/2020   Primary insomnia 06/06/2020   Primary parkinsonism 10/26/2019   Osteoporosis 09/15/2019   Blister of great toe of left foot 08/19/2019   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   Moderate episode of recurrent major depressive disorder ([place]) 02/09/2019   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 02/09/2019   Shuffling gait 02/09/2019   Balance problems 02/09/2019   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   Orthostatic hypotension 02/01/2019   Dizziness 02/01/2019   Primary osteoarthritis of right foot 09/25/2018   Snoring 09/25/2018   Night terrors, adult 09/25/2018   Multiple falls 09/25/2018   Metatarsalgia of left foot 08/20/2018   Chronic foot pain, right 08/20/2018   Left hip pain 04/10/2017   History of shingles 04/08/2017   Adenomatous polyp 04/08/2017   Pure hypercholesterolemia 04/08/2017   History of kidney stones 04/08/2017   Essential hypertension 04/08/2017   Depression, recurrent ([place]) 04/08/2017   Anxiety 04/08/2017   Greater trochanteric bursitis of left hip 04/08/2017    ONSET  DATE: 09/10/22  REFERRING DIAG: Parkinson's Disease  THERAPY DIAG:  Muscle weakness (generalized)  Other abnormalities of gait and mobility  Unsteadiness on feet  Other symptoms and signs involving the nervous system  Balance disorder  Rationale for Evaluation and Treatment Rehabilitation  SUBJECTIVE:    SUBJECTIVE STATEMENT: Pt states she's been thinking about the Parkinson's classes. Has been worried about going to in person classes.   PERTINENT HISTORY: h/o parkinsonism,    PAIN:  Are you having pain? Yes: NPRS scale: 0/10 Pain location: Back pain Pain description: Sore Aggravating factors: lifting dogs Relieving factors: rest  PATIENT GOALS I want to be able to walk more normally (not shuffle, not bend forward)  OBJECTIVE:   ___________________________________________________________________________________________________________________________ From initial eval unless otherwise stated LOWER EXTREMITY MMT:    MMT Right Eval Left Eval  Hip flexion 3+/5 3+/5  Knee flexion 5/5 5/5  Knee extension 5/5 5/5  Ankle dorsiflexion 5/5 5/5  (Blank rows = not tested)  FUNCTIONAL TESTs: From eval  5 times sit to stand: 17 seconds Timed up and go (TUG): 11 10 meter walk test: 2.86 ft/sec  Mini Best 14 seconds  10/27 5 times sit to stand: 13 sec TUG: 11 sec  11/28 Mini-BESTest 19  12/23/22   [facility] PT Assessment - 12/23/22 0001       Standardized Balance Assessment   Standardized Balance Assessment Timed Up and Go Test      Mini-BESTest   Sit To Stand Normal: Comes to stand without use of hands and stabilizes independently.    Rise to Toes Normal: Stable for 3 s with maximum height.    Stand on one leg (left) Normal: 20 s.    Stand on one leg (right) Moderate: < 20 s   10 sec   Stand on one leg - lowest score 1    Compensatory Stepping Correction - Forward Normal: Recovers independently with a single, large step (second realignement is allowed).    Compensatory Stepping Correction - Backward No step, OR would fall if not caught, OR falls spontaneously.    Compensatory Stepping Correction - Left Lateral Normal: Recovers independently with 1 step (crossover or lateral OK)    Compensatory Stepping Correction - Right Lateral Normal: Recovers independently with 1 step (crossover or lateral OK)    Stepping Corredtion Lateral - lowest score 2    Stance - Feet together, eyes open, firm surface  Normal: 30s    Stance - Feet together, eyes closed,  foam surface  Normal: 30s    Incline - Eyes Closed Normal: Stands independently 30s and aligns with gravity    Change in Gait Speed Normal: Significantly changes walkling speed without imbalance    Walk with head turns - Horizontal Moderate: performs head turns with reduction in gait speed.    Walk with pivot turns Normal: Turns with feet close FAST (< 3 steps) with good balance.    Step over obstacles Normal: Able to step over box with minimal change of gait speed and with good balance.    Timed UP & GO with Dual Task Moderate: Dual Task affects either counting OR walking (>10%) when compared to the TUG without Dual Task.    Mini-BEST total score 23      Timed Up and Go Test   Normal TUG (seconds) 12    Cognitive TUG (seconds) 12    TUG Comments 17 sec with counting down by 6 from 123, 12 sec counting up by 8 from 120  ________________________________________________________________________________________________  [facility] Adult PT Treatment:                                                DATE: 12/23/22 Therapeutic Exercise: Nustep L5 x 5 min LEs/UEs Neuromuscular re-ed: Feet together on foam x30 sec EO and then EC Feet on incline x30 sec EO Therapeutic Activity: Re-assessment see above   [facility] Adult PT Treatment:                                                DATE: 12/19/22 Therapeutic Exercise: Nustep L6 x 5 min UEs/LEs Piriformis stretch sitting 2x30 sec Neuromuscular re-ed: Resisted walking 10# cables forward, L&R, backward x5 each Standing on decline 2x30 sec EO, feet together Standing on decline 2x30 sec EC, feet together Standing on incline 2x30 sec EC, feet together Partial tandem throwing red weighted ball on rebounder x10 Tandem stance throwing red weighted ball on rebounder x 10 Therapeutic Activity: [place] 3x15' Walk with fast pivot turns x10   HOME EXERCISE PROGRAM: Access Code: [email or web address] URL: [email or web address] Date:  09/30/2022 Prepared by: Rudell Cobb  Exercises - Sit to Stand with Arm Swing  - 1-2 x daily - 7 x weekly - 2 sets - 10 reps - Side to Side Weight Shift with Overhead Reach and Counter Support  - 1-2 x daily - 7 x weekly - 2 sets - 10 reps - Staggered Stance Weight Shift with Arms Reaching  - 1-2 x daily - 7 x weekly - 2 sets - 10 reps - Alternating Step Backward with Support  - 1 x daily - 7 x weekly - 2 sets - 10 reps - Alternating Side Step  - 1-2 x daily - 7 x weekly - 2 sets - 10 reps  GOALS: Goals reviewed with patient? Yes  LONG TERM GOALS: Revised Target date: 12/24/2022   The patient will be indep with HEP progression. Baseline: no HEP Goal status: MET 12/23/22  2.  The patient will improve mini Best test to > or equal to 20/28  Baseline: 19/28 on 11/28 23/28 on 12/23/22 Goal status:MET 12/23/22  3.  The patient will verbalize understanding of community resources for PD including POP and PWR community classes. Baseline: No participation in community programs -- provided info on 11/28 Reports understanding of classes and will think about going to [employer] or do online classes 12/23/22 Goal status: MET  4.  The patient will verbalize 3 tips to reduce freezing during gait. Baseline: Reports festination during gait. 12/23/22: Reports improvements in the bedroom -- has been able to side step to compensate Goal status: MET  ASSESSMENT:  CLINICAL IMPRESSION: Treatment session focused on re-checking pt's goals. Encouraged pt to continue to perform exercises after PT d/c and to walk. Pt verbalizes understanding that she has continued deficits with single leg stance and backwards movement/weight shift. Pt demonstrates greatest gains with stepping movements. Pt feels ready for PT d/c at this time. She has met all PT goals at this time.   PLAN: PT FREQUENCY: 2x/week  PT DURATION: 8 weeks  PLANNED INTERVENTIONS: Therapeutic exercises, Therapeutic activity, Neuromuscular  re-education, Balance training, Gait training, Patient/Family education, Self Care, Joint mobilization, and Joint manipulation  PLAN FOR NEXT SESSION:  Large amplitude movements, dynamic gait training, high level balance, postural control. Planning for d/c by end of LTG date.  Selestino Nila April Ma L Ollen Rao, PT 12/23/2022, 11:41 AM

## 2022-12-24 ENCOUNTER — Other Ambulatory Visit: Payer: Self-pay | Admitting: Physician Assistant

## 2022-12-24 LAB — LIPID PANEL W/REFLEX DIRECT LDL
Cholesterol: 166 mg/dL (ref <200)
HDL: 51 mg/dL (ref >=50)
LDL Cholesterol (Calc): 81 mg/dL (calc)
Non-HDL Cholesterol (Calc): 115 mg/dL (calc) (ref <130)
Total CHOL/HDL Ratio: 3.3 (calc) (ref <5.0)
Triglycerides: 261 mg/dL — ABNORMAL HIGH (ref <150)

## 2022-12-24 LAB — COMPLETE METABOLIC PANEL WITH GFR
AG Ratio: 2 (calc) (ref 1.0–2.5)
ALT: 6 U/L (ref 6–29)
AST: 14 U/L (ref 10–35)
Albumin: 4.2 g/dL (ref 3.6–5.1)
Alkaline phosphatase (APISO): 69 U/L (ref 37–153)
BUN/Creatinine Ratio: 22 (calc) (ref 6–22)
BUN: 24 mg/dL (ref 7–25)
CO2: 27 mmol/L (ref 20–32)
Calcium: 10.3 mg/dL (ref 8.6–10.4)
Chloride: 106 mmol/L (ref 98–110)
Creat: 1.1 mg/dL — ABNORMAL HIGH (ref 0.60–0.95)
Globulin: 2.1 g/dL (calc) (ref 1.9–3.7)
Glucose, Bld: 105 mg/dL — ABNORMAL HIGH (ref 65–99)
Potassium: 4.5 mmol/L (ref 3.5–5.3)
Sodium: 142 mmol/L (ref 135–146)
Total Bilirubin: 0.2 mg/dL (ref 0.2–1.2)
Total Protein: 6.3 g/dL (ref 6.1–8.1)
eGFR: 50 mL/min/{1.73_m2} — ABNORMAL LOW (ref >=60)

## 2022-12-24 LAB — VITAMIN D 25 HYDROXY (VIT D DEFICIENCY, FRACTURES): Vit D, 25-Hydroxy: 44 ng/mL (ref 30–100)

## 2022-12-24 MED ORDER — SIMVASTATIN 20 MG PO TABS: 20.0000 mg | ORAL_TABLET | Freq: Every day | ORAL | 3 refills | Status: DC

## 2022-12-24 NOTE — Progress Notes (Signed)
Chelsey Estes,   Vitamin D looks good.  LDL looks great.  TG are elevated. Watch carbs and sugars. Recheck in 6 months.  Kidney stable.  Liver looks good.

## 2023-01-05 ENCOUNTER — Other Ambulatory Visit: Payer: Self-pay | Admitting: Physician Assistant

## 2023-01-05 DIAGNOSIS — F419 Anxiety disorder, unspecified: Secondary | ICD-10-CM

## 2023-01-05 DIAGNOSIS — F339 Major depressive disorder, recurrent, unspecified: Secondary | ICD-10-CM

## 2023-01-07 ENCOUNTER — Other Ambulatory Visit: Payer: Self-pay | Admitting: Neurology

## 2023-01-07 DIAGNOSIS — G20B1 Parkinson's disease with dyskinesia, without mention of fluctuations: Secondary | ICD-10-CM

## 2023-01-07 DIAGNOSIS — G20A1 Parkinson's disease without dyskinesia, without mention of fluctuations: Secondary | ICD-10-CM

## 2023-01-08 DIAGNOSIS — H401112 Primary open-angle glaucoma, right eye, moderate stage: Secondary | ICD-10-CM | POA: Diagnosis not present

## 2023-01-08 DIAGNOSIS — H401121 Primary open-angle glaucoma, left eye, mild stage: Secondary | ICD-10-CM | POA: Diagnosis not present

## 2023-01-20 NOTE — Progress Notes (Unsigned)
Assessment/Plan:   1.  Parkinsons Disease  -Continue carbidopa/levodopa 25/100, 1 tablet 3 times per day.   2.  Diplopia  -Follows with ophthalmology.  Long history of eye surgeries  3.  GAD/depression  -Mostly related to family situation. Husband recently passed away in 2023/06/30.  She was the caregiver for him.    4.  Parkinsons Disease dyskinesia  -mild.  She doesn't notice it much and no med needed  5.  Low back pain with sacral cyst  -Status post surgical intervention and October, 2022 with Dr. Ronnald Ramp     Subjective:   Chelsey Estes was seen today in follow up for Parkinsons disease.  My previous records were reviewed prior to todays visit as well as outside records available to me.  Patient has been to physical therapy since our last visit.  Notes are reviewed.  She has not had falls since our last visit.  No lightheadedness or near syncope.  Current prescribed movement disorder medications:  Carbidopa/levodopa 25/100, 1 tablet 3 times per day.   PREVIOUS MEDICATIONS: Sinemet  ALLERGIES:   Allergies  Allergen Reactions   Trazodone And Nefazodone Nausea Only and Other (See Comments)    Nausea/dizzines/cramps     CURRENT MEDICATIONS:  Outpatient Encounter Medications as of 01/22/2023  Medication Sig   ALPHAGAN P 0.1 % SOLN Place 1 drop into both eyes in the morning and at bedtime.    buPROPion (WELLBUTRIN XL) 300 MG 24 hr tablet TAKE 1 TABLET BY MOUTH DAILY   carbidopa-levodopa (SINEMET IR) 25-100 MG tablet TAKE 1 TABLET BY MOUTH THREE TIMES DAILY   cholecalciferol (VITAMIN D3) 25 MCG (1000 UNIT) tablet Take 1,000 Units by mouth daily.   cyanocobalamin 2000 MCG tablet Take 2,000 mcg by mouth daily.   ibuprofen (ADVIL) 600 MG tablet Take 600 mg by mouth every 6 (six) hours as needed.   lisinopril (ZESTRIL) 5 MG tablet Take 2 tablets in the morning and 1 tablet in the evening.   Menaquinone-7 (VITAMIN K2) 100 MCG CAPS Take 1 tablet by mouth daily.   MYRBETRIQ 25 MG  TB24 tablet Take 25 mg by mouth daily.   pyridoxine (B-6) 100 MG tablet Take 1 tablet by mouth daily.   simvastatin (ZOCOR) 20 MG tablet Take 1 tablet (20 mg total) by mouth at bedtime.   timolol (TIMOPTIC) 0.5 % ophthalmic solution Place 1 drop into both eyes daily.    No facility-administered encounter medications on file as of 01/22/2023.    Objective:   PHYSICAL EXAMINATION:    VITALS:   There were no vitals filed for this visit.  Wt Readings from Last 3 Encounters:  11/25/22 130 lb (59 kg)  09/10/22 129 lb 9.6 oz (58.8 kg)  07/03/22 131 lb (59.4 kg)       GEN:  The patient appears stated age and is in NAD. HEENT:  Normocephalic, atraumatic.  The mucous membranes are moist. The superficial temporal arteries are without ropiness or tenderness. CV:  RRR Lungs:  CTAB Neck/HEME:  There are no carotid bruits bilaterally.  Neurological examination:  Orientation: The patient is alert and oriented x3. Cranial nerves: There is good facial symmetry with mild facial hypomimia. The speech is fluent and clear. Soft palate rises symmetrically and there is no tongue deviation. Hearing is intact to conversational tone. Sensation: Sensation is intact to light touch throughout Motor: Strength is at least antigravity x4.  Movement examination: Tone: There is normal tone in the UE/LE Abnormal movements: there is dyskinesia  in the RLE, overall mild.  This is same as previous Coordination:  There is mild decremation with finger taps on the right Gait and Station: The patient has no difficulty arising out of a deep-seated chair without the use of the hands. The patient drags the R leg with ambulation a bit.  I have reviewed and interpreted the following labs independently    Chemistry      Component Value Date/Time   NA 142 12/23/2022 1436   NA 143 10/31/2021 0941   K 4.5 12/23/2022 1436   CL 106 12/23/2022 1436   CO2 27 12/23/2022 1436   BUN 24 12/23/2022 1436   BUN 20 10/31/2021  0941   CREATININE 1.10 (H) 12/23/2022 1436   GLU 109 09/25/2016 0000      Component Value Date/Time   CALCIUM 10.3 12/23/2022 1436   ALKPHOS 76 10/31/2021 0941   AST 14 12/23/2022 1436   ALT 6 12/23/2022 1436   BILITOT 0.2 12/23/2022 1436   BILITOT 0.4 10/31/2021 0941       Lab Results  Component Value Date   WBC 10.7 10/31/2021   HGB 14.4 10/31/2021   HCT 44.1 10/31/2021   MCV 89 10/31/2021   PLT 399 10/31/2021    Lab Results  Component Value Date   TSH 1.010 09/05/2020     Total time spent on today's visit was *** minutes, including both face-to-face time and nonface-to-face time.  Time included that spent on review of records (prior notes available to me/labs/imaging if pertinent), discussing treatment and goals, answering patient's questions and coordinating care.  Cc:  Donella Stade, PA-C

## 2023-01-22 ENCOUNTER — Encounter: Payer: Self-pay | Admitting: Neurology

## 2023-01-22 ENCOUNTER — Ambulatory Visit (INDEPENDENT_AMBULATORY_CARE_PROVIDER_SITE_OTHER): Payer: Medicare Other | Admitting: Neurology

## 2023-01-22 VITALS — BP 130/82 | HR 71 | Resp 18 | Ht 60.0 in | Wt 127.0 lb

## 2023-01-22 DIAGNOSIS — G20B1 Parkinson's disease with dyskinesia, without mention of fluctuations: Secondary | ICD-10-CM

## 2023-01-22 NOTE — Patient Instructions (Addendum)
As we discussed, it used to be thought that levodopa would increase risk of melanoma but now it is believed that Parkinsons itself likely increases risk of melanoma. I recommend yearly skin checks with a board certified dermatologist.  You can call [facility] or [facility] for an appointment.  [facility] Address: [place], [place], [place] [address] Phone: [phone]  [facility] [place], [place], [place] Phone: [phone]   Local and Online Resources for Power over Parkinson's Group  February 2024   [place] over Parkinson's Group:    Power Over Parkinson's Patient Education Group will be Wednesday, February 14th-*Hybrid meting*- in person at [facility] location and via [facility], 2:00-3:00 pm.   Starting in November, Power over [employer] and Care Partner Groups will meet together, with plans for separate break out session for caregivers (*this will be evolving over the next few months) Upcoming Power over Parkinson's Meetings/Care Partner Support:  2nd Wednesdays of the month at 2 pm:  February 14th, March 13th  [place] at [email or web address] if interested in participating in this group    [place]! Moves Classes in [place]!  Starting January 09, 2023.  Thursdays, January 25-February 20, 2023 at 11:45 am.  [facility].  Free to participate (through grant with [employer]) and registration required.  Please contact Corwin Levins at [email or web address] to register. Let's Try Pickleball-$25 for 6 weeks of Pickleball, starting February 2nd.  Contact Corwin Levins for more details.  [email or web address] Parkinson's Community Game Night at [facility], Date TBA in Late February, email Sarah for details [email or web address] THRIVE,  an exercise group for early-onset Parkinson's Disease will resume meeting in February, email Sarah for details [email or web address] Parkinson's CarePartner Group for Men is in the works, if interested email [email or web address] ACT FITNESS Chair Yoga classes "Train and Gain", Fridays 10 am, [employer].  Contact Gina at [phone].  PWR! Moves Dynegy Instructor-Led Classes offering at [employer]!  TUESDAYS and Wednesdays 1-2 pm.   Contact Vonna Kotyk at  [email or web address]  or [phone] (Tuesday classes are modified for chair and standing only) Drumming for Parkinson's will be held on 2nd and 4th Mondays at 11:00 am.   Located at the [place] ([employer]  Contact Doylene Canning at [email or web address] or [phone]  Dance for Parkinson 's classes will be on Tuesdays 9:30am-10:30am starting back in February. Located in the [employer] , in the first floor of the [employer] ([place] To register:  [email or web address] or [phone] [facility] Parkinson's Tai Chi Class, Mondays at 11 am.  Call [phone] for details SAVE THE DATE and REGISTER:  [place] Chapter of [place]:  [employer].  Conversations about Parkinson's.  Saturday, February 15, 2023, 9:00 am-2:00 pm.  [place], *In person or online via [place]*.  Register at [email or web address] or call Beverlee Nims at [phone].  [place]:  [email or web address]  PD Health at Home continues:  Mindfulness Mondays, Wellness Wednesdays, Fitness Fridays   Upcoming Education:   Parkinson's 101.  Wednesday, February 7th, 1-2 pm The Changing Landscape of Intimacy.  Wednesday, February 14th, 1-2 pm New to Parkinson's:  Care Partner Basics.  Wednesday, February 28th, 1-2 pm Expert Briefing:  Understanding Pain in  Parkinson's.   Wednesday, April 10th,  1-2  pm  Register for virtual education and expert briefings (webinars) at [email or web address] Please check out their website to sign up for emails and see their full online offerings      [place]:  [email or web address]   Third Thursday Webinars:  On the third Thursday of every month at 12 p.m. ET, join our free live webinars to learn about various aspects of living with Parkinson's disease and our work to speed medical breakthroughs.  Upcoming Webinar:  Therapies for Tomorrow:  How Better Clinical Trial Design Leads to Better Treatments.  Thursday, February 15th at 12 noon. Check out additional information on their website to see their full online offerings    [facility]:  [email or web address]  Upcoming Webinar:   [place].  Thursday, February 8th, 12 noon Webinar Series:  Living with Parkinson's Meetup.   Third Thursdays each month, 3 pm  Care Partner Monthly Meetup.  With Robin Searing [place].  First Tuesday of each month, 2 pm  Check out additional information to Live Well Today on their website    Parkinson and Movement Disorders (PMD) Alliance:  www.[email or web address]  [employer] Online:  Online Education Events  Sign up for emails, which are sent weekly to give you updates on programming and online offerings    Parkinson's Association of the [place]:  www.[email or web address]  Information on online support groups, education events, and online exercises including Yoga, Parkinson's exercises and more-LOTS of information on links to PD resources and online events  Virtual Support Group through Parkinson's Association of the [place]; next one is scheduled for Wednesday, Feb 7th  MOVEMENT AND EXERCISE OPPORTUNITIES  PWR! Moves Classes at [place].  Wednesdays 10 and 11 am.   Contact Amy Marriott, PT  [email or web address] if interested.  PWR! Moves Class offerings at [employer]. *TUESDAYS* and Wednesdays 1-2 pm.    Contact Vonna Kotyk at  [email or web address]    Parkinson's Wellness Recovery (PWR! Moves)  www.[email or web address]  Info on the PWR! Virtual Experience:  You will have access to our expertise?through self-assessment, guided plans that start with the PD-specific fundamentals, educational content, tips, Q&A with an expert, and a growing Art therapist of PD-specific pre-recorded and live exercise classes of varying types and intensity - both physical and cognitive! If that is not enough, we offer 1:1 wellness consultations (in-person or virtual) to personalize your PWR! Research scientist (medical).   [place] Fridays:   As part of the PD Health @ Home program, this free video series focuses each week on one aspect of fitness designed to support people living with Parkinson's.? These weekly videos highlight the [place] fitness guidelines for people with Parkinson's disease.  ModemGamers.si  Dance for PD website is offering free, live-stream classes throughout the week, as well as links to [employer] of classes:  [email or web address]  Virtual dance and Pilates for Parkinson's classes: Click on the Community Tab> Parkinson's Movement Initiative Tab.  To register for classes and for more information, visit [email or web address] and click the "community" tab.   [employer] Parkinson's Cycling Classes   [employer]:  Thursdays @ Noon-Live classes at [employer] ([employer] [facility] [email or web address]?or [phone])  [employer]: Virtual Classes Mondays and Thursdays Jeanette Caprice classes Tuesday, Wednesday and Thursday (contact Lindsay at [email or web address] ?or [phone])  [place]  Varied levels of classes are offered Tuesdays and Thursdays  at [employer].   Stretching with Verdis Frederickson weekly class is also offered for people with Parkinson's  To observe a  class or for more information, call [phone] or email Hezzie Bump at [email or web address]   ADDITIONAL SUPPORT AND RESOURCES  Well[employer]Spring [employer]:Online Caregiver Education Opportunities:  www.well-springsolutions.org/caregiver-education/caregiver-support-group.  You may also contact Vickki Muff at [email or web address] or [phone].     Family Caregiver [phone].  Thursday, March 7th, 10:15-1:45 at [facility]  Register with [facility] (see above) Well-Spring Navigator:  Just1Navigator program, a?free service to help individuals and families through the journey of determining care for older adults.  The "Navigator" is a Vickki Muff, Education officer, museum, who will speak with a prospective client and/or loved ones to provide an assessment of the situation and a set of recommendations for a personalized care plan -- all free of charge, and whether?Well-Spring [employer] offers the needed service or not. If the need is not a service we provide, we are well-connected with reputable programs in town that we can refer you to.  www.well-springsolutions.org or to speak with the Navigator, call [phone].

## 2023-02-11 ENCOUNTER — Ambulatory Visit: Payer: Medicare Other | Admitting: Neurology

## 2023-02-20 DIAGNOSIS — H401121 Primary open-angle glaucoma, left eye, mild stage: Secondary | ICD-10-CM | POA: Diagnosis not present

## 2023-02-20 DIAGNOSIS — H401112 Primary open-angle glaucoma, right eye, moderate stage: Secondary | ICD-10-CM | POA: Diagnosis not present

## 2023-02-20 DIAGNOSIS — H5319 Other subjective visual disturbances: Secondary | ICD-10-CM | POA: Diagnosis not present

## 2023-02-24 ENCOUNTER — Ambulatory Visit (INDEPENDENT_AMBULATORY_CARE_PROVIDER_SITE_OTHER): Payer: Medicare Other | Admitting: Physician Assistant

## 2023-02-24 VITALS — BP 130/70 | HR 86 | Ht 60.0 in | Wt 124.0 lb

## 2023-02-24 DIAGNOSIS — I10 Essential (primary) hypertension: Secondary | ICD-10-CM

## 2023-02-24 DIAGNOSIS — F339 Major depressive disorder, recurrent, unspecified: Secondary | ICD-10-CM | POA: Diagnosis not present

## 2023-02-24 DIAGNOSIS — F419 Anxiety disorder, unspecified: Secondary | ICD-10-CM | POA: Diagnosis not present

## 2023-02-24 MED ORDER — LISINOPRIL 5 MG PO TABS: ORAL_TABLET | ORAL | 1 refills | Status: DC

## 2023-02-24 MED ORDER — BUPROPION HCL ER (XL) 300 MG PO TB24: 300.0000 mg | ORAL_TABLET | Freq: Every day | ORAL | 1 refills | Status: DC

## 2023-02-24 NOTE — Progress Notes (Signed)
Home blood pressure readings: 02/24/2023 132/77 02/21/2023 125/74  02/20/2023 125/66 02/18/2023 134/70 02/15/2023 119/66 02/11/2023 126/79 02/07/2023 140/73 01/28/2023 152/78 01/25/2023 143/83 01/21/2023 135/74 01/16/2023 127/77

## 2023-02-24 NOTE — Progress Notes (Signed)
Established Patient Office Visit  Subjective   Patient ID: Chelsey Estes, female    DOB: [birth year]  Age: 82 y.o. MRN: [id number]  Chief Complaint  Patient presents with   Follow-up   Hypertension    HPI Pt is a 82 yo female with HTN, Parkinsonism, MDD who presents to the clinic for follow up on BP.   Pt brings in BP log from home 120s to 140s over 70-80s. She denies any problems with CP, headaches, dizziness. She has had some palpitations occasionally when she lays down on right side. They resolve quickly.   Her mood is good. She is staying busy with her dogs.   No recent falls.   .. Active Ambulatory Problems    Diagnosis Date Noted   History of shingles 04/08/2017   Adenomatous polyp 04/08/2017   Pure hypercholesterolemia 04/08/2017   History of kidney stones 04/08/2017   Essential hypertension 04/08/2017   Depression, recurrent ([place]) 04/08/2017   Anxiety 04/08/2017   Greater trochanteric bursitis of left hip 04/08/2017   Left hip pain 04/10/2017   Metatarsalgia of left foot 08/20/2018   Chronic foot pain, right 08/20/2018   Primary osteoarthritis of right foot 09/25/2018   Snoring 09/25/2018   Night terrors, adult 09/25/2018   Multiple falls 09/25/2018   Orthostatic hypotension 02/01/2019   Dizziness 02/01/2019   Moderate episode of recurrent major depressive disorder ([place]) 02/09/2019   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 02/09/2019   Shuffling gait 02/09/2019   Balance problems 02/09/2019   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   Blister of great toe of left foot 08/19/2019   Osteoporosis 09/15/2019   Primary parkinsonism 10/26/2019   Mixed hyperlipidemia 06/06/2020   Primary insomnia 06/06/2020   Palpitations 09/05/2020   SOB (shortness of breath) 09/05/2020   Chest pain 09/05/2020   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 03/09/2021   DDD (degenerative disc disease), lumbar 04/11/2021   Synovial  cyst of lumbar facet joint 09/19/2021   Medication management 04/16/2022   Decreased GFR 04/17/2022   Tinnitus of left ear 07/03/2022   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    Objective:     BP 130/70   Pulse 86   Ht 5' (1.524 m)   Wt 124 lb (56.2 kg)   SpO2 100%   BMI 24.22 kg/m  BP Readings from Last 3 Encounters:  02/24/23 130/70  01/22/23 130/82  11/25/22 (!) 148/88   Wt Readings from Last 3 Encounters:  02/24/23 124 lb (56.2 kg)  01/22/23 127 lb (57.6 kg)  11/25/22 130 lb (59 kg)    ..    02/24/2023   11:15 AM 11/25/2022    2:55 PM 10/28/2022    8:03 AM 04/17/2022    9:20 AM 10/24/2021    8:09 AM  Depression screen PHQ 2/9  Decreased Interest 1 1 0 0 1  Down, Depressed, Hopeless 1 1 0 1 1  PHQ - 2 Score 2 2 0 1 2  Altered sleeping 1 2  0 3  Tired, decreased energy '1 1  1 1  '$ Change in appetite 1 1  0 1  Feeling bad or failure about yourself  0 0  0 1  Trouble concentrating 0 0  0 1  Moving slowly or fidgety/restless 0 1  0 3  Suicidal thoughts 0 0  0 0  PHQ-9 Score '5 7  2 12  '$ Difficult doing work/chores Somewhat difficult Somewhat difficult  Not difficult at all Somewhat difficult   ..    02/24/2023   11:16 AM 11/25/2022    2:55 PM 04/17/2022    9:20 AM 04/09/2021   11:21 AM  GAD 7 : Generalized Anxiety Score  Nervous, Anxious, on Edge '1 1 1 3  '$ Control/stop worrying 1 1 0 3  Worry too much - different things 0 '1 1 3  '$ Trouble relaxing 0 1 0 3  Restless 0 0 0 3  Easily annoyed or irritable 0 0 0 1  Afraid - awful might happen 0 0 0 1  Total GAD 7 Score '2 4 2 17  '$ Anxiety Difficulty Somewhat  difficult Somewhat difficult Not difficult at all Very difficult      Physical Exam Constitutional:      Appearance: Normal appearance.  HENT:     Head: Normocephalic.  Cardiovascular:     Rate and Rhythm: Normal rate and regular rhythm.  Pulmonary:     Effort: Pulmonary effort is normal.     Breath sounds: Normal breath sounds.  Neurological:     General: No focal deficit present.     Mental Status: She is alert.  Psychiatric:        Mood and Affect: Mood normal.          Assessment & Plan:  Marland KitchenMarland KitchenBriell was seen today for follow-up and hypertension.  Diagnoses and all orders for this visit:  Essential hypertension -     lisinopril (ZESTRIL) 5 MG tablet; Take 2 tablets in the morning and 1 tablet in the evening.  Anxiety -     buPROPion (WELLBUTRIN XL) 300 MG 24 hr tablet; Take 1 tablet (300 mg total) by mouth daily.  Depression, recurrent (HCC) -     buPROPion (WELLBUTRIN XL) 300 MG 24 hr tablet; Take 1 tablet (300 mg total) by mouth daily.   PHQ/GAD numbers good. Refilled wellbutrin Vitals look great continue with lisinopril at current dosage Follow up in 6 months.    Iran Planas, PA-C

## 2023-02-28 DIAGNOSIS — H43813 Vitreous degeneration, bilateral: Secondary | ICD-10-CM | POA: Diagnosis not present

## 2023-02-28 DIAGNOSIS — H35033 Hypertensive retinopathy, bilateral: Secondary | ICD-10-CM | POA: Diagnosis not present

## 2023-02-28 DIAGNOSIS — H353132 Nonexudative age-related macular degeneration, bilateral, intermediate dry stage: Secondary | ICD-10-CM | POA: Diagnosis not present

## 2023-02-28 DIAGNOSIS — H348322 Tributary (branch) retinal vein occlusion, left eye, stable: Secondary | ICD-10-CM | POA: Diagnosis not present

## 2023-03-07 ENCOUNTER — Encounter: Payer: Self-pay | Admitting: Physician Assistant

## 2023-03-10 MED ORDER — BELSOMRA 10 MG PO TABS: 1.0000 | ORAL_TABLET | Freq: Every day | ORAL | 1 refills | Status: DC

## 2023-03-10 NOTE — Addendum Note (Signed)
Addended by: Donella Stade on: 03/10/2023 04:47 PM   Modules accepted: Orders

## 2023-03-25 MED ORDER — BELSOMRA 10 MG PO TABS: 1.0000 | ORAL_TABLET | Freq: Every day | ORAL | 3 refills | Status: DC

## 2023-03-25 MED ORDER — BELSOMRA 10 MG PO TABS: 1.0000 | ORAL_TABLET | Freq: Every day | ORAL | 0 refills | Status: DC

## 2023-03-25 NOTE — Addendum Note (Signed)
Addended by: Jomarie Longs on: 03/25/2023 07:54 AM   Modules accepted: Orders

## 2023-03-28 ENCOUNTER — Telehealth: Payer: Self-pay

## 2023-03-28 NOTE — Telephone Encounter (Signed)
I retrieved a vm from [facility]. Pharmacy Tech-Cecily,  requesting the [employer] ([place]) DEA# in order for refill to be sent. That number has been provided and refill will be sent delivered to pt.

## 2023-04-03 ENCOUNTER — Other Ambulatory Visit: Payer: Self-pay | Admitting: Neurology

## 2023-04-03 DIAGNOSIS — G20A1 Parkinson's disease without dyskinesia, without mention of fluctuations: Secondary | ICD-10-CM

## 2023-04-03 DIAGNOSIS — G20B1 Parkinson's disease with dyskinesia, without mention of fluctuations: Secondary | ICD-10-CM

## 2023-04-04 DIAGNOSIS — H35033 Hypertensive retinopathy, bilateral: Secondary | ICD-10-CM | POA: Diagnosis not present

## 2023-04-04 DIAGNOSIS — H43813 Vitreous degeneration, bilateral: Secondary | ICD-10-CM | POA: Diagnosis not present

## 2023-04-04 DIAGNOSIS — H348322 Tributary (branch) retinal vein occlusion, left eye, stable: Secondary | ICD-10-CM | POA: Diagnosis not present

## 2023-04-04 DIAGNOSIS — H353132 Nonexudative age-related macular degeneration, bilateral, intermediate dry stage: Secondary | ICD-10-CM | POA: Diagnosis not present

## 2023-06-05 DIAGNOSIS — H43813 Vitreous degeneration, bilateral: Secondary | ICD-10-CM | POA: Diagnosis not present

## 2023-06-05 DIAGNOSIS — H348322 Tributary (branch) retinal vein occlusion, left eye, stable: Secondary | ICD-10-CM | POA: Diagnosis not present

## 2023-06-05 DIAGNOSIS — H353132 Nonexudative age-related macular degeneration, bilateral, intermediate dry stage: Secondary | ICD-10-CM | POA: Diagnosis not present

## 2023-06-05 DIAGNOSIS — H35033 Hypertensive retinopathy, bilateral: Secondary | ICD-10-CM | POA: Diagnosis not present

## 2023-07-07 ENCOUNTER — Other Ambulatory Visit: Payer: Self-pay | Admitting: Physician Assistant

## 2023-07-07 DIAGNOSIS — Z1231 Encounter for screening mammogram for malignant neoplasm of breast: Secondary | ICD-10-CM

## 2023-07-14 ENCOUNTER — Encounter: Payer: Self-pay | Admitting: Physician Assistant

## 2023-07-14 ENCOUNTER — Other Ambulatory Visit: Payer: Self-pay | Admitting: Physician Assistant

## 2023-07-14 ENCOUNTER — Ambulatory Visit (INDEPENDENT_AMBULATORY_CARE_PROVIDER_SITE_OTHER): Payer: Medicare Other | Admitting: Physician Assistant

## 2023-07-14 VITALS — BP 115/68 | HR 74 | Ht 60.0 in | Wt 124.0 lb

## 2023-07-14 DIAGNOSIS — E876 Hypokalemia: Secondary | ICD-10-CM | POA: Diagnosis not present

## 2023-07-14 DIAGNOSIS — Z8639 Personal history of other endocrine, nutritional and metabolic disease: Secondary | ICD-10-CM | POA: Diagnosis not present

## 2023-07-14 DIAGNOSIS — R002 Palpitations: Secondary | ICD-10-CM | POA: Diagnosis not present

## 2023-07-14 DIAGNOSIS — G20C Parkinsonism, unspecified: Secondary | ICD-10-CM

## 2023-07-14 DIAGNOSIS — I10 Essential (primary) hypertension: Secondary | ICD-10-CM | POA: Diagnosis not present

## 2023-07-14 NOTE — Progress Notes (Unsigned)
Acute Office Visit  Subjective:     Patient ID: Chelsey Estes, female    DOB: Feb 19, 1941, 82 y.o.   MRN: [id number]  No chief complaint on file.   HPI Patient is in today for palpitations for the last few days that seem to be worse when she lays down. She would feel flutters for a few seconds then nothing intermittently. No chest pain, cough, SOB, swelling of legs. She has had no recent medication changes except that she did start taking potassium OTC. She stopped with the palpitations but they have continued. She took potassium OTC for a long time but stopped 9 months ago. She does not drink any caffeine or alcohol. Pt admits that she does not drink a lot of water daily. No dizziness of syncope reported.     Active Ambulatory Problems    Diagnosis Date Noted   History of shingles 04/08/2017   Adenomatous polyp 04/08/2017   Pure hypercholesterolemia 04/08/2017   History of kidney stones 04/08/2017   Essential hypertension 04/08/2017   Depression, recurrent ([facility]) 04/08/2017   Anxiety 04/08/2017   Greater trochanteric bursitis of left hip 04/08/2017   Left hip pain 04/10/2017   Metatarsalgia of left foot 08/20/2018   Chronic foot pain, right 08/20/2018   Primary osteoarthritis of right foot 09/25/2018   Snoring 09/25/2018   Night terrors, adult 09/25/2018   Multiple falls 09/25/2018   Orthostatic hypotension 02/01/2019   Dizziness 02/01/2019   Moderate episode of recurrent major depressive disorder (HCC) 02/09/2019   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 02/09/2019   Shuffling gait 02/09/2019   Balance problems 02/09/2019   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   Blister of great toe of left foot 08/19/2019   Osteoporosis 09/15/2019   Primary parkinsonism 10/26/2019   Mixed hyperlipidemia 06/06/2020   Primary insomnia 06/06/2020   Palpitations 09/05/2020   SOB (shortness of breath) 09/05/2020   Chest pain 09/05/2020   Right lumbar  radiculitis secondary to facet synovial cyst 03/09/2021   DDD (degenerative disc disease), lumbar 04/11/2021   Synovial cyst of lumbar facet joint 09/19/2021   Medication management 04/16/2022   Decreased GFR 04/17/2022   Tinnitus of left ear 07/03/2022   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     Objective:    BP 115/68   Pulse 74   Ht 5' (1.524 m)   Wt 124 lb 0.6 oz (56.3 kg)   SpO2 98%   BMI 24.22 kg/m  BP Readings from Last 3 Encounters:  07/14/23 115/68  02/24/23 130/70  01/22/23 130/82   Wt Readings from Last 3 Encounters:  07/14/23 124 lb 0.6 oz (56.3 kg)  02/24/23 124 lb (56.2 kg)  01/22/23 127 lb (57.6 kg)      Physical Exam Vitals reviewed.  Constitutional:      Appearance: Normal appearance.  HENT:     Head: Normocephalic.  Cardiovascular:     Rate and Rhythm: Normal rate and regular rhythm.     Pulses: Normal pulses.  Pulmonary:  Effort: Pulmonary effort is normal.  Musculoskeletal:     Right lower leg: No edema.     Left lower leg: No edema.  Neurological:     General: No focal deficit present.     Mental Status: She is alert and oriented to person, place, and time.  Psychiatric:        Mood and Affect: Mood normal.     EKG: NSR with low voltage and almost absent twave in anterior and V1/V2 leads. No significant change since 2022. No ST elevation or depression.      Assessment & Plan:  Marland KitchenMarland KitchenDiagnoses and all orders for this visit:  Palpitations -     CBC w/Diff/Platelet -     CMP14+EGFR -     TSH -     Fe+TIBC+Fer -     EKG 12-Lead  History of hypokalemia -     CBC  w/Diff/Platelet -     CMP14+EGFR -     TSH -     Fe+TIBC+Fer  Essential hypertension -     CBC w/Diff/Platelet -     CMP14+EGFR -     TSH -     Fe+TIBC+Fer  Primary parkinsonism   Unclear etiology of palpitations EKG-NSR with no real changes since 2022.  Will get labs to look for electrolyte abnormalities Recheck kidney, liver, thyroid HO to go over other causes of palpitations such as dehydration and lack of sleep Pt is sleeping well but admits to not drinking a lot of water Instructed to increase hydration Will consider heart monitor if continues BP looks great today Follow up in 1 month or as needed or symptoms worsen    Tandy Gaw, PA-C

## 2023-07-14 NOTE — Patient Instructions (Signed)
EKG unchanged today Will get labs today  Premature Ventricular Contraction  A premature ventricular contraction (PVC) is a type of irregular heartbeat (arrhythmia). The heart has four chambers, including the upper chambers (atria) and lower chambers (ventricles). Normally, an electrical signal starts in a group of cells called the sinoatrial node (SA node) and travels through the atria, causing them to pump blood into the ventricles. During a PVC, the heartbeat starts in one of the lower ventricles. This may cause the heartbeat to be shorter and less effective. In most cases, PVCs come and go and do not require treatment. What are the causes? Common causes of the condition include: Heart attack or coronary artery disease (CAD). Heart valve problems. Heart surgery. Infection of the heart (myocarditis). Inflammation of the heart. In many cases, the cause of this condition is not known. What increases the risk? The following factors may make you more likely to develop this condition: Age, especially being over age 11. Being female. An imbalance of salts and minerals in the body (electrolytes). Low blood oxygen levels or high carbon dioxide levels. Certain medicines, including over-the-counter and prescribed medicines. High blood pressure. Obesity. Episodes may be triggered by: Vigorous exercise. Tobacco, alcohol, or caffeine use. Illegal drug use. Emotional stress. Poor or irregular sleep. What are the signs or symptoms? The main symptoms of this condition are fast or irregular heartbeats (palpitations) or the feeling of a pause in the heartbeat. Other symptoms include: Shortness of breath. Difficulty exercising. Chest pain. Feeling tired. Dizziness. In some cases, there are no symptoms. How is this diagnosed? This condition may be diagnosed based on: Your medical history or symptoms. A physical exam. Your health care provider may listen to your heart. Tests, such as: Blood  tests. An ECG (electrocardiogram) to monitor the electrical activity of your heart. An ambulatory cardiac monitor that records your heartbeats for 24 hours or more. Stress tests to see how exercise affects your heart rhythm and blood supply. An echocardiogram, which creates an image of your heart. An electrophysiology study (EPS) to check for electrical problems in your heart. How is this treated? Treatment for this condition depends on any underlying conditions, the type of PVC, how many PVCs you have had, and if the symptoms are affecting your daily life. Possible treatments include: Avoiding things that cause PVCs (triggers). These include caffeine, tobacco, and alcohol. Taking medicines if symptoms are severe or if the arrhythmias happen a lot. Getting treatment for underlying conditions that cause PVCs. Having an implantable cardioverter defibrillator (ICD) placed in the chest to monitor the heartbeat. The monitor sends a shock to the heart if it senses an arrhythmia and brings the heartbeat back to normal. Having a catheter ablation procedure to destroy the part of the heart tissue that sends abnormal signals. In many cases, no treatment is required. Follow these instructions at home: Lifestyle  Do not use any products that contain nicotine or tobacco. These products include cigarettes, chewing tobacco, and vaping devices, such as e-cigarettes. If you need help quitting, ask your health care provider. Do not use illegal drugs. Exercise regularly. Ask your health care provider what type of exercise is safe for you. Try to get at least 7-9 hours of sleep each night. Find healthy ways to manage stress. Avoid stressful situations when possible. Alcohol use Do not drink alcohol if: Your health care provider tells you not to drink. You are pregnant, may be pregnant, or are planning to become pregnant. Alcohol triggers your episodes. If you drink  alcohol: Limit how much you have to: 0-1  drink a day for women. 0-2 drinks a day for men. Know how much alcohol is in your drink. In the [place] one drink equals one 12 oz bottle of beer (355 mL), one 5 oz glass of wine (148 mL), or one 1 oz glass of hard liquor (44 mL). General instructions Take over-the-counter and prescription medicines only as told by your health care provider. If caffeine triggers episodes of PVC, do not eat, drink, or use anything with caffeine in it. Contact a health care provider if: You feel palpitations often. You have nausea and vomiting. Get help right away if: You have chest pain. You have trouble breathing. You start sweating for no reason. You become light-headed or you faint. These symptoms may be an emergency. Get help right away. Call 911. Do not wait to see if the symptoms will go away. Do not drive yourself to the hospital. This information is not intended to replace advice given to you by your health care provider. Make sure you discuss any questions you have with your health care provider. Document Revised: 05/03/2022 Document Reviewed: 05/03/2022 Elsevier Patient Education  2024 [employer].

## 2023-07-15 NOTE — Progress Notes (Signed)
I also ordered CMP and CBC but I do not see it resulted?

## 2023-07-21 ENCOUNTER — Telehealth: Payer: Self-pay

## 2023-07-21 NOTE — Telephone Encounter (Signed)
Chelsey Estes reviewed  CBC and CMP from [place]   States shows  No anemia,  Potassium looks good  No concerns with labs   Patient informed as above.

## 2023-07-31 ENCOUNTER — Ambulatory Visit: Payer: Medicare Other | Admitting: Neurology

## 2023-07-31 DIAGNOSIS — N200 Calculus of kidney: Secondary | ICD-10-CM | POA: Diagnosis not present

## 2023-08-06 ENCOUNTER — Ambulatory Visit (INDEPENDENT_AMBULATORY_CARE_PROVIDER_SITE_OTHER): Payer: Medicare Other

## 2023-08-06 DIAGNOSIS — Z1231 Encounter for screening mammogram for malignant neoplasm of breast: Secondary | ICD-10-CM

## 2023-08-07 NOTE — Progress Notes (Signed)
Normal mammogram. Follow up in 1 year.

## 2023-08-17 ENCOUNTER — Other Ambulatory Visit: Payer: Self-pay | Admitting: Physician Assistant

## 2023-08-17 DIAGNOSIS — I10 Essential (primary) hypertension: Secondary | ICD-10-CM

## 2023-08-17 DIAGNOSIS — F339 Major depressive disorder, recurrent, unspecified: Secondary | ICD-10-CM

## 2023-08-17 DIAGNOSIS — F419 Anxiety disorder, unspecified: Secondary | ICD-10-CM

## 2023-08-27 ENCOUNTER — Encounter: Payer: Self-pay | Admitting: Physician Assistant

## 2023-08-27 ENCOUNTER — Ambulatory Visit (INDEPENDENT_AMBULATORY_CARE_PROVIDER_SITE_OTHER): Payer: Medicare Other | Admitting: Physician Assistant

## 2023-08-27 VITALS — BP 92/63 | HR 72 | Ht 60.0 in | Wt 121.0 lb

## 2023-08-27 DIAGNOSIS — I10 Essential (primary) hypertension: Secondary | ICD-10-CM | POA: Diagnosis not present

## 2023-08-27 DIAGNOSIS — Z23 Encounter for immunization: Secondary | ICD-10-CM

## 2023-08-27 DIAGNOSIS — I952 Hypotension due to drugs: Secondary | ICD-10-CM

## 2023-08-27 DIAGNOSIS — M7062 Trochanteric bursitis, left hip: Secondary | ICD-10-CM | POA: Diagnosis not present

## 2023-08-27 MED ORDER — LISINOPRIL 5 MG PO TABS
ORAL_TABLET | ORAL | Status: DC
Start: 2023-08-27 — End: 2023-09-10

## 2023-08-27 NOTE — Progress Notes (Signed)
   Established Patient Office Visit  Subjective   Patient ID: Chelsey Estes, female    DOB: 1941-06-05  Age: 82 y.o. MRN: [id number]  Chief Complaint  Patient presents with   Dysuria    HPI  Scolosis therapy    ROS    Objective:     BP (!) 137/56   Pulse 61   Ht 5' (1.524 m)   Wt 201 lb (91.2 kg)   SpO2 99%   BMI 39.26 kg/m    Physical Exam       The ASCVD Risk score (Arnett DK, et al., 2019) failed to calculate for the following reasons:   The 2019 ASCVD risk score is only valid for ages 37 to 82    Assessment & Plan:   Problem List Items Addressed This Visit       Unprioritized   Essential hypertension   Other Visit Diagnoses     Immunization due    -  Primary   Relevant Orders   Flu Vaccine Trivalent High Dose (Fluad)       No follow-ups on file.    Tandy Gaw, PA-C

## 2023-08-27 NOTE — Patient Instructions (Signed)
Decrease lisinopril to 1 tablet daily.

## 2023-08-27 NOTE — Progress Notes (Signed)
Established Patient Office Visit  Subjective   Patient ID: Chelsey Estes, female    DOB: 14-Nov-1941  Age: 82 y.o. MRN: [id number]   HPI  Patient is in today for follow-up on hypertension, palpitations, and trochanteric bursitis. Patient states that her blood pressures at home have been averaging around 115/60. Last episode of palpitations occurred two weeks ago on the night before her dental procedure. She denies any recent palpitations, SOB, chest pain, trouble breathing while lying down, headache, dizziness, or cough. Patient does state that she hasn't been eating or drinking as much due to recent dental procedure that is still healing. Patient denies any alcohol or caffeine use.   Patient also states that she feels her bursitis of left hip is affecting her ability to exercise.   She reports one fall in the past year that occurred while she was carrying her dog to her car. She reports no injuries associated with the fall.   . Active Ambulatory Problems    Diagnosis Date Noted   History of shingles 04/08/2017   Adenomatous polyp 04/08/2017   Pure hypercholesterolemia 04/08/2017   History of kidney stones 04/08/2017   Essential hypertension 04/08/2017   Depression, recurrent (HCC) 04/08/2017   Anxiety 04/08/2017   Greater trochanteric bursitis of left hip 04/08/2017   Left hip pain 04/10/2017   Metatarsalgia of left foot 08/20/2018   Chronic foot pain, right 08/20/2018   Primary osteoarthritis of right foot 09/25/2018   Snoring 09/25/2018   Night terrors, adult 09/25/2018   Multiple falls 09/25/2018   Orthostatic hypotension 02/01/2019   Dizziness 02/01/2019   Moderate episode of recurrent major depressive disorder (HCC) 02/09/2019   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 02/09/2019   Shuffling gait 02/09/2019   Balance problems 02/09/2019   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   Blister of great toe of left foot 08/19/2019   Osteoporosis  09/15/2019   Primary parkinsonism 10/26/2019   Mixed hyperlipidemia 06/06/2020   Primary insomnia 06/06/2020   Palpitations 09/05/2020   SOB (shortness of breath) 09/05/2020   Chest pain 09/05/2020   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 03/09/2021   DDD (degenerative disc disease), lumbar 04/11/2021   Synovial cyst of lumbar facet joint 09/19/2021   Medication management 04/16/2022   Decreased GFR 04/17/2022   Tinnitus of left ear 07/03/2022   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    Objective:     BP 92/63   Pulse 72   Ht 5' (1.524 m)   Wt 121 lb (54.9 kg)   SpO2 99%   BMI 23.63 kg/m  BP Readings from Last 3 Encounters:  08/27/23 92/63  07/14/23 115/68  02/24/23 130/70   Wt Readings from Last 3 Encounters:  08/27/23 121 lb (54.9 kg)  07/14/23 124 lb 0.6 oz (56.3 kg)  02/24/23 124 lb (56.2 kg)      Physical Exam Constitutional:      Appearance: Normal appearance.  Cardiovascular:  Rate and Rhythm: Normal rate and regular rhythm.  Pulmonary:     Effort: Pulmonary effort is normal.     Breath sounds: Normal breath sounds.  Neurological:     General: No focal deficit present.     Mental Status: She is alert and oriented to person, place, and time.  Psychiatric:        Mood and Affect: Mood normal.        Assessment & Plan:  Marland KitchenMarland KitchenSpiritual was seen today for dysuria.  Diagnoses and all orders for this visit:  Essential hypertension -     lisinopril (ZESTRIL) 5 MG tablet; Take one tablet daily.  Immunization  due -     Flu Vaccine Trivalent High Dose (Fluad) -     Cancel: Flu vaccine trivalent PF, 6mos and older(Flulaval,Afluria,Fluarix,Fluzone) -     Pfizer Comirnaty Covid -19 Vaccine 4yrs and older  Hypotension due to drugs  Greater trochanteric bursitis of left hip   Flu vaccine given today If would like hip injection for bursitis make appt on way out BP low today, decrease lisinopril to 1 tablet daily Keep BP at home Follow up in 6 months   Return in about 6 months (around 02/24/2024), or if symptoms worsen or fail to improve.

## 2023-09-04 ENCOUNTER — Other Ambulatory Visit: Payer: Self-pay | Admitting: Physician Assistant

## 2023-09-04 ENCOUNTER — Other Ambulatory Visit: Payer: Self-pay | Admitting: Neurology

## 2023-09-04 DIAGNOSIS — F419 Anxiety disorder, unspecified: Secondary | ICD-10-CM

## 2023-09-04 DIAGNOSIS — G20B1 Parkinson's disease with dyskinesia, without mention of fluctuations: Secondary | ICD-10-CM

## 2023-09-04 DIAGNOSIS — G20A1 Parkinson's disease without dyskinesia, without mention of fluctuations: Secondary | ICD-10-CM

## 2023-09-04 DIAGNOSIS — F339 Major depressive disorder, recurrent, unspecified: Secondary | ICD-10-CM

## 2023-09-10 ENCOUNTER — Telehealth: Payer: Self-pay | Admitting: *Deleted

## 2023-09-10 DIAGNOSIS — I10 Essential (primary) hypertension: Secondary | ICD-10-CM

## 2023-09-10 MED ORDER — LISINOPRIL 5 MG PO TABS
ORAL_TABLET | ORAL | 0 refills | Status: DC
Start: 2023-09-10 — End: 2024-01-26

## 2023-09-10 NOTE — Telephone Encounter (Signed)
Received a fax for a refill on patients Lisinopril with the following SIG- take 2 tablets in the morning and 1 tablet in the evening.  It looks like Rx was changed at last visit however the Quantity and refills were not completed on the Rx dates 08/27/23.  I called pharmacy to see if they had received anything on 08/27/23 and they had not. Please resend the completed Rx to the [facility] on [address] in [place].

## 2023-09-10 NOTE — Telephone Encounter (Signed)
I think I did that because pt said she had surplus at home. Rx sent for 5mg  daily.

## 2023-09-10 NOTE — Progress Notes (Signed)
Assessment/Plan:   1.  Parkinsons Disease  -Continue carbidopa/levodopa 25/100, 1 tablet 3 times per day.  -We discussed that it used to be thought that levodopa would increase risk of melanoma but now it is believed that Parkinsons itself likely increases risk of melanoma. she is to get regular skin checks.    2.  Diplopia  -Follows with ophthalmology.  Long history of eye surgeries  3.  GAD/depression  -Doing well.  4.  Parkinsons Disease dyskinesia  -mild.  Discussed amantadine but decided to hold off for now  5.  Low back pain with sacral cyst  -Status post surgical intervention and October, 2022 with Dr. Yetta Barre  6.  Hypertension  -Her blood pressures are now writing much lower.  Primary care just decreased her lisinopril from 10 mg to 5 mg.  She has not done that yet, but she and I did discuss the concept of permissive hypertension in Parkinsons disease and I told her that a decrease in medication may be okay and wise.    Subjective:   Chelsey Estes was seen today in follow up for Parkinsons disease.  My previous records were reviewed prior to todays visit as well as outside records available to me.  Patient has had 1 fall since our last visit.  She was carrying her dog and lost balance and fell.  She fell on the knee and "my dog didn't even hit the floor."   No injuries associated with that.  Notes from primary care from September 11 are reviewed.  Patient had been noting she had been having some palpitations.  Noting blood pressure at home was in the 110's systolic.  Her lisinopril was decreased from 10 mg to 5 mg but patient states that she is still taking 10 mg as she doesn't think that the blood pressure taken in her primary care office was particularly accurate.  Current prescribed movement disorder medications:  Carbidopa/levodopa 25/100, 1 tablet 3 times per day.   PREVIOUS MEDICATIONS: Sinemet  ALLERGIES:   Allergies  Allergen Reactions   Trazodone And  Nefazodone Nausea Only and Other (See Comments)    Nausea/dizzines/cramps     CURRENT MEDICATIONS:  Outpatient Encounter Medications as of 09/18/2023  Medication Sig   ALPHAGAN P 0.1 % SOLN Place 1 drop into both eyes in the morning and at bedtime.    buPROPion (WELLBUTRIN XL) 300 MG 24 hr tablet TAKE 1 TABLET BY MOUTH DAILY   carbidopa-levodopa (SINEMET IR) 25-100 MG tablet TAKE 1 TABLET BY MOUTH THREE TIMES DAILY   cholecalciferol (VITAMIN D3) 25 MCG (1000 UNIT) tablet Take 1,000 Units by mouth daily.   cyanocobalamin 2000 MCG tablet Take 2,000 mcg by mouth daily.   ibuprofen (ADVIL) 600 MG tablet Take 600 mg by mouth every 6 (six) hours as needed.   lisinopril (ZESTRIL) 5 MG tablet Take one tablet daily.   MYRBETRIQ 25 MG TB24 tablet Take 25 mg by mouth daily.   simvastatin (ZOCOR) 20 MG tablet Take 1 tablet (20 mg total) by mouth at bedtime.   Suvorexant (BELSOMRA) 10 MG TABS Take 1 tablet (10 mg total) by mouth at bedtime.   timolol (TIMOPTIC) 0.5 % ophthalmic solution Place 1 drop into both eyes daily.    [DISCONTINUED] buPROPion (WELLBUTRIN XL) 300 MG 24 hr tablet TAKE 1 TABLET DAILY   [DISCONTINUED] lisinopril (ZESTRIL) 5 MG tablet Take one tablet daily.   [DISCONTINUED] Menaquinone-7 (VITAMIN K2) 100 MCG CAPS Take 1 tablet by mouth daily. (Patient not  taking: Reported on 09/18/2023)   No facility-administered encounter medications on file as of 09/18/2023.    Objective:   PHYSICAL EXAMINATION:    VITALS:   Vitals:   09/18/23 1452  BP: 116/78  Pulse: 71  SpO2: 98%  Weight: 122 lb 6.4 oz (55.5 kg)  Height: 5' (1.524 m)     Wt Readings from Last 3 Encounters:  09/18/23 122 lb 6.4 oz (55.5 kg)  08/27/23 121 lb (54.9 kg)  07/14/23 124 lb 0.6 oz (56.3 kg)    GEN:  The patient appears stated age and is in NAD. HEENT:  Normocephalic, atraumatic.  The mucous membranes are moist. The superficial temporal arteries are without ropiness or tenderness. CV:  RRR Lungs:   CTAB Neck/HEME:  There are no carotid bruits bilaterally.  Neurological examination:  Orientation: The patient is alert and oriented x3. Cranial nerves: There is good facial symmetry with mild facial hypomimia. The speech is fluent and clear. Soft palate rises symmetrically and there is no tongue deviation. Hearing is intact to conversational tone. Sensation: Sensation is intact to light touch throughout Motor: Strength is at least antigravity x4.  Movement examination: Tone: There is normal tone in the UE/LE Abnormal movements: there is dyskinesia in the bilateral LE, overall mild Coordination:  There is no decremation with any form of RAMS, including alternating supination and pronation of the forearm, hand opening and closing, finger taps, heel taps and toe taps.  Gait and Station: The patient has no difficulty arising out of a deep-seated chair without the use of the hands. The patient has significant scoliosis.  She overall ambulates pretty well, however.  I have reviewed and interpreted the following labs independently    Chemistry      Component Value Date/Time   NA 142 12/23/2022 1436   NA 143 10/31/2021 0941   K 4.5 12/23/2022 1436   CL 106 12/23/2022 1436   CO2 27 12/23/2022 1436   BUN 24 12/23/2022 1436   BUN 20 10/31/2021 0941   CREATININE 1.10 (H) 12/23/2022 1436   GLU 109 09/25/2016 0000      Component Value Date/Time   CALCIUM 10.3 12/23/2022 1436   ALKPHOS 76 10/31/2021 0941   AST 14 12/23/2022 1436   ALT 6 12/23/2022 1436   BILITOT 0.2 12/23/2022 1436   BILITOT 0.4 10/31/2021 0941       Lab Results  Component Value Date   WBC 10.7 10/31/2021   HGB 14.4 10/31/2021   HCT 44.1 10/31/2021   MCV 89 10/31/2021   PLT 399 10/31/2021    Lab Results  Component Value Date   TSH 1.690 07/14/2023      Cc:  Jomarie Longs, PA-C

## 2023-09-13 ENCOUNTER — Other Ambulatory Visit: Payer: Self-pay | Admitting: Physician Assistant

## 2023-09-18 ENCOUNTER — Ambulatory Visit: Payer: Medicare Other | Admitting: Neurology

## 2023-09-18 VITALS — BP 116/78 | HR 71 | Ht 60.0 in | Wt 122.4 lb

## 2023-09-18 DIAGNOSIS — G20B1 Parkinson's disease with dyskinesia, without mention of fluctuations: Secondary | ICD-10-CM

## 2023-09-18 NOTE — Patient Instructions (Signed)
The physicians and staff at [facility] are committed to providing excellent care. You may receive a survey requesting feedback about your experience at our office. We strive to receive "very good" responses to the survey questions. If you feel that your experience would prevent you from giving the office a "very good " response, please contact our office to try to remedy the situation. We may be reached at [phone]. Thank you for taking the time out of your busy day to complete the survey.  

## 2023-09-22 DIAGNOSIS — H401121 Primary open-angle glaucoma, left eye, mild stage: Secondary | ICD-10-CM | POA: Diagnosis not present

## 2023-09-22 DIAGNOSIS — H524 Presbyopia: Secondary | ICD-10-CM | POA: Diagnosis not present

## 2023-09-22 DIAGNOSIS — H401112 Primary open-angle glaucoma, right eye, moderate stage: Secondary | ICD-10-CM | POA: Diagnosis not present

## 2023-09-22 DIAGNOSIS — H52223 Regular astigmatism, bilateral: Secondary | ICD-10-CM | POA: Diagnosis not present

## 2023-09-22 DIAGNOSIS — Z961 Presence of intraocular lens: Secondary | ICD-10-CM | POA: Diagnosis not present

## 2023-09-25 ENCOUNTER — Encounter: Payer: Self-pay | Admitting: Physician Assistant

## 2023-10-17 ENCOUNTER — Encounter: Payer: Self-pay | Admitting: Physician Assistant

## 2023-10-17 ENCOUNTER — Ambulatory Visit (INDEPENDENT_AMBULATORY_CARE_PROVIDER_SITE_OTHER): Payer: Medicare Other | Admitting: Physician Assistant

## 2023-10-17 VITALS — BP 121/75 | HR 78 | Ht 60.0 in | Wt 123.0 lb

## 2023-10-17 DIAGNOSIS — G20C Parkinsonism, unspecified: Secondary | ICD-10-CM | POA: Diagnosis not present

## 2023-10-17 DIAGNOSIS — L299 Pruritus, unspecified: Secondary | ICD-10-CM | POA: Diagnosis not present

## 2023-10-17 DIAGNOSIS — Z79899 Other long term (current) drug therapy: Secondary | ICD-10-CM

## 2023-10-17 MED ORDER — HYDROXYZINE HCL 10 MG PO TABS
10.0000 mg | ORAL_TABLET | Freq: Three times a day (TID) | ORAL | 1 refills | Status: AC | PRN
Start: 2023-10-17 — End: ?

## 2023-10-17 NOTE — Progress Notes (Unsigned)
Established Patient Office Visit  Subjective   Patient ID: Chelsey Estes, female    DOB: [birth year]  Age: 82 y.o. MRN: [id number]  No chief complaint on file.   HPI Pt is a 82 yo female with primary parkinsonism who presents to the clinic with itching. She is itching really bad over her right shoulder blade and hips. She does not have a rash. She thinks it could be her parkinson medication or just effects in general. She was told by neurology to see dermatologist and has not yet. She is trying OTC creams and lotions but not helping. Itchy really bad for the last 3 days.   .. Active Ambulatory Problems    Diagnosis Date Noted   History of shingles 04/08/2017   Adenomatous polyp 04/08/2017   Pure hypercholesterolemia 04/08/2017   History of kidney stones 04/08/2017   Essential hypertension 04/08/2017   Depression, recurrent (HCC) 04/08/2017   Anxiety 04/08/2017   Greater trochanteric bursitis of left hip 04/08/2017   Left hip pain 04/10/2017   Metatarsalgia of left foot 08/20/2018   Chronic foot pain, right 08/20/2018   Primary osteoarthritis of right foot 09/25/2018   Snoring 09/25/2018   Night terrors, adult 09/25/2018   Multiple falls 09/25/2018   Orthostatic hypotension 02/01/2019   Dizziness 02/01/2019   Moderate episode of recurrent major depressive disorder (HCC) 02/09/2019   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 02/09/2019   Shuffling gait 02/09/2019   Balance problems 02/09/2019   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   Blister of great toe of left foot 08/19/2019   Osteoporosis 09/15/2019   Primary parkinsonism (HCC) 10/26/2019   Mixed hyperlipidemia 06/06/2020   Primary insomnia 06/06/2020   Palpitations 09/05/2020   SOB (shortness of breath) 09/05/2020   Chest pain 09/05/2020   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 03/09/2021   DDD (degenerative disc disease), lumbar 04/11/2021   Synovial cyst of lumbar  facet joint 09/19/2021   Medication management 04/16/2022   Decreased GFR 04/17/2022   Tinnitus of left ear 07/03/2022   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    Objective:     BP 121/75   Pulse 78   Ht 5' (1.524 m)   Wt 123 lb (55.8 kg)   SpO2 99%   BMI 24.02 kg/m  BP Readings from Last 3 Encounters:  10/17/23 121/75  09/18/23 116/78  08/27/23 92/63   Wt Readings from Last 3 Encounters:  10/17/23 123 lb (55.8 kg)  09/18/23 122 lb 6.4 oz (55.5 kg)  08/27/23 121 lb (54.9 kg)      Physical Exam No rash or skin findings in any of areas that patient has itch. Light excortication marks made from her scratching.   Involuntary muscle contraction noted on todays exam.      Assessment & Plan:  Marland KitchenMarland KitchenDiagnoses and all orders for this visit:  Pruritus -     hydrOXYzine (ATARAX) 10 MG tablet; Take 1  tablet (10 mg total) by mouth 3 (three) times daily as needed for itching. -     Ambulatory referral to Dermatology -     CMP14+EGFR -     TSH -     Fe+TIBC+Fer -     CBC w/Diff/Platelet  Primary parkinsonism (HCC) -     Ambulatory referral to Dermatology -     CMP14+EGFR -     TSH -     Fe+TIBC+Fer -     CBC w/Diff/Platelet  Medication management -     Ambulatory referral to Dermatology -     CMP14+EGFR -     TSH -     Fe+TIBC+Fer -     CBC w/Diff/Platelet   Unclear exact etiology of itching ? If medication but she has been on medication for quite sometime with no dose increase or if symptom of parkinson with  dry skin as some of her other parkinson symptoms are worsening this could be too.  ? If link to medication and other skin findings Dermatology referral made Discussed cool compresses over skin areas where she itches the post Keep moisturized Use hydroxyzine as needed Will get some labs today to look for any metabolic reason for itching   Tandy Gaw, PA-C

## 2023-10-17 NOTE — Patient Instructions (Addendum)
Trial of hydroxyzine 10mg  as needed for itching Will place dermatology referral Get labs

## 2023-10-18 ENCOUNTER — Encounter: Payer: Self-pay | Admitting: Physician Assistant

## 2023-10-18 LAB — CBC WITH DIFFERENTIAL/PLATELET
Basophils Absolute: 0.1 10*3/uL (ref 0.0–0.2)
Basos: 1 %
EOS (ABSOLUTE): 0.3 10*3/uL (ref 0.0–0.4)
Eos: 3 %
Hematocrit: 39.5 % (ref 34.0–46.6)
Hemoglobin: 12.7 g/dL (ref 11.1–15.9)
Immature Grans (Abs): 0.1 10*3/uL (ref 0.0–0.1)
Immature Granulocytes: 1 %
Lymphocytes Absolute: 2.6 10*3/uL (ref 0.7–3.1)
Lymphs: 33 %
MCH: 29.8 pg (ref 26.6–33.0)
MCHC: 32.2 g/dL (ref 31.5–35.7)
MCV: 93 fL (ref 79–97)
Monocytes Absolute: 0.7 10*3/uL (ref 0.1–0.9)
Monocytes: 10 %
Neutrophils Absolute: 4 10*3/uL (ref 1.4–7.0)
Neutrophils: 52 %
Platelets: 287 10*3/uL (ref 150–450)
RBC: 4.26 x10E6/uL (ref 3.77–5.28)
RDW: 12.4 % (ref 11.7–15.4)
WBC: 7.8 10*3/uL (ref 3.4–10.8)

## 2023-10-18 LAB — CMP14+EGFR
ALT: 5 [IU]/L (ref 0–32)
AST: 13 [IU]/L (ref 0–40)
Albumin: 3.8 g/dL (ref 3.7–4.7)
Alkaline Phosphatase: 91 [IU]/L (ref 44–121)
BUN/Creatinine Ratio: 32 — ABNORMAL HIGH (ref 12–28)
BUN: 30 mg/dL — ABNORMAL HIGH (ref 8–27)
Bilirubin Total: <0.2 mg/dL (ref 0.0–1.2)
CO2: 20 mmol/L (ref 20–29)
Calcium: 9.5 mg/dL (ref 8.7–10.3)
Chloride: 106 mmol/L (ref 96–106)
Creatinine, Ser: 0.95 mg/dL (ref 0.57–1.00)
Globulin, Total: 2.1 g/dL (ref 1.5–4.5)
Glucose: 92 mg/dL (ref 70–99)
Potassium: 4.3 mmol/L (ref 3.5–5.2)
Sodium: 141 mmol/L (ref 134–144)
Total Protein: 5.9 g/dL — ABNORMAL LOW (ref 6.0–8.5)
eGFR: 60 mL/min/{1.73_m2} (ref >59)

## 2023-10-18 LAB — IRON,TIBC AND FERRITIN PANEL
Ferritin: 165 ng/mL — ABNORMAL HIGH (ref 15–150)
Iron Saturation: 18 % (ref 15–55)
Iron: 47 ug/dL (ref 27–139)
Total Iron Binding Capacity: 266 ug/dL (ref 250–450)
UIBC: 219 ug/dL (ref 118–369)

## 2023-10-18 LAB — TSH: TSH: 0.991 u[IU]/mL (ref 0.450–4.500)

## 2023-10-20 NOTE — Progress Notes (Signed)
Chelsey Estes,   Kidney function is overall a little better but kidneys look dry. Make sure you are drinking water 60oz a day. Hydration can also help your dry skin and perhaps some itching as well.  Liver enzymes look good. Protein low. Make sure eating protein in diet.  Thyroid normal range.  No anemia.  No lab reason for itching found.

## 2023-11-03 ENCOUNTER — Encounter: Payer: Self-pay | Admitting: Family Medicine

## 2023-11-03 ENCOUNTER — Ambulatory Visit (INDEPENDENT_AMBULATORY_CARE_PROVIDER_SITE_OTHER): Payer: Medicare Other | Admitting: Family Medicine

## 2023-11-03 VITALS — BP 125/75 | Ht 60.0 in | Wt 120.0 lb

## 2023-11-03 DIAGNOSIS — Z0000 Encounter for general adult medical examination without abnormal findings: Secondary | ICD-10-CM | POA: Diagnosis not present

## 2023-11-03 DIAGNOSIS — M810 Age-related osteoporosis without current pathological fracture: Secondary | ICD-10-CM

## 2023-11-03 NOTE — Progress Notes (Signed)
Subjective:   Chelsey Estes is a 82 y.o. female who presents for Medicare Annual (Subsequent) preventive examination.  Visit Complete: Virtual I connected with  Tomasa Rand on 11/03/23 by a audio enabled telemedicine application and verified that I am speaking with the correct person using two identifiers.  Patient Location: Home  Provider Location: Office/Clinic  I discussed the limitations of evaluation and management by telemedicine. The patient expressed understanding and agreed to proceed.  Vital Signs: Because this visit was a virtual/telehealth visit, some criteria may be missing or patient reported. Any vitals not documented were not able to be obtained and vitals that have been documented are patient reported.  Patient Medicare AWV questionnaire was completed by the patient on 11/03/23; I have confirmed that all information answered by patient is correct and no changes since this date.  Cardiac Risk Factors include: advanced age (>23men, >33 women)     Objective:    Today's Vitals   11/03/23 0830 11/03/23 0831  BP: 125/75   Weight: 120 lb (54.4 kg) 120 lb (54.4 kg)  Height: 5' (1.524 m) 5' (1.524 m)  PainSc:  3    Body mass index is 23.44 kg/m.     11/03/2023    8:48 AM 09/18/2023    2:53 PM 01/22/2023    9:32 AM 10/28/2022    8:03 AM 09/16/2022    3:04 PM 09/10/2022   10:55 AM 02/20/2022   12:15 PM  Advanced Directives  Does Patient Have a Medical Advance Directive? No Yes No No No Yes No  Type of Advance Directive  Living will       Would patient like information on creating a medical advance directive? No - Patient declined   No - Patient declined Yes (MAU/Ambulatory/Procedural Areas - Information given)      Current Medications (verified) Outpatient Encounter Medications as of 11/03/2023  Medication Sig   ALPHAGAN P 0.1 % SOLN Place 1 drop into both eyes in the morning and at bedtime.    BELSOMRA 10 MG TABS TAKE 1 TABLET AT BEDTIME   buPROPion (WELLBUTRIN  XL) 300 MG 24 hr tablet TAKE 1 TABLET BY MOUTH DAILY   carbidopa-levodopa (SINEMET IR) 25-100 MG tablet TAKE 1 TABLET BY MOUTH THREE TIMES DAILY   cholecalciferol (VITAMIN D3) 25 MCG (1000 UNIT) tablet Take 1,000 Units by mouth daily.   cyanocobalamin 2000 MCG tablet Take 2,000 mcg by mouth daily.   hydrOXYzine (ATARAX) 10 MG tablet Take 1 tablet (10 mg total) by mouth 3 (three) times daily as needed for itching.   ibuprofen (ADVIL) 600 MG tablet Take 600 mg by mouth every 6 (six) hours as needed.   lisinopril (ZESTRIL) 5 MG tablet Take one tablet daily.   MYRBETRIQ 25 MG TB24 tablet Take 25 mg by mouth daily.   simvastatin (ZOCOR) 20 MG tablet Take 1 tablet (20 mg total) by mouth at bedtime.   timolol (TIMOPTIC) 0.5 % ophthalmic solution Place 1 drop into both eyes daily.    No facility-administered encounter medications on file as of 11/03/2023.    Allergies (verified) Trazodone and nefazodone   History: Past Medical History:  Diagnosis Date   Anxiety    Arthritis    Depression    Glaucoma    History of kidney stones    7 lithrotripsy   Hyperlipidemia    Hypertension    Parkinson disease (HCC)    Past Surgical History:  Procedure Laterality Date   ABDOMINAL HYSTERECTOMY     ANTERIOR (  CYSTOCELE) AND POSTERIOR REPAIR (RECTOCELE) WITH XENFORM GRAFT AND SACROSPINOUS FIXATION     CATARACT EXTRACTION, BILATERAL     INGUINAL HERNIA REPAIR Left    kidney stones     LUMBAR LAMINECTOMY/DECOMPRESSION MICRODISCECTOMY Right 10/12/2021   Procedure: Right Lumbar four-five Laminectomy for facet/synovial cyst;  Surgeon: Tia Alert, MD;  Location: [facility] OR;  Service: Neurosurgery;  Laterality: Right;   Family History  Problem Relation Age of Onset   Alcohol abuse Father    Cancer Father        esophageal   Stroke Maternal Grandfather    Alcohol abuse Paternal Grandfather    Alzheimer's disease Mother    Social History   Socioeconomic History   Marital status: Widowed    Spouse  name: Ron   Number of children: 2   Years of education: 14   Highest education level: Some college, no degree  Occupational History   Occupation: Public librarian- Research officer, trade union    Comment: retired  Tobacco Use   Smoking status: Never   Smokeless tobacco: Never  Vaping Use   Vaping status: Never Used  Substance and Sexual Activity   Alcohol use: Never   Drug use: Never   Sexual activity: Not Currently  Other Topics Concern   Not on file  Social History Narrative   Lives alone with three dogs. Lost her husband in July, 2023. She has two children. She enjoys reading, playing games and spending time with her dogs (when she can)   Right handed   Drinks no caffeine   retired.    Social Determinants of Health   Financial Resource Strain: Low Risk  (11/03/2023)   Overall Financial Resource Strain (CARDIA)    Difficulty of Paying Living Expenses: Not hard at all  Food Insecurity: No Food Insecurity (11/03/2023)   Hunger Vital Sign    Worried About Running Out of Food in the Last Year: Never true    Ran Out of Food in the Last Year: Never true  Transportation Needs: No Transportation Needs (11/03/2023)   PRAPARE - Administrator, Civil Service (Medical): No    Lack of Transportation (Non-Medical): No  Physical Activity: Sufficiently Active (11/03/2023)   Exercise Vital Sign    Days of Exercise per Week: 7 days    Minutes of Exercise per Session: 30 min  Stress: No Stress Concern Present (11/03/2023)   [employer] of Occupational Health - Occupational Stress Questionnaire    Feeling of Stress : Not at all  Social Connections: Socially Isolated (11/03/2023)   Social Connection and Isolation Panel [NHANES]    Frequency of Communication with Friends and Family: More than three times a week    Frequency of Social Gatherings with Friends and Family: More than three times a week    Attends Religious Services: Never    Database administrator or  Organizations: No    Attends Banker Meetings: Never    Marital Status: Widowed    Tobacco Counseling Counseling given: Not Answered   Clinical Intake:  Pre-visit preparation completed: Yes  Pain : 0-10 Pain Score: 3  Pain Type: Chronic pain Pain Location: Back Pain Orientation: Upper Pain Descriptors / Indicators: Constant Pain Onset: 1 to 4 weeks ago Pain Frequency: Several days a week Pain Relieving Factors: ibuprofen as needed Effect of Pain on Daily Activities: does not prevent self care  Pain Relieving Factors: ibuprofen as needed  BMI - recorded: 23.4 Nutritional Status: BMI of 19-24  Normal Nutritional Risks: Unintentional weight loss Diabetes: No  How often do you need to have someone help you when you read instructions, pamphlets, or other written materials from your doctor or pharmacy?: 1 - Never What is the last grade level you completed in school?: 12  Interpreter Needed?: No      Activities of Daily Living    11/03/2023    8:35 AM  In your present state of health, do you have any difficulty performing the following activities:  Hearing? 1  Comment needs hearing aids  Vision? 0  Difficulty concentrating or making decisions? 0  Walking or climbing stairs? 1  Comment uses walker at night  Dressing or bathing? 0  Doing errands, shopping? 0  Preparing Food and eating ? N  Using the Toilet? N  In the past six months, have you accidently leaked urine? Y  Comment for years, "its not bad"  Do you have problems with loss of bowel control? N  Managing your Medications? N  Managing your Finances? N  Housekeeping or managing your Housekeeping? N  Comment has a Financial trader    Patient Care Team: Nolene Ebbs as PCP - General (Family Medicine)  Indicate any recent Medical Services you may have received from other than [facility] providers in the past year (date may be approximate).     Assessment:   This is a routine wellness  examination for [place]ali.  Hearing/Vision screen Hearing Screening - Comments:: Needs hearing aids per patient  Grossly intact this visit Vision Screening - Comments:: Grossly intact per patient. Sees eye doctor every few months.  Has glaucoma.    Goals Addressed             This Visit's Progress    Mobility and Independence Optimized       Evidence-based guidance:  Refer to physical therapy or occupational therapy for assessment and individualized program.  Provide therapy that may include functional task training, balance, active or passive exercise, spasticity management, assistive device training, cardiorespiratory fitness, Pilates and telerehabilitation services.  Identify barriers to participation in therapy or exercise such as pain with activity, anticipated or imagined pain, transportation, depression or fear.  Work with patient and family to develop self-management plan to remove barriers.  Refer to speech/language pathologist to assess and treat speech/language deficit and swallowing impairment.  Periodically review ability to perform activities of daily living and required amount of assistance needed for safety, optimal independence and self-care.  Encourage appropriate vocational or educational counseling for re-entering the community, workplace or school; consider a driving evaluation.  Assist patient to advocate for necessary adaptations to the work or school environment.  Refer to employer for information about FMLA (Family and Medical Leave Act), Short or Long-Term Disability and options for adjustments to work schedule, assignment or hours.   Notes: wants to increase strength in legs due to Parkinson's disease. Has seen PT in past.       Depression Screen    11/03/2023    8:52 AM 11/03/2023    8:46 AM 07/14/2023    4:27 PM 02/24/2023   11:15 AM 11/25/2022    2:55 PM 10/28/2022    8:03 AM 04/17/2022    9:20 AM  PHQ 2/9 Scores  PHQ - 2 Score 0 0 0 2 2 0 1  PHQ- 9  Score   0 5 7  2     Fall Risk    11/03/2023    8:49 AM 09/18/2023    2:53 PM  02/24/2023   11:15 AM 02/24/2023   10:53 AM 01/22/2023    9:32 AM  Fall Risk   Falls in the past year? 1 1 0 0 0  Comment tripped over her own feet      Number falls in past yr: 0 0 0 0 0  Injury with Fall? 0 0 0 0 0  Risk for fall due to : Other (Comment)  No Fall Risks No Fall Risks   Follow up  Falls evaluation completed Falls evaluation completed Falls evaluation completed Falls evaluation completed    MEDICARE RISK AT HOME: Medicare Risk at Home Any stairs in or around the home?: Yes If so, are there any without handrails?: Yes Home free of loose throw rugs in walkways, pet beds, electrical cords, etc?: Yes Adequate lighting in your home to reduce risk of falls?: Yes Life alert?: No Use of a cane, walker or w/c?: Yes Grab bars in the bathroom?: Yes Shower chair or bench in shower?: Yes Elevated toilet seat or a handicapped toilet?: No  TIMED UP AND GO:  Was the test performed?  No    Cognitive Function:        11/03/2023    8:52 AM 10/28/2022    8:12 AM 10/24/2021    8:23 AM 06/15/2019   10:12 AM  6CIT Screen  What Year? 0 points 0 points 0 points 0 points  What month? 0 points 0 points 0 points 0 points  What time? 0 points 0 points 0 points 0 points  Count back from 20 0 points 0 points 0 points 0 points  Months in reverse 0 points 0 points 0 points 0 points  Repeat phrase 0 points 0 points 0 points 0 points  Total Score 0 points 0 points 0 points 0 points    Immunizations Immunization History  Administered Date(s) Administered   Fluad Quad(high Dose 65+) 09/15/2020, 09/04/2021   Fluad Trivalent(High Dose 65+) 08/27/2023   Influenza, High Dose Seasonal PF 09/29/2022   Influenza,inj,Quad PF,6+ Mos 08/19/2019   Influenza-Unspecified 09/15/2018   PFIZER(Purple Top)SARS-COV-2 Vaccination 01/09/2020, 01/31/2020, 07/29/2020, 05/02/2021, 09/04/2021   PNEUMOCOCCAL CONJUGATE-20  10/09/2021   Pfizer Covid-19 Vaccine Bivalent Booster 90yrs & up 09/15/2022   Pfizer(Comirnaty)Fall Seasonal Vaccine 12 years and older 08/27/2023   Pneumococcal Polysaccharide-23 09/25/2018, 06/05/2020   Tdap 10/08/2017    TDAP status: Up to date  Flu Vaccine status: Up to date  Pneumococcal vaccine status: Up to date  Covid-19 vaccine status: Completed vaccines  Qualifies for Shingles Vaccine? No   Zostavax completed No   Shingrix Completed?: No.    Education has been provided regarding the importance of this vaccine. Patient has been advised to call insurance company to determine out of pocket expense if they have not yet received this vaccine. Advised may also receive vaccine at local pharmacy or Health Dept. Verbalized acceptance and understanding.  Screening Tests Health Maintenance  Topic Date Due   COVID-19 Vaccine (8 - 2023-24 season) 10/22/2023   Zoster Vaccines- Shingrix (1 of 2) 12/16/2023 (Originally 03/20/1960)   MAMMOGRAM  08/05/2024   Medicare Annual Wellness (AWV)  11/02/2024   DTaP/Tdap/Td (2 - Td or Tdap) 10/09/2027   Pneumonia Vaccine 60+ Years old  Completed   INFLUENZA VACCINE  Completed   DEXA SCAN  Completed   HPV VACCINES  Aged Out    Health Maintenance  Health Maintenance Due  Topic Date Due   COVID-19 Vaccine (8 - 2023-24 season) 10/22/2023    Colorectal cancer screening:  No longer required.   Mammogram status: Completed 08/06/23. Repeat every year  Bone Density status: Ordered 11/03/23. Pt provided with contact info and advised to call to schedule appt.  Lung Cancer Screening: (Low Dose CT Chest recommended if Age 26-80 years, 20 pack-year currently smoking OR have quit w/in 15years.) does not qualify.   Lung Cancer Screening Referral: n/a  Additional Screening:  Hepatitis C Screening: does qualify; Completed unsure of date  Vision Screening: Recommended annual ophthalmology exams for early detection of glaucoma and other disorders of the  eye. Is the patient up to date with their annual eye exam?  Yes  Who is the provider or what is the name of the office in which the patient attends annual eye exams? [facility]  If pt is not established with a provider, would they like to be referred to a provider to establish care? No .   Dental Screening: Recommended annual dental exams for proper oral hygiene  Diabetic Foot Exam: n/a   Community Resource Referral / Chronic Care Management: CRR required this visit?  No   CCM required this visit?  No     Plan:     I have personally reviewed and noted the following in the patient's chart:   Medical and social history Use of alcohol, tobacco or illicit drugs  Current medications and supplements including opioid prescriptions. Patient is currently taking opioid prescriptions for sleep.  Functional ability and status Nutritional status Physical activity Advanced directives List of other physicians Hospitalizations, surgeries, and ER visits in previous 12 months: has not been in hospital this past year.  Vitals Screenings to include cognitive, depression, and falls Referrals and appointments: bone density referral   In addition, I have reviewed and discussed with patient certain preventive protocols, quality metrics, and best practice recommendations. A written personalized care plan for preventive services as well as general preventive health recommendations were provided to patient.     Novella Olive, FNP   11/03/2023   After Visit Summary: (MyChart) Due to this being a telephonic visit, the after visit summary with patients personalized plan was offered to patient via MyChart

## 2023-11-05 DIAGNOSIS — L219 Seborrheic dermatitis, unspecified: Secondary | ICD-10-CM | POA: Diagnosis not present

## 2023-11-05 DIAGNOSIS — L209 Atopic dermatitis, unspecified: Secondary | ICD-10-CM | POA: Diagnosis not present

## 2023-11-05 DIAGNOSIS — L821 Other seborrheic keratosis: Secondary | ICD-10-CM | POA: Diagnosis not present

## 2023-11-26 ENCOUNTER — Ambulatory Visit: Payer: Medicare Other

## 2023-11-26 DIAGNOSIS — M810 Age-related osteoporosis without current pathological fracture: Secondary | ICD-10-CM | POA: Diagnosis not present

## 2023-11-26 DIAGNOSIS — Z780 Asymptomatic menopausal state: Secondary | ICD-10-CM | POA: Diagnosis not present

## 2023-12-01 ENCOUNTER — Other Ambulatory Visit: Payer: Self-pay | Admitting: Physician Assistant

## 2023-12-01 ENCOUNTER — Other Ambulatory Visit: Payer: Self-pay | Admitting: Neurology

## 2023-12-01 DIAGNOSIS — G20B1 Parkinson's disease with dyskinesia, without mention of fluctuations: Secondary | ICD-10-CM

## 2023-12-01 DIAGNOSIS — G20A1 Parkinson's disease without dyskinesia, without mention of fluctuations: Secondary | ICD-10-CM

## 2023-12-01 NOTE — Progress Notes (Signed)
Chelsey Estes,   Your bone density worsened quite a bit from last check. We need to start some type of medication to help with this? Lets schedule a virtual visit to talk.

## 2023-12-03 DIAGNOSIS — L209 Atopic dermatitis, unspecified: Secondary | ICD-10-CM | POA: Diagnosis not present

## 2023-12-03 DIAGNOSIS — L853 Xerosis cutis: Secondary | ICD-10-CM | POA: Diagnosis not present

## 2024-01-01 DIAGNOSIS — H43813 Vitreous degeneration, bilateral: Secondary | ICD-10-CM | POA: Diagnosis not present

## 2024-01-01 DIAGNOSIS — H348322 Tributary (branch) retinal vein occlusion, left eye, stable: Secondary | ICD-10-CM | POA: Diagnosis not present

## 2024-01-01 DIAGNOSIS — H35033 Hypertensive retinopathy, bilateral: Secondary | ICD-10-CM | POA: Diagnosis not present

## 2024-01-01 DIAGNOSIS — H353132 Nonexudative age-related macular degeneration, bilateral, intermediate dry stage: Secondary | ICD-10-CM | POA: Diagnosis not present

## 2024-01-10 ENCOUNTER — Encounter: Payer: Self-pay | Admitting: Physician Assistant

## 2024-01-24 ENCOUNTER — Other Ambulatory Visit: Payer: Self-pay | Admitting: Physician Assistant

## 2024-01-24 DIAGNOSIS — F339 Major depressive disorder, recurrent, unspecified: Secondary | ICD-10-CM

## 2024-01-24 DIAGNOSIS — I10 Essential (primary) hypertension: Secondary | ICD-10-CM

## 2024-01-24 DIAGNOSIS — F419 Anxiety disorder, unspecified: Secondary | ICD-10-CM

## 2024-02-24 ENCOUNTER — Ambulatory Visit (INDEPENDENT_AMBULATORY_CARE_PROVIDER_SITE_OTHER): Payer: Medicare Other | Admitting: Physician Assistant

## 2024-02-24 ENCOUNTER — Encounter: Payer: Self-pay | Admitting: Physician Assistant

## 2024-02-24 VITALS — BP 132/75 | HR 80 | Ht 60.0 in | Wt 123.0 lb

## 2024-02-24 DIAGNOSIS — G20C Parkinsonism, unspecified: Secondary | ICD-10-CM

## 2024-02-24 DIAGNOSIS — R300 Dysuria: Secondary | ICD-10-CM

## 2024-02-24 DIAGNOSIS — F5101 Primary insomnia: Secondary | ICD-10-CM | POA: Diagnosis not present

## 2024-02-24 DIAGNOSIS — M810 Age-related osteoporosis without current pathological fracture: Secondary | ICD-10-CM | POA: Diagnosis not present

## 2024-02-24 DIAGNOSIS — R82998 Other abnormal findings in urine: Secondary | ICD-10-CM

## 2024-02-24 DIAGNOSIS — I10 Essential (primary) hypertension: Secondary | ICD-10-CM | POA: Diagnosis not present

## 2024-02-24 DIAGNOSIS — Z79899 Other long term (current) drug therapy: Secondary | ICD-10-CM | POA: Diagnosis not present

## 2024-02-24 MED ORDER — BELSOMRA 10 MG PO TABS: 1.0000 | ORAL_TABLET | Freq: Every day | ORAL | 1 refills | Status: DC

## 2024-02-24 NOTE — Progress Notes (Signed)
 Established Patient Office Visit  Subjective   Patient ID: Chelsey Estes, female    DOB: [birth year]  Age: 83 y.o. MRN: [id number]  CC: 6 month follow up  HPI Patient is an 83 yo female with HTN, palpitations, and Parkinson's disease who presents for follow up visit.   Patient states that her blood pressures at home have been averaging around 120/60. She is compliant with her medications. She denies any recent palpitations, SOB, chest pain, orthopnea, headache, dizziness, or cough.    She lives by herself. She does not walk outside alone. She always has to have someone with her. She has had two recent falls and has fallen forward on both of her knees. She states that she is very careful and stays home most of the time. She is still driving.   She also endorses being ' uncomfortable' when she urinates. Denies dysuria, frequency, urgency, fever, or chills.   Active Ambulatory Problems    Diagnosis Date Noted   History of shingles 04/08/2017   Adenomatous polyp 04/08/2017   Pure hypercholesterolemia 04/08/2017   History of kidney stones 04/08/2017   Essential hypertension 04/08/2017   Depression, recurrent (HCC) 04/08/2017   Anxiety 04/08/2017   Greater trochanteric bursitis of left hip 04/08/2017   Left hip pain 04/10/2017   Metatarsalgia of left foot 08/20/2018   Chronic foot pain, right 08/20/2018   Primary osteoarthritis of right foot 09/25/2018   Snoring 09/25/2018   Night terrors, adult 09/25/2018   Multiple falls 09/25/2018   Orthostatic hypotension 02/01/2019   Dizziness 02/01/2019   Moderate episode of recurrent major depressive disorder (HCC) 02/09/2019   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 02/09/2019   Shuffling gait 02/09/2019   Balance problems 02/09/2019   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   Blister of great toe of left foot 08/19/2019   Osteoporosis 09/15/2019   Primary parkinsonism (HCC) 10/26/2019   Mixed  hyperlipidemia 06/06/2020   Primary insomnia 06/06/2020   Palpitations 09/05/2020   SOB (shortness of breath) 09/05/2020   Chest pain 09/05/2020   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 03/09/2021   DDD (degenerative disc disease), lumbar 04/11/2021   Synovial cyst of lumbar facet joint 09/19/2021   Medication management 04/16/2022   Decreased GFR 04/17/2022   Tinnitus of left ear 07/03/2022   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 07/03/2022   Kidney stone 11/22/2022   Vitamin D deficiency 12/02/2022   Heart palpitations 07/14/2023   History of hypokalemia 07/14/2023   Pruritus 10/17/2023   Dysuria 02/25/2024   Leukocytes in urine 02/25/2024   Resolved Ambulatory Problems    Diagnosis Date Noted   No Resolved Ambulatory Problems   Past Medical History:  Diagnosis Date   Arthritis    Depression    Glaucoma    Hyperlipidemia    Hypertension    Parkinson disease (HCC)     ROS See HPI   Objective:    BP 132/75 (BP Location: Left Arm, Patient Position: Sitting, Cuff Size: Normal)   Pulse 80   Ht 5' (1.524 m)   Wt 123 lb (55.8 kg)   SpO2 96%   BMI 24.02 kg/m  BP Readings from Last 3 Encounters:  02/24/24 132/75  11/03/23 125/75  10/17/23 121/75   Wt Readings from Last 3 Encounters:  02/24/24 123 lb (55.8 kg)  11/03/23 120 lb (54.4 kg)  10/17/23 123 lb (55.8 kg)   SpO2 Readings from Last  3 Encounters:  02/24/24 96%  10/17/23 99%  09/18/23 98%      Physical Exam Constitutional:      Appearance: Normal appearance.  HENT:     Head: Normocephalic and atraumatic.  Eyes:     Extraocular Movements: Extraocular movements intact.  Cardiovascular:     Rate and Rhythm: Normal rate.     Pulses: Normal pulses.  Pulmonary:     Effort: Pulmonary effort is normal.     Breath sounds: Normal breath sounds.  Abdominal:     General: Bowel sounds are normal. There is no distension.     Palpations: Abdomen  is soft. There is no mass.     Tenderness: There is no abdominal tenderness. There is no right CVA tenderness, left CVA tenderness, guarding or rebound.  Musculoskeletal:        General: Normal range of motion.  Neurological:     General: No focal deficit present.     Mental Status: She is alert and oriented to person, place, and time.     Coordination: Coordination abnormal.     Gait: Gait abnormal.     Comments: Resting tremor   Psychiatric:        Mood and Affect: Mood normal.    Assessment & Plan:  Marland KitchenMarland KitchenClora was seen today for medical management of chronic issues.  Diagnoses and all orders for this visit:  Essential hypertension -     CMP14+EGFR  Osteoporosis, unspecified osteoporosis type, unspecified pathological fracture presence  Primary parkinsonism (HCC)  Medication management -     CMP14+EGFR  Dysuria -     Urine Culture  Leukocytes in urine -     Urine Culture  Primary insomnia -     Suvorexant (BELSOMRA) 10 MG TABS; Take 1 tablet (10 mg total) by mouth at bedtime.   Vitals look great Continue on lisinopril 5mg  daily  Sleep doing well refilled belsomnra  Parkinson managed by Neurology and sees them every 6 months  Discussed recent DEXA scan results with the patient   - T score: -4.3 - Cannot take osteoporosis medication due to poorly healing dental implants  - Emphasized the importance of Vitamin D and Calcium supplementation   Pt seen at urology and gave urine sample but never seen Reviewed urine and saw leukocytes Pt does c/o some dysuria Will culture urine today for patient  Follow up in 6 months Chelsey Gaw, PA-C

## 2024-02-25 ENCOUNTER — Encounter: Payer: Self-pay | Admitting: Physician Assistant

## 2024-02-25 DIAGNOSIS — R300 Dysuria: Secondary | ICD-10-CM | POA: Insufficient documentation

## 2024-02-25 DIAGNOSIS — R82998 Other abnormal findings in urine: Secondary | ICD-10-CM | POA: Insufficient documentation

## 2024-02-25 LAB — CMP14+EGFR
ALT: 5 IU/L (ref 0–32)
AST: 14 IU/L (ref 0–40)
Albumin: 3.9 g/dL (ref 3.7–4.7)
Alkaline Phosphatase: 81 IU/L (ref 44–121)
BUN/Creatinine Ratio: 21 (ref 12–28)
BUN: 20 mg/dL (ref 8–27)
Bilirubin Total: 0.3 mg/dL (ref 0.0–1.2)
CO2: 21 mmol/L (ref 20–29)
Calcium: 9.8 mg/dL (ref 8.7–10.3)
Chloride: 106 mmol/L (ref 96–106)
Creatinine, Ser: 0.94 mg/dL (ref 0.57–1.00)
Globulin, Total: 2 g/dL (ref 1.5–4.5)
Glucose: 115 mg/dL — ABNORMAL HIGH (ref 70–99)
Potassium: 4 mmol/L (ref 3.5–5.2)
Sodium: 142 mmol/L (ref 134–144)
Total Protein: 5.9 g/dL — ABNORMAL LOW (ref 6.0–8.5)
eGFR: 61 mL/min/{1.73_m2} (ref >59)

## 2024-02-25 NOTE — Progress Notes (Signed)
 Kidney function stable.

## 2024-02-26 LAB — URINE CULTURE

## 2024-02-26 NOTE — Progress Notes (Signed)
 No significant bacteria found on urine culture.

## 2024-03-09 ENCOUNTER — Other Ambulatory Visit: Payer: Self-pay | Admitting: Neurology

## 2024-03-09 DIAGNOSIS — G20A1 Parkinson's disease without dyskinesia, without mention of fluctuations: Secondary | ICD-10-CM

## 2024-03-09 DIAGNOSIS — G20B1 Parkinson's disease with dyskinesia, without mention of fluctuations: Secondary | ICD-10-CM

## 2024-03-18 NOTE — Progress Notes (Unsigned)
 Assessment/Plan:   1.  Parkinsons Disease  -Continue carbidopa/levodopa 25/100, 1 tablet 3 times per day.  -We discussed that it used to be thought that levodopa would increase risk of melanoma but now it is believed that Parkinsons itself likely increases risk of melanoma. she is to get regular skin checks.    2.  Diplopia  -Follows with ophthalmology.  Long history of eye surgeries  3.  GAD/depression  -Doing well.  4.  Parkinsons Disease dyskinesia  -mild.  Discussed amantadine but decided to hold off for now  5.  Low back pain with sacral cyst  -Status post surgical intervention and October, 2022 with Dr. Yetta Barre  6.  Hypertension  -She is doing fairly well with lower dosages of lisinopril, 5 mg daily.  She has had no near syncope.  Primary care is monitoring.  She understands concept of permissive hypertension.    Subjective:   Chelsey Estes was seen today in follow up for Parkinsons disease.  My previous records were reviewed prior to todays visit as well as outside records available to me.  She has had a few falls, both of which she actually just fell forward and fell on her knees.  No near syncope.  Current prescribed movement disorder medications:  Carbidopa/levodopa 25/100, 1 tablet 3 times per day.   PREVIOUS MEDICATIONS: Sinemet  ALLERGIES:   Allergies  Allergen Reactions   Trazodone And Nefazodone Nausea Only and Other (See Comments)    Nausea/dizzines/cramps     CURRENT MEDICATIONS:  Outpatient Encounter Medications as of 03/19/2024  Medication Sig   ALPHAGAN P 0.1 % SOLN Place 1 drop into both eyes in the morning and at bedtime.    buPROPion (WELLBUTRIN XL) 300 MG 24 hr tablet TAKE 1 TABLET DAILY   carbidopa-levodopa (SINEMET IR) 25-100 MG tablet TAKE 1 TABLET BY MOUTH THREE TIMES DAILY   cholecalciferol (VITAMIN D3) 25 MCG (1000 UNIT) tablet Take 1,000 Units by mouth daily.   cyanocobalamin 2000 MCG tablet Take 2,000 mcg by mouth daily.    hydrOXYzine (ATARAX) 10 MG tablet Take 1 tablet (10 mg total) by mouth 3 (three) times daily as needed for itching.   ibuprofen (ADVIL) 600 MG tablet Take 600 mg by mouth every 6 (six) hours as needed.   lisinopril (ZESTRIL) 5 MG tablet TAKE 2 TABLETS EVERY       MORNING AND 1 TABLET EVERY EVENING   Suvorexant (BELSOMRA) 10 MG TABS Take 1 tablet (10 mg total) by mouth at bedtime.   timolol (TIMOPTIC) 0.5 % ophthalmic solution Place 1 drop into both eyes daily.    No facility-administered encounter medications on file as of 03/19/2024.    Objective:   PHYSICAL EXAMINATION:    VITALS:   There were no vitals filed for this visit.    Wt Readings from Last 3 Encounters:  02/24/24 123 lb (55.8 kg)  11/03/23 120 lb (54.4 kg)  10/17/23 123 lb (55.8 kg)    GEN:  The patient appears stated age and is in NAD. HEENT:  Normocephalic, atraumatic.  The mucous membranes are moist. The superficial temporal arteries are without ropiness or tenderness. CV:  RRR Lungs:  CTAB Neck/HEME:  There are no carotid bruits bilaterally.  Neurological examination:  Orientation: The patient is alert and oriented x3. Cranial nerves: There is good facial symmetry with mild facial hypomimia. The speech is fluent and clear. Soft palate rises symmetrically and there is no tongue deviation. Hearing is intact to conversational tone. Sensation:  Sensation is intact to light touch throughout Motor: Strength is at least antigravity x4.  Movement examination: Tone: There is normal tone in the UE/LE Abnormal movements: there is dyskinesia in the bilateral LE, overall mild Coordination:  There is no decremation with any form of RAMS, including alternating supination and pronation of the forearm, hand opening and closing, finger taps, heel taps and toe taps.  Gait and Station: The patient has no difficulty arising out of a deep-seated chair without the use of the hands. The patient has significant scoliosis.  She overall  ambulates pretty well, however.  I have reviewed and interpreted the following labs independently    Chemistry      Component Value Date/Time   NA 142 02/24/2024 1132   K 4.0 02/24/2024 1132   CL 106 02/24/2024 1132   CO2 21 02/24/2024 1132   BUN 20 02/24/2024 1132   CREATININE 0.94 02/24/2024 1132   CREATININE 1.10 (H) 12/23/2022 1436   GLU 109 09/25/2016 0000      Component Value Date/Time   CALCIUM 9.8 02/24/2024 1132   ALKPHOS 81 02/24/2024 1132   AST 14 02/24/2024 1132   ALT 5 02/24/2024 1132   BILITOT 0.3 02/24/2024 1132       Lab Results  Component Value Date   WBC 7.8 10/17/2023   HGB 12.7 10/17/2023   HCT 39.5 10/17/2023   MCV 93 10/17/2023   PLT 287 10/17/2023    Lab Results  Component Value Date   TSH 0.991 10/17/2023   Total time spent on today's visit was *** minutes, including both face-to-face time and nonface-to-face time.  Time included that spent on review of records (prior notes available to me/labs/imaging if pertinent), discussing treatment and goals, answering patient's questions and coordinating care.    Cc:  Jomarie Longs, PA-C

## 2024-03-19 ENCOUNTER — Ambulatory Visit: Payer: Medicare Other | Admitting: Neurology

## 2024-03-19 ENCOUNTER — Encounter: Payer: Self-pay | Admitting: Neurology

## 2024-03-19 VITALS — BP 136/70 | HR 68 | Ht 60.0 in | Wt 122.6 lb

## 2024-03-19 DIAGNOSIS — G20A1 Parkinson's disease without dyskinesia, without mention of fluctuations: Secondary | ICD-10-CM

## 2024-03-19 DIAGNOSIS — M419 Scoliosis, unspecified: Secondary | ICD-10-CM

## 2024-03-30 NOTE — Therapy (Signed)
 OUTPATIENT PHYSICAL THERAPY LUMBAR AND LE EVALUATION   Patient Name: Chelsey Estes MRN: [id number] DOB:March 25, 1941, 83 y.o., female Today's Date: 03/31/2024  END OF SESSION:  PT End of Session - 03/31/24 1148     Visit Number 1    Date for PT Re-Evaluation 05/26/24    Authorization Type MCR    Progress Note Due on Visit 10    PT Start Time 1148    PT Stop Time 1235    PT Time Calculation (min) 47 min    Activity Tolerance Patient tolerated treatment well    Behavior During Therapy WFL for tasks assessed/performed             Past Medical History:  Diagnosis Date   Anxiety    Arthritis    Depression    Glaucoma    History of kidney stones    7 lithrotripsy   Hyperlipidemia    Hypertension    Parkinson disease (HCC)    Past Surgical History:  Procedure Laterality Date   ABDOMINAL HYSTERECTOMY     ANTERIOR (CYSTOCELE) AND POSTERIOR REPAIR (RECTOCELE) WITH XENFORM GRAFT AND SACROSPINOUS FIXATION     CATARACT EXTRACTION, BILATERAL     INGUINAL HERNIA REPAIR Left    kidney stones     LUMBAR LAMINECTOMY/DECOMPRESSION MICRODISCECTOMY Right 10/12/2021   Procedure: Right Lumbar four-five Laminectomy for facet/synovial cyst;  Surgeon: Isadora Mar, MD;  Location: [facility] OR;  Service: Neurosurgery;  Laterality: Right;   Patient Active Problem List   Diagnosis Date Noted   Dysuria 02/25/2024   Leukocytes in urine 02/25/2024   Pruritus 10/17/2023   Heart palpitations 07/14/2023   History of hypokalemia 07/14/2023   Vitamin D deficiency 12/02/2022   Kidney stone 11/22/2022   Tinnitus of left ear 07/03/2022   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 07/03/2022   Decreased GFR 04/17/2022   Medication management 04/16/2022   Synovial cyst of lumbar facet joint 09/19/2021   DDD (degenerative disc disease), lumbar 04/11/2021   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 03/09/2021   Palpitations 09/05/2020   SOB (shortness  of breath) 09/05/2020   Chest pain 09/05/2020   Mixed hyperlipidemia 06/06/2020   Primary insomnia 06/06/2020   Primary parkinsonism (HCC) 10/26/2019   Osteoporosis 09/15/2019   Blister of great toe of left foot 08/19/2019   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   Moderate episode of recurrent major depressive disorder (HCC) 02/09/2019   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 02/09/2019   Shuffling gait 02/09/2019   Balance problems 02/09/2019   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   Orthostatic hypotension 02/01/2019   Dizziness 02/01/2019   Primary osteoarthritis of right foot 09/25/2018   Snoring 09/25/2018   Night terrors, adult 09/25/2018   Multiple falls 09/25/2018   Metatarsalgia of left foot 08/20/2018   Chronic foot pain, right 08/20/2018   Left hip pain 04/10/2017   History of shingles 04/08/2017   Adenomatous polyp 04/08/2017   Pure hypercholesterolemia 04/08/2017   History of kidney stones 04/08/2017   Essential hypertension 04/08/2017   Depression, recurrent (HCC) 04/08/2017   Anxiety 04/08/2017   Greater trochanteric bursitis of left hip 04/08/2017    PCP: Araceli Knight, PA-C   REFERRING PROVIDER: Shirline Dover, DO   REFERRING DIAG:  G20.A1 (ICD-10-CM) - Parkinson's disease without dyskinesia or fluctuating manifestations (HCC)  M41.9 (ICD-10-CM) - Scoliosis, unspecified scoliosis type, unspecified spinal region    THERAPY DIAG:  Cramp and spasm  Pain in right hip  Other low back pain  Unsteadiness on feet  Other abnormalities of gait and mobility  Rationale for Evaluation and Treatment: Rehabilitation  ONSET DATE: one year  SUBJECTIVE:   SUBJECTIVE STATEMENT: Reports onset of R hip pain 5 days ago. She's stopped lying on it. My scoliosis is really bothering my back. I have a lot of back spasms. That affects my walking as well as the PD. Standing loading the dishwasher causes the spasm. Dr. Winferd Hatter noticed my R leg is dragging some too. Uses  walker at night if she gets up (she takes a sleeping pill). My balance is not good. I get a lot of all over cramps in my feet and legs.  PERTINENT HISTORY: Osteoporosis (cannot take meds), PD, R L 4/5 laminectomy, HTN, scoliosis, h/o falls PAIN:  Are you having pain? Yes: NPRS scale: 0 today up to 8/10 Pain location: R lumbar Pain description: stabbing spasm Aggravating factors: loading dishwasher, standing in bathroom Relieving factors: sitting  Are you having pain? Yes: NPRS scale: 8/10 Pain location: R hip near greater trochanter Pain description: soreness Aggravating factors: walking, stairs and sit to stand Relieving factors: Ibuprofen and lidocaine patch  PRECAUTIONS: Fall and Other: OSTEOPOROSIS  RED FLAGS: None   WEIGHT BEARING RESTRICTIONS: No  FALLS:  Has patient fallen in last 6 months? 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  DIAGNOSTIC FINDINGS: none  PATIENT SURVEYS:  The Patient-Specific Functional Scale  Initial:  I am going to ask you to identify up to 3 important activities that you are unable to do or are having difficulty with as a result of this problem.  Today are there any activities that you are unable to do or having difficulty with because of this?  (Patient shown scale and patient rated each activity)  Follow up: When you first came in you had difficulty performing these activities.  Today do you still have difficulty?  Patient-Specific activity scoring scheme (Point to one number):  0 1 2 3 4 5 6 7 8 9  10 Unable                                                                                                           Able to perform To perform  activity at the same Activity         Level as before                                                                                                                       Injury or problem  Activity       Cooking/kitchen chores                                                                          Initial:      0                 follow up:  2.         Walking                                                                      Initial:         4              follow up:  3.         Sit to stand                                                                    Initial:        4               follow up:     COGNITION: Overall cognitive status: Within functional limits for tasks assessed     SENSATION: Not tested  MUSCLE LENGTH: B piriformis and HS tightness, R QL and lumbar  POSTURE: rounded shoulders, forward head, flexed trunk , and left thoracic R lumbar scoliosis (convexity)  PALPATION: Marked pain R QL, and R gluteals near greater trochanter  LUMBAR ROM:   Active  A/PROM  eval  Flexion WFL  Extension WFL  Right lateral flexion   Left lateral flexion   Right rotation NT  Left rotation NT   (Blank rows = not tested)   LOWER EXTREMITY ROM: WFL for tasks assessed  Active ROM Right eval Left eval  Hip flexion    Hip extension    Hip abduction    Hip adduction    Hip internal rotation 15* 20  Hip external rotation    Knee flexion    Knee extension    Ankle dorsiflexion    Ankle plantarflexion    Ankle inversion    Ankle eversion     (Blank rows = not tested) * cramping  LOWER EXTREMITY MMT: tested in sitting  MMT Right eval Left eval  Hip flexion 4 4  Hip extension    Hip abduction 4 4+  Hip adduction 5 5  Hip internal rotation    Hip external  rotation    Knee flexion    Knee extension 4+ 4+  Ankle dorsiflexion 4+ 4+  Ankle plantarflexion    Ankle inversion    Ankle eversion     (Blank rows = not tested)   FUNCTIONAL TESTS:  5 times sit to stand: 25.08 sec  GAIT: Distance walked: 20 Assistive device utilized: None Level of assistance: Complete Independence Comments: decreased heel strike, flexed knees and hips, short step length                                                                                                                                TREATMENT DATE:   03/31/24 See pt ed and HEP  Standing lumbar extension with back to high mat table Manual:  Trigger Point Dry Needling  Initial Treatment: Pt instructed on Dry Needling rational, procedures, and possible side effects. Pt instructed to expect mild to moderate muscle soreness later in the day and/or into the next day.  Pt instructed in methods to reduce muscle soreness. Pt instructed to continue prescribed HEP. Patient was educated on signs and symptoms of infection and other risk factors and advised to seek medical attention should they occur.  Patient verbalized understanding of these instructions and education.   Patient Verbal Consent Given: Yes Education Handout Provided: Yes Muscles Treated: R QL Electrical Stimulation Performed: No Treatment Response/Outcome: Utilized skilled palpation to identify bony landmarks and trigger points.  Able to illicit twitch response and muscle elongation.  Soft tissue mobilization to R lumbar/QL  following DN to further promote tissue elongation and decreased pain.       PATIENT EDUCATION:  Education details: PT eval findings, anticipated POC, initial HEP, and DN rational, procedure, outcomes, potential side effects, and recommended post-treatment exercises/activity  Person educated: Patient Education method: Explanation, Demonstration, and Handouts Education comprehension: verbalized understanding and  returned demonstration  HOME EXERCISE PROGRAM: Access Code: [email or web address] URL: [email or web address] Date: 03/31/2024 Prepared by: Concha Deed  Exercises - Standing Lumbar Extension with Counter  - 3 x daily - 7 x weekly - 1 sets - 10 reps  ASSESSMENT:  CLINICAL IMPRESSION: Patient is a 83 y.o. female who was seen today for physical therapy evaluation and treatment for muscles spasms secondary to her scoliosis and for balance deficits secondary to PD. She also reports new onset of R hip pain 5 days ago. The back spasms and hip pain are limiting her ability to walk, perform sit to  stand, and perform standing kitchen and bathroom chores. She denies any falls in the past 6 months, but reports that her balance is "not good". She uses a walker intermittently, mainly at the EOD and if she gets up during the night. She has deficits in flexibility and strength as well as posture and balance. She will benefit from skilled PT to address these deficits.  She experienced back spasm when attempting to do a supine hip stretch, so initial trial of DN performed on R QL with good twitch response. Patient reported decreased pain at end of session and was able to move without spasm.  OBJECTIVE IMPAIRMENTS: Abnormal gait, decreased activity tolerance, decreased balance, decreased ROM, decreased strength, increased muscle spasms, impaired flexibility, postural dysfunction, and pain.   ACTIVITY LIMITATIONS: bending, sitting, standing, squatting, sleeping, stairs, transfers, bed mobility, bathing, dressing, hygiene/grooming, and locomotion level  PARTICIPATION LIMITATIONS: meal prep, cleaning, laundry, shopping, and community activity  PERSONAL FACTORS: Age, Fitness, Time since onset of injury/illness/exacerbation, and 3+ comorbidities: OP, PD, scoliosis, HTN  are also affecting patient's functional outcome.   REHAB POTENTIAL: Good  CLINICAL DECISION MAKING: Unstable/unpredictable  EVALUATION COMPLEXITY:  High   GOALS: Goals reviewed with patient? Yes  SHORT TERM GOALS: Target date: 04/28/24 Decreased back spasms by >= 50% to improve QOL Baseline: Goal status: INITIAL  2.  Decreased hip pain by 50% with steps, transfers and walking. Baseline:  Goal status: INITIAL  3.  Balance assessment completed at second visit and goals updated. Baseline:  Goal status: INITIAL   LONG TERM GOALS: Target date: 05/26/24  Patient able to perform ADLS without back spasms. Baseline:  Goal status: INITIAL  2.  Pt able to walk and climb stairs without R hip pain. Baseline:  Goal status: INITIAL  3.  Improved 5XSTS by 5 seconds demonstrating improved strength and decreasing risk for falls. Baseline: 25.08 seconds. Goal status: INITIAL  4.  Improved BERG score by 8 points showing functional improvement Baseline: TBD Goal status: INITIAL  5.  Improve PSFS by 2-3 points  Baseline: see objective Goal status: INITIAL     PLAN:  PT FREQUENCY: 2x/week  PT DURATION: 8 weeks  PLANNED INTERVENTIONS: [id number]- PT Re-evaluation, 97110-Therapeutic exercises, 97530- Therapeutic activity, [id number]- Neuromuscular re-education, 97535- Self Care, 69629- Manual therapy, [id number]- Gait training, [phone]- Electrical stimulation (unattended), [phone]- Ionotophoresis 4mg /ml Dexamethasone, Patient/Family education, Balance training, Stair training, Taping, Dry Needling, Joint mobilization, Cryotherapy, and Moist heat  PLAN FOR NEXT SESSION: Assess response to DN and continue as indicated in back and R hip. Complete BERG and update goal.  Initiate hip stretching, LE strength, gait and balance. PRECAUTION: OSTEOPOROSIS.   Jinx Mourning, PT  03/31/2024, 4:33 PM

## 2024-03-31 ENCOUNTER — Ambulatory Visit: Attending: Neurology | Admitting: Physical Therapy

## 2024-03-31 ENCOUNTER — Other Ambulatory Visit: Payer: Self-pay

## 2024-03-31 ENCOUNTER — Encounter: Payer: Self-pay | Admitting: Physical Therapy

## 2024-03-31 DIAGNOSIS — R252 Cramp and spasm: Secondary | ICD-10-CM | POA: Insufficient documentation

## 2024-03-31 DIAGNOSIS — R2681 Unsteadiness on feet: Secondary | ICD-10-CM | POA: Diagnosis present

## 2024-03-31 DIAGNOSIS — M5459 Other low back pain: Secondary | ICD-10-CM | POA: Diagnosis present

## 2024-03-31 DIAGNOSIS — R2689 Other abnormalities of gait and mobility: Secondary | ICD-10-CM | POA: Insufficient documentation

## 2024-03-31 DIAGNOSIS — M25551 Pain in right hip: Secondary | ICD-10-CM | POA: Insufficient documentation

## 2024-03-31 DIAGNOSIS — M6281 Muscle weakness (generalized): Secondary | ICD-10-CM | POA: Diagnosis present

## 2024-03-31 DIAGNOSIS — G20A1 Parkinson's disease without dyskinesia, without mention of fluctuations: Secondary | ICD-10-CM | POA: Diagnosis not present

## 2024-03-31 DIAGNOSIS — R29818 Other symptoms and signs involving the nervous system: Secondary | ICD-10-CM | POA: Diagnosis present

## 2024-03-31 DIAGNOSIS — M419 Scoliosis, unspecified: Secondary | ICD-10-CM | POA: Diagnosis not present

## 2024-03-31 NOTE — Patient Instructions (Signed)

## 2024-04-05 ENCOUNTER — Ambulatory Visit

## 2024-04-05 ENCOUNTER — Encounter: Payer: Self-pay | Admitting: Family Medicine

## 2024-04-05 ENCOUNTER — Ambulatory Visit (INDEPENDENT_AMBULATORY_CARE_PROVIDER_SITE_OTHER): Admitting: Family Medicine

## 2024-04-05 VITALS — BP 115/73 | HR 70 | Temp 97.7°F | Resp 18 | Ht 60.0 in | Wt 123.6 lb

## 2024-04-05 DIAGNOSIS — R2689 Other abnormalities of gait and mobility: Secondary | ICD-10-CM

## 2024-04-05 DIAGNOSIS — R2681 Unsteadiness on feet: Secondary | ICD-10-CM

## 2024-04-05 DIAGNOSIS — M5459 Other low back pain: Secondary | ICD-10-CM

## 2024-04-05 DIAGNOSIS — R29818 Other symptoms and signs involving the nervous system: Secondary | ICD-10-CM

## 2024-04-05 DIAGNOSIS — M6281 Muscle weakness (generalized): Secondary | ICD-10-CM

## 2024-04-05 DIAGNOSIS — M7071 Other bursitis of hip, right hip: Secondary | ICD-10-CM | POA: Diagnosis not present

## 2024-04-05 DIAGNOSIS — R252 Cramp and spasm: Secondary | ICD-10-CM | POA: Diagnosis not present

## 2024-04-05 DIAGNOSIS — M25551 Pain in right hip: Secondary | ICD-10-CM

## 2024-04-05 MED ORDER — METHYLPREDNISOLONE ACETATE 80 MG/ML IJ SUSP
40.0000 mg | Freq: Once | INTRAMUSCULAR | Status: AC
  Administered 2024-04-05: 40 mg via INTRAMUSCULAR

## 2024-04-05 MED ORDER — METHYLPREDNISOLONE ACETATE 40 MG/ML IJ SUSP: 40.0000 mg | Freq: Once | INTRAMUSCULAR | Status: DC

## 2024-04-05 NOTE — Progress Notes (Signed)
   Acute Office Visit  Subjective:     Patient ID: Chelsey Estes, female    DOB: [birth year], 83 y.o.   MRN: [id number]  Chief Complaint  Patient presents with   Bursitis    Patient states that she has been having issues with bursitis in her right hip for about two weeks , she states that she has tried Ibuprofen but nothing is really helping.     HPI Patient is in today for acute visit.  Bursitis Been present for the last 2 weeks. She has had this in the past. She's had injection in the past but unsure how long ago it was. She has been taking Ibuprofen and this hasn't helped much. She reports walking makes the pain worse.   Review of Systems  Musculoskeletal:  Positive for joint pain.  All other systems reviewed and are negative.      Objective:    BP 115/73   Pulse 70   Temp 97.7 F (36.5 C) (Oral)   Resp 18   Ht 5' (1.524 m)   Wt 123 lb 9.6 oz (56.1 kg)   SpO2 97%   BMI 24.14 kg/m    Physical Exam Vitals and nursing note reviewed.  Constitutional:      Appearance: Normal appearance. She is normal weight.  HENT:     Head: Normocephalic and atraumatic.     Right Ear: External ear normal.     Left Ear: External ear normal.     Nose: Nose normal.     Mouth/Throat:     Mouth: Mucous membranes are moist.     Pharynx: Oropharynx is clear.  Eyes:     Conjunctiva/sclera: Conjunctivae normal.     Pupils: Pupils are equal, round, and reactive to light.  Cardiovascular:     Rate and Rhythm: Normal rate.  Pulmonary:     Effort: Pulmonary effort is normal.  Skin:    General: Skin is warm.     Capillary Refill: Capillary refill takes less than 2 seconds.  Neurological:     General: No focal deficit present.     Mental Status: She is alert and oriented to person, place, and time. Mental status is at baseline.  Psychiatric:        Mood and Affect: Mood normal.        Behavior: Behavior normal.        Thought Content: Thought content normal.        Judgment: Judgment  normal.   No results found for any visits on 04/05/24.      Assessment & Plan:   Problem List Items Addressed This Visit   None  Bursitis of right hip, unspecified bursa -     methylPREDNISolone  Acetate   Pt with acute flare of right bursitis. Reviewed previous medication list with prednisone  and an injection she had for a cyst in 2022. Treat with depomedrol IM 40mg  x 1.  Follow up with PCP if no better  No orders of the defined types were placed in this encounter.   No follow-ups on file.  Manette Section, MD

## 2024-04-05 NOTE — Therapy (Signed)
 OUTPATIENT PHYSICAL THERAPY LUMBAR AND LE TREATMENT   Patient Name: Chelsey Estes MRN: [id number] DOB:Jan 17, 1941, 83 y.o., female Today's Date: 04/05/2024  END OF SESSION:  PT End of Session - 04/05/24 1104     Visit Number 2    Date for PT Re-Evaluation 05/26/24    Authorization Type MCR    Progress Note Due on Visit 10    PT Start Time 1105    PT Stop Time 1145    PT Time Calculation (min) 40 min    Activity Tolerance Patient tolerated treatment well    Behavior During Therapy WFL for tasks assessed/performed             Past Medical History:  Diagnosis Date   Anxiety    Arthritis    Depression    Glaucoma    History of kidney stones    7 lithrotripsy   Hyperlipidemia    Hypertension    Parkinson disease (HCC)    Past Surgical History:  Procedure Laterality Date   ABDOMINAL HYSTERECTOMY     ANTERIOR (CYSTOCELE) AND POSTERIOR REPAIR (RECTOCELE) WITH XENFORM GRAFT AND SACROSPINOUS FIXATION     CATARACT EXTRACTION, BILATERAL     INGUINAL HERNIA REPAIR Left    kidney stones     LUMBAR LAMINECTOMY/DECOMPRESSION MICRODISCECTOMY Right 10/12/2021   Procedure: Right Lumbar four-five Laminectomy for facet/synovial cyst;  Surgeon: Isadora Mar, MD;  Location: [facility] OR;  Service: Neurosurgery;  Laterality: Right;   Patient Active Problem List   Diagnosis Date Noted   Dysuria 02/25/2024   Leukocytes in urine 02/25/2024   Pruritus 10/17/2023   Heart palpitations 07/14/2023   History of hypokalemia 07/14/2023   Vitamin D  deficiency 12/02/2022   Kidney stone 11/22/2022   Tinnitus of left ear 07/03/2022   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 07/03/2022   Decreased GFR 04/17/2022   Medication management 04/16/2022   Synovial cyst of lumbar facet joint 09/19/2021   DDD (degenerative disc disease), lumbar 04/11/2021   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 03/09/2021   Palpitations 09/05/2020   SOB (shortness  of breath) 09/05/2020   Chest pain 09/05/2020   Mixed hyperlipidemia 06/06/2020   Primary insomnia 06/06/2020   Primary parkinsonism (HCC) 10/26/2019   Osteoporosis 09/15/2019   Blister of great toe of left foot 08/19/2019   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   Moderate episode of recurrent major depressive disorder (HCC) 02/09/2019   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 02/09/2019   Shuffling gait 02/09/2019   Balance problems 02/09/2019   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   Orthostatic hypotension 02/01/2019   Dizziness 02/01/2019   Primary osteoarthritis of right foot 09/25/2018   Snoring 09/25/2018   Night terrors, adult 09/25/2018   Multiple falls 09/25/2018   Metatarsalgia of left foot 08/20/2018   Chronic foot pain, right 08/20/2018   Left hip pain 04/10/2017   History of shingles 04/08/2017   Adenomatous polyp 04/08/2017   Pure hypercholesterolemia 04/08/2017   History of kidney stones 04/08/2017   Essential hypertension 04/08/2017   Depression, recurrent (HCC) 04/08/2017   Anxiety 04/08/2017   Greater trochanteric bursitis of left hip 04/08/2017    PCP: Araceli Knight, PA-C   REFERRING PROVIDER: Shirline Dover, DO   REFERRING DIAG:  G20.A1 (ICD-10-CM) - Parkinson's disease without dyskinesia or fluctuating manifestations (HCC)  M41.9 (ICD-10-CM) - Scoliosis, unspecified scoliosis type, unspecified spinal region    THERAPY DIAG:  Cramp and spasm  Pain in right hip  Other low back pain  Unsteadiness on feet  Other abnormalities of gait and mobility  Muscle weakness (generalized)  Other symptoms and signs involving the nervous system  Balance disorder  Rationale for Evaluation and Treatment: Rehabilitation  ONSET DATE: one year  SUBJECTIVE:   SUBJECTIVE STATEMENT: Patient reports her hip bursitis is really bad and plans on making an appointment for an injection. Patient states her back has been feeling much better since DN at last visit.    PERTINENT HISTORY: Osteoporosis (cannot take meds), PD, R L 4/5 laminectomy, HTN, scoliosis, h/o falls PAIN:  Are you having pain? Yes: NPRS scale: 0 today up to 8/10 Pain location: R lumbar Pain description: stabbing spasm Aggravating factors: loading dishwasher, standing in bathroom Relieving factors: sitting  Are you having pain? Yes: NPRS scale: 8/10 Pain location: R hip near greater trochanter Pain description: soreness Aggravating factors: walking, stairs and sit to stand Relieving factors: Ibuprofen and lidocaine  patch  PRECAUTIONS: Fall and Other: OSTEOPOROSIS  RED FLAGS: None   WEIGHT BEARING RESTRICTIONS: No  FALLS:  Has patient fallen in last 6 months? 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  DIAGNOSTIC FINDINGS: none  PATIENT SURVEYS:  The Patient-Specific Functional Scale  Initial:  I am going to ask you to identify up to 3 important activities that you are unable to do or are having difficulty with as a result of this problem.  Today are there any activities that you are unable to do or having difficulty with because of this?  (Patient shown scale and patient rated each activity)  Follow up: When you first came in you had difficulty performing these activities.  Today do you still have difficulty?  Patient-Specific activity scoring scheme (Point to one number):  0 1 2 3 4 5 6 7 8 9  10 Unable                                                                                                          Able to perform To perform                                                                                                     activity at the same Activity  Level as before                                                                                                                       Injury or problem  Activity       Cooking/kitchen chores                                                                          Initial:      0                 follow up:  2.         Walking                                                                      Initial:         4              follow up:  3.         Sit to stand                                                                    Initial:        4               follow up:     COGNITION: Overall cognitive status: Within functional limits for tasks assessed     SENSATION: Not tested  MUSCLE LENGTH: B piriformis and HS tightness, R QL and lumbar  POSTURE: rounded shoulders, forward head, flexed trunk , and left thoracic R lumbar scoliosis (convexity)  PALPATION: Marked pain R QL, and R gluteals near greater trochanter  LUMBAR ROM:   Active  A/PROM  eval  Flexion WFL  Extension WFL  Right lateral flexion   Left lateral flexion   Right rotation NT  Left rotation NT   (Blank rows = not tested)   LOWER EXTREMITY ROM: WFL for tasks assessed  Active ROM Right eval Left eval  Hip flexion    Hip extension    Hip abduction    Hip adduction    Hip internal rotation 15* 20  Hip external rotation    Knee flexion    Knee extension  Ankle dorsiflexion    Ankle plantarflexion    Ankle inversion    Ankle eversion     (Blank rows = not tested) * cramping  LOWER EXTREMITY MMT: tested in sitting  MMT Right eval Left eval  Hip flexion 4 4  Hip extension    Hip abduction 4 4+  Hip adduction 5 5  Hip internal rotation    Hip external rotation    Knee flexion    Knee extension 4+ 4+  Ankle dorsiflexion 4+ 4+  Ankle plantarflexion    Ankle inversion    Ankle eversion      (Blank rows = not tested)   FUNCTIONAL TESTS:  5 times sit to stand: 25.08 sec  GAIT: Distance walked: 20 Assistive device utilized: None Level of assistance: Complete Independence Comments: decreased heel strike, flexed knees and hips, short step length                                                                                                                                [facility] Adult PT Treatment:                                                DATE: 04/05/2024 Therapeutic Exercise: Side Lying: Clamshells + RTB (R) x10 Bent knee hip abd + RTB --> discontinued d/t pain Supine figure 4 stretch (R) Wide leg LTR --> stretching L side Therapeutic Activity: Berg Balance Sleep positioning --> towel placement for postural support in supine and side lying    TREATMENT DATE:   03/31/24 See pt ed and HEP  Standing lumbar extension with back to high mat table Manual:  Trigger Point Dry Needling  Initial Treatment: Pt instructed on Dry Needling rational, procedures, and possible side effects. Pt instructed to expect mild to moderate muscle soreness later in the day and/or into the next day.  Pt instructed in methods to reduce muscle soreness. Pt instructed to continue prescribed HEP. Patient was educated on signs and symptoms of infection and other risk factors and advised to seek medical attention should they occur.  Patient verbalized understanding of these instructions and education.   Patient Verbal Consent Given: Yes Education Handout Provided: Yes Muscles Treated: R QL Electrical Stimulation Performed: No Treatment Response/Outcome: Utilized skilled palpation to identify bony landmarks and trigger points.  Able to illicit twitch response and muscle elongation.  Soft tissue mobilization to R lumbar/QL  following DN to further promote tissue elongation and decreased pain.       PATIENT EDUCATION:  Education details: PT eval findings, anticipated POC, initial HEP, and  DN rational, procedure, outcomes, potential side effects, and recommended post-treatment exercises/activity  Person educated: Patient Education method: Explanation, Demonstration, and Handouts Education comprehension: verbalized understanding and returned demonstration  HOME EXERCISE PROGRAM: Access Code: [email or web address] URL: [email or web address] Date: 04/05/2024  Prepared by: Sims Duck  Exercises - Standing Lumbar Extension with Counter  - 3 x daily - 7 x weekly - 1 sets - 10 reps - Supine Figure 4 Piriformis Stretch  - 1 x daily - 7 x weekly - 3 sets - 10 reps (R only) - Supine Lower Trunk Rotation  - 1 x daily - 7 x weekly - 3 sets - 10 reps (L side stretch)  ASSESSMENT:  CLINICAL IMPRESSION: Figure 4 stretch in supine triggered low back spasms with L leg in external hip rotation; symptoms alleviated with dynamic hip internal rotation stretch on L side. Hip strengthening progressed on R side with resisted clamshells; hip abd variation exacerbated lateral R hip pain and exercise discontinued. Patient scored 43/56 on Berg Balance test; patient had most difficulty with NBOS, single leg stability and dynamic balance with runs and weight shifting.   EVAL: Patient is a 83 y.o. female who was seen today for physical therapy evaluation and treatment for muscles spasms secondary to her scoliosis and for balance deficits secondary to PD. She also reports new onset of R hip pain 5 days ago. The back spasms and hip pain are limiting her ability to walk, perform sit to stand, and perform standing kitchen and bathroom chores. She denies any falls in the past 6 months, but reports that her balance is "not good". She uses a walker intermittently, mainly at the EOD and if she gets up during the night. She has deficits in flexibility and strength as well as posture and balance. She will benefit from skilled PT to address these deficits.  She experienced back spasm when attempting to do a supine hip  stretch, so initial trial of DN performed on R QL with good twitch response. Patient reported decreased pain at end of session and was able to move without spasm.  OBJECTIVE IMPAIRMENTS: Abnormal gait, decreased activity tolerance, decreased balance, decreased ROM, decreased strength, increased muscle spasms, impaired flexibility, postural dysfunction, and pain.   ACTIVITY LIMITATIONS: bending, sitting, standing, squatting, sleeping, stairs, transfers, bed mobility, bathing, dressing, hygiene/grooming, and locomotion level  PARTICIPATION LIMITATIONS: meal prep, cleaning, laundry, shopping, and community activity  PERSONAL FACTORS: Age, Fitness, Time since onset of injury/illness/exacerbation, and 3+ comorbidities: OP, PD, scoliosis, HTN  are also affecting patient's functional outcome.   REHAB POTENTIAL: Good  CLINICAL DECISION MAKING: Unstable/unpredictable  EVALUATION COMPLEXITY: High   GOALS: Goals reviewed with patient? Yes  SHORT TERM GOALS: Target date: 04/28/24 Decreased back spasms by >= 50% to improve QOL Baseline: Goal status: INITIAL  2.  Decreased hip pain by 50% with steps, transfers and walking. Baseline:  Goal status: INITIAL  3.  Balance assessment completed at second visit and goals updated. Baseline:  Goal status: INITIAL   LONG TERM GOALS: Target date: 05/26/24  Patient able to perform ADLS without back spasms. Baseline:  Goal status: INITIAL  2.  Pt able to walk and climb stairs without R hip pain. Baseline:  Goal status: INITIAL  3.  Improved 5XSTS by 5 seconds demonstrating improved strength and decreasing risk for falls. Baseline: 25.08 seconds. Goal status: INITIAL  4.  Improved BERG score by 8 points showing functional improvement Baseline: TBD Goal status: INITIAL  5.  Improve PSFS by 2-3 points  Baseline: see objective Goal status: INITIAL     PLAN:  PT FREQUENCY: 2x/week  PT DURATION: 8 weeks  PLANNED INTERVENTIONS: 97164- PT  Re-evaluation, 97110-Therapeutic exercises, 97530- Therapeutic activity, 97112- Neuromuscular re-education, 97535- Self Care, 86578- Manual therapy,  04540- Gait training, [phone]- Electrical stimulation (unattended), [phone]- Ionotophoresis 4mg /ml Dexamethasone , Patient/Family education, Balance training, Stair training, Taping, Dry Needling, Joint mobilization, Cryotherapy, and Moist heat  PLAN FOR NEXT SESSION: Progress hip stretching, LE strength, gait and balance. PRECAUTION: OSTEOPOROSIS.   Sims Duck, PTA 04/05/2024, 11:45 AM

## 2024-04-05 NOTE — Addendum Note (Signed)
 Addended by: Manette Section on: 04/05/2024 02:27 PM   Modules accepted: Orders

## 2024-04-08 ENCOUNTER — Encounter: Payer: Self-pay | Admitting: Physical Therapy

## 2024-04-08 ENCOUNTER — Ambulatory Visit: Admitting: Physical Therapy

## 2024-04-08 DIAGNOSIS — M5459 Other low back pain: Secondary | ICD-10-CM

## 2024-04-08 DIAGNOSIS — R252 Cramp and spasm: Secondary | ICD-10-CM | POA: Diagnosis not present

## 2024-04-08 DIAGNOSIS — M25551 Pain in right hip: Secondary | ICD-10-CM

## 2024-04-08 DIAGNOSIS — R2681 Unsteadiness on feet: Secondary | ICD-10-CM

## 2024-04-08 NOTE — Therapy (Signed)
 OUTPATIENT PHYSICAL THERAPY LUMBAR AND LE TREATMENT   Patient Name: Chelsey Estes MRN: [id number] DOB:Feb 09, 1941, 83 y.o., female Today's Date: 04/08/2024  END OF SESSION:  PT End of Session - 04/08/24 1105     Visit Number 3    Date for PT Re-Evaluation 05/26/24    Authorization Type MCR    Progress Note Due on Visit 10    PT Start Time 1105    PT Stop Time 1150    PT Time Calculation (min) 45 min    Activity Tolerance Patient tolerated treatment well    Behavior During Therapy WFL for tasks assessed/performed              Past Medical History:  Diagnosis Date   Anxiety    Arthritis    Depression    Glaucoma    History of kidney stones    7 lithrotripsy   Hyperlipidemia    Hypertension    Parkinson disease (HCC)    Past Surgical History:  Procedure Laterality Date   ABDOMINAL HYSTERECTOMY     ANTERIOR (CYSTOCELE) AND POSTERIOR REPAIR (RECTOCELE) WITH XENFORM GRAFT AND SACROSPINOUS FIXATION     CATARACT EXTRACTION, BILATERAL     INGUINAL HERNIA REPAIR Left    kidney stones     LUMBAR LAMINECTOMY/DECOMPRESSION MICRODISCECTOMY Right 10/12/2021   Procedure: Right Lumbar four-five Laminectomy for facet/synovial cyst;  Surgeon: Isadora Mar, MD;  Location: [facility] OR;  Service: Neurosurgery;  Laterality: Right;   Patient Active Problem List   Diagnosis Date Noted   Dysuria 02/25/2024   Leukocytes in urine 02/25/2024   Pruritus 10/17/2023   Heart palpitations 07/14/2023   History of hypokalemia 07/14/2023   Vitamin D  deficiency 12/02/2022   Kidney stone 11/22/2022   Tinnitus of left ear 07/03/2022   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 07/03/2022   Decreased GFR 04/17/2022   Medication management 04/16/2022   Synovial cyst of lumbar facet joint 09/19/2021   DDD (degenerative disc disease), lumbar 04/11/2021   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 03/09/2021   Palpitations 09/05/2020   SOB  (shortness of breath) 09/05/2020   Chest pain 09/05/2020   Mixed hyperlipidemia 06/06/2020   Primary insomnia 06/06/2020   Primary parkinsonism (HCC) 10/26/2019   Osteoporosis 09/15/2019   Blister of great toe of left foot 08/19/2019   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   Moderate episode of recurrent major depressive disorder (HCC) 02/09/2019   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 02/09/2019   Shuffling gait 02/09/2019   Balance problems 02/09/2019   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   Orthostatic hypotension 02/01/2019   Dizziness 02/01/2019   Primary osteoarthritis of right foot 09/25/2018   Snoring 09/25/2018   Night terrors, adult 09/25/2018   Multiple falls 09/25/2018   Metatarsalgia of left foot 08/20/2018   Chronic foot pain, right 08/20/2018   Left hip pain 04/10/2017   History of shingles 04/08/2017   Adenomatous polyp 04/08/2017   Pure hypercholesterolemia 04/08/2017   History of kidney stones 04/08/2017   Essential hypertension 04/08/2017   Depression, recurrent (HCC) 04/08/2017   Anxiety 04/08/2017   Greater trochanteric bursitis of left hip 04/08/2017    PCP: Araceli Knight, PA-C   REFERRING PROVIDER: Shirline Dover, DO   REFERRING DIAG:  G20.A1 (ICD-10-CM) - Parkinson's disease without dyskinesia or fluctuating manifestations (HCC)  M41.9 (ICD-10-CM) - Scoliosis, unspecified scoliosis type, unspecified spinal region    THERAPY DIAG:  Cramp and spasm  Pain in right  hip  Other low back pain  Unsteadiness on feet  Rationale for Evaluation and Treatment: Rehabilitation  ONSET DATE: one year  SUBJECTIVE:   SUBJECTIVE STATEMENT: Have not had spasm in upper back. I was taking laundry out to the dryer and then felt a little spasm in the belt line, but never fully kicked in. I want to walk but I can't get off the ground if fall down. I would like to work on that.   PERTINENT HISTORY: Osteoporosis (cannot take meds), PD, R L 4/5 laminectomy,  HTN, scoliosis, h/o falls PAIN:  Are you having pain? Yes: NPRS scale: 0 today up to 8/10 Pain location: R lumbar Pain description: stabbing spasm Aggravating factors: loading dishwasher, standing in bathroom Relieving factors: sitting  Are you having pain? Yes: NPRS scale: 8/10 Pain location: R hip near greater trochanter Pain description: soreness Aggravating factors: walking, stairs and sit to stand Relieving factors: Ibuprofen and lidocaine  patch  PRECAUTIONS: Fall and Other: OSTEOPOROSIS  RED FLAGS: None   WEIGHT BEARING RESTRICTIONS: No  FALLS:  Has patient fallen in last 6 months? 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  DIAGNOSTIC FINDINGS: none  PATIENT SURVEYS:  The Patient-Specific Functional Scale  Initial:  I am going to ask you to identify up to 3 important activities that you are unable to do or are having difficulty with as a result of this problem.  Today are there any activities that you are unable to do or having difficulty with because of this?  (Patient shown scale and patient rated each activity)  Follow up: When you first came in you had difficulty performing these activities.  Today do you still have difficulty?  Patient-Specific activity scoring scheme (Point to one number):  0 1 2 3 4 5 6 7 8 9  10 Unable                                                                                                          Able to perform To perform                                                                                                    activity at  the same Activity  Level as before                                                                                                                       Injury or problem  Activity       Cooking/kitchen chores                                                                          Initial:      0                 follow up:  2.         Walking                                                                      Initial:         4              follow up:  3.         Sit to stand                                                                    Initial:        4               follow up:     COGNITION: Overall cognitive status: Within functional limits for tasks assessed     SENSATION: Not tested  MUSCLE LENGTH: B piriformis and HS tightness, R QL and lumbar  POSTURE: rounded shoulders, forward head, flexed trunk , and left thoracic R lumbar scoliosis (convexity)  PALPATION: Marked pain R QL, and R gluteals near greater trochanter  LUMBAR ROM:   Active  A/PROM  eval  Flexion WFL  Extension WFL  Right lateral flexion   Left lateral flexion   Right rotation NT  Left rotation NT   (Blank rows = not tested)   LOWER EXTREMITY ROM: WFL for tasks assessed  Active ROM Right eval Left eval  Hip flexion    Hip extension    Hip abduction    Hip adduction    Hip internal rotation 15* 20  Hip external rotation    Knee flexion    Knee extension  Ankle dorsiflexion    Ankle plantarflexion    Ankle inversion    Ankle eversion     (Blank rows = not tested) * cramping  LOWER EXTREMITY MMT: tested in sitting  MMT Right eval Left eval  Hip flexion 4 4  Hip extension    Hip abduction 4 4+  Hip adduction 5 5  Hip internal rotation    Hip external rotation    Knee flexion    Knee extension 4+ 4+  Ankle dorsiflexion 4+ 4+  Ankle plantarflexion    Ankle inversion    Ankle eversion     (Blank rows = not tested)   FUNCTIONAL TESTS:  5 times sit to stand:  25.08 sec  GAIT: Distance walked: 20 Assistive device utilized: None Level of assistance: Complete Independence Comments: decreased heel strike, flexed knees and hips, short step length      [facility] Adult PT Treatment:                                                DATE: 04/08/2024 Therapeutic Exercise: Nustep L 5 x 5 SKTC 2 x 20 sec B -feels in R lumbar Supine C stretch x 2 min Supine fig 4 starts to cause spasm Reviewed sitting option for fig 4 Wide leg LTR --> stretching L side Side Lying: Clamshells x10 - blocking pt from rolling bwd Bent knee hip abd x 10 feels in lateral leg - blocking pt from rolling bwd x 2 feels in glute med  Manual Therapy:   Trigger Point Dry Needling  Subsequent Treatment: Instructions provided previously at initial dry needling treatment.   Patient Verbal Consent Given: Yes Education Handout Provided: Previously Provided Muscles Treated: R QL and gluteals Electrical Stimulation Performed: No Treatment Response/Outcome: Utilized skilled palpation to identify bony landmarks and trigger points.  Able to illicit twitch response and muscle elongation.  Soft tissue mobilization to R gluteals and lumbar  following DN to further promote tissue elongation and decreased pain.    Self-Care: Discussion of obtaining 4 W RW to allow pt to walk in her neighborhood. Examples given to pt online.                                                                                                                      [facility] Adult PT Treatment:                                                DATE: 04/05/2024 Therapeutic Exercise: Side Lying: Clamshells + RTB (R) x10 Bent knee hip abd + RTB --> discontinued d/t pain Supine figure 4 stretch (R) Wide leg LTR --> stretching L side Therapeutic Activity: Berg Balance Sleep positioning --> towel placement for postural support  in supine and side lying    TREATMENT DATE:   03/31/24 See pt ed and HEP  Standing lumbar extension  with back to high mat table Manual:  Trigger Point Dry Needling  Initial Treatment: Pt instructed on Dry Needling rational, procedures, and possible side effects. Pt instructed to expect mild to moderate muscle soreness later in the day and/or into the next day.  Pt instructed in methods to reduce muscle soreness. Pt instructed to continue prescribed HEP. Patient was educated on signs and symptoms of infection and other risk factors and advised to seek medical attention should they occur.  Patient verbalized understanding of these instructions and education.   Patient Verbal Consent Given: Yes Education Handout Provided: Yes Muscles Treated: R QL Electrical Stimulation Performed: No Treatment Response/Outcome: Utilized skilled palpation to identify bony landmarks and trigger points.  Able to illicit twitch response and muscle elongation.  Soft tissue mobilization to R lumbar/QL  following DN to further promote tissue elongation and decreased pain.       PATIENT EDUCATION:  Education details: PT eval findings, anticipated POC, initial HEP, and DN rational, procedure, outcomes, potential side effects, and recommended post-treatment exercises/activity  Person educated: Patient Education method: Explanation, Demonstration, and Handouts Education comprehension: verbalized understanding and returned demonstration  HOME EXERCISE PROGRAM: Access Code: [email or web address] URL: [email or web address] Date: 04/08/2024 Prepared by: Concha Deed  Exercises - Standing Lumbar Extension with Counter  - 3 x daily - 7 x weekly - 1 sets - 10 reps - Supine Figure 4 Piriformis Stretch  - 1 x daily - 7 x weekly - 3 sets - 10 reps - Supine Lower Trunk Rotation  - 1 x daily - 7 x weekly - 3 sets - 10 reps - QL Stretch Supine  - 1 x daily - 7 x weekly - 3 sets - 10 reps  ASSESSMENT:  CLINICAL IMPRESSION: Patient reports no spasm in the upper low back any longer, but started feeling the lower R spasm  returning when doing laundry yesterday. She felt a good stretch in supine with moving both straight legs to the left and was able to tolerate for 2 minutes. Spasms addressed with DN/MT with excellent response. Patient would like to work on floor to stand transfers without UE support in the future.   EVAL: Patient is a 83 y.o. female who was seen today for physical therapy evaluation and treatment for muscles spasms secondary to her scoliosis and for balance deficits secondary to PD. She also reports new onset of R hip pain 5 days ago. The back spasms and hip pain are limiting her ability to walk, perform sit to stand, and perform standing kitchen and bathroom chores. She denies any falls in the past 6 months, but reports that her balance is "not good". She uses a walker intermittently, mainly at the EOD and if she gets up during the night. She has deficits in flexibility and strength as well as posture and balance. She will benefit from skilled PT to address these deficits.  She experienced back spasm when attempting to do a supine hip stretch, so initial trial of DN performed on R QL with good twitch response. Patient reported decreased pain at end of session and was able to move without spasm.  OBJECTIVE IMPAIRMENTS: Abnormal gait, decreased activity tolerance, decreased balance, decreased ROM, decreased strength, increased muscle spasms, impaired flexibility, postural dysfunction, and pain.   ACTIVITY LIMITATIONS: bending, sitting, standing, squatting, sleeping, stairs, transfers, bed mobility, bathing, dressing, hygiene/grooming, and locomotion level  PARTICIPATION  LIMITATIONS: meal prep, cleaning, laundry, shopping, and community activity  PERSONAL FACTORS: Age, Fitness, Time since onset of injury/illness/exacerbation, and 3+ comorbidities: OP, PD, scoliosis, HTN  are also affecting patient's functional outcome.   REHAB POTENTIAL: Good  CLINICAL DECISION MAKING:  Unstable/unpredictable  EVALUATION COMPLEXITY: High   GOALS: Goals reviewed with patient? Yes  SHORT TERM GOALS: Target date: 04/28/24 Decreased back spasms by >= 50% to improve QOL Baseline: Goal status: INITIAL  2.  Decreased hip pain by 50% with steps, transfers and walking. Baseline:  Goal status: INITIAL  3.  Balance assessment completed at second visit and goals updated. Baseline:  Goal status: INITIAL   LONG TERM GOALS: Target date: 05/26/24  Patient able to perform ADLS without back spasms. Baseline:  Goal status: INITIAL  2.  Pt able to walk and climb stairs without R hip pain. Baseline:  Goal status: INITIAL  3.  Improved 5XSTS by 5 seconds demonstrating improved strength and decreasing risk for falls. Baseline: 25.08 seconds. Goal status: INITIAL  4.  Improved BERG score by 8 points showing functional improvement Baseline: TBD Goal status: INITIAL  5.  Improve PSFS by 2-3 points  Baseline: see objective Goal status: INITIAL     PLAN:  PT FREQUENCY: 2x/week  PT DURATION: 8 weeks  PLANNED INTERVENTIONS: 97164- PT Re-evaluation, 97110-Therapeutic exercises, 97530- Therapeutic activity, 97112- Neuromuscular re-education, 97535- Self Care, [id number]- Manual therapy, [phone]- Gait training, [phone]- Electrical stimulation (unattended), [phone]- Ionotophoresis 4mg /ml Dexamethasone , Patient/Family education, Balance training, Stair training, Taping, Dry Needling, Joint mobilization, Cryotherapy, and Moist heat  PLAN FOR NEXT SESSION: Work on floor to stand transfer, Progress hip stretching, LE strength, gait and balance. PRECAUTION: OSTEOPOROSIS.   Jinx Mourning, PT  04/08/2024, 12:03 PM

## 2024-04-12 ENCOUNTER — Ambulatory Visit

## 2024-04-12 DIAGNOSIS — M5459 Other low back pain: Secondary | ICD-10-CM

## 2024-04-12 DIAGNOSIS — R2681 Unsteadiness on feet: Secondary | ICD-10-CM

## 2024-04-12 DIAGNOSIS — R29818 Other symptoms and signs involving the nervous system: Secondary | ICD-10-CM

## 2024-04-12 DIAGNOSIS — M6281 Muscle weakness (generalized): Secondary | ICD-10-CM

## 2024-04-12 DIAGNOSIS — R252 Cramp and spasm: Secondary | ICD-10-CM | POA: Diagnosis not present

## 2024-04-12 DIAGNOSIS — R2689 Other abnormalities of gait and mobility: Secondary | ICD-10-CM

## 2024-04-12 DIAGNOSIS — M25551 Pain in right hip: Secondary | ICD-10-CM

## 2024-04-12 NOTE — Therapy (Addendum)
 OUTPATIENT PHYSICAL THERAPY LUMBAR AND LE TREATMENT   Patient Name: Chelsey Estes MRN: [id number] DOB:01/08/1941, 83 y.o., female Today's Date: 04/12/2024  END OF SESSION:  PT End of Session - 04/12/24 1152     Visit Number 4    Date for PT Re-Evaluation 05/26/24    Authorization Type MCR    Progress Note Due on Visit 10    PT Start Time 1152   pt arrived late   PT Stop Time 1235    PT Time Calculation (min) 43 min    Activity Tolerance Patient tolerated treatment well    Behavior During Therapy WFL for tasks assessed/performed            Past Medical History:  Diagnosis Date   Anxiety    Arthritis    Depression    Glaucoma    History of kidney stones    7 lithrotripsy   Hyperlipidemia    Hypertension    Parkinson disease (HCC)    Past Surgical History:  Procedure Laterality Date   ABDOMINAL HYSTERECTOMY     ANTERIOR (CYSTOCELE) AND POSTERIOR REPAIR (RECTOCELE) WITH XENFORM GRAFT AND SACROSPINOUS FIXATION     CATARACT EXTRACTION, BILATERAL     INGUINAL HERNIA REPAIR Left    kidney stones     LUMBAR LAMINECTOMY/DECOMPRESSION MICRODISCECTOMY Right 10/12/2021   Procedure: Right Lumbar four-five Laminectomy for facet/synovial cyst;  Surgeon: Isadora Mar, MD;  Location: [facility] OR;  Service: Neurosurgery;  Laterality: Right;   Patient Active Problem List   Diagnosis Date Noted   Dysuria 02/25/2024   Leukocytes in urine 02/25/2024   Pruritus 10/17/2023   Heart palpitations 07/14/2023   History of hypokalemia 07/14/2023   Vitamin D  deficiency 12/02/2022   Kidney stone 11/22/2022   Tinnitus of left ear 07/03/2022   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 07/03/2022   Decreased GFR 04/17/2022   Medication management 04/16/2022   Synovial cyst of lumbar facet joint 09/19/2021   DDD (degenerative disc disease), lumbar 04/11/2021   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 03/09/2021   Palpitations 09/05/2020    SOB (shortness of breath) 09/05/2020   Chest pain 09/05/2020   Mixed hyperlipidemia 06/06/2020   Primary insomnia 06/06/2020   Primary parkinsonism (HCC) 10/26/2019   Osteoporosis 09/15/2019   Blister of great toe of left foot 08/19/2019   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   Moderate episode of recurrent major depressive disorder (HCC) 02/09/2019   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 02/09/2019   Shuffling gait 02/09/2019   Balance problems 02/09/2019   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   Orthostatic hypotension 02/01/2019   Dizziness 02/01/2019   Primary osteoarthritis of right foot 09/25/2018   Snoring 09/25/2018   Night terrors, adult 09/25/2018   Multiple falls 09/25/2018   Metatarsalgia of left foot 08/20/2018   Chronic foot pain, right 08/20/2018   Left hip pain 04/10/2017   History of shingles 04/08/2017   Adenomatous polyp 04/08/2017   Pure hypercholesterolemia 04/08/2017   History of kidney stones 04/08/2017   Essential hypertension 04/08/2017   Depression, recurrent (HCC) 04/08/2017   Anxiety 04/08/2017   Greater trochanteric bursitis of left hip 04/08/2017    PCP: Araceli Knight, PA-C   REFERRING PROVIDER: Shirline Dover, DO   REFERRING DIAG:  G20.A1 (ICD-10-CM) - Parkinson's disease without dyskinesia or fluctuating manifestations (HCC)  M41.9 (ICD-10-CM) - Scoliosis, unspecified scoliosis type, unspecified spinal region    THERAPY DIAG:  Cramp and spasm  Pain  in right hip  Other low back pain  Unsteadiness on feet  Other abnormalities of gait and mobility  Muscle weakness (generalized)  Other symptoms and signs involving the nervous system  Balance disorder  Rationale for Evaluation and Treatment: Rehabilitation  ONSET DATE: one year  SUBJECTIVE:   SUBJECTIVE STATEMENT: Patient reports she fell Saturday going into her house, states the storm door caught her foot. Patient states her shoulders are sore from fall; states her hip is  doing better and her back is "so-so". Patient states she was able to get up from her fall by crawling over to boxes and using them to help her get to standing on her own.   PERTINENT HISTORY: Osteoporosis (cannot take meds), PD, R L 4/5 laminectomy, HTN, scoliosis, h/o falls PAIN:  Are you having pain? Yes: NPRS scale: 0 today up to 8/10 Pain location: R lumbar Pain description: stabbing spasm Aggravating factors: loading dishwasher, standing in bathroom Relieving factors: sitting  Are you having pain? Yes: NPRS scale: 8/10 Pain location: R hip near greater trochanter Pain description: soreness Aggravating factors: walking, stairs and sit to stand Relieving factors: Ibuprofen and lidocaine  patch  PRECAUTIONS: Fall and Other: OSTEOPOROSIS  RED FLAGS: None   WEIGHT BEARING RESTRICTIONS: No  FALLS:  Has patient fallen in last 6 months? 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  DIAGNOSTIC FINDINGS: none  PATIENT SURVEYS:  The Patient-Specific Functional Scale  Initial:  I am going to ask you to identify up to 3 important activities that you are unable to do or are having difficulty with as a result of this problem.  Today are there any activities that you are unable to do or having difficulty with because of this?  (Patient shown scale and patient rated each activity)  Follow up: When you first came in you had difficulty performing these activities.  Today do you still have difficulty?  Patient-Specific activity scoring scheme (Point to one  number):  0 1 2 3 4 5 6 7 8 9  10 Unable                                                                                                          Able to perform To perform  activity at the same Activity         Level as before                                                                                                                       Injury or problem  Activity       Cooking/kitchen chores                                                                          Initial:      0                 follow up:  2.         Walking                                                                      Initial:         4              follow up:  3.         Sit to stand                                                                    Initial:        4               follow up:     COGNITION: Overall cognitive status: Within functional limits for tasks assessed     SENSATION: Not tested  MUSCLE LENGTH: B piriformis and HS tightness, R QL and lumbar  POSTURE: rounded shoulders, forward head, flexed trunk , and left thoracic R lumbar scoliosis (convexity)  PALPATION: Marked pain R QL, and R gluteals near greater trochanter  LUMBAR ROM:   Active  A/PROM  eval  Flexion WFL  Extension WFL  Right lateral flexion   Left lateral flexion   Right rotation NT  Left rotation NT   (Blank rows = not tested)   LOWER EXTREMITY ROM: WFL for tasks assessed  Active ROM Right eval Left eval  Hip flexion    Hip extension    Hip abduction    Hip adduction    Hip internal rotation 15* 20  Hip external rotation    Knee flexion    Knee extension    Ankle dorsiflexion    Ankle plantarflexion    Ankle inversion    Ankle eversion     (Blank rows = not tested) * cramping  LOWER EXTREMITY MMT: tested in sitting  MMT Right eval Left eval  Hip flexion 4 4  Hip extension    Hip abduction  4 4+  Hip adduction 5 5  Hip internal rotation    Hip external rotation    Knee flexion    Knee extension 4+ 4+  Ankle dorsiflexion 4+ 4+  Ankle plantarflexion    Ankle inversion    Ankle eversion     (Blank rows = not tested)   FUNCTIONAL TESTS:  5 times sit to stand: 25.08 sec  GAIT: Distance walked: 20 Assistive device utilized: None Level of assistance: Complete Independence Comments: decreased heel strike, flexed knees and hips, short step length   [facility] Adult PT Treatment:                                                DATE: 04/12/2024 Therapeutic Exercise: Supine: Wide leg LTR --> L side only Seated: clamshells + blue TB R hip IR (ball b/w knees) Standing: Resisted R lateral step out + YTB crossed at ankles Resisted hip abd (R) + YTB crossed at ankles Neuromuscular re-ed: Seated: Unilateral clamshell + blue TB Hip abd isometric press with gait belt Therapeutic Activity: Step up/down alternating feet: 4" --> 6" step leading with Rt leg Straw breathing    [facility] Adult PT Treatment:                                                DATE: 04/08/2024 Therapeutic Exercise: Nustep L 5 x 5 SKTC 2 x 20 sec B -feels in R lumbar Supine C stretch x 2 min Supine fig 4 starts to cause spasm Reviewed sitting option for fig 4 Wide leg LTR --> stretching L side Side Lying: Clamshells x10 - blocking pt from rolling bwd Bent knee hip abd x 10 feels in lateral leg - blocking pt from rolling bwd x 2 feels in glute med  Manual Therapy:   Trigger Point Dry Needling  Subsequent Treatment: Instructions provided previously at initial dry needling treatment.   Patient Verbal Consent Given: Yes Education Handout Provided: Previously Provided Muscles Treated: R QL and gluteals Electrical Stimulation Performed: No Treatment Response/Outcome: Utilized skilled palpation to identify bony landmarks and trigger points.  Able to illicit twitch response and muscle elongation.  Soft tissue  mobilization to R gluteals and lumbar  following DN to further promote tissue elongation and decreased pain.    Self-Care: Discussion of obtaining 4 W RW to allow pt to walk in her neighborhood. Examples given to pt online.  [facility] Adult PT Treatment:                                                DATE: 04/05/2024 Therapeutic Exercise: Side Lying: Clamshells + RTB (R) x10 Bent knee hip abd + RTB --> discontinued d/t pain Supine figure 4 stretch (R) Wide leg LTR --> stretching L side Therapeutic Activity: Berg Balance Sleep positioning --> towel placement for postural support in supine and side lying   PATIENT EDUCATION:  Education details: PT eval findings, anticipated POC, initial HEP, and DN rational, procedure, outcomes, potential side effects, and recommended post-treatment exercises/activity  Person educated: Patient Education method: Explanation, Demonstration, and Handouts Education comprehension: verbalized understanding and returned demonstration  HOME EXERCISE PROGRAM: Access Code: [email or web address] URL: [email or web address] Date: 04/12/2024 Prepared by: Sims Duck  Exercises - Standing Lumbar Extension with Counter  - 3 x daily - 7 x weekly - 1 sets - 10 reps - Supine Figure 4 Piriformis Stretch  - 1 x daily - 7 x weekly - 3 sets - 10 reps - Supine Lower Trunk Rotation  - 1 x daily - 7 x weekly - 3 sets - 10 reps - QL Stretch Supine  - 1 x daily - 7 x weekly - 3 sets - 10 reps - Seated Isometric Hip Abduction with Belt  - 1 x daily - 7 x weekly - 1-2 sets - 10 reps - 5 sec hold  ASSESSMENT:  CLINICAL IMPRESSION: Floor to standing transfer training deferred due to bilateral shoulder soreness from fall over the weekend. Hip strengthening continued with seated isometrics and standing resisted abduction. Noted weakness with weigh bearing on R LE  noted during progression from 4" to 6" step.   EVAL: Patient is a 83 y.o. female who was seen today for physical therapy evaluation and treatment for muscles spasms secondary to her scoliosis and for balance deficits secondary to PD. She also reports new onset of R hip pain 5 days ago. The back spasms and hip pain are limiting her ability to walk, perform sit to stand, and perform standing kitchen and bathroom chores. She denies any falls in the past 6 months, but reports that her balance is "not good". She uses a walker intermittently, mainly at the EOD and if she gets up during the night. She has deficits in flexibility and strength as well as posture and balance. She will benefit from skilled PT to address these deficits.  She experienced back spasm when attempting to do a supine hip stretch, so initial trial of DN performed on R QL with good twitch response. Patient reported decreased pain at end of session and was able to move without spasm.  OBJECTIVE IMPAIRMENTS: Abnormal gait, decreased activity tolerance, decreased balance, decreased ROM, decreased strength, increased muscle spasms, impaired flexibility, postural dysfunction, and pain.   ACTIVITY LIMITATIONS: bending, sitting, standing, squatting, sleeping, stairs, transfers, bed mobility, bathing, dressing, hygiene/grooming, and locomotion level  PARTICIPATION LIMITATIONS: meal prep, cleaning, laundry, shopping, and community activity  PERSONAL FACTORS: Age, Fitness, Time since onset of injury/illness/exacerbation, and 3+ comorbidities: OP, PD, scoliosis, HTN  are also affecting patient's functional outcome.   REHAB POTENTIAL: Good  CLINICAL DECISION MAKING: Unstable/unpredictable  EVALUATION COMPLEXITY: High   GOALS: Goals reviewed with patient? Yes  SHORT TERM GOALS: Target date: 04/28/24 Decreased back spasms by >= 50% to improve QOL Baseline: Goal status:  INITIAL  2.  Decreased hip pain by 50% with steps, transfers and  walking. Baseline:  Goal status: INITIAL  3.  Balance assessment completed at second visit and goals updated. Baseline:  Goal status: MET   LONG TERM GOALS: Target date: 05/26/24  Patient able to perform ADLS without back spasms. Baseline:  Goal status: INITIAL  2.  Pt able to walk and climb stairs without R hip pain. Baseline:  Goal status: INITIAL  3.  Improved 5XSTS by 5 seconds demonstrating improved strength and decreasing risk for falls. Baseline: 25.08 seconds. Goal status: INITIAL  4.  Improved BERG score by 8 points showing functional improvement Baseline: 43/56 Goal status: INITIAL  5.  Improve PSFS by 2-3 points  Baseline: see objective Goal status: INITIAL     PLAN:  PT FREQUENCY: 2x/week  PT DURATION: 8 weeks  PLANNED INTERVENTIONS: 97164- PT Re-evaluation, 97110-Therapeutic exercises, 97530- Therapeutic activity, 97112- Neuromuscular re-education, 97535- Self Care, [id number]- Manual therapy, [phone]- Gait training, [phone]- Electrical stimulation (unattended), [phone]- Ionotophoresis 4mg /ml Dexamethasone , Patient/Family education, Balance training, Stair training, Taping, Dry Needling, Joint mobilization, Cryotherapy, and Moist heat  PLAN FOR NEXT SESSION: Work on floor to stand transfer, Progress hip stretching, glute med strengthening, LE strength, gait and balance. PRECAUTION: OSTEOPOROSIS.   Sims Duck, PTA 04/12/2024, 12:39 PM

## 2024-04-14 ENCOUNTER — Other Ambulatory Visit: Payer: Self-pay | Admitting: Physician Assistant

## 2024-04-14 DIAGNOSIS — F5101 Primary insomnia: Secondary | ICD-10-CM

## 2024-04-15 ENCOUNTER — Ambulatory Visit: Attending: Neurology | Admitting: Physical Therapy

## 2024-04-15 ENCOUNTER — Encounter: Payer: Self-pay | Admitting: Physical Therapy

## 2024-04-15 DIAGNOSIS — M6281 Muscle weakness (generalized): Secondary | ICD-10-CM | POA: Diagnosis present

## 2024-04-15 DIAGNOSIS — M25551 Pain in right hip: Secondary | ICD-10-CM | POA: Insufficient documentation

## 2024-04-15 DIAGNOSIS — R29818 Other symptoms and signs involving the nervous system: Secondary | ICD-10-CM | POA: Diagnosis present

## 2024-04-15 DIAGNOSIS — R252 Cramp and spasm: Secondary | ICD-10-CM | POA: Diagnosis present

## 2024-04-15 DIAGNOSIS — R2681 Unsteadiness on feet: Secondary | ICD-10-CM | POA: Diagnosis present

## 2024-04-15 DIAGNOSIS — M5459 Other low back pain: Secondary | ICD-10-CM | POA: Diagnosis present

## 2024-04-15 DIAGNOSIS — R2689 Other abnormalities of gait and mobility: Secondary | ICD-10-CM | POA: Insufficient documentation

## 2024-04-15 NOTE — Therapy (Signed)
 OUTPATIENT PHYSICAL THERAPY LUMBAR AND LE TREATMENT   Patient Name: Chelsey Estes MRN: [id number] DOB:11-28-1941, 83 y.o., female Today's Date: 04/15/2024  END OF SESSION:  PT End of Session - 04/15/24 1104     Visit Number 5    Date for PT Re-Evaluation 05/26/24    Authorization Type MCR    PT Start Time 1105    PT Stop Time 1144    PT Time Calculation (min) 39 min    Activity Tolerance Patient tolerated treatment well    Behavior During Therapy WFL for tasks assessed/performed            Past Medical History:  Diagnosis Date   Anxiety    Arthritis    Depression    Glaucoma    History of kidney stones    7 lithrotripsy   Hyperlipidemia    Hypertension    Parkinson disease (HCC)    Past Surgical History:  Procedure Laterality Date   ABDOMINAL HYSTERECTOMY     ANTERIOR (CYSTOCELE) AND POSTERIOR REPAIR (RECTOCELE) WITH XENFORM GRAFT AND SACROSPINOUS FIXATION     CATARACT EXTRACTION, BILATERAL     INGUINAL HERNIA REPAIR Left    kidney stones     LUMBAR LAMINECTOMY/DECOMPRESSION MICRODISCECTOMY Right 10/12/2021   Procedure: Right Lumbar four-five Laminectomy for facet/synovial cyst;  Surgeon: Isadora Mar, MD;  Location: [facility] OR;  Service: Neurosurgery;  Laterality: Right;   Patient Active Problem List   Diagnosis Date Noted   Dysuria 02/25/2024   Leukocytes in urine 02/25/2024   Pruritus 10/17/2023   Heart palpitations 07/14/2023   History of hypokalemia 07/14/2023   Vitamin D  deficiency 12/02/2022   Kidney stone 11/22/2022   Tinnitus of left ear 07/03/2022   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 07/03/2022   Decreased GFR 04/17/2022   Medication management 04/16/2022   Synovial cyst of lumbar facet joint 09/19/2021   DDD (degenerative disc disease), lumbar 04/11/2021   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 03/09/2021   Palpitations 09/05/2020   SOB (shortness of breath) 09/05/2020   Chest pain  09/05/2020   Mixed hyperlipidemia 06/06/2020   Primary insomnia 06/06/2020   Primary parkinsonism (HCC) 10/26/2019   Osteoporosis 09/15/2019   Blister of great toe of left foot 08/19/2019   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   Moderate episode of recurrent major depressive disorder (HCC) 02/09/2019   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 02/09/2019   Shuffling gait 02/09/2019   Balance problems 02/09/2019   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   Orthostatic hypotension 02/01/2019   Dizziness 02/01/2019   Primary osteoarthritis of right foot 09/25/2018   Snoring 09/25/2018   Night terrors, adult 09/25/2018   Multiple falls 09/25/2018   Metatarsalgia of left foot 08/20/2018   Chronic foot pain, right 08/20/2018   Left hip pain 04/10/2017   History of shingles 04/08/2017   Adenomatous polyp 04/08/2017   Pure hypercholesterolemia 04/08/2017   History of kidney stones 04/08/2017   Essential hypertension 04/08/2017   Depression, recurrent (HCC) 04/08/2017   Anxiety 04/08/2017   Greater trochanteric bursitis of left hip 04/08/2017    PCP: Araceli Knight, PA-C   REFERRING PROVIDER: Shirline Dover, DO   REFERRING DIAG:  G20.A1 (ICD-10-CM) - Parkinson's disease without dyskinesia or fluctuating manifestations (HCC)  M41.9 (ICD-10-CM) - Scoliosis, unspecified scoliosis type, unspecified spinal region    THERAPY DIAG:  Cramp and spasm  Pain in right hip  Other low back pain  Unsteadiness on feet  Rationale for Evaluation and Treatment: Rehabilitation  ONSET DATE: one year  SUBJECTIVE:   SUBJECTIVE STATEMENT:  I can't stand up straight. I had a lot of pain getting dressed this morning. Heated seats in car help.  PERTINENT HISTORY: Osteoporosis (cannot take meds), PD, R L 4/5 laminectomy, HTN, scoliosis, h/o falls PAIN:  Are you having pain? Yes: NPRS scale: 0 today up to 8/10 Pain location: R lumbar Pain description: stabbing spasm Aggravating factors: loading  dishwasher, standing in bathroom Relieving factors: sitting  Are you having pain? Yes: NPRS scale: 8/10 Pain location: R hip near greater trochanter Pain description: soreness Aggravating factors: walking, stairs and sit to stand Relieving factors: Ibuprofen and lidocaine  patch  PRECAUTIONS: Fall and Other: OSTEOPOROSIS  RED FLAGS: None   WEIGHT BEARING RESTRICTIONS: No  FALLS:  Has patient fallen in last 6 months? 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  DIAGNOSTIC FINDINGS: none  PATIENT SURVEYS:  The Patient-Specific Functional Scale  Initial:  I am going to ask you to identify up to 3 important activities that you are unable to do or are having difficulty with as a result of this problem.  Today are there any activities that you are unable to do or having difficulty with because of this?  (Patient shown scale and patient rated each activity)  Follow up: When you first came in you had difficulty performing these activities.  Today do you still have difficulty?  Patient-Specific activity scoring scheme (Point to one number):  0 1 2 3 4 5 6 7 8 9  10 Unable                                                                                                          Able to perform To perform                                                                                                    activity at the same Activity         Level as before  Injury or  problem  Activity       Cooking/kitchen chores                                                                          Initial:      0                 follow up:  2.         Walking                                                                      Initial:         4              follow up:  3.         Sit to stand                                                                    Initial:        4               follow up:     COGNITION: Overall cognitive status: Within functional limits for tasks assessed     SENSATION: Not tested  MUSCLE LENGTH: B piriformis and HS tightness, R QL and lumbar  POSTURE: rounded shoulders, forward head, flexed trunk , and left thoracic R lumbar scoliosis (convexity)  PALPATION: Marked pain R QL, and R gluteals near greater trochanter  LUMBAR ROM:   Active  A/PROM  eval  Flexion WFL  Extension WFL  Right lateral flexion   Left lateral flexion   Right rotation NT  Left rotation NT   (Blank rows = not tested)   LOWER EXTREMITY ROM: WFL for tasks assessed  Active ROM Right eval Left eval  Hip flexion    Hip extension    Hip abduction    Hip adduction    Hip internal rotation 15* 20  Hip external rotation    Knee flexion    Knee extension    Ankle dorsiflexion    Ankle plantarflexion    Ankle inversion    Ankle eversion     (Blank rows = not tested) * cramping  LOWER EXTREMITY MMT: tested in sitting  MMT Right eval Left eval  Hip flexion 4 4  Hip extension    Hip abduction 4 4+  Hip adduction 5 5  Hip internal rotation    Hip external rotation    Knee flexion    Knee extension 4+ 4+  Ankle dorsiflexion 4+ 4+  Ankle plantarflexion    Ankle inversion    Ankle eversion     (Blank rows = not tested)   FUNCTIONAL TESTS:  5 times  sit to stand: 25.08 sec  GAIT: Distance walked: 20 Assistive device utilized: None Level of assistance: Complete Independence Comments: decreased heel strike, flexed knees and hips,  short step length     [facility] Adult PT Treatment:                                                DATE: 04/15/2024 Therapeutic Exercise: Standing: Standing lumbar extension at plinth x 10 Resisted R lateral step out + YTB crossed at ankles   Resisted hip abd (R) + YTB crossed at ankles - cues for full knee ext  x 10, then 1x10 no band  Standing quad sets/TKE  Therapeutic Activity: Step up/down alternating feet: 4" --> 6" step leading with Rt leg Then S/L step up with contralateral HS curl x 10 B  4 inch Lateral step ups with B knee extension x 10 B 4 inch  Manual Therapy:   Trigger Point Dry Needling  Subsequent Treatment: Instructions provided previously at initial dry needling treatment.   Patient Verbal Consent Given: Yes Education Handout Provided: Previously Provided Muscles Treated: R gluteals Electrical Stimulation Performed: No Treatment Response/Outcome: Utilized skilled palpation to identify bony landmarks and trigger points.  Able to illicit twitch response and muscle elongation.  Soft tissue mobilization to R gluteals  following DN to further promote tissue elongation and decreased pain    [facility] Adult PT Treatment:                                                DATE: 04/12/2024 Therapeutic Exercise: Supine: Wide leg LTR --> L side only Seated: clamshells + blue TB R hip IR (ball b/w knees) Standing: Resisted R lateral step out + YTB crossed at ankles Resisted hip abd (R) + YTB crossed at ankles Neuromuscular re-ed: Seated: Unilateral clamshell + blue TB Hip abd isometric press with gait belt Therapeutic Activity: Step up/down alternating feet: 4" --> 6" step leading with Rt leg Straw breathing   [facility] Adult PT Treatment:                                                DATE: 04/08/2024 Therapeutic Exercise: Nustep L 5 x 5 SKTC 2 x 20 sec B -feels in R lumbar Supine C stretch x 2 min Supine fig 4 starts to cause spasm Reviewed sitting option for fig 4 Wide leg  LTR --> stretching L side Side Lying: Clamshells x10 - blocking pt from rolling bwd Bent knee hip abd x 10 feels in lateral leg - blocking pt from rolling bwd x 2 feels in glute med  Manual Therapy:   Trigger Point Dry Needling  Subsequent Treatment: Instructions provided previously at initial dry needling treatment.   Patient Verbal Consent Given: Yes Education Handout Provided: Previously Provided Muscles Treated: R QL and gluteals Electrical Stimulation Performed: No Treatment Response/Outcome: Utilized skilled palpation to identify bony landmarks and trigger points.  Able to illicit twitch response and muscle elongation.  Soft tissue mobilization to R gluteals and lumbar  following DN to further promote tissue elongation and decreased pain.  Self-Care: Discussion of obtaining 4 W RW to allow pt to walk in her neighborhood. Examples given to pt online.                                                                                                                      [facility] Adult PT Treatment:                                                DATE: 04/05/2024 Therapeutic Exercise: Side Lying: Clamshells + RTB (R) x10 Bent knee hip abd + RTB --> discontinued d/t pain Supine figure 4 stretch (R) Wide leg LTR --> stretching L side Therapeutic Activity: Berg Balance Sleep positioning --> towel placement for postural support in supine and side lying   PATIENT EDUCATION:  Education details: PT eval findings, anticipated POC, initial HEP, and DN rational, procedure, outcomes, potential side effects, and recommended post-treatment exercises/activity  Person educated: Patient Education method: Explanation, Demonstration, and Handouts Education comprehension: verbalized understanding and returned demonstration  HOME EXERCISE PROGRAM: Access Code: [email or web address] URL: [email or web address] Date: 04/12/2024 Prepared by: Sims Duck  Exercises - Standing Lumbar Extension with  Counter  - 3 x daily - 7 x weekly - 1 sets - 10 reps - Supine Figure 4 Piriformis Stretch  - 1 x daily - 7 x weekly - 3 sets - 10 reps - Supine Lower Trunk Rotation  - 1 x daily - 7 x weekly - 3 sets - 10 reps - QL Stretch Supine  - 1 x daily - 7 x weekly - 3 sets - 10 reps - Seated Isometric Hip Abduction with Belt  - 1 x daily - 7 x weekly - 1-2 sets - 10 reps - 5 sec hold  ASSESSMENT:  CLINICAL IMPRESSION: Patient demonstrating decreased heel strike and shorter step length with gait today. With VC this improves. She demonstrates flexed hip/knees with all exercises today and we focused on engaging her quads during standing TE. She demonstrates full extension and good strength when cued. Able to do 6 inch step ups without difficulty, but did report R hip pain. Excellent response to DN/MT in gluteals. She continues to demonstrate potential for improvement and would benefit from continued skilled therapy to address impairments.  She is in the process of trying to rent a rollator to test it out before purchasing one.  EVAL: Patient is a 83 y.o. female who was seen today for physical therapy evaluation and treatment for muscles spasms secondary to her scoliosis and for balance deficits secondary to PD. She also reports new onset of R hip pain 5 days ago. The back spasms and hip pain are limiting her ability to walk, perform sit to stand, and perform standing kitchen and bathroom chores. She denies any falls in the past 6 months, but reports that her balance is "not good". She uses a walker intermittently,  mainly at the EOD and if she gets up during the night. She has deficits in flexibility and strength as well as posture and balance. She will benefit from skilled PT to address these deficits.  She experienced back spasm when attempting to do a supine hip stretch, so initial trial of DN performed on R QL with good twitch response. Patient reported decreased pain at end of session and was able to move without  spasm.  OBJECTIVE IMPAIRMENTS: Abnormal gait, decreased activity tolerance, decreased balance, decreased ROM, decreased strength, increased muscle spasms, impaired flexibility, postural dysfunction, and pain.   ACTIVITY LIMITATIONS: bending, sitting, standing, squatting, sleeping, stairs, transfers, bed mobility, bathing, dressing, hygiene/grooming, and locomotion level  PARTICIPATION LIMITATIONS: meal prep, cleaning, laundry, shopping, and community activity  PERSONAL FACTORS: Age, Fitness, Time since onset of injury/illness/exacerbation, and 3+ comorbidities: OP, PD, scoliosis, HTN  are also affecting patient's functional outcome.   REHAB POTENTIAL: Good  CLINICAL DECISION MAKING: Unstable/unpredictable  EVALUATION COMPLEXITY: High   GOALS: Goals reviewed with patient? Yes  SHORT TERM GOALS: Target date: 04/28/24 Decreased back spasms by >= 50% to improve QOL Baseline: Goal status: INITIAL  2.  Decreased hip pain by 50% with steps, transfers and walking. Baseline:  Goal status: INITIAL  3.  Balance assessment completed at second visit and goals updated. Baseline:  Goal status: MET   LONG TERM GOALS: Target date: 05/26/24  Patient able to perform ADLS without back spasms. Baseline:  Goal status: INITIAL  2.  Pt able to walk and climb stairs without R hip pain. Baseline:  Goal status: INITIAL  3.  Improved 5XSTS by 5 seconds demonstrating improved strength and decreasing risk for falls. Baseline: 25.08 seconds. Goal status: INITIAL  4.  Improved BERG score by 8 points showing functional improvement Baseline: 43/56 Goal status: INITIAL  5.  Improve PSFS by 2-3 points  Baseline: see objective Goal status: INITIAL     PLAN:  PT FREQUENCY: 2x/week  PT DURATION: 8 weeks  PLANNED INTERVENTIONS: 97164- PT Re-evaluation, 97110-Therapeutic exercises, 97530- Therapeutic activity, [id number]- Neuromuscular re-education, 97535- Self Care, [id number]- Manual therapy, [id number]-  Gait training, [phone]- Electrical stimulation (unattended), [phone]- Ionotophoresis 4mg /ml Dexamethasone , Patient/Family education, Balance training, Stair training, Taping, Dry Needling, Joint mobilization, Cryotherapy, and Moist heat  PLAN FOR NEXT SESSION: Give gait festination handout. Work on floor to stand transfer, Progress hip stretching, glute med strengthening, LE strength, gait and balance. PRECAUTION: OSTEOPOROSIS.    Jinx Mourning, PT  04/15/2024, 12:46 PM

## 2024-04-19 NOTE — Therapy (Signed)
 OUTPATIENT PHYSICAL THERAPY LUMBAR AND LE TREATMENT   Patient Name: Florrie Fritsche MRN: [id number] DOB:[birth year], 83 y.o., female Today's Date: 04/20/2024  END OF SESSION:  PT End of Session - 04/20/24 1019     Visit Number 6    Date for PT Re-Evaluation 05/26/24    Authorization Type MCR    Progress Note Due on Visit 10    PT Start Time 1019    PT Stop Time 1102    PT Time Calculation (min) 43 min    Activity Tolerance Patient tolerated treatment well    Behavior During Therapy WFL for tasks assessed/performed             Past Medical History:  Diagnosis Date   Anxiety    Arthritis    Depression    Glaucoma    History of kidney stones    7 lithrotripsy   Hyperlipidemia    Hypertension    Parkinson disease (HCC)    Past Surgical History:  Procedure Laterality Date   ABDOMINAL HYSTERECTOMY     ANTERIOR (CYSTOCELE) AND POSTERIOR REPAIR (RECTOCELE) WITH XENFORM GRAFT AND SACROSPINOUS FIXATION     CATARACT EXTRACTION, BILATERAL     INGUINAL HERNIA REPAIR Left    kidney stones     LUMBAR LAMINECTOMY/DECOMPRESSION MICRODISCECTOMY Right 10/12/2021   Procedure: Right Lumbar four-five Laminectomy for facet/synovial cyst;  Surgeon: Isadora Mar, MD;  Location: [facility] OR;  Service: Neurosurgery;  Laterality: Right;   Patient Active Problem List   Diagnosis Date Noted   Dysuria 02/25/2024   Leukocytes in urine 02/25/2024   Pruritus 10/17/2023   Heart palpitations 07/14/2023   History of hypokalemia 07/14/2023   Vitamin D  deficiency 12/02/2022   Kidney stone 11/22/2022   Tinnitus of left ear 07/03/2022   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 07/03/2022   Decreased GFR 04/17/2022   Medication management 04/16/2022   Synovial cyst of lumbar facet joint 09/19/2021   DDD (degenerative disc disease), lumbar 04/11/2021   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 03/09/2021   Palpitations 09/05/2020   SOB (shortness  of breath) 09/05/2020   Chest pain 09/05/2020   Mixed hyperlipidemia 06/06/2020   Primary insomnia 06/06/2020   Primary parkinsonism (HCC) 10/26/2019   Osteoporosis 09/15/2019   Blister of great toe of left foot 08/19/2019   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   Moderate episode of recurrent major depressive disorder (HCC) 02/09/2019   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 02/09/2019   Shuffling gait 02/09/2019   Balance problems 02/09/2019   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   Orthostatic hypotension 02/01/2019   Dizziness 02/01/2019   Primary osteoarthritis of right foot 09/25/2018   Snoring 09/25/2018   Night terrors, adult 09/25/2018   Multiple falls 09/25/2018   Metatarsalgia of left foot 08/20/2018   Chronic foot pain, right 08/20/2018   Left hip pain 04/10/2017   History of shingles 04/08/2017   Adenomatous polyp 04/08/2017   Pure hypercholesterolemia 04/08/2017   History of kidney stones 04/08/2017   Essential hypertension 04/08/2017   Depression, recurrent (HCC) 04/08/2017   Anxiety 04/08/2017   Greater trochanteric bursitis of left hip 04/08/2017    PCP: Araceli Knight, PA-C   REFERRING PROVIDER: Shirline Dover, DO   REFERRING DIAG:  G20.A1 (ICD-10-CM) - Parkinson's disease without dyskinesia or fluctuating manifestations (HCC)  M41.9 (ICD-10-CM) - Scoliosis, unspecified scoliosis type, unspecified spinal region    THERAPY DIAG:  Cramp and spasm  Pain in right hip  Other low back pain  Unsteadiness on feet  Other abnormalities of gait and mobility  Muscle weakness (generalized)  Rationale for Evaluation and Treatment: Rehabilitation  ONSET DATE: one year  SUBJECTIVE:   SUBJECTIVE STATEMENT:  Still planning to get a walker and is working on getting her hearing aids. I'm stiff in the morning, but when I do my stretches it feels good. The hip is doing better. If I sit too long it gets stiff again.  PERTINENT HISTORY: Osteoporosis (cannot  take meds), PD, R L 4/5 laminectomy, HTN, scoliosis, h/o falls PAIN:  Are you having pain? Yes: NPRS scale: 0 today up to 8/10 Pain location: R lumbar Pain description: stabbing spasm Aggravating factors: loading dishwasher, standing in bathroom Relieving factors: sitting  Are you having pain? Yes: NPRS scale: 0/10 Pain location: R hip near greater trochanter Pain description: soreness Aggravating factors: walking, stairs and sit to stand Relieving factors: Ibuprofen and lidocaine  patch  PRECAUTIONS: Fall and Other: OSTEOPOROSIS  RED FLAGS: None   WEIGHT BEARING RESTRICTIONS: No  FALLS:  Has patient fallen in last 6 months? 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  DIAGNOSTIC FINDINGS: none  PATIENT SURVEYS:  The Patient-Specific Functional Scale  Initial:  I am going to ask you to identify up to 3 important activities that you are unable to do or are having difficulty with as a result of this problem.  Today are there any activities that you are unable to do or having difficulty with because of this?  (Patient shown scale and patient rated each activity)  Follow up: When you first came in you had difficulty performing these activities.  Today do you still have difficulty?  Patient-Specific activity scoring scheme (Point to one number):  0 1 2 3 4 5 6 7 8 9  10 Unable                                                                                                          Able to perform To perform                                                                                                     activity at the same Activity  Level as before                                                                                                                       Injury or problem  Activity       Cooking/kitchen chores                                                                          Initial:      0                 follow up:  2.         Walking                                                                      Initial:         4              follow up:  3.         Sit to stand                                                                    Initial:        4               follow up:     COGNITION: Overall cognitive status: Within functional limits for tasks assessed     SENSATION: Not tested  MUSCLE LENGTH: B piriformis and HS tightness, R QL and lumbar  POSTURE: rounded shoulders, forward head, flexed trunk , and left thoracic R lumbar scoliosis (convexity)  PALPATION: Marked pain R QL, and R gluteals near greater trochanter  LUMBAR ROM:   Active  A/PROM  eval  Flexion WFL  Extension WFL  Right lateral flexion   Left lateral flexion   Right rotation NT  Left rotation NT   (Blank rows = not tested)   LOWER EXTREMITY ROM: WFL for tasks assessed  Active ROM Right eval Left eval  Hip flexion    Hip extension    Hip abduction    Hip adduction    Hip internal rotation 15* 20  Hip external rotation    Knee flexion    Knee extension  Ankle dorsiflexion    Ankle plantarflexion    Ankle inversion    Ankle eversion     (Blank rows = not tested) * cramping  LOWER EXTREMITY MMT: tested in sitting  MMT Right eval Left eval  Hip flexion 4 4  Hip extension    Hip abduction 4 4+  Hip adduction 5 5  Hip internal rotation    Hip external rotation    Knee flexion    Knee extension 4+ 4+  Ankle dorsiflexion 4+ 4+  Ankle plantarflexion    Ankle inversion    Ankle eversion     (Blank rows = not  tested)   FUNCTIONAL TESTS:  5 times sit to stand: 25.08 sec  04/20/24  13.35 sec GAIT: Distance walked: 20 Assistive device utilized: None Level of assistance: Complete Independence Comments: decreased heel strike, flexed knees and hips, short step length    [facility] Adult PT Treatment:                                                DATE: 04/20/2024 Give gait festination handout. Work on floor to stand transfer, Therapeutic Exercise: Nustep L5 x 6 min Supine fig 4 2x30 sec B Standing: Side stepping 2# at stairs with 2#AW Resisted hip abd, ext, marching, knee flexion  2#AW B  Toe taps on 6 and 8 inch steps x 10 ea B 2#AW  Therapeutic Activity: Sit to stand 2x 5 6 inch step up with TKE fwd and lateral  x 10 ea B Neurore-ed: Toe taps to colored cones - B using call out for color       [facility] Adult PT Treatment:                                                DATE: 04/15/2024 Therapeutic Exercise: Standing: Standing lumbar extension at plinth x 10 Resisted R lateral step out + YTB crossed at ankles   Resisted hip abd (R) + YTB crossed at ankles - cues for full knee ext  x 10, then 1x10 no band  Standing quad sets/TKE  Therapeutic Activity: Step up/down alternating feet: 4" --> 6" step leading with Rt leg Then S/L step up with contralateral HS curl x 10 B  4 inch Lateral step ups with B knee extension x 10 B 4 inch  Manual Therapy:   Trigger Point Dry Needling  Subsequent Treatment: Instructions provided previously at initial dry needling treatment.   Patient Verbal Consent Given: Yes Education Handout Provided: Previously Provided Muscles Treated: R gluteals Electrical Stimulation Performed: No Treatment Response/Outcome: Utilized skilled palpation to identify bony landmarks and trigger points.  Able to illicit twitch response and muscle elongation.  Soft tissue mobilization to R gluteals  following DN to further promote tissue elongation and decreased pain    [facility] Adult  PT Treatment:                                                DATE: 04/12/2024 Therapeutic Exercise: Supine: Wide leg LTR --> L side only Seated: clamshells + blue TB R hip IR (  ball b/w knees) Standing: Resisted R lateral step out + YTB crossed at ankles Resisted hip abd (R) + YTB crossed at ankles Neuromuscular re-ed: Seated: Unilateral clamshell + blue TB Hip abd isometric press with gait belt Therapeutic Activity: Step up/down alternating feet: 4" --> 6" step leading with Rt leg Straw breathing   [facility] Adult PT Treatment:                                                DATE: 04/08/2024 Therapeutic Exercise: Nustep L 5 x 5 SKTC 2 x 20 sec B -feels in R lumbar Supine C stretch x 2 min Supine fig 4 starts to cause spasm Reviewed sitting option for fig 4 Wide leg LTR --> stretching L side Side Lying: Clamshells x10 - blocking pt from rolling bwd Bent knee hip abd x 10 feels in lateral leg - blocking pt from rolling bwd x 2 feels in glute med  Manual Therapy:   Trigger Point Dry Needling  Subsequent Treatment: Instructions provided previously at initial dry needling treatment.   Patient Verbal Consent Given: Yes Education Handout Provided: Previously Provided Muscles Treated: R QL and gluteals Electrical Stimulation Performed: No Treatment Response/Outcome: Utilized skilled palpation to identify bony landmarks and trigger points.  Able to illicit twitch response and muscle elongation.  Soft tissue mobilization to R gluteals and lumbar  following DN to further promote tissue elongation and decreased pain.    Self-Care: Discussion of obtaining 4 W RW to allow pt to walk in her neighborhood. Examples given to pt online.                                                                                                                      [facility] Adult PT Treatment:                                                DATE: 04/05/2024 Therapeutic Exercise: Side Lying: Clamshells + RTB (R)  x10 Bent knee hip abd + RTB --> discontinued d/t pain Supine figure 4 stretch (R) Wide leg LTR --> stretching L side Therapeutic Activity: Berg Balance Sleep positioning --> towel placement for postural support in supine and side lying   PATIENT EDUCATION:  Education details: PT eval findings, anticipated POC, initial HEP, and DN rational, procedure, outcomes, potential side effects, and recommended post-treatment exercises/activity  Person educated: Patient Education method: Explanation, Demonstration, and Handouts Education comprehension: verbalized understanding and returned demonstration  HOME EXERCISE PROGRAM: Access Code: [email or web address] URL: [email or web address] Date: 04/12/2024 Prepared by: Sims Duck  Exercises - Standing Lumbar Extension with Counter  - 3 x daily - 7 x weekly - 1 sets - 10 reps - Supine Figure 4 Piriformis Stretch  - 1 x  daily - 7 x weekly - 3 sets - 10 reps - Supine Lower Trunk Rotation  - 1 x daily - 7 x weekly - 3 sets - 10 reps - QL Stretch Supine  - 1 x daily - 7 x weekly - 3 sets - 10 reps - Seated Isometric Hip Abduction with Belt  - 1 x daily - 7 x weekly - 1-2 sets - 10 reps - 5 sec hold  ASSESSMENT:  CLINICAL IMPRESSION: Patient is progressing with strengthening. She has met her 5XSTS goal. Her pain has also improved reporting 25% improvement with stairs. She still has marked tightness in her R hip. She did well with balance today using cones + cognitive component. She continues to demonstrate potential for improvement and would benefit from continued skilled therapy to address impairments.    EVAL: Patient is a 83 y.o. female who was seen today for physical therapy evaluation and treatment for muscles spasms secondary to her scoliosis and for balance deficits secondary to PD. She also reports new onset of R hip pain 5 days ago. The back spasms and hip pain are limiting her ability to walk, perform sit to stand, and perform standing  kitchen and bathroom chores. She denies any falls in the past 6 months, but reports that her balance is "not good". She uses a walker intermittently, mainly at the EOD and if she gets up during the night. She has deficits in flexibility and strength as well as posture and balance. She will benefit from skilled PT to address these deficits.  She experienced back spasm when attempting to do a supine hip stretch, so initial trial of DN performed on R QL with good twitch response. Patient reported decreased pain at end of session and was able to move without spasm.  OBJECTIVE IMPAIRMENTS: Abnormal gait, decreased activity tolerance, decreased balance, decreased ROM, decreased strength, increased muscle spasms, impaired flexibility, postural dysfunction, and pain.   ACTIVITY LIMITATIONS: bending, sitting, standing, squatting, sleeping, stairs, transfers, bed mobility, bathing, dressing, hygiene/grooming, and locomotion level  PARTICIPATION LIMITATIONS: meal prep, cleaning, laundry, shopping, and community activity  PERSONAL FACTORS: Age, Fitness, Time since onset of injury/illness/exacerbation, and 3+ comorbidities: OP, PD, scoliosis, HTN  are also affecting patient's functional outcome.   REHAB POTENTIAL: Good  CLINICAL DECISION MAKING: Unstable/unpredictable  EVALUATION COMPLEXITY: High   GOALS: Goals reviewed with patient? Yes  SHORT TERM GOALS: Target date: 04/28/24 Decreased back spasms by >= 50% to improve QOL Baseline: Goal status: IN PROGRESS 255 5/6  2.  Decreased hip pain by 50% with steps, transfers and walking. Baseline:  Goal status: INITIAL  3.  Balance assessment completed at second visit and goals updated. Baseline:  Goal status: MET   LONG TERM GOALS: Target date: 05/26/24  Patient able to perform ADLS without back spasms. Baseline:  Goal status: INITIAL  2.  Pt able to walk and climb stairs without R hip pain. Baseline:  Goal status: INITIAL  3.  Improved 5XSTS  by 5 seconds demonstrating improved strength and decreasing risk for falls. Baseline: 25.08 seconds. Goal status:MET 04/20/24 13.35 sec  4.  Improved BERG score by 8 points showing functional improvement Baseline: 43/56 Goal status: INITIAL  5.  Improve PSFS by 2-3 points  Baseline: see objective Goal status: INITIAL     PLAN:  PT FREQUENCY: 2x/week  PT DURATION: 8 weeks  PLANNED INTERVENTIONS: [id number]- PT Re-evaluation, 97110-Therapeutic exercises, 97530- Therapeutic activity, [id number]- Neuromuscular re-education, 97535- Self Care, [id number]- Manual therapy, [id number]- Gait training, [phone]  Electrical stimulation (unattended), [phone]- Ionotophoresis 4mg /ml Dexamethasone , Patient/Family education, Balance training, Stair training, Taping, Dry Needling, Joint mobilization, Cryotherapy, and Moist heat  PLAN FOR NEXT SESSION: Give gait festination handout. Work on floor to stand transfer, Work on Development worker, international aid. Progress hip stretching, glute med strengthening, LE strength, gait and balance. PRECAUTION: OSTEOPOROSIS.    Jinx Mourning, PT  04/20/2024, 11:06 AM

## 2024-04-20 ENCOUNTER — Encounter: Payer: Self-pay | Admitting: Physical Therapy

## 2024-04-20 ENCOUNTER — Ambulatory Visit: Payer: Self-pay | Admitting: Physical Therapy

## 2024-04-20 DIAGNOSIS — M5459 Other low back pain: Secondary | ICD-10-CM

## 2024-04-20 DIAGNOSIS — M25551 Pain in right hip: Secondary | ICD-10-CM

## 2024-04-20 DIAGNOSIS — R2689 Other abnormalities of gait and mobility: Secondary | ICD-10-CM

## 2024-04-20 DIAGNOSIS — M6281 Muscle weakness (generalized): Secondary | ICD-10-CM

## 2024-04-20 DIAGNOSIS — R252 Cramp and spasm: Secondary | ICD-10-CM

## 2024-04-20 DIAGNOSIS — R2681 Unsteadiness on feet: Secondary | ICD-10-CM

## 2024-04-21 NOTE — Therapy (Signed)
 OUTPATIENT PHYSICAL THERAPY LUMBAR AND LE TREATMENT   Patient Name: Chelsey Estes MRN: [id number] DOB:Apr 17, 1941, 83 y.o., female Today's Date: 04/22/2024  END OF SESSION:  PT End of Session - 04/22/24 1101     Visit Number 7    Date for PT Re-Evaluation 05/26/24    Authorization Type MCR    Progress Note Due on Visit 10    PT Start Time 1101    PT Stop Time 1145    PT Time Calculation (min) 44 min    Activity Tolerance Patient tolerated treatment well    Behavior During Therapy WFL for tasks assessed/performed              Past Medical History:  Diagnosis Date   Anxiety    Arthritis    Depression    Glaucoma    History of kidney stones    7 lithrotripsy   Hyperlipidemia    Hypertension    Parkinson disease (HCC)    Past Surgical History:  Procedure Laterality Date   ABDOMINAL HYSTERECTOMY     ANTERIOR (CYSTOCELE) AND POSTERIOR REPAIR (RECTOCELE) WITH XENFORM GRAFT AND SACROSPINOUS FIXATION     CATARACT EXTRACTION, BILATERAL     INGUINAL HERNIA REPAIR Left    kidney stones     LUMBAR LAMINECTOMY/DECOMPRESSION MICRODISCECTOMY Right 10/12/2021   Procedure: Right Lumbar four-five Laminectomy for facet/synovial cyst;  Surgeon: Isadora Mar, MD;  Location: [facility] OR;  Service: Neurosurgery;  Laterality: Right;   Patient Active Problem List   Diagnosis Date Noted   Dysuria 02/25/2024   Leukocytes in urine 02/25/2024   Pruritus 10/17/2023   Heart palpitations 07/14/2023   History of hypokalemia 07/14/2023   Vitamin D  deficiency 12/02/2022   Kidney stone 11/22/2022   Tinnitus of left ear 07/03/2022   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 07/03/2022   Decreased GFR 04/17/2022   Medication management 04/16/2022   Synovial cyst of lumbar facet joint 09/19/2021   DDD (degenerative disc disease), lumbar 04/11/2021   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 03/09/2021   Palpitations 09/05/2020   SOB (shortness  of breath) 09/05/2020   Chest pain 09/05/2020   Mixed hyperlipidemia 06/06/2020   Primary insomnia 06/06/2020   Primary parkinsonism (HCC) 10/26/2019   Osteoporosis 09/15/2019   Blister of great toe of left foot 08/19/2019   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   Moderate episode of recurrent major depressive disorder (HCC) 02/09/2019   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 02/09/2019   Shuffling gait 02/09/2019   Balance problems 02/09/2019   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   Orthostatic hypotension 02/01/2019   Dizziness 02/01/2019   Primary osteoarthritis of right foot 09/25/2018   Snoring 09/25/2018   Night terrors, adult 09/25/2018   Multiple falls 09/25/2018   Metatarsalgia of left foot 08/20/2018   Chronic foot pain, right 08/20/2018   Left hip pain 04/10/2017   History of shingles 04/08/2017   Adenomatous polyp 04/08/2017   Pure hypercholesterolemia 04/08/2017   History of kidney stones 04/08/2017   Essential hypertension 04/08/2017   Depression, recurrent (HCC) 04/08/2017   Anxiety 04/08/2017   Greater trochanteric bursitis of left hip 04/08/2017    PCP: Araceli Knight, PA-C   REFERRING PROVIDER: Shirline Dover, DO   REFERRING DIAG:  G20.A1 (ICD-10-CM) - Parkinson's disease without dyskinesia or fluctuating manifestations (HCC)  M41.9 (ICD-10-CM) - Scoliosis, unspecified scoliosis type, unspecified spinal region    THERAPY DIAG:  Cramp and spasm  Pain in right  hip  Other low back pain  Unsteadiness on feet  Other abnormalities of gait and mobility  Rationale for Evaluation and Treatment: Rehabilitation  ONSET DATE: one year  SUBJECTIVE:   SUBJECTIVE STATEMENT:  I was wobbly after last visit and had to use my walker for the rest of the day. I got better   PERTINENT HISTORY: Osteoporosis (cannot take meds), PD, R L 4/5 laminectomy, HTN, scoliosis, h/o falls PAIN:  Are you having pain? Yes: NPRS scale: 0 today up to 8/10 Pain location: R  lumbar Pain description: stabbing spasm Aggravating factors: loading dishwasher, standing in bathroom Relieving factors: sitting  Are you having pain? Yes: NPRS scale: 0/10 Pain location: R hip near greater trochanter Pain description: soreness Aggravating factors: walking, stairs and sit to stand Relieving factors: Ibuprofen and lidocaine  patch  PRECAUTIONS: Fall and Other: OSTEOPOROSIS  RED FLAGS: None   WEIGHT BEARING RESTRICTIONS: No  FALLS:  Has patient fallen in last 6 months? 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  DIAGNOSTIC FINDINGS: none  PATIENT SURVEYS:  The Patient-Specific Functional Scale  Initial:  I am going to ask you to identify up to 3 important activities that you are unable to do or are having difficulty with as a result of this problem.  Today are there any activities that you are unable to do or having difficulty with because of this?  (Patient shown scale and patient rated each activity)  Follow up: When you first came in you had difficulty performing these activities.  Today do you still have difficulty?  Patient-Specific activity scoring scheme (Point to one number):  0 1 2 3 4 5 6 7 8 9  10 Unable                                                                                                          Able to perform To perform                                                                                                    activity at the same Activity         Level as before  Injury or problem  Activity       Cooking/kitchen chores                                                                          Initial:      0                 follow up:  2.         Walking                                                                      Initial:         4              follow up:  3.         Sit to stand                                                                    Initial:        4               follow up:     COGNITION: Overall cognitive status: Within functional limits for tasks assessed     SENSATION: Not tested  MUSCLE LENGTH: B piriformis and HS tightness, R QL and lumbar  POSTURE: rounded shoulders, forward head, flexed trunk , and left thoracic R lumbar scoliosis (convexity)  PALPATION: Marked pain R QL, and R gluteals near greater trochanter  LUMBAR ROM:   Active  A/PROM  eval  Flexion WFL  Extension WFL  Right lateral flexion   Left lateral flexion   Right rotation NT  Left rotation NT   (Blank rows = not tested)   LOWER EXTREMITY ROM: WFL for tasks assessed  Active ROM Right eval Left eval  Hip flexion    Hip extension    Hip abduction    Hip adduction    Hip internal rotation 15* 20  Hip external rotation    Knee flexion    Knee extension    Ankle dorsiflexion    Ankle plantarflexion    Ankle inversion    Ankle eversion     (Blank rows = not tested) * cramping  LOWER EXTREMITY MMT: tested in sitting  MMT Right eval Left eval  Hip flexion 4 4  Hip extension    Hip abduction 4 4+  Hip adduction 5 5  Hip internal rotation    Hip external rotation    Knee flexion    Knee extension 4+ 4+  Ankle dorsiflexion 4+ 4+  Ankle plantarflexion    Ankle inversion    Ankle eversion     (Blank rows = not tested)   FUNCTIONAL TESTS:  5 times sit  to stand: 25.08 sec  04/20/24  13.35 sec GAIT: Distance walked: 20 Assistive device utilized: None Level of assistance:  Complete Independence Comments: decreased heel strike, flexed knees and hips, short step length    [facility] Adult PT Treatment:                                                DATE: 04/22/2024  Therapeutic Exercise: Nustep L5 x 6 min Standing: Resisted hip abd, ext, marching, knee flexion  no wt today B  Toe taps on  8 inch steps x 10 ea B   Therapeutic Activity: Worked on floor to stand transfers from mat Kneeling position to 1/2 kneel with mat support 2x5 B Sit to stand 1x7 4# wt Seated OH press unilaterally 1# wt 2x10 Seated  chest press 1# wt 2x10 Wall push up (hands at chest height) x 8    [facility] Adult PT Treatment:                                                DATE: 04/20/2024 Give gait festination handout. Work on floor to stand transfer, Therapeutic Exercise: Nustep L5 x 6 min Supine fig 4 2x30 sec B Standing: Side stepping 2# at stairs with 2#AW Resisted hip abd, ext, marching, knee flexion  2#AW B  Toe taps on 6 and 8 inch steps x 10 ea B 2#AW  Therapeutic Activity: Sit to stand 2x 5 6 inch step up with TKE fwd and lateral  x 10 ea B Neurore-ed: Toe taps to colored cones - B using call out for color       [facility] Adult PT Treatment:                                                DATE: 04/15/2024 Therapeutic Exercise: Standing: Standing lumbar extension at plinth x 10 Resisted R lateral step out + YTB crossed at ankles   Resisted hip abd (R) + YTB crossed at ankles - cues for full knee ext  x 10, then 1x10 no band  Standing quad sets/TKE  Therapeutic Activity: Step up/down alternating feet: 4" --> 6" step leading with Rt leg Then S/L step up with contralateral HS curl x 10 B  4 inch Lateral step ups with B knee extension x 10 B 4 inch  Manual Therapy:   Trigger Point Dry Needling  Subsequent Treatment: Instructions provided previously at initial dry needling treatment.   Patient Verbal Consent Given: Yes Education Handout Provided: Previously Provided Muscles  Treated: R gluteals Electrical Stimulation Performed: No Treatment Response/Outcome: Utilized skilled palpation to identify bony landmarks and trigger points.  Able to illicit twitch response and muscle elongation.  Soft tissue mobilization to R gluteals  following DN to further promote tissue elongation and decreased pain    [facility] Adult PT Treatment:  DATE: 04/12/2024 Therapeutic Exercise: Supine: Wide leg LTR --> L side only Seated: clamshells + blue TB R hip IR (ball b/w knees) Standing: Resisted R lateral step out + YTB crossed at ankles Resisted hip abd (R) + YTB crossed at ankles Neuromuscular re-ed: Seated: Unilateral clamshell + blue TB Hip abd isometric press with gait belt Therapeutic Activity: Step up/down alternating feet: 4" --> 6" step leading with Rt leg Straw breathing   [facility] Adult PT Treatment:                                                DATE: 04/08/2024 Therapeutic Exercise: Nustep L 5 x 5 SKTC 2 x 20 sec B -feels in R lumbar Supine C stretch x 2 min Supine fig 4 starts to cause spasm Reviewed sitting option for fig 4 Wide leg LTR --> stretching L side Side Lying: Clamshells x10 - blocking pt from rolling bwd Bent knee hip abd x 10 feels in lateral leg - blocking pt from rolling bwd x 2 feels in glute med  Manual Therapy:   Trigger Point Dry Needling  Subsequent Treatment: Instructions provided previously at initial dry needling treatment.   Patient Verbal Consent Given: Yes Education Handout Provided: Previously Provided Muscles Treated: R QL and gluteals Electrical Stimulation Performed: No Treatment Response/Outcome: Utilized skilled palpation to identify bony landmarks and trigger points.  Able to illicit twitch response and muscle elongation.  Soft tissue mobilization to R gluteals and lumbar  following DN to further promote tissue elongation and decreased pain.    Self-Care: Discussion of obtaining 4  W RW to allow pt to walk in her neighborhood. Examples given to pt online.                                                                                                                      [facility] Adult PT Treatment:                                                DATE: 04/05/2024 Therapeutic Exercise: Side Lying: Clamshells + RTB (R) x10 Bent knee hip abd + RTB --> discontinued d/t pain Supine figure 4 stretch (R) Wide leg LTR --> stretching L side Therapeutic Activity: Berg Balance Sleep positioning --> towel placement for postural support in supine and side lying   PATIENT EDUCATION:  Education details: PT eval findings, anticipated POC, initial HEP, and DN rational, procedure, outcomes, potential side effects, and recommended post-treatment exercises/activity  Person educated: Patient Education method: Explanation, Demonstration, and Handouts Education comprehension: verbalized understanding and returned demonstration  HOME EXERCISE PROGRAM: Access Code: [email or web address] URL: [email or web address] Date: 04/22/2024 Prepared by: Concha Deed  Exercises - Standing Lumbar Extension with Counter  - 3 x daily -  7 x weekly - 1 sets - 10 reps - Supine Figure 4 Piriformis Stretch  - 1 x daily - 7 x weekly - 3 sets - 10 reps - Supine Lower Trunk Rotation  - 1 x daily - 7 x weekly - 3 sets - 10 reps - QL Stretch Supine  - 1 x daily - 7 x weekly - 3 sets - 10 reps - Seated Isometric Hip Abduction with Belt  - 1 x daily - 7 x weekly - 1-2 sets - 10 reps - 5 sec hold - Wall Push Up  - 1 x daily - 3 x weekly - 2 sets - 10 reps  ASSESSMENT:  CLINICAL IMPRESSION: Ryllie reports that she was very "wobbly" after last session and needed to use her walker the rest of the day. We did not use ankle weights today with standing TE as she thought that may have contributed to her unsteadiness. We worked on floor to stand transfers from the mat which she is unable to do without use of object to help her  get into 1/2 kneel position. We worked further on strengthening  for the various steps of the transfer including UE strengthening.   EVAL: Patient is a 83 y.o. female who was seen today for physical therapy evaluation and treatment for muscles spasms secondary to her scoliosis and for balance deficits secondary to PD. She also reports new onset of R hip pain 5 days ago. The back spasms and hip pain are limiting her ability to walk, perform sit to stand, and perform standing kitchen and bathroom chores. She denies any falls in the past 6 months, but reports that her balance is "not good". She uses a walker intermittently, mainly at the EOD and if she gets up during the night. She has deficits in flexibility and strength as well as posture and balance. She will benefit from skilled PT to address these deficits.  She experienced back spasm when attempting to do a supine hip stretch, so initial trial of DN performed on R QL with good twitch response. Patient reported decreased pain at end of session and was able to move without spasm.  OBJECTIVE IMPAIRMENTS: Abnormal gait, decreased activity tolerance, decreased balance, decreased ROM, decreased strength, increased muscle spasms, impaired flexibility, postural dysfunction, and pain.   ACTIVITY LIMITATIONS: bending, sitting, standing, squatting, sleeping, stairs, transfers, bed mobility, bathing, dressing, hygiene/grooming, and locomotion level  PARTICIPATION LIMITATIONS: meal prep, cleaning, laundry, shopping, and community activity  PERSONAL FACTORS: Age, Fitness, Time since onset of injury/illness/exacerbation, and 3+ comorbidities: OP, PD, scoliosis, HTN are also affecting patient's functional outcome.   REHAB POTENTIAL: Good  CLINICAL DECISION MAKING: Unstable/unpredictable  EVALUATION COMPLEXITY: High   GOALS: Goals reviewed with patient? Yes  SHORT TERM GOALS: Target date: 04/28/24 Decreased back spasms by >= 50% to improve  QOL Baseline: Goal status: IN PROGRESS 25% 5/6  2.  Decreased hip pain by 50% with steps, transfers and walking. Baseline:  Goal status: INITIAL  3.  Balance assessment completed at second visit and goals updated. Baseline:  Goal status: MET   LONG TERM GOALS: Target date: 05/26/24  Patient able to perform ADLS without back spasms. Baseline:  Goal status: INITIAL  2.  Pt able to walk and climb stairs without R hip pain. Baseline:  Goal status: INITIAL  3.  Improved 5XSTS by 5 seconds demonstrating improved strength and decreasing risk for falls. Baseline: 25.08 seconds. Goal status:MET 04/20/24 13.35 sec  4.  Improved BERG score by  8 points showing functional improvement Baseline: 43/56 Goal status: INITIAL  5.  Improve PSFS by 2-3 points  Baseline: see objective Goal status: INITIAL     PLAN:  PT FREQUENCY: 2x/week  PT DURATION: 8 weeks  PLANNED INTERVENTIONS: 97164- PT Re-evaluation, 97110-Therapeutic exercises, 97530- Therapeutic activity, 97112- Neuromuscular re-education, 97535- Self Care, 16109- Manual therapy, [phone]- Gait training, [phone]- Electrical stimulation (unattended), [phone]- Ionotophoresis 4mg /ml Dexamethasone , Patient/Family education, Balance training, Stair training, Taping, Dry Needling, Joint mobilization, Cryotherapy, and Moist heat  PLAN FOR NEXT SESSION: Continue to Work on floor to stand transfers, UE strength to assist with this. Work on balance. Progress hip stretching, glute med strengthening, LE strength, gait and balance. PRECAUTION: OSTEOPOROSIS.    Jinx Mourning, PT  04/22/2024, 1:03 PM

## 2024-04-22 ENCOUNTER — Encounter: Payer: Self-pay | Admitting: Physical Therapy

## 2024-04-22 ENCOUNTER — Ambulatory Visit: Payer: Self-pay | Admitting: Physical Therapy

## 2024-04-22 DIAGNOSIS — R2689 Other abnormalities of gait and mobility: Secondary | ICD-10-CM

## 2024-04-22 DIAGNOSIS — R252 Cramp and spasm: Secondary | ICD-10-CM | POA: Diagnosis not present

## 2024-04-22 DIAGNOSIS — M5459 Other low back pain: Secondary | ICD-10-CM

## 2024-04-22 DIAGNOSIS — M25551 Pain in right hip: Secondary | ICD-10-CM

## 2024-04-22 DIAGNOSIS — R2681 Unsteadiness on feet: Secondary | ICD-10-CM

## 2024-05-03 ENCOUNTER — Ambulatory Visit

## 2024-05-03 DIAGNOSIS — R252 Cramp and spasm: Secondary | ICD-10-CM

## 2024-05-03 DIAGNOSIS — R2689 Other abnormalities of gait and mobility: Secondary | ICD-10-CM

## 2024-05-03 DIAGNOSIS — M6281 Muscle weakness (generalized): Secondary | ICD-10-CM

## 2024-05-03 DIAGNOSIS — M25551 Pain in right hip: Secondary | ICD-10-CM

## 2024-05-03 DIAGNOSIS — R29818 Other symptoms and signs involving the nervous system: Secondary | ICD-10-CM

## 2024-05-03 DIAGNOSIS — M5459 Other low back pain: Secondary | ICD-10-CM

## 2024-05-03 DIAGNOSIS — R2681 Unsteadiness on feet: Secondary | ICD-10-CM

## 2024-05-03 NOTE — Therapy (Signed)
 OUTPATIENT PHYSICAL THERAPY LUMBAR AND LE TREATMENT   Patient Name: Chelsey Estes MRN: [id number] DOB:[birth year], 83 y.o., female Today's Date: 05/03/2024  END OF SESSION:  PT End of Session - 05/03/24 1320     Visit Number 8    Date for PT Re-Evaluation 05/26/24    Authorization Type MCR    Progress Note Due on Visit 10    PT Start Time 1320    PT Stop Time 1400    PT Time Calculation (min) 40 min    Activity Tolerance Patient tolerated treatment well    Behavior During Therapy WFL for tasks assessed/performed            Past Medical History:  Diagnosis Date   Anxiety    Arthritis    Depression    Glaucoma    History of kidney stones    7 lithrotripsy   Hyperlipidemia    Hypertension    Parkinson disease (HCC)    Past Surgical History:  Procedure Laterality Date   ABDOMINAL HYSTERECTOMY     ANTERIOR (CYSTOCELE) AND POSTERIOR REPAIR (RECTOCELE) WITH XENFORM GRAFT AND SACROSPINOUS FIXATION     CATARACT EXTRACTION, BILATERAL     INGUINAL HERNIA REPAIR Left    kidney stones     LUMBAR LAMINECTOMY/DECOMPRESSION MICRODISCECTOMY Right 10/12/2021   Procedure: Right Lumbar four-five Laminectomy for facet/synovial cyst;  Surgeon: Isadora Mar, MD;  Location: [facility] OR;  Service: Neurosurgery;  Laterality: Right;   Patient Active Problem List   Diagnosis Date Noted   Dysuria 02/25/2024   Leukocytes in urine 02/25/2024   Pruritus 10/17/2023   Heart palpitations 07/14/2023   History of hypokalemia 07/14/2023   Vitamin D  deficiency 12/02/2022   Kidney stone 11/22/2022   Tinnitus of left ear 07/03/2022   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 07/03/2022   Decreased GFR 04/17/2022   Medication management 04/16/2022   Synovial cyst of lumbar facet joint 09/19/2021   DDD (degenerative disc disease), lumbar 04/11/2021   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 03/09/2021   Palpitations 09/05/2020   SOB (shortness of  breath) 09/05/2020   Chest pain 09/05/2020   Mixed hyperlipidemia 06/06/2020   Primary insomnia 06/06/2020   Primary parkinsonism (HCC) 10/26/2019   Osteoporosis 09/15/2019   Blister of great toe of left foot 08/19/2019   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   Moderate episode of recurrent major depressive disorder (HCC) 02/09/2019   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 02/09/2019   Shuffling gait 02/09/2019   Balance problems 02/09/2019   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   Orthostatic hypotension 02/01/2019   Dizziness 02/01/2019   Primary osteoarthritis of right foot 09/25/2018   Snoring 09/25/2018   Night terrors, adult 09/25/2018   Multiple falls 09/25/2018   Metatarsalgia of left foot 08/20/2018   Chronic foot pain, right 08/20/2018   Left hip pain 04/10/2017   History of shingles 04/08/2017   Adenomatous polyp 04/08/2017   Pure hypercholesterolemia 04/08/2017   History of kidney stones 04/08/2017   Essential hypertension 04/08/2017   Depression, recurrent (HCC) 04/08/2017   Anxiety 04/08/2017   Greater trochanteric bursitis of left hip 04/08/2017    PCP: Araceli Knight, PA-C   REFERRING PROVIDER: Shirline Dover, DO   REFERRING DIAG:  G20.A1 (ICD-10-CM) - Parkinson's disease without dyskinesia or fluctuating manifestations (HCC)  M41.9 (ICD-10-CM) - Scoliosis, unspecified scoliosis type, unspecified spinal region    THERAPY DIAG:  Cramp and spasm  Pain in right hip  Other low back pain  Unsteadiness on feet  Other abnormalities of gait and mobility  Muscle weakness (generalized)  Other symptoms and signs involving the nervous system  Rationale for Evaluation and Treatment: Rehabilitation  ONSET DATE: one year  SUBJECTIVE:   SUBJECTIVE STATEMENT:  Patient reports she got a rollator and has been using it outdoors since Saturday; states she has been walking down her inclined driveway and "running up". Patient states she has been experiencing  with "side stepping" during freezing episodes and feels that it helps her get out of the freeze faster. Patient states her R hip is sore today.    PERTINENT HISTORY: Osteoporosis (cannot take meds), PD, R L 4/5 laminectomy, HTN, scoliosis, h/o falls PAIN:  Are you having pain? Yes: NPRS scale: 0 today up to 8/10 Pain location: R lumbar Pain description: stabbing spasm Aggravating factors: loading dishwasher, standing in bathroom Relieving factors: sitting  Are you having pain? Yes: NPRS scale: 0/10 Pain location: R hip near greater trochanter Pain description: soreness Aggravating factors: walking, stairs and sit to stand Relieving factors: Ibuprofen and lidocaine  patch  PRECAUTIONS: Fall and Other: OSTEOPOROSIS  RED FLAGS: None   WEIGHT BEARING RESTRICTIONS: No  FALLS:  Has patient fallen in last 6 months? 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  DIAGNOSTIC FINDINGS: none  PATIENT SURVEYS:  The Patient-Specific Functional Scale  Initial:  I am going to ask you to identify up to 3 important activities that you are unable to do or are having difficulty with as a result of this problem.  Today are there any activities that you are unable to do or having difficulty with because of this?  (Patient shown scale and patient rated each activity)  Follow up: When you first came in you had difficulty performing these activities.  Today do you still have difficulty?  Patient-Specific activity scoring scheme (Point to one number):  0 1 2 3 4 5 6 7 8 9  10 Unable                                                                                                           Able to perform To perform  activity at the same Activity         Level as before                                                                                                                       Injury or problem  Activity       Cooking/kitchen chores                                                                          Initial:      0                 follow up:  2.         Walking                                                                      Initial:         4              follow up:  3.         Sit to stand                                                                    Initial:        4               follow up:     COGNITION: Overall cognitive status: Within functional limits for tasks assessed     SENSATION: Not tested  MUSCLE LENGTH: B piriformis and HS tightness, R QL and lumbar  POSTURE: rounded shoulders, forward head, flexed trunk , and left thoracic R lumbar scoliosis (convexity)  PALPATION: Marked pain R QL, and R gluteals near greater trochanter  LUMBAR ROM:   Active  A/PROM  eval  Flexion WFL  Extension WFL  Right lateral flexion   Left lateral flexion   Right rotation NT  Left rotation NT   (Blank rows = not tested)   LOWER EXTREMITY ROM: WFL for tasks assessed  Active ROM Right eval Left eval  Hip flexion    Hip extension    Hip abduction    Hip adduction    Hip internal rotation 15* 20  Hip external rotation    Knee flexion    Knee extension    Ankle dorsiflexion    Ankle plantarflexion    Ankle inversion    Ankle eversion     (Blank rows = not tested) * cramping  LOWER EXTREMITY MMT: tested in sitting  MMT Right eval Left eval  Hip flexion 4 4  Hip extension    Hip abduction 4 4+  Hip adduction 5 5  Hip internal rotation    Hip  external rotation    Knee flexion    Knee extension 4+ 4+  Ankle dorsiflexion 4+ 4+  Ankle plantarflexion    Ankle inversion    Ankle eversion     (Blank rows = not tested)   FUNCTIONAL TESTS:  5 times sit to stand: 25.08 sec  04/20/24  13.35 sec GAIT: Distance walked: 20 Assistive device utilized: None Level of assistance: Complete Independence Comments: decreased heel strike, flexed knees and hips, short step length    [facility] Adult PT Treatment:                                                DATE: 05/03/2024 Therapeutic Exercise: Standing gastroc stretch x20" bilateral Counter: Heel raises + 4" ball b/w ankles x20 Resisted hip abd + YTB 2x10 Resisted hip ext + YTB x10 Hip ext on diagonal (hands braced on wall) x10 Scap squeezes --> bkwd shoulder circles  Seated thoracic ext stretch with deflated coregeous ball Neuromuscular re-ed: Airex: Head turns --> double vision with head turns to Rt Lateral weight shifting --> progressing to low marching Stepping on/off airex --> close SBA  Therapeutic Activity: Marching + same knee taps 2x15' Marching + opp knee taps 2x15' Side step squat + 1#dowel chest press 2x8 Alt walking fwd x4 steps + 2 wide steps x 80' Wall push-ups --> elbows down x10, elbows out x10 STS cradling 4#DB + marching in standing x8    [facility] Adult PT Treatment:                                                DATE: 04/22/2024 Therapeutic Exercise: Nustep L5 x 6 min Standing: Resisted hip abd, ext, marching, knee flexion  no wt today B  Toe taps on  8 inch steps x 10 ea B   Therapeutic Activity: Worked on floor to stand transfers from mat Kneeling position to 1/2 kneel with mat support 2x5 B Sit to stand 1x7 4# wt Seated OH press unilaterally 1# wt 2x10 Seated  chest press 1# wt 2x10 Wall push up (hands at chest height) x 8   [facility] Adult PT Treatment:                                                DATE: 04/15/2024 Therapeutic Exercise: Standing: Standing  lumbar extension at plinth x 10 Resisted R lateral step out + YTB crossed at ankles   Resisted hip abd (R) + YTB crossed at ankles - cues for full knee ext  x 10, then 1x10 no band  Standing quad sets/TKE  Therapeutic Activity:  Step up/down alternating feet: 4" --> 6" step leading with Rt leg Then S/L step up with contralateral HS curl x 10 B  4 inch Lateral step ups with B knee extension x 10 B 4 inch  Manual Therapy:   Trigger Point Dry Needling  Subsequent Treatment: Instructions provided previously at initial dry needling treatment.   Patient Verbal Consent Given: Yes Education Handout Provided: Previously Provided Muscles Treated: R gluteals Electrical Stimulation Performed: No Treatment Response/Outcome: Utilized skilled palpation to identify bony landmarks and trigger points.  Able to illicit twitch response and muscle elongation.  Soft tissue mobilization to R gluteals  following DN to further promote tissue elongation and decreased pain     PATIENT EDUCATION:  Education details: Updated HEP  Person educated: Patient Education method: Explanation, Demonstration, and Handouts Education comprehension: verbalized understanding and returned demonstration  HOME EXERCISE PROGRAM: Access Code: [email or web address] URL: [email or web address] Date: 04/22/2024 Prepared by: Concha Deed  Exercises - Standing Lumbar Extension with Counter  - 3 x daily - 7 x weekly - 1 sets - 10 reps - Supine Figure 4 Piriformis Stretch  - 1 x daily - 7 x weekly - 3 sets - 10 reps - Supine Lower Trunk Rotation  - 1 x daily - 7 x weekly - 3 sets - 10 reps - QL Stretch Supine  - 1 x daily - 7 x weekly - 3 sets - 10 reps - Seated Isometric Hip Abduction with Belt  - 1 x daily - 7 x weekly - 1-2 sets - 10 reps - 5 sec hold - Wall Push Up  - 1 x daily - 3 x weekly - 2 sets - 10 reps  ASSESSMENT:  CLINICAL IMPRESSION:  Dynamic balance activities incorporated to challenge reactive strategies and  multi-tasking. Head turns/nods discontinued with airex exercises due to reports of double vision with head turns to right. Occasional unsteadiness when stepping on/off airex due to low foot clearance. Upper body strengthening continued to progress strength with floor to standing transfers.  EVAL: Patient is a 83 y.o. female who was seen today for physical therapy evaluation and treatment for muscles spasms secondary to her scoliosis and for balance deficits secondary to PD. She also reports new onset of R hip pain 5 days ago. The back spasms and hip pain are limiting her ability to walk, perform sit to stand, and perform standing kitchen and bathroom chores. She denies any falls in the past 6 months, but reports that her balance is "not good". She uses a walker intermittently, mainly at the EOD and if she gets up during the night. She has deficits in flexibility and strength as well as posture and balance. She will benefit from skilled PT to address these deficits.  She experienced back spasm when attempting to do a supine hip stretch, so initial trial of DN performed on R QL with good twitch response. Patient reported decreased pain at end of session and was able to move without spasm.  OBJECTIVE IMPAIRMENTS: Abnormal gait, decreased activity tolerance, decreased balance, decreased ROM, decreased strength, increased muscle spasms, impaired flexibility, postural dysfunction, and pain.   ACTIVITY LIMITATIONS: bending, sitting, standing, squatting, sleeping, stairs, transfers, bed mobility, bathing, dressing, hygiene/grooming, and locomotion level  PARTICIPATION LIMITATIONS: meal prep, cleaning, laundry, shopping, and community activity  PERSONAL FACTORS: Age, Fitness, Time since onset of injury/illness/exacerbation, and 3+ comorbidities: OP, PD, scoliosis, HTN are also affecting patient's functional outcome.   REHAB POTENTIAL: Good  CLINICAL DECISION MAKING: Unstable/unpredictable  EVALUATION  COMPLEXITY: High  GOALS: Goals reviewed with patient? Yes  SHORT TERM GOALS: Target date: 04/28/24 Decreased back spasms by >= 50% to improve QOL Baseline: Goal status: IN PROGRESS 25% 5/6  2.  Decreased hip pain by 50% with steps, transfers and walking. Baseline:  Goal status: INITIAL  3.  Balance assessment completed at second visit and goals updated. Baseline:  Goal status: MET   LONG TERM GOALS: Target date: 05/26/24  Patient able to perform ADLS without back spasms. Baseline:  Goal status: INITIAL  2.  Pt able to walk and climb stairs without R hip pain. Baseline:  Goal status: INITIAL  3.  Improved 5XSTS by 5 seconds demonstrating improved strength and decreasing risk for falls. Baseline: 25.08 seconds. Goal status:MET 04/20/24 13.35 sec  4.  Improved BERG score by 8 points showing functional improvement Baseline: 43/56 Goal status: INITIAL  5.  Improve PSFS by 2-3 points  Baseline: see objective Goal status: INITIAL     PLAN:  PT FREQUENCY: 2x/week  PT DURATION: 8 weeks  PLANNED INTERVENTIONS: 97164- PT Re-evaluation, 97110-Therapeutic exercises, 97530- Therapeutic activity, 97112- Neuromuscular re-education, 97535- Self Care, 16109- Manual therapy, [phone]- Gait training, [phone]- Electrical stimulation (unattended), [phone]- Ionotophoresis 4mg /ml Dexamethasone , Patient/Family education, Balance training, Stair training, Taping, Dry Needling, Joint mobilization, Cryotherapy, and Moist heat  PLAN FOR NEXT SESSION: Continue to Work on floor to stand transfers, UE strength to assist with this. Work on balance. Progress hip stretching, glute med strengthening, LE strength, gait and balance. PRECAUTION: OSTEOPOROSIS.    Sims Duck, PTA 05/03/2024, 2:02 PM

## 2024-05-05 ENCOUNTER — Ambulatory Visit

## 2024-05-05 DIAGNOSIS — R252 Cramp and spasm: Secondary | ICD-10-CM

## 2024-05-05 DIAGNOSIS — M25551 Pain in right hip: Secondary | ICD-10-CM

## 2024-05-05 DIAGNOSIS — R2681 Unsteadiness on feet: Secondary | ICD-10-CM

## 2024-05-05 DIAGNOSIS — M5459 Other low back pain: Secondary | ICD-10-CM

## 2024-05-05 NOTE — Therapy (Signed)
 OUTPATIENT PHYSICAL THERAPY LUMBAR AND LE TREATMENT   Patient Name: Chelsey Estes MRN: [id number] DOB:1941/08/05, 83 y.o., female Today's Date: 05/05/2024  END OF SESSION:  PT End of Session - 05/05/24 1106     Visit Number 9    Date for PT Re-Evaluation 05/26/24    Authorization Type MCR    Progress Note Due on Visit 10    PT Start Time 1105    PT Stop Time 1143    PT Time Calculation (min) 38 min    Activity Tolerance Patient tolerated treatment well    Behavior During Therapy WFL for tasks assessed/performed            Past Medical History:  Diagnosis Date   Anxiety    Arthritis    Depression    Glaucoma    History of kidney stones    7 lithrotripsy   Hyperlipidemia    Hypertension    Parkinson disease (HCC)    Past Surgical History:  Procedure Laterality Date   ABDOMINAL HYSTERECTOMY     ANTERIOR (CYSTOCELE) AND POSTERIOR REPAIR (RECTOCELE) WITH XENFORM GRAFT AND SACROSPINOUS FIXATION     CATARACT EXTRACTION, BILATERAL     INGUINAL HERNIA REPAIR Left    kidney stones     LUMBAR LAMINECTOMY/DECOMPRESSION MICRODISCECTOMY Right 10/12/2021   Procedure: Right Lumbar four-five Laminectomy for facet/synovial cyst;  Surgeon: Isadora Mar, MD;  Location: [facility] OR;  Service: Neurosurgery;  Laterality: Right;   Patient Active Problem List   Diagnosis Date Noted   Dysuria 02/25/2024   Leukocytes in urine 02/25/2024   Pruritus 10/17/2023   Heart palpitations 07/14/2023   History of hypokalemia 07/14/2023   Vitamin D  deficiency 12/02/2022   Kidney stone 11/22/2022   Tinnitus of left ear 07/03/2022   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 07/03/2022   Decreased GFR 04/17/2022   Medication management 04/16/2022   Synovial cyst of lumbar facet joint 09/19/2021   DDD (degenerative disc disease), lumbar 04/11/2021   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 03/09/2021   Palpitations 09/05/2020   SOB (shortness of  breath) 09/05/2020   Chest pain 09/05/2020   Mixed hyperlipidemia 06/06/2020   Primary insomnia 06/06/2020   Primary parkinsonism (HCC) 10/26/2019   Osteoporosis 09/15/2019   Blister of great toe of left foot 08/19/2019   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   Moderate episode of recurrent major depressive disorder (HCC) 02/09/2019   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 02/09/2019   Shuffling gait 02/09/2019   Balance problems 02/09/2019   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   Orthostatic hypotension 02/01/2019   Dizziness 02/01/2019   Primary osteoarthritis of right foot 09/25/2018   Snoring 09/25/2018   Night terrors, adult 09/25/2018   Multiple falls 09/25/2018   Metatarsalgia of left foot 08/20/2018   Chronic foot pain, right 08/20/2018   Left hip pain 04/10/2017   History of shingles 04/08/2017   Adenomatous polyp 04/08/2017   Pure hypercholesterolemia 04/08/2017   History of kidney stones 04/08/2017   Essential hypertension 04/08/2017   Depression, recurrent (HCC) 04/08/2017   Anxiety 04/08/2017   Greater trochanteric bursitis of left hip 04/08/2017    PCP: Araceli Knight, PA-C   REFERRING PROVIDER: Shirline Dover, DO   REFERRING DIAG:  G20.A1 (ICD-10-CM) - Parkinson's disease without dyskinesia or fluctuating manifestations (HCC)  M41.9 (ICD-10-CM) - Scoliosis, unspecified scoliosis type, unspecified spinal region    THERAPY DIAG:  Cramp and spasm  Pain in right hip  Other low back pain  Unsteadiness on feet  Rationale for Evaluation and Treatment: Rehabilitation  ONSET DATE: one year  SUBJECTIVE:   SUBJECTIVE STATEMENT:  Patient reports her hip is feeling better, states she continues to have discomfort when navigating stairs.   PERTINENT HISTORY: Osteoporosis (cannot take meds), PD, R L 4/5 laminectomy, HTN, scoliosis, h/o falls PAIN:  Are you having pain? Yes: NPRS scale: 0 today up to 8/10 Pain location: R lumbar Pain description:  stabbing spasm Aggravating factors: loading dishwasher, standing in bathroom Relieving factors: sitting  Are you having pain? Yes: NPRS scale: 0/10 Pain location: R hip near greater trochanter Pain description: soreness Aggravating factors: walking, stairs and sit to stand Relieving factors: Ibuprofen and lidocaine  patch  PRECAUTIONS: Fall and Other: OSTEOPOROSIS  RED FLAGS: None   WEIGHT BEARING RESTRICTIONS: No  FALLS:  Has patient fallen in last 6 months? 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  DIAGNOSTIC FINDINGS: none  PATIENT SURVEYS:  The Patient-Specific Functional Scale  Initial:  I am going to ask you to identify up to 3 important activities that you are unable to do or are having difficulty with as a result of this problem.  Today are there any activities that you are unable to do or having difficulty with because of this?  (Patient shown scale and patient rated each activity)  Follow up: When you first came in you had difficulty performing these activities.  Today do you still have difficulty?  Patient-Specific activity scoring scheme (Point to one number):  0 1 2 3 4 5 6 7 8 9  10 Unable                                                                                                          Able to perform To perform                                                                                                    activity at the same Activity         Level as before  Injury or problem  Activity       Cooking/kitchen chores                                                                          Initial:      0                 follow up:  2.         Walking                                                                      Initial:         4              follow up:  3.         Sit to stand                                                                    Initial:        4               follow up:     COGNITION: Overall cognitive status: Within functional limits for tasks assessed     SENSATION: Not tested  MUSCLE LENGTH: B piriformis and HS tightness, R QL and lumbar  POSTURE: rounded shoulders, forward head, flexed trunk , and left thoracic R lumbar scoliosis (convexity)  PALPATION: Marked pain R QL, and R gluteals near greater trochanter  LUMBAR ROM:   Active  A/PROM  eval  Flexion WFL  Extension WFL  Right lateral flexion   Left lateral flexion   Right rotation NT  Left rotation NT   (Blank rows = not tested)   LOWER EXTREMITY ROM: WFL for tasks assessed  Active ROM Right eval Left eval  Hip flexion    Hip extension    Hip abduction    Hip adduction    Hip internal rotation 15* 20  Hip external rotation    Knee flexion    Knee extension    Ankle dorsiflexion    Ankle plantarflexion    Ankle inversion    Ankle eversion     (Blank rows = not tested) * cramping  LOWER EXTREMITY MMT: tested in sitting  MMT Right eval Left eval  Hip flexion 4 4  Hip extension    Hip abduction 4 4+  Hip adduction 5 5  Hip internal rotation    Hip external rotation    Knee flexion    Knee extension 4+ 4+  Ankle dorsiflexion 4+ 4+  Ankle plantarflexion    Ankle inversion    Ankle eversion     (Blank rows = not tested)   FUNCTIONAL TESTS:  5 times sit  to stand: 25.08 sec  04/20/24  13.35 sec GAIT: Distance walked: 20 Assistive device utilized: None Level of assistance: Complete  Independence Comments: decreased heel strike, flexed knees and hips, short step length   [facility] Adult PT Treatment:                                                DATE: 05/05/2024 Therapeutic Exercise: Counter: Gastroc stretch Toe raises Heel raises + 4" ball b/w ankles LTR x 1 min Figure 4 LTR --> L direction only SKTC Core marching Bridges --> discontinued d/t R back cramping Seated green PB rollout stretch --> front & L diagonal Therapeutic Activity: Kneeling on airex, UE on table --> kneeling to standing transfers --> progressed to 1 hand on table, 1 hand braced on thigh Split stance squats 6" step --> step up/down x 1 min    [facility] Adult PT Treatment:                                                DATE: 05/03/2024 Therapeutic Exercise: Standing gastroc stretch x20" bilateral Counter: Heel raises + 4" ball b/w ankles x20 Resisted hip abd + YTB 2x10 Resisted hip ext + YTB x10 Hip ext on diagonal (hands braced on wall) x10 Scap squeezes --> bkwd shoulder circles  Seated thoracic ext stretch with deflated coregeous ball Neuromuscular re-ed: Airex: Head turns --> double vision with head turns to Rt Lateral weight shifting --> progressing to low marching Stepping on/off airex --> close SBA  Therapeutic Activity: Marching + same knee taps 2x15' Marching + opp knee taps 2x15' Side step squat + 1#dowel chest press 2x8 Alt walking fwd x4 steps + 2 wide steps x 80' Wall push-ups --> elbows down x10, elbows out x10 STS cradling 4#DB + marching in standing x8    [facility] Adult PT Treatment:                                                DATE: 04/22/2024 Therapeutic Exercise: Nustep L5 x 6 min Standing: Resisted hip abd, ext, marching, knee flexion  no wt today B  Toe taps on  8 inch steps x 10 ea B   Therapeutic Activity: Worked on floor to stand transfers from mat Kneeling position to 1/2 kneel with mat support 2x5 B Sit to stand 1x7 4# wt Seated OH press unilaterally 1# wt  2x10 Seated  chest press 1# wt 2x10 Wall push up (hands at chest height) x 8   [facility] Adult PT Treatment:                                                DATE: 04/15/2024 Therapeutic Exercise: Standing: Standing lumbar extension at plinth x 10 Resisted R lateral step out + YTB crossed at ankles   Resisted hip abd (R) + YTB crossed at ankles - cues for full knee ext  x 10, then 1x10 no band  Standing quad sets/TKE  Therapeutic Activity: Step up/down alternating feet: 4" --> 6" step leading with Rt leg Then S/L step up with contralateral HS curl x 10 B  4 inch Lateral step ups with B knee extension x 10 B 4 inch  Manual Therapy:   Trigger Point Dry Needling  Subsequent Treatment: Instructions provided previously at initial dry needling treatment.   Patient Verbal Consent Given: Yes Education Handout Provided: Previously Provided Muscles Treated: R gluteals Electrical Stimulation Performed: No Treatment Response/Outcome: Utilized skilled palpation to identify bony landmarks and trigger points.  Able to illicit twitch response and muscle elongation.  Soft tissue mobilization to R gluteals  following DN to further promote tissue elongation and decreased pain     PATIENT EDUCATION:  Education details: Updated HEP  Person educated: Patient Education method: Explanation, Demonstration, and Handouts Education comprehension: verbalized understanding and returned demonstration  HOME EXERCISE PROGRAM: Access Code: [email or web address] URL: [email or web address] Date: 04/22/2024 Prepared by: Concha Deed  Exercises - Standing Lumbar Extension with Counter  - 3 x daily - 7 x weekly - 1 sets - 10 reps - Supine Figure 4 Piriformis Stretch  - 1 x daily - 7 x weekly - 3 sets - 10 reps - Supine Lower Trunk Rotation  - 1 x daily - 7 x weekly - 3 sets - 10 reps - QL Stretch Supine  - 1 x daily - 7 x weekly - 3 sets - 10 reps - Seated Isometric Hip Abduction with Belt  - 1 x daily - 7 x weekly - 1-2  sets - 10 reps - 5 sec hold - Wall Push Up  - 1 x daily - 3 x weekly - 2 sets - 10 reps  ASSESSMENT:  CLINICAL IMPRESSION:  Floor transfers progressed with focus on standing from kneeling position with UE support on low table. Activity progressed to one hand braced on thigh with transfer to standing; patient fatigued quickly with transfers. Increased R-sided low back muscular cramping with bridges and lateral trunk rotation; patient may benefit from dry needling at next session of cramping continues.   EVAL: Patient is a 83 y.o. female who was seen today for physical therapy evaluation and treatment for muscles spasms secondary to her scoliosis and for balance deficits secondary to PD. She also reports new onset of R hip pain 5 days ago. The back spasms and hip pain are limiting her ability to walk, perform sit to stand, and perform standing kitchen and bathroom chores. She denies any falls in the past 6 months, but reports that her balance is "not good". She uses a walker intermittently, mainly at the EOD and if she gets up during the night. She has deficits in flexibility and strength as well as posture and balance. She will benefit from skilled PT to address these deficits.  She experienced back spasm when attempting to do a supine hip stretch, so initial trial of DN performed on R QL with good twitch response. Patient reported decreased pain at end of session and was able to move without spasm.  OBJECTIVE IMPAIRMENTS: Abnormal gait, decreased activity tolerance, decreased balance, decreased ROM, decreased strength, increased muscle spasms, impaired flexibility, postural dysfunction, and pain.   ACTIVITY LIMITATIONS: bending, sitting, standing, squatting, sleeping, stairs, transfers, bed mobility, bathing, dressing, hygiene/grooming, and locomotion level  PARTICIPATION LIMITATIONS: meal prep, cleaning, laundry, shopping, and community activity  PERSONAL FACTORS: Age, Fitness, Time since onset of  injury/illness/exacerbation, and 3+ comorbidities: OP, PD, scoliosis, HTN are also affecting patient's functional outcome.   REHAB POTENTIAL:  Good  CLINICAL DECISION MAKING: Unstable/unpredictable  EVALUATION COMPLEXITY: High   GOALS: Goals reviewed with patient? Yes  SHORT TERM GOALS: Target date: 04/28/24 Decreased back spasms by >= 50% to improve QOL Baseline: Goal status: IN PROGRESS 25% 5/6  2.  Decreased hip pain by 50% with steps, transfers and walking. Baseline:  Goal status: INITIAL  3.  Balance assessment completed at second visit and goals updated. Baseline:  Goal status: MET   LONG TERM GOALS: Target date: 05/26/24  Patient able to perform ADLS without back spasms. Baseline:  Goal status: INITIAL  2.  Pt able to walk and climb stairs without R hip pain. Baseline:  Goal status: INITIAL  3.  Improved 5XSTS by 5 seconds demonstrating improved strength and decreasing risk for falls. Baseline: 25.08 seconds. Goal status: MET 04/20/24 13.35 sec  4.  Improved BERG score by 8 points showing functional improvement Baseline: 43/56 Goal status: INITIAL  5.  Improve PSFS by 2-3 points  Baseline: see objective Goal status: INITIAL     PLAN:  PT FREQUENCY: 2x/week  PT DURATION: 8 weeks  PLANNED INTERVENTIONS: 97164- PT Re-evaluation, 97110-Therapeutic exercises, 97530- Therapeutic activity, 97112- Neuromuscular re-education, 97535- Self Care, 16109- Manual therapy, [phone]- Gait training, [phone]- Electrical stimulation (unattended), [phone]- Ionotophoresis 4mg /ml Dexamethasone , Patient/Family education, Balance training, Stair training, Taping, Dry Needling, Joint mobilization, Cryotherapy, and Moist heat  PLAN FOR NEXT SESSION: Continue to Work on floor to stand transfers, UE strength to assist with this. Work on balance. Progress hip stretching, glute med strengthening, LE strength, gait and balance. PRECAUTION: OSTEOPOROSIS.    Sims Duck, PTA 05/05/2024,  11:44 AM

## 2024-05-10 NOTE — Therapy (Addendum)
 + OUTPATIENT PHYSICAL THERAPY LUMBAR AND LE TREATMENT AND PROGRESS NOTE  Reporting Period 03/31/24 to 05/11/24   See note below for Objective Data and Assessment of Progress/Goals.    Patient Name: Chelsey Estes MRN: [id number] DOB:04/22/1941, 83 y.o., female Today's Date: 05/11/2024  END OF SESSION:  PT End of Session - 05/11/24 1319     Visit Number 10    Date for PT Re-Evaluation 05/26/24    Authorization Type MCR    Progress Note Due on Visit 10    PT Start Time 1316    PT Stop Time 1400    PT Time Calculation (min) 44 min    Activity Tolerance Patient tolerated treatment well    Behavior During Therapy WFL for tasks assessed/performed             Past Medical History:  Diagnosis Date   Anxiety    Arthritis    Depression    Glaucoma    History of kidney stones    7 lithrotripsy   Hyperlipidemia    Hypertension    Parkinson disease (HCC)    Past Surgical History:  Procedure Laterality Date   ABDOMINAL HYSTERECTOMY     ANTERIOR (CYSTOCELE) AND POSTERIOR REPAIR (RECTOCELE) WITH XENFORM GRAFT AND SACROSPINOUS FIXATION     CATARACT EXTRACTION, BILATERAL     INGUINAL HERNIA REPAIR Left    kidney stones     LUMBAR LAMINECTOMY/DECOMPRESSION MICRODISCECTOMY Right 10/12/2021   Procedure: Right Lumbar four-five Laminectomy for facet/synovial cyst;  Surgeon: Isadora Mar, MD;  Location: [facility] OR;  Service: Neurosurgery;  Laterality: Right;   Patient Active Problem List   Diagnosis Date Noted   Dysuria 02/25/2024   Leukocytes in urine 02/25/2024   Pruritus 10/17/2023   Heart palpitations 07/14/2023   History of hypokalemia 07/14/2023   Vitamin D  deficiency 12/02/2022   Kidney stone 11/22/2022   Tinnitus of left ear 07/03/2022   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 07/03/2022   Decreased GFR 04/17/2022   Medication management 04/16/2022   Synovial cyst of lumbar facet joint 09/19/2021   DDD (degenerative disc disease),  lumbar 04/11/2021   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 03/09/2021   Palpitations 09/05/2020   SOB (shortness of breath) 09/05/2020   Chest pain 09/05/2020   Mixed hyperlipidemia 06/06/2020   Primary insomnia 06/06/2020   Primary parkinsonism (HCC) 10/26/2019   Osteoporosis 09/15/2019   Blister of great toe of left foot 08/19/2019   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   Moderate episode of recurrent major depressive disorder (HCC) 02/09/2019   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 02/09/2019   Shuffling gait 02/09/2019   Balance problems 02/09/2019   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   Orthostatic hypotension 02/01/2019   Dizziness 02/01/2019   Primary osteoarthritis of right foot 09/25/2018   Snoring 09/25/2018   Night terrors, adult 09/25/2018   Multiple falls 09/25/2018   Metatarsalgia of left foot 08/20/2018   Chronic foot pain, right 08/20/2018   Left hip pain 04/10/2017   History of shingles 04/08/2017   Adenomatous polyp 04/08/2017   Pure hypercholesterolemia 04/08/2017   History of kidney stones 04/08/2017   Essential hypertension 04/08/2017   Depression, recurrent (HCC) 04/08/2017   Anxiety 04/08/2017   Greater trochanteric bursitis of left hip 04/08/2017    PCP: Araceli Knight, PA-C   REFERRING PROVIDER: Shirline Dover, DO   REFERRING DIAG:  G20.A1 (ICD-10-CM) - Parkinson's disease without dyskinesia or fluctuating manifestations (HCC)  M41.9 (  ICD-10-CM) - Scoliosis, unspecified scoliosis type, unspecified spinal region    THERAPY DIAG:  Cramp and spasm  Pain in right hip  Other low back pain  Unsteadiness on feet  Other abnormalities of gait and mobility  Muscle weakness (generalized)  Other symptoms and signs involving the nervous system  Rationale for Evaluation and Treatment: Rehabilitation  ONSET DATE: one year  SUBJECTIVE:   SUBJECTIVE STATEMENT:  I think I'm doing better. I have good days and bad days.  Yesterday I had R thigh pain, but today I'm better. The hip is better. Sometimes I have pain in the morning when I get up or if I've been sitting in my recliner. Having trouble with catching my toes  in the house. I got my rollator.    PERTINENT HISTORY: Osteoporosis (cannot take meds), PD, R L 4/5 laminectomy, HTN, scoliosis, h/o falls PAIN:  Are you having pain? Yes: NPRS scale: 0 today up to 8/10 Pain location: R lumbar Pain description: stabbing spasm Aggravating factors: loading dishwasher, standing in bathroom Relieving factors: sitting  Are you having pain? Yes: NPRS scale: 0/10 Pain location: R hip near greater trochanter Pain description: soreness Aggravating factors: walking, stairs and sit to stand Relieving factors: Ibuprofen and lidocaine  patch  PRECAUTIONS: Fall and Other: OSTEOPOROSIS  RED FLAGS: None   WEIGHT BEARING RESTRICTIONS: No  FALLS:  Has patient fallen in last 6 months? 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  DIAGNOSTIC FINDINGS: none  PATIENT SURVEYS:  The Patient-Specific Functional Scale  Initial:  I am going to ask you to identify up to 3 important activities that you are unable to do or are having difficulty with as a result of this problem.  Today are there any activities that you are unable to do or having difficulty with because of this?  (Patient shown scale and patient rated each activity)  Follow up: When you first came in you had difficulty performing these activities.  Today do you still have difficulty?  Patient-Specific  activity scoring scheme (Point to one number):  0 1 2 3 4 5 6 7 8 9  10 Unable                                                                                                          Able to perform To perform  activity at the same Activity         Level as before                                                                                                                       Injury or problem  Activity       Cooking/kitchen chores                                                                          Initial:      0                 follow up: 8 on 5/27  2.         Walking                                                                      Initial:         4              follow up: 7 on 5/27  3.         Sit to stand                                                                    Initial:        4               follow up: 7 on 5/27     COGNITION: Overall cognitive status: Within functional limits for tasks assessed     SENSATION: Not tested  MUSCLE LENGTH: B piriformis and HS tightness, R QL and lumbar  POSTURE: rounded shoulders, forward head, flexed trunk , and left thoracic R lumbar scoliosis (convexity)  PALPATION: Marked pain R QL, and R gluteals near greater trochanter  LUMBAR ROM:   Active  A/PROM  eval  Flexion WFL  Extension WFL  Right lateral flexion   Left lateral flexion   Right rotation NT  Left rotation NT   (Blank rows = not tested)   LOWER EXTREMITY ROM: WFL for tasks assessed  Active ROM Right eval Left eval  Hip flexion    Hip extension    Hip abduction    Hip adduction  Hip internal rotation 15* 20  Hip external rotation    Knee flexion    Knee extension    Ankle dorsiflexion    Ankle plantarflexion    Ankle inversion    Ankle eversion     (Blank rows = not tested) * cramping  LOWER EXTREMITY MMT: tested in sitting  MMT  Right eval Left eval  Hip flexion 4 4  Hip extension    Hip abduction 4 4+  Hip adduction 5 5  Hip internal rotation    Hip external rotation    Knee flexion    Knee extension 4+ 4+  Ankle dorsiflexion 4+ 4+  Ankle plantarflexion    Ankle inversion    Ankle eversion     (Blank rows = not tested)   FUNCTIONAL TESTS:  5 times sit to stand: 25.08 sec  04/20/24  13.35 sec  GAIT: Distance walked: 20 Assistive device utilized: None Level of assistance: Complete Independence Comments: decreased heel strike, flexed knees and hips, short step length   [facility] Adult PT Treatment:                                                DATE: 05/11/2024 Self Care: Discussed methods to help with gait freezing; use of rollator and use of brakes on declines MFR with firm ball to R lumbar Therapeutic Exercise: Counter: Gastroc stretch Toe raises Heel raises + 4" ball b/w ankles Heel walking and toe walking LTR causes spasm today so held Figure 4 LTR --> L direction only SKTC Core marching - back feels like it's going to spasm R S/L clam --> discontinued d/t R back cramping Seated green PB rollout stretch --> front & L diagonal Therapeutic Activity: Checked goals PSFS Split stance squats using 2 (3 min egg foams) as a target for knee 6" step --> step up/down x 5 B  [facility] Adult PT Treatment:                                                DATE: 05/05/2024 Therapeutic Exercise: Counter: Gastroc stretch Toe raises Heel raises + 4" ball b/w ankles LTR x 1 min Figure 4 LTR --> L direction only SKTC Core marching Bridges --> discontinued d/t R back cramping Seated green PB rollout stretch --> front & L diagonal Therapeutic Activity: Kneeling on airex, UE on table --> kneeling to standing transfers --> progressed to 1 hand on table, 1 hand braced on thigh Split stance squats 6" step --> step up/down x 1 min    [facility] Adult PT Treatment:                                                DATE:  05/03/2024 Therapeutic Exercise: Standing gastroc stretch x20" bilateral Counter: Heel raises + 4" ball b/w ankles x20 Resisted hip abd + YTB 2x10 Resisted hip ext + YTB x10 Hip ext on diagonal (hands braced on wall) x10 Scap squeezes --> bkwd shoulder circles  Seated thoracic ext stretch with deflated coregeous ball Neuromuscular re-ed: Airex: Head turns --> double vision with head turns to  Rt Lateral weight shifting --> progressing to low marching Stepping on/off airex --> close SBA  Therapeutic Activity: Marching + same knee taps 2x15' Marching + opp knee taps 2x15' Side step squat + 1#dowel chest press 2x8 Alt walking fwd x4 steps + 2 wide steps x 80' Wall push-ups --> elbows down x10, elbows out x10 STS cradling 4#DB + marching in standing x8    [facility] Adult PT Treatment:                                                DATE: 04/22/2024 Therapeutic Exercise: Nustep L5 x 6 min Standing: Resisted hip abd, ext, marching, knee flexion  no wt today B  Toe taps on  8 inch steps x 10 ea B   Therapeutic Activity: Worked on floor to stand transfers from mat Kneeling position to 1/2 kneel with mat support 2x5 B Sit to stand 1x7 4# wt Seated OH press unilaterally 1# wt 2x10 Seated  chest press 1# wt 2x10 Wall push up (hands at chest height) x 8    PATIENT EDUCATION:  Education details: Updated HEP  Person educated: Patient Education method: Explanation, Demonstration, and Handouts Education comprehension: verbalized understanding and returned demonstration  HOME EXERCISE PROGRAM: Access Code: [email or web address] URL: [email or web address] Date: 04/22/2024 Prepared by: Concha Deed  Exercises - Standing Lumbar Extension with Counter  - 3 x daily - 7 x weekly - 1 sets - 10 reps - Supine Figure 4 Piriformis Stretch  - 1 x daily - 7 x weekly - 3 sets - 10 reps - Supine Lower Trunk Rotation  - 1 x daily - 7 x weekly - 3 sets - 10 reps - QL Stretch Supine  - 1 x daily - 7 x  weekly - 3 sets - 10 reps - Seated Isometric Hip Abduction with Belt  - 1 x daily - 7 x weekly - 1-2 sets - 10 reps - 5 sec hold - Wall Push Up  - 1 x daily - 3 x weekly - 2 sets - 10 reps  ASSESSMENT:  CLINICAL IMPRESSION:  Patient is progressing with goals meeting all three of her patient specific goals. She continues to experience intermittent spasming in the R lumbar which tends to occur with rotational activities. She responded fairly well to [facility] with firm ball today. Overall she reports 75% improvement in pain with stairs and walking.  She reports she is having trouble with gait freezing and festination and we worked on techniques to address this. She got a new rollator walker and is using it to walk in her driveway now. Patient continues to demonstrate potential for improvement and would benefit from continued skilled therapy to address impairments.    EVAL: Patient is a 83 y.o. female who was seen today for physical therapy evaluation and treatment for muscles spasms secondary to her scoliosis and for balance deficits secondary to PD. She also reports new onset of R hip pain 5 days ago. The back spasms and hip pain are limiting her ability to walk, perform sit to stand, and perform standing kitchen and bathroom chores. She denies any falls in the past 6 months, but reports that her balance is "not good". She uses a walker intermittently, mainly at the EOD and if she gets up during the night. She has deficits in flexibility and strength as well as posture and  balance. She will benefit from skilled PT to address these deficits.  She experienced back spasm when attempting to do a supine hip stretch, so initial trial of DN performed on R QL with good twitch response. Patient reported decreased pain at end of session and was able to move without spasm.  OBJECTIVE IMPAIRMENTS: Abnormal gait, decreased activity tolerance, decreased balance, decreased ROM, decreased strength, increased muscle spasms,  impaired flexibility, postural dysfunction, and pain.   ACTIVITY LIMITATIONS: bending, sitting, standing, squatting, sleeping, stairs, transfers, bed mobility, bathing, dressing, hygiene/grooming, and locomotion level  PARTICIPATION LIMITATIONS: meal prep, cleaning, laundry, shopping, and community activity  PERSONAL FACTORS: Age, Fitness, Time since onset of injury/illness/exacerbation, and 3+ comorbidities: OP, PD, scoliosis, HTN are also affecting patient's functional outcome.   REHAB POTENTIAL: Good  CLINICAL DECISION MAKING: Unstable/unpredictable  EVALUATION COMPLEXITY: High   GOALS: Goals reviewed with patient? Yes  SHORT TERM GOALS: Target date: 04/28/24 Decreased back spasms by >= 50% to improve QOL Baseline: Goal status: IN PROGRESS 25% 5/6  2.  Decreased hip pain by 50% with steps, transfers and walking. Baseline:  Goal status: MET  3.  Balance assessment completed at second visit and goals updated. Baseline:  Goal status: MET   LONG TERM GOALS: Target date: 05/26/24  Patient able to perform ADLS without back spasms. Baseline:  Goal status: IN PROGRESS 05/11/24  2.  Pt able to walk and climb stairs without R hip pain. Baseline:  Goal status: IN PROGRESS 75%  better 5/27  3.  Improved 5XSTS by 5 seconds demonstrating improved strength and decreasing risk for falls. Baseline: 25.08 seconds. Goal status: MET 04/20/24 13.35 sec  4.  Improved BERG score by 8 points showing functional improvement Baseline: 43/56 Goal status: IN PROGRESS  5.  Improve PSFS by 2-3 points  Baseline: see objective Goal status: MET 05/11/24     PLAN:  PT FREQUENCY: 2x/week  PT DURATION: 8 weeks  PLANNED INTERVENTIONS: [id number]- PT Re-evaluation, 97110-Therapeutic exercises, 97530- Therapeutic activity, 97112- Neuromuscular re-education, 97535- Self Care, [id number]- Manual therapy, [phone]- Gait training, [phone]- Electrical stimulation (unattended), [phone]- Ionotophoresis 4mg /ml  Dexamethasone , Patient/Family education, Balance training, Stair training, Taping, Dry Needling, Joint mobilization, Cryotherapy, and Moist heat  PLAN FOR NEXT SESSION: CHECK BERG Continue to Work on floor to stand transfers, UE strength to assist with this. Work on balance. Progress hip stretching, glute med strengthening, LE strength, gait and balance. PRECAUTION: OSTEOPOROSIS.    Jinx Mourning, PT  05/11/2024, 4:42 PM

## 2024-05-11 ENCOUNTER — Ambulatory Visit: Admitting: Physical Therapy

## 2024-05-11 ENCOUNTER — Encounter: Payer: Self-pay | Admitting: Physical Therapy

## 2024-05-11 DIAGNOSIS — M25551 Pain in right hip: Secondary | ICD-10-CM

## 2024-05-11 DIAGNOSIS — R29818 Other symptoms and signs involving the nervous system: Secondary | ICD-10-CM

## 2024-05-11 DIAGNOSIS — M6281 Muscle weakness (generalized): Secondary | ICD-10-CM

## 2024-05-11 DIAGNOSIS — R2681 Unsteadiness on feet: Secondary | ICD-10-CM

## 2024-05-11 DIAGNOSIS — R2689 Other abnormalities of gait and mobility: Secondary | ICD-10-CM

## 2024-05-11 DIAGNOSIS — R252 Cramp and spasm: Secondary | ICD-10-CM | POA: Diagnosis not present

## 2024-05-11 DIAGNOSIS — M5459 Other low back pain: Secondary | ICD-10-CM

## 2024-05-14 ENCOUNTER — Ambulatory Visit

## 2024-05-14 DIAGNOSIS — M25551 Pain in right hip: Secondary | ICD-10-CM

## 2024-05-14 DIAGNOSIS — R252 Cramp and spasm: Secondary | ICD-10-CM

## 2024-05-14 DIAGNOSIS — R2681 Unsteadiness on feet: Secondary | ICD-10-CM

## 2024-05-14 DIAGNOSIS — M6281 Muscle weakness (generalized): Secondary | ICD-10-CM

## 2024-05-14 DIAGNOSIS — R2689 Other abnormalities of gait and mobility: Secondary | ICD-10-CM

## 2024-05-14 DIAGNOSIS — R29818 Other symptoms and signs involving the nervous system: Secondary | ICD-10-CM

## 2024-05-14 DIAGNOSIS — M5459 Other low back pain: Secondary | ICD-10-CM

## 2024-05-14 NOTE — Therapy (Signed)
 OUTPATIENT PHYSICAL THERAPY LUMBAR AND LE TREATMENT   Patient Name: Chelsey Estes MRN: [id number] DOB:Jun 11, 1941, 83 y.o., female Today's Date: 05/14/2024  END OF SESSION:  PT End of Session - 05/14/24 0930     Visit Number 11    Date for PT Re-Evaluation 05/26/24    Authorization Type MCR    PT Start Time 0930    PT Stop Time 1015    PT Time Calculation (min) 45 min    Activity Tolerance Patient tolerated treatment well    Behavior During Therapy WFL for tasks assessed/performed            Past Medical History:  Diagnosis Date   Anxiety    Arthritis    Depression    Glaucoma    History of kidney stones    7 lithrotripsy   Hyperlipidemia    Hypertension    Parkinson disease (HCC)    Past Surgical History:  Procedure Laterality Date   ABDOMINAL HYSTERECTOMY     ANTERIOR (CYSTOCELE) AND POSTERIOR REPAIR (RECTOCELE) WITH XENFORM GRAFT AND SACROSPINOUS FIXATION     CATARACT EXTRACTION, BILATERAL     INGUINAL HERNIA REPAIR Left    kidney stones     LUMBAR LAMINECTOMY/DECOMPRESSION MICRODISCECTOMY Right 10/12/2021   Procedure: Right Lumbar four-five Laminectomy for facet/synovial cyst;  Surgeon: Isadora Mar, MD;  Location: [facility] OR;  Service: Neurosurgery;  Laterality: Right;   Patient Active Problem List   Diagnosis Date Noted   Dysuria 02/25/2024   Leukocytes in urine 02/25/2024   Pruritus 10/17/2023   Heart palpitations 07/14/2023   History of hypokalemia 07/14/2023   Vitamin D  deficiency 12/02/2022   Kidney stone 11/22/2022   Tinnitus of left ear 07/03/2022   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 07/03/2022   Decreased GFR 04/17/2022   Medication management 04/16/2022   Synovial cyst of lumbar facet joint 09/19/2021   DDD (degenerative disc disease), lumbar 04/11/2021   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 03/09/2021   Palpitations 09/05/2020   SOB (shortness of breath) 09/05/2020   Chest pain  09/05/2020   Mixed hyperlipidemia 06/06/2020   Primary insomnia 06/06/2020   Primary parkinsonism (HCC) 10/26/2019   Osteoporosis 09/15/2019   Blister of great toe of left foot 08/19/2019   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   Moderate episode of recurrent major depressive disorder (HCC) 02/09/2019   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 02/09/2019   Shuffling gait 02/09/2019   Balance problems 02/09/2019   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   Orthostatic hypotension 02/01/2019   Dizziness 02/01/2019   Primary osteoarthritis of right foot 09/25/2018   Snoring 09/25/2018   Night terrors, adult 09/25/2018   Multiple falls 09/25/2018   Metatarsalgia of left foot 08/20/2018   Chronic foot pain, right 08/20/2018   Left hip pain 04/10/2017   History of shingles 04/08/2017   Adenomatous polyp 04/08/2017   Pure hypercholesterolemia 04/08/2017   History of kidney stones 04/08/2017   Essential hypertension 04/08/2017   Depression, recurrent (HCC) 04/08/2017   Anxiety 04/08/2017   Greater trochanteric bursitis of left hip 04/08/2017    PCP: Araceli Knight, PA-C   REFERRING PROVIDER: Shirline Dover, DO   REFERRING DIAG:  G20.A1 (ICD-10-CM) - Parkinson's disease without dyskinesia or fluctuating manifestations (HCC)  M41.9 (ICD-10-CM) - Scoliosis, unspecified scoliosis type, unspecified spinal region    THERAPY DIAG:  Cramp and spasm  Pain in right hip  Other low back pain  Unsteadiness on feet  Other abnormalities of gait and mobility  Muscle weakness (generalized)  Other symptoms and signs involving the nervous system  Balance disorder  Rationale for Evaluation and Treatment: Rehabilitation  ONSET DATE: one year  SUBJECTIVE:   SUBJECTIVE STATEMENT:  Patient states she goes barefoot frequently at home and finder her toes curl under and catch on the carpet. Patient states loading/unloading dishwasher is difficult due to back "catching".    PERTINENT  HISTORY: Osteoporosis (cannot take meds), PD, R L 4/5 laminectomy, HTN, scoliosis, h/o falls PAIN:  Are you having pain? Yes: NPRS scale: 0 today up to 8/10 Pain location: R lumbar Pain description: stabbing spasm Aggravating factors: loading dishwasher, standing in bathroom Relieving factors: sitting  Are you having pain? Yes: NPRS scale: 0/10 Pain location: R hip near greater trochanter Pain description: soreness Aggravating factors: walking, stairs and sit to stand Relieving factors: Ibuprofen and lidocaine  patch  PRECAUTIONS: Fall and Other: OSTEOPOROSIS  RED FLAGS: None   WEIGHT BEARING RESTRICTIONS: No  FALLS:  Has patient fallen in last 6 months? 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  DIAGNOSTIC FINDINGS: none  PATIENT SURVEYS:  The Patient-Specific Functional Scale  Initial:  I am going to ask you to identify up to 3 important activities that you are unable to do or are having difficulty with as a result of this problem.  Today are there any activities that you are unable to do or having difficulty with because of this?  (Patient shown scale and patient rated each activity)  Follow up: When you first came in you had difficulty performing these activities.  Today do you still have difficulty?  Patient-Specific activity scoring scheme (Point to one number):  0 1 2 3 4 5 6 7 8 9  10 Unable                                                                                                          Able to perform To perform                                                                                                     activity at the same Activity         Level as before  Injury or problem  Activity       Cooking/kitchen chores                                                                          Initial:      0                 follow up: 8 on 5/27  2.         Walking                                                                      Initial:         4              follow up: 7 on 5/27  3.         Sit to stand                                                                    Initial:        4               follow up: 7 on 5/27     COGNITION: Overall cognitive status: Within functional limits for tasks assessed     SENSATION: Not tested  MUSCLE LENGTH: B piriformis and HS tightness, R QL and lumbar  POSTURE: rounded shoulders, forward head, flexed trunk , and left thoracic R lumbar scoliosis (convexity)  PALPATION: Marked pain R QL, and R gluteals near greater trochanter  LUMBAR ROM:   Active  A/PROM  eval  Flexion WFL  Extension WFL  Right lateral flexion   Left lateral flexion   Right rotation NT  Left rotation NT   (Blank rows = not tested)   LOWER EXTREMITY ROM: WFL for tasks assessed  Active ROM Right eval Left eval  Hip flexion    Hip extension    Hip abduction    Hip adduction    Hip internal rotation 15* 20  Hip external rotation    Knee flexion    Knee extension    Ankle dorsiflexion    Ankle plantarflexion    Ankle inversion    Ankle eversion     (Blank rows = not tested) * cramping  LOWER EXTREMITY MMT: tested in sitting  MMT Right eval Left eval  Hip flexion 4 4  Hip extension    Hip abduction 4 4+  Hip adduction 5 5  Hip internal rotation    Hip external rotation    Knee flexion    Knee extension 4+ 4+  Ankle dorsiflexion 4+ 4+  Ankle plantarflexion    Ankle inversion     Ankle eversion     (Blank rows =  not tested)   FUNCTIONAL TESTS:  5 times sit to stand: 25.08 sec  04/20/24  13.35 sec GAIT: Distance walked: 20 Assistive device utilized: None Level of assistance: Complete Independence Comments: decreased heel strike, flexed knees and hips, short step length    [facility] Adult PT Treatment:                                                DATE: 05/14/2024 Therapeutic Exercise: LTR Figure 4 LTR Standing heel & toe raises Neuromuscular re-ed: Toe walking  Heel walking Tandem walking Tandem balance SLB Therapeutic Activity: Berg balance Walking + stepping over low obstacles   [facility] Adult PT Treatment:                                                DATE: 05/11/2024 Self Care: Discussed methods to help with gait freezing; use of rollator and use of brakes on declines MFR with firm ball to R lumbar Therapeutic Exercise: Counter: Gastroc stretch Toe raises Heel raises + 4" ball b/w ankles Heel walking and toe walking LTR causes spasm today so held Figure 4 LTR --> L direction only SKTC Core marching - back feels like it's going to spasm R S/L clam --> discontinued d/t R back cramping Seated green PB rollout stretch --> front & L diagonal Therapeutic Activity: Checked goals PSFS Split stance squats using 2 (3 min egg foams) as a target for knee 6" step --> step up/down x 5 B  [facility] Adult PT Treatment:                                                DATE: 05/05/2024 Therapeutic Exercise: Counter: Gastroc stretch Toe raises Heel raises + 4" ball b/w ankles LTR x 1 min Figure 4 LTR --> L direction only SKTC Core marching Bridges --> discontinued d/t R back cramping Seated green PB rollout stretch --> front & L diagonal Therapeutic Activity: Kneeling on airex, UE on table --> kneeling to standing transfers --> progressed to 1 hand on table, 1 hand braced on thigh Split stance squats 6" step --> step up/down x 1 min    [facility] Adult PT  Treatment:                                                DATE: 05/03/2024 Therapeutic Exercise: Standing gastroc stretch x20" bilateral Counter: Heel raises + 4" ball b/w ankles x20 Resisted hip abd + YTB 2x10 Resisted hip ext + YTB x10 Hip ext on diagonal (hands braced on wall) x10 Scap squeezes --> bkwd shoulder circles  Seated thoracic ext stretch with deflated coregeous ball Neuromuscular re-ed: Airex: Head turns --> double vision with head turns to Rt Lateral weight shifting --> progressing to low marching Stepping on/off airex --> close SBA  Therapeutic Activity: Marching + same knee taps 2x15' Marching + opp knee taps 2x15' Side step squat + 1#dowel chest press 2x8 Alt walking fwd x4 steps +  2 wide steps x 80' Wall push-ups --> elbows down x10, elbows out x10 STS cradling 4#DB + marching in standing x8   PATIENT EDUCATION:  Education details: Updated HEP  Person educated: Patient Education method: Explanation, Demonstration, and Handouts Education comprehension: verbalized understanding and returned demonstration  HOME EXERCISE PROGRAM: Access Code: [email or web address] URL: [email or web address] Date: 05/14/2024 Prepared by: Sims Duck  Exercises - Standing Lumbar Extension with Counter  - 3 x daily - 7 x weekly - 1 sets - 10 reps - Supine Figure 4 Piriformis Stretch  - 1 x daily - 7 x weekly - 3 sets - 10 reps - Supine Lower Trunk Rotation  - 1 x daily - 7 x weekly - 3 sets - 10 reps - QL Stretch Supine  - 1 x daily - 7 x weekly - 3 sets - 10 reps - Seated Isometric Hip Abduction with Belt  - 1 x daily - 7 x weekly - 1-2 sets - 10 reps - 5 sec hold - Wall Push Up  - 1 x daily - 3 x weekly - 2 sets - 10 reps - Tandem Walking with Counter Support  - 1 x daily - 7 x weekly - 3 sets - 10 reps - Heel Walking with Counter Support  - 1 x daily - 7 x weekly - 3 sets - 10 reps  ASSESSMENT:  CLINICAL IMPRESSION:  Berg Balance score improved by 5 points; patient  continues to be most challenged with single leg and NBOS activities. Toe and heel walking variations incorporated to progress safe foot clearance when walking. Step over obstacles added for foot clearance and reciprocal gait patterning.  EVAL: Patient is a 83 y.o. female who was seen today for physical therapy evaluation and treatment for muscles spasms secondary to her scoliosis and for balance deficits secondary to PD. She also reports new onset of R hip pain 5 days ago. The back spasms and hip pain are limiting her ability to walk, perform sit to stand, and perform standing kitchen and bathroom chores. She denies any falls in the past 6 months, but reports that her balance is "not good". She uses a walker intermittently, mainly at the EOD and if she gets up during the night. She has deficits in flexibility and strength as well as posture and balance. She will benefit from skilled PT to address these deficits.  She experienced back spasm when attempting to do a supine hip stretch, so initial trial of DN performed on R QL with good twitch response. Patient reported decreased pain at end of session and was able to move without spasm.  OBJECTIVE IMPAIRMENTS: Abnormal gait, decreased activity tolerance, decreased balance, decreased ROM, decreased strength, increased muscle spasms, impaired flexibility, postural dysfunction, and pain.   ACTIVITY LIMITATIONS: bending, sitting, standing, squatting, sleeping, stairs, transfers, bed mobility, bathing, dressing, hygiene/grooming, and locomotion level  PARTICIPATION LIMITATIONS: meal prep, cleaning, laundry, shopping, and community activity  PERSONAL FACTORS: Age, Fitness, Time since onset of injury/illness/exacerbation, and 3+ comorbidities: OP, PD, scoliosis, HTN are also affecting patient's functional outcome.   REHAB POTENTIAL: Good  CLINICAL DECISION MAKING: Unstable/unpredictable  EVALUATION COMPLEXITY: High   GOALS: Goals reviewed with patient?  Yes  SHORT TERM GOALS: Target date: 04/28/24 Decreased back spasms by >= 50% to improve QOL Baseline: Goal status: IN PROGRESS 25% 5/6  2.  Decreased hip pain by 50% with steps, transfers and walking. Baseline:  Goal status: MET  3.  Balance assessment completed at second visit and goals updated.  Baseline:  Goal status: MET   LONG TERM GOALS: Target date: 05/26/24  Patient able to perform ADLS without back spasms. Baseline:  Goal status: INITIAL  2.  Pt able to walk and climb stairs without R hip pain. Baseline:  Goal status: IN PROGRESS 75%  better 5/27  3.  Improved 5XSTS by 5 seconds demonstrating improved strength and decreasing risk for falls. Baseline: 25.08 seconds. Goal status: MET 04/20/24 13.35 sec  4.  Improved BERG score by 8 points showing functional improvement Baseline: 43/56 05/14/24: 48/56 Goal status: IN PROCESS  5.  Improve PSFS by 2-3 points  Baseline: see objective Goal status: MET 05/11/24    PLAN:  PT FREQUENCY: 2x/week  PT DURATION: 8 weeks  PLANNED INTERVENTIONS: 97164- PT Re-evaluation, 97110-Therapeutic exercises, 97530- Therapeutic activity, 97112- Neuromuscular re-education, 97535- Self Care, 16109- Manual therapy, [phone]- Gait training, [phone]- Electrical stimulation (unattended), [phone]- Ionotophoresis 4mg /ml Dexamethasone , Patient/Family education, Balance training, Stair training, Taping, Dry Needling, Joint mobilization, Cryotherapy, and Moist heat  PLAN FOR NEXT SESSION: Continue to Work on floor to stand transfers, UE strength to assist with this. Work on balance. Progress hip stretching, glute med strengthening, LE strength, gait and balance. PRECAUTION: OSTEOPOROSIS.    Sims Duck, PTA 05/14/2024, 10:16 AM

## 2024-05-17 NOTE — Therapy (Addendum)
 OUTPATIENT PHYSICAL THERAPY LUMBAR AND LE TREATMENT   Patient Name: Chelsey Estes MRN: [id number] DOB:1941/05/29, 83 y.o., female Today's Date: 05/18/2024  END OF SESSION:  PT End of Session - 05/18/24 1102     Visit Number 12    Date for PT Re-Evaluation 05/26/24    Authorization Type MCR    Progress Note Due on Visit 10    PT Start Time 1102    PT Stop Time 1145    PT Time Calculation (min) 43 min    Activity Tolerance Patient tolerated treatment well    Behavior During Therapy WFL for tasks assessed/performed             Past Medical History:  Diagnosis Date   Anxiety    Arthritis    Depression    Glaucoma    History of kidney stones    7 lithrotripsy   Hyperlipidemia    Hypertension    Parkinson disease (HCC)    Past Surgical History:  Procedure Laterality Date   ABDOMINAL HYSTERECTOMY     ANTERIOR (CYSTOCELE) AND POSTERIOR REPAIR (RECTOCELE) WITH XENFORM GRAFT AND SACROSPINOUS FIXATION     CATARACT EXTRACTION, BILATERAL     INGUINAL HERNIA REPAIR Left    kidney stones     LUMBAR LAMINECTOMY/DECOMPRESSION MICRODISCECTOMY Right 10/12/2021   Procedure: Right Lumbar four-five Laminectomy for facet/synovial cyst;  Surgeon: Joshua Alm RAMAN, MD;  Location: [facility] OR;  Service: Neurosurgery;  Laterality: Right;   Patient Active Problem List   Diagnosis Date Noted   Dysuria 02/25/2024   Leukocytes in urine 02/25/2024   Pruritus 10/17/2023   Heart palpitations 07/14/2023   History of hypokalemia 07/14/2023   Vitamin D  deficiency 12/02/2022   Kidney stone 11/22/2022   Tinnitus of left ear 07/03/2022   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 07/03/2022   Decreased GFR 04/17/2022   Medication management 04/16/2022   Synovial cyst of lumbar facet joint 09/19/2021   DDD (degenerative disc disease), lumbar 04/11/2021   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 03/09/2021   Palpitations 09/05/2020   SOB (shortness  of breath) 09/05/2020   Chest pain 09/05/2020   Mixed hyperlipidemia 06/06/2020   Primary insomnia 06/06/2020   Primary parkinsonism (HCC) 10/26/2019   Osteoporosis 09/15/2019   Blister of great toe of left foot 08/19/2019   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   Moderate episode of recurrent major depressive disorder (HCC) 02/09/2019   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 02/09/2019   Shuffling gait 02/09/2019   Balance problems 02/09/2019   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   Orthostatic hypotension 02/01/2019   Dizziness 02/01/2019   Primary osteoarthritis of right foot 09/25/2018   Snoring 09/25/2018   Night terrors, adult 09/25/2018   Multiple falls 09/25/2018   Metatarsalgia of left foot 08/20/2018   Chronic foot pain, right 08/20/2018   Left hip pain 04/10/2017   History of shingles 04/08/2017   Adenomatous polyp 04/08/2017   Pure hypercholesterolemia 04/08/2017   History of kidney stones 04/08/2017   Essential hypertension 04/08/2017   Depression, recurrent (HCC) 04/08/2017   Anxiety 04/08/2017   Greater trochanteric bursitis of left hip 04/08/2017    PCP: Antoniette Vermell CROME, PA-C   REFERRING PROVIDER: Evonnie Asberry RAMAN, DO   REFERRING DIAG:  G20.A1 (ICD-10-CM) - Parkinson's disease without dyskinesia or fluctuating manifestations (HCC)  M41.9 (ICD-10-CM) - Scoliosis, unspecified scoliosis type, unspecified spinal region    THERAPY DIAG:  Cramp and spasm  Pain in right hip  Other low back pain  Unsteadiness on feet  Other abnormalities of gait and mobility  Muscle weakness (generalized)  Rationale for Evaluation and Treatment: Rehabilitation  ONSET DATE: one year  SUBJECTIVE:   SUBJECTIVE STATEMENT:  Patient fell coming in clinic when the door hit her from behind. She fell on he R knee and L hip. She denies pain.    PERTINENT HISTORY: Osteoporosis (cannot take meds), PD, R L 4/5 laminectomy, HTN, scoliosis, h/o falls PAIN:  Are you having  pain? Yes: NPRS scale: 0 today up to 8/10 Pain location: R lumbar Pain description: stabbing spasm Aggravating factors: loading dishwasher, standing in bathroom Relieving factors: sitting  Are you having pain? Yes: NPRS scale: 0/10 Pain location: R hip near greater trochanter Pain description: soreness Aggravating factors: walking, stairs and sit to stand Relieving factors: Ibuprofen and lidocaine  patch  PRECAUTIONS: Fall and Other: OSTEOPOROSIS  RED FLAGS: None   WEIGHT BEARING RESTRICTIONS: No  FALLS:  Has patient fallen in last 6 months? 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  DIAGNOSTIC FINDINGS: none  PATIENT SURVEYS:  The Patient-Specific Functional Scale  Initial:  I am going to ask you to identify up to 3 important activities that you are unable to do or are having difficulty with as a result of this problem.  Today are there any activities that you are unable to do or having difficulty with because of this?  (Patient shown scale and patient rated each activity)  Follow up: When you first came in you had difficulty performing these activities.  Today do you still have difficulty?  Patient-Specific activity scoring scheme (Point to one number):  0 1 2 3 4 5 6 7 8 9  10 Unable                                                                                                          Able to perform To perform                                                                                                    activity at the same Activity         Level as before  Injury or problem  Activity       Cooking/kitchen chores                                                                          Initial:      0                 follow up: 8 on 5/27  2.         Walking                                                                      Initial:         4              follow up: 7 on 5/27  3.         Sit to stand                                                                    Initial:        4               follow up: 7 on 5/27     COGNITION: Overall cognitive status: Within functional limits for tasks assessed     SENSATION: Not tested  MUSCLE LENGTH: B piriformis and HS tightness, R QL and lumbar  POSTURE: rounded shoulders, forward head, flexed trunk , and left thoracic R lumbar scoliosis (convexity)  PALPATION: Marked pain R QL, and R gluteals near greater trochanter  LUMBAR ROM:   Active  A/PROM  eval  Flexion WFL  Extension WFL  Right lateral flexion   Left lateral flexion   Right rotation NT  Left rotation NT   (Blank rows = not tested)   LOWER EXTREMITY ROM: WFL for tasks assessed  Active ROM Right eval Left eval  Hip flexion    Hip extension    Hip abduction    Hip adduction    Hip internal rotation 15* 20  Hip external rotation    Knee flexion    Knee extension    Ankle dorsiflexion    Ankle plantarflexion    Ankle inversion    Ankle eversion     (Blank rows = not tested) * cramping  LOWER EXTREMITY MMT: tested in sitting  MMT Right eval Left eval  Hip flexion 4 4  Hip extension    Hip abduction 4 4+  Hip adduction 5 5  Hip internal rotation    Hip external rotation    Knee flexion    Knee extension 4+ 4+  Ankle dorsiflexion 4+ 4+  Ankle plantarflexion    Ankle inversion    Ankle eversion     (Blank rows = not  tested)   FUNCTIONAL TESTS:  5 times sit to stand: 25.08 sec  04/20/24   13.35 sec GAIT: Distance walked: 20 Assistive device utilized: None Level of assistance: Complete Independence Comments: decreased heel strike, flexed knees and hips, short step length   [facility] Adult PT Treatment:                                                DATE: 05/18/2024 Therapeutic Exercise: Seated hip flex 3# wts on knees x 10 ea Seated clam green TB x 20 Standing heel & toe raises Seated modified russian twist 3# wt x 10 mild pain in R low back so stopped Neuromuscular re-ed: Heel taps 4 inch step x 10 ea Lateral step ups 4 inch step 4 inch up and over R x 10, then 6 inch up and over  Therapeutic Activity: Right ankle assessed after discussion of fall and status 4 inch up and over R x 10, then 6 inch up and over x 10 Fwd step throughs in upside down 6 inch step Gait with SPC x 1 lap and then throughout visit during change of location      [facility] Adult PT Treatment:                                                DATE: 05/14/2024 Therapeutic Exercise: LTR Figure 4 LTR Standing heel & toe raises Neuromuscular re-ed: Toe walking  Heel walking Tandem walking Tandem balance SLB Therapeutic Activity: Berg balance Walking + stepping over low obstacles   [facility] Adult PT Treatment:                                                DATE: 05/11/2024 Self Care: Discussed methods to help with gait freezing; use of rollator and use of brakes on declines MFR with firm ball to R lumbar Therapeutic Exercise: Counter: Gastroc stretch Toe raises Heel raises + 4 ball b/w ankles Heel walking and toe walking LTR causes spasm today so held Figure 4 LTR --> L direction only SKTC Core marching - back feels like it's going to spasm R S/L clam --> discontinued d/t R back cramping Seated green PB rollout stretch --> front & L diagonal Therapeutic Activity: Checked goals PSFS Split stance squats using 2 (3 min egg foams) as a target for knee 6 step --> step up/down x 5 B  [facility] Adult  PT Treatment:                                                DATE: 05/05/2024 Therapeutic Exercise: Counter: Gastroc stretch Toe raises Heel raises + 4 ball b/w ankles LTR x 1 min Figure 4 LTR --> L direction only SKTC Core marching Bridges --> discontinued d/t R back cramping Seated green PB rollout stretch --> front & L diagonal Therapeutic Activity: Kneeling on airex, UE on table --> kneeling to standing transfers --> progressed to 1 hand on table, 1 hand  braced on thigh Split stance squats 6 step --> step up/down x 1 min    [facility] Adult PT Treatment:                                                DATE: 05/03/2024 Therapeutic Exercise: Standing gastroc stretch x20 bilateral Counter: Heel raises + 4 ball b/w ankles x20 Resisted hip abd + YTB 2x10 Resisted hip ext + YTB x10 Hip ext on diagonal (hands braced on wall) x10 Scap squeezes --> bkwd shoulder circles  Seated thoracic ext stretch with deflated coregeous ball Neuromuscular re-ed: Airex: Head turns --> double vision with head turns to Rt Lateral weight shifting --> progressing to low marching Stepping on/off airex --> close SBA  Therapeutic Activity: Marching + same knee taps 2x15' Marching + opp knee taps 2x15' Side step squat + 1#dowel chest press 2x8 Alt walking fwd x4 steps + 2 wide steps x 80' Wall push-ups --> elbows down x10, elbows out x10 STS cradling 4#DB + marching in standing x8   PATIENT EDUCATION:  Education details: Updated HEP  Person educated: Patient Education method: Explanation, Demonstration, and Handouts Education comprehension: verbalized understanding and returned demonstration  HOME EXERCISE PROGRAM: Access Code: [email or web address] URL: [email or web address] Date: 05/14/2024 Prepared by: Lamarr Price  Exercises - Standing Lumbar Extension with Counter  - 3 x daily - 7 x weekly - 1 sets - 10 reps - Supine Figure 4 Piriformis Stretch  - 1 x daily - 7 x weekly - 3 sets - 10  reps - Supine Lower Trunk Rotation  - 1 x daily - 7 x weekly - 3 sets - 10 reps - QL Stretch Supine  - 1 x daily - 7 x weekly - 3 sets - 10 reps - Seated Isometric Hip Abduction with Belt  - 1 x daily - 7 x weekly - 1-2 sets - 10 reps - 5 sec hold - Wall Push Up  - 1 x daily - 3 x weekly - 2 sets - 10 reps - Tandem Walking with Counter Support  - 1 x daily - 7 x weekly - 3 sets - 10 reps - Heel Walking with Counter Support  - 1 x daily - 7 x weekly - 3 sets - 10 reps  ASSESSMENT:  CLINICAL IMPRESSION:  Patient fell today when entering lobby as the door hit her from behind. She fell on her R knee and L hip and was helped to standing by another patient. She denies pain. She had some soreness in the R ankle initially, but by end of session it felt normal. Ankle was assessed for ROM and MMT and was WNL and no pain reported. We started session walking with a SPC as patient reports she is having some issues with depth perception at home. She uses furniture as support and has been breaking her nails reaching for things. She demonstrated much safer and normalized gait pattern using SPC. Patient plans to purchase a cane right away. She tolerated all exercises well today, but she was reluctant to do balance exercises secondary to her fall. Plan to resume next visit.   EVAL: Patient is a 83 y.o. female who was seen today for physical therapy evaluation and treatment for muscles spasms secondary to her scoliosis and for balance deficits secondary to PD. She also reports new onset of R hip pain 5  days ago. The back spasms and hip pain are limiting her ability to walk, perform sit to stand, and perform standing kitchen and bathroom chores. She denies any falls in the past 6 months, but reports that her balance is not good. She uses a walker intermittently, mainly at the EOD and if she gets up during the night. She has deficits in flexibility and strength as well as posture and balance. She will benefit from skilled  PT to address these deficits.  She experienced back spasm when attempting to do a supine hip stretch, so initial trial of DN performed on R QL with good twitch response. Patient reported decreased pain at end of session and was able to move without spasm.  OBJECTIVE IMPAIRMENTS: Abnormal gait, decreased activity tolerance, decreased balance, decreased ROM, decreased strength, increased muscle spasms, impaired flexibility, postural dysfunction, and pain.   ACTIVITY LIMITATIONS: bending, sitting, standing, squatting, sleeping, stairs, transfers, bed mobility, bathing, dressing, hygiene/grooming, and locomotion level  PARTICIPATION LIMITATIONS: meal prep, cleaning, laundry, shopping, and community activity  PERSONAL FACTORS: Age, Fitness, Time since onset of injury/illness/exacerbation, and 3+ comorbidities: OP, PD, scoliosis, HTN are also affecting patient's functional outcome.   REHAB POTENTIAL: Good  CLINICAL DECISION MAKING: Unstable/unpredictable  EVALUATION COMPLEXITY: High   GOALS: Goals reviewed with patient? Yes  SHORT TERM GOALS: Target date: 04/28/24 Decreased back spasms by >= 50% to improve QOL Baseline: Goal status: IN PROGRESS 25% 5/6  2.  Decreased hip pain by 50% with steps, transfers and walking. Baseline:  Goal status: MET  3.  Balance assessment completed at second visit and goals updated. Baseline:  Goal status: MET   LONG TERM GOALS: Target date: 05/26/24  Patient able to perform ADLS without back spasms. Baseline:  Goal status: INITIAL  2.  Pt able to walk and climb stairs without R hip pain. Baseline:  Goal status: IN PROGRESS 75%  better 5/27  3.  Improved 5XSTS by 5 seconds demonstrating improved strength and decreasing risk for falls. Baseline: 25.08 seconds. Goal status: MET 04/20/24 13.35 sec  4.  Improved BERG score by 8 points showing functional improvement Baseline: 43/56 05/14/24: 48/56 Goal status: IN PROCESS  5.  Improve PSFS by 2-3  points  Baseline: see objective Goal status: MET 05/11/24    PLAN:  PT FREQUENCY: 2x/week  PT DURATION: 8 weeks  PLANNED INTERVENTIONS: 97164- PT Re-evaluation, 97110-Therapeutic exercises, 97530- Therapeutic activity, 97112- Neuromuscular re-education, 97535- Self Care, [id number]- Manual therapy, [phone]- Gait training, [phone]- Electrical stimulation (unattended), [phone]- Ionotophoresis 4mg /ml Dexamethasone , Patient/Family education, Balance training, Stair training, Taping, Dry Needling, Joint mobilization, Cryotherapy, and Moist heat  PLAN FOR NEXT SESSION: Continue to Work on floor to stand transfers, UE strength to assist with this. Work on balance. Progress hip stretching, glute med strengthening, LE strength, gait and balance. PRECAUTION: OSTEOPOROSIS.    Mliss Cummins, PT  05/18/2024, 12:48 PM

## 2024-05-18 ENCOUNTER — Ambulatory Visit: Attending: Neurology | Admitting: Physical Therapy

## 2024-05-18 ENCOUNTER — Encounter: Payer: Self-pay | Admitting: Physical Therapy

## 2024-05-18 DIAGNOSIS — R2689 Other abnormalities of gait and mobility: Secondary | ICD-10-CM | POA: Diagnosis present

## 2024-05-18 DIAGNOSIS — R252 Cramp and spasm: Secondary | ICD-10-CM | POA: Diagnosis present

## 2024-05-18 DIAGNOSIS — R2681 Unsteadiness on feet: Secondary | ICD-10-CM | POA: Insufficient documentation

## 2024-05-18 DIAGNOSIS — M5459 Other low back pain: Secondary | ICD-10-CM | POA: Diagnosis present

## 2024-05-18 DIAGNOSIS — M25551 Pain in right hip: Secondary | ICD-10-CM | POA: Insufficient documentation

## 2024-05-18 DIAGNOSIS — R29818 Other symptoms and signs involving the nervous system: Secondary | ICD-10-CM | POA: Insufficient documentation

## 2024-05-18 DIAGNOSIS — M6281 Muscle weakness (generalized): Secondary | ICD-10-CM | POA: Insufficient documentation

## 2024-05-19 NOTE — Therapy (Incomplete)
 OUTPATIENT PHYSICAL THERAPY LUMBAR AND LE TREATMENT   Patient Name: Chelsey Estes MRN: [id number] DOB:01-02-1941, 83 y.o., female Today's Date: 05/19/2024  END OF SESSION:    Past Medical History:  Diagnosis Date   Anxiety    Arthritis    Depression    Glaucoma    History of kidney stones    7 lithrotripsy   Hyperlipidemia    Hypertension    Parkinson disease (HCC)    Past Surgical History:  Procedure Laterality Date   ABDOMINAL HYSTERECTOMY     ANTERIOR (CYSTOCELE) AND POSTERIOR REPAIR (RECTOCELE) WITH XENFORM GRAFT AND SACROSPINOUS FIXATION     CATARACT EXTRACTION, BILATERAL     INGUINAL HERNIA REPAIR Left    kidney stones     LUMBAR LAMINECTOMY/DECOMPRESSION MICRODISCECTOMY Right 10/12/2021   Procedure: Right Lumbar four-five Laminectomy for facet/synovial cyst;  Surgeon: Isadora Mar, MD;  Location: [facility] OR;  Service: Neurosurgery;  Laterality: Right;   Patient Active Problem List   Diagnosis Date Noted   Dysuria 02/25/2024   Leukocytes in urine 02/25/2024   Pruritus 10/17/2023   Heart palpitations 07/14/2023   History of hypokalemia 07/14/2023   Vitamin D  deficiency 12/02/2022   Kidney stone 11/22/2022   Tinnitus of left ear 07/03/2022   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 07/03/2022   Decreased GFR 04/17/2022   Medication management 04/16/2022   Synovial cyst of lumbar facet joint 09/19/2021   DDD (degenerative disc disease), lumbar 04/11/2021   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 03/09/2021   Palpitations 09/05/2020   SOB (shortness of breath) 09/05/2020   Chest pain 09/05/2020   Mixed hyperlipidemia 06/06/2020   Primary insomnia 06/06/2020   Primary parkinsonism (HCC) 10/26/2019   Osteoporosis 09/15/2019   Blister of great toe of left foot 08/19/2019   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   Moderate episode of recurrent major depressive disorder (HCC) 02/09/2019   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 02/09/2019   Shuffling gait 02/09/2019   Balance problems 02/09/2019   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   Orthostatic hypotension 02/01/2019   Dizziness 02/01/2019   Primary osteoarthritis of right foot 09/25/2018   Snoring 09/25/2018   Night terrors, adult 09/25/2018   Multiple falls 09/25/2018   Metatarsalgia of left foot 08/20/2018   Chronic foot pain, right 08/20/2018   Left hip pain 04/10/2017   History of shingles 04/08/2017   Adenomatous polyp 04/08/2017   Pure hypercholesterolemia 04/08/2017   History of kidney stones 04/08/2017   Essential hypertension 04/08/2017   Depression, recurrent (HCC) 04/08/2017   Anxiety 04/08/2017   Greater trochanteric bursitis of left hip 04/08/2017    PCP: Araceli Knight, PA-C   REFERRING PROVIDER: Shirline Dover, DO   REFERRING DIAG:  G20.A1 (ICD-10-CM) - Parkinson's disease without dyskinesia or fluctuating manifestations (HCC)  M41.9 (ICD-10-CM) - Scoliosis, unspecified scoliosis type, unspecified spinal region    THERAPY DIAG:  No diagnosis found.  Rationale for Evaluation and Treatment: Rehabilitation  ONSET DATE: one year  SUBJECTIVE:   SUBJECTIVE STATEMENT:  ***.    PERTINENT HISTORY: Osteoporosis (cannot take meds), PD, R L 4/5 laminectomy, HTN, scoliosis, h/o falls PAIN:  Are you having pain? Yes: NPRS scale: 0 today up to 8/10 Pain location: R lumbar Pain description: stabbing spasm Aggravating factors: loading dishwasher, standing in bathroom Relieving factors: sitting  Are you having pain? Yes: NPRS scale: 0/10 Pain location: R hip near greater trochanter Pain description: soreness Aggravating factors: walking, stairs and  sit to stand Relieving factors: Ibuprofen and lidocaine  patch  PRECAUTIONS: Fall and Other: OSTEOPOROSIS  RED FLAGS: None   WEIGHT BEARING RESTRICTIONS: No  FALLS:  Has patient fallen in last 6 months? No  LIVING ENVIRONMENT: Lives with: lives with  their family and lives alone Lives in: House/apartment Stairs: Yes: External: 7 steps; can reach both Has following equipment at home: Retail banker - 2 wheeled  OCCUPATION: retired  PLOF: Independent  PATIENT GOALS: get rid of pain, walk better, improve balance  NEXT MD VISIT: October  OBJECTIVE:  Note: Objective measures were completed at Evaluation unless otherwise noted.  DIAGNOSTIC FINDINGS: none  PATIENT SURVEYS:  The Patient-Specific Functional Scale  Initial:  I am going to ask you to identify up to 3 important activities that you are unable to do or are having difficulty with as a result of this problem.  Today are there any activities that you are unable to do or having difficulty with because of this?  (Patient shown scale and patient rated each activity)  Follow up: When you first came in you had difficulty performing these activities.  Today do you still have difficulty?  Patient-Specific activity scoring scheme (Point to one number):  0 1 2 3 4 5 6 7 8 9  10 Unable                                                                                                          Able to perform To perform                                                                                                    activity at the same Activity         Level as before                                                                                                                       Injury or problem  Activity       Cooking/kitchen chores  Initial:      0                 follow up: 8 on 5/27  2.         Walking                                                                      Initial:         4              follow up: 7 on 5/27  3.         Sit to stand                                                                    Initial:        4               follow up: 7 on 5/27      COGNITION: Overall cognitive status: Within functional limits for tasks assessed     SENSATION: Not tested  MUSCLE LENGTH: B piriformis and HS tightness, R QL and lumbar  POSTURE: rounded shoulders, forward head, flexed trunk , and left thoracic R lumbar scoliosis (convexity)  PALPATION: Marked pain R QL, and R gluteals near greater trochanter  LUMBAR ROM:   Active  A/PROM  eval  Flexion WFL  Extension WFL  Right lateral flexion   Left lateral flexion   Right rotation NT  Left rotation NT   (Blank rows = not tested)   LOWER EXTREMITY ROM: WFL for tasks assessed  Active ROM Right eval Left eval  Hip flexion    Hip extension    Hip abduction    Hip adduction    Hip internal rotation 15* 20  Hip external rotation    Knee flexion    Knee extension    Ankle dorsiflexion    Ankle plantarflexion    Ankle inversion    Ankle eversion     (Blank rows = not tested) * cramping  LOWER EXTREMITY MMT: tested in sitting  MMT Right eval Left eval  Hip flexion 4 4  Hip extension    Hip abduction 4 4+  Hip adduction 5 5  Hip internal rotation    Hip external rotation    Knee flexion    Knee extension 4+ 4+  Ankle dorsiflexion 4+ 4+  Ankle plantarflexion    Ankle inversion    Ankle eversion     (Blank rows = not tested)   FUNCTIONAL TESTS:  5 times sit to stand: 25.08 sec  04/20/24  13.35 sec GAIT: Distance walked: 20 Assistive device utilized: None Level of assistance: Complete Independence Comments: decreased heel strike, flexed knees and hips, short step length   [facility] Adult PT Treatment:  DATE: 05/20/2024 RECERT?  Therapeutic Exercise: Seated hip flex 3# wts on knees x 10 ea Seated clam green TB x 20 Standing heel & toe raises Neuromuscular re-ed: Heel taps 4 inch step x 10 ea Lateral step ups 4 inch step 4 inch up and over R x 10, then 6 inch up and over  Therapeutic Activity: Right ankle assessed  after discussion of fall and status 4 inch up and over R x 10, then 6 inch up and over x 10 Fwd step throughs in upside down 6 inch step Gait with SPC x 1 lap and then throughout visit during change of location Gait over obstacles   [facility] Adult PT Treatment:                                                DATE: 05/18/2024 Therapeutic Exercise: Seated hip flex 3# wts on knees x 10 ea Seated clam green TB x 20 Standing heel & toe raises Seated modified russian twist 3# wt x 10 mild pain in R low back so stopped Neuromuscular re-ed: Heel taps 4 inch step x 10 ea Lateral step ups 4 inch step 4 inch up and over R x 10, then 6 inch up and over  Therapeutic Activity: Right ankle assessed after discussion of fall and status 4 inch up and over R x 10, then 6 inch up and over x 10 Fwd step throughs in upside down 6 inch step Gait with SPC x 1 lap and then throughout visit during change of location      [facility] Adult PT Treatment:                                                DATE: 05/14/2024 Therapeutic Exercise: LTR Figure 4 LTR Standing heel & toe raises Neuromuscular re-ed: Toe walking  Heel walking Tandem walking Tandem balance SLB Therapeutic Activity: Berg balance Walking + stepping over low obstacles   [facility] Adult PT Treatment:                                                DATE: 05/11/2024 Self Care: Discussed methods to help with gait freezing; use of rollator and use of brakes on declines MFR with firm ball to R lumbar Therapeutic Exercise: Counter: Gastroc stretch Toe raises Heel raises + 4" ball b/w ankles Heel walking and toe walking LTR causes spasm today so held Figure 4 LTR --> L direction only SKTC Core marching - back feels like it's going to spasm R S/L clam --> discontinued d/t R back cramping Seated green PB rollout stretch --> front & L diagonal Therapeutic Activity: Checked goals PSFS Split stance squats using 2 (3 min egg foams) as a target for  knee 6" step --> step up/down x 5 B  [facility] Adult PT Treatment:  DATE: 05/05/2024 Therapeutic Exercise: Counter: Gastroc stretch Toe raises Heel raises + 4" ball b/w ankles LTR x 1 min Figure 4 LTR --> L direction only SKTC Core marching Bridges --> discontinued d/t R back cramping Seated green PB rollout stretch --> front & L diagonal Therapeutic Activity: Kneeling on airex, UE on table --> kneeling to standing transfers --> progressed to 1 hand on table, 1 hand braced on thigh Split stance squats 6" step --> step up/down x 1 min    [facility] Adult PT Treatment:                                                DATE: 05/03/2024 Therapeutic Exercise: Standing gastroc stretch x20" bilateral Counter: Heel raises + 4" ball b/w ankles x20 Resisted hip abd + YTB 2x10 Resisted hip ext + YTB x10 Hip ext on diagonal (hands braced on wall) x10 Scap squeezes --> bkwd shoulder circles  Seated thoracic ext stretch with deflated coregeous ball Neuromuscular re-ed: Airex: Head turns --> double vision with head turns to Rt Lateral weight shifting --> progressing to low marching Stepping on/off airex --> close SBA  Therapeutic Activity: Marching + same knee taps 2x15' Marching + opp knee taps 2x15' Side step squat + 1#dowel chest press 2x8 Alt walking fwd x4 steps + 2 wide steps x 80' Wall push-ups --> elbows down x10, elbows out x10 STS cradling 4#DB + marching in standing x8   PATIENT EDUCATION:  Education details: Updated HEP  Person educated: Patient Education method: Explanation, Demonstration, and Handouts Education comprehension: verbalized understanding and returned demonstration  HOME EXERCISE PROGRAM: Access Code: [email or web address] URL: [email or web address] Date: 05/14/2024 Prepared by: Sims Duck  Exercises - Standing Lumbar Extension with Counter  - 3 x daily - 7 x weekly - 1 sets - 10 reps - Supine Figure 4  Piriformis Stretch  - 1 x daily - 7 x weekly - 3 sets - 10 reps - Supine Lower Trunk Rotation  - 1 x daily - 7 x weekly - 3 sets - 10 reps - QL Stretch Supine  - 1 x daily - 7 x weekly - 3 sets - 10 reps - Seated Isometric Hip Abduction with Belt  - 1 x daily - 7 x weekly - 1-2 sets - 10 reps - 5 sec hold - Wall Push Up  - 1 x daily - 3 x weekly - 2 sets - 10 reps - Tandem Walking with Counter Support  - 1 x daily - 7 x weekly - 3 sets - 10 reps - Heel Walking with Counter Support  - 1 x daily - 7 x weekly - 3 sets - 10 reps  ASSESSMENT:  CLINICAL IMPRESSION:  ***  EVAL: Patient is a 83 y.o. female who was seen today for physical therapy evaluation and treatment for muscles spasms secondary to her scoliosis and for balance deficits secondary to PD. She also reports new onset of R hip pain 5 days ago. The back spasms and hip pain are limiting her ability to walk, perform sit to stand, and perform standing kitchen and bathroom chores. She denies any falls in the past 6 months, but reports that her balance is "not good". She uses a walker intermittently, mainly at the EOD and if she gets up during the night. She has deficits in flexibility and strength as well as posture  and balance. She will benefit from skilled PT to address these deficits.  She experienced back spasm when attempting to do a supine hip stretch, so initial trial of DN performed on R QL with good twitch response. Patient reported decreased pain at end of session and was able to move without spasm.  OBJECTIVE IMPAIRMENTS: Abnormal gait, decreased activity tolerance, decreased balance, decreased ROM, decreased strength, increased muscle spasms, impaired flexibility, postural dysfunction, and pain.   ACTIVITY LIMITATIONS: bending, sitting, standing, squatting, sleeping, stairs, transfers, bed mobility, bathing, dressing, hygiene/grooming, and locomotion level  PARTICIPATION LIMITATIONS: meal prep, cleaning, laundry, shopping, and  community activity  PERSONAL FACTORS: Age, Fitness, Time since onset of injury/illness/exacerbation, and 3+ comorbidities: OP, PD, scoliosis, HTN are also affecting patient's functional outcome.   REHAB POTENTIAL: Good  CLINICAL DECISION MAKING: Unstable/unpredictable  EVALUATION COMPLEXITY: High   GOALS: Goals reviewed with patient? Yes  SHORT TERM GOALS: Target date: 04/28/24 Decreased back spasms by >= 50% to improve QOL Baseline: Goal status: IN PROGRESS 25% 5/6  2.  Decreased hip pain by 50% with steps, transfers and walking. Baseline:  Goal status: MET  3.  Balance assessment completed at second visit and goals updated. Baseline:  Goal status: MET   LONG TERM GOALS: Target date: 05/26/24  Patient able to perform ADLS without back spasms. Baseline:  Goal status: INITIAL  2.  Pt able to walk and climb stairs without R hip pain. Baseline:  Goal status: IN PROGRESS 75%  better 5/27  3.  Improved 5XSTS by 5 seconds demonstrating improved strength and decreasing risk for falls. Baseline: 25.08 seconds. Goal status: MET 04/20/24 13.35 sec  4.  Improved BERG score by 8 points showing functional improvement Baseline: 43/56 05/14/24: 48/56 Goal status: IN PROCESS  5.  Improve PSFS by 2-3 points  Baseline: see objective Goal status: MET 05/11/24    PLAN:  PT FREQUENCY: 2x/week  PT DURATION: 8 weeks  PLANNED INTERVENTIONS: [id number]- PT Re-evaluation, 97110-Therapeutic exercises, 97530- Therapeutic activity, 97112- Neuromuscular re-education, 97535- Self Care, 27253- Manual therapy, [phone]- Gait training, [phone]- Electrical stimulation (unattended), [phone]- Ionotophoresis 4mg /ml Dexamethasone , Patient/Family education, Balance training, Stair training, Taping, Dry Needling, Joint mobilization, Cryotherapy, and Moist heat  PLAN FOR NEXT SESSION: Continue to Work on floor to stand transfers, UE strength to assist with this. Work on balance. Progress hip stretching, glute med  strengthening, LE strength, gait and balance. PRECAUTION: OSTEOPOROSIS.    Jinx Mourning, PT  05/19/2024, 3:22 PM

## 2024-05-20 ENCOUNTER — Ambulatory Visit: Admitting: Physical Therapy

## 2024-05-31 NOTE — Therapy (Signed)
 OUTPATIENT PHYSICAL THERAPY LUMBAR AND LE TREATMENT AND RECERTIFICATION   Patient Name: Chelsey Estes MRN: [id number] DOB:January 08, 1941, 83 y.o., female Today's Date: 05/31/2024  END OF SESSION:    Past Medical History:  Diagnosis Date   Anxiety    Arthritis    Depression    Glaucoma    History of kidney stones    7 lithrotripsy   Hyperlipidemia    Hypertension    Parkinson disease (HCC)    Past Surgical History:  Procedure Laterality Date   ABDOMINAL HYSTERECTOMY     ANTERIOR (CYSTOCELE) AND POSTERIOR REPAIR (RECTOCELE) WITH XENFORM GRAFT AND SACROSPINOUS FIXATION     CATARACT EXTRACTION, BILATERAL     INGUINAL HERNIA REPAIR Left    kidney stones     LUMBAR LAMINECTOMY/DECOMPRESSION MICRODISCECTOMY Right 10/12/2021   Procedure: Right Lumbar four-five Laminectomy for facet/synovial cyst;  Surgeon: Isadora Mar, MD;  Location: [facility] OR;  Service: Neurosurgery;  Laterality: Right;   Patient Active Problem List   Diagnosis Date Noted   Dysuria 02/25/2024   Leukocytes in urine 02/25/2024   Pruritus 10/17/2023   Heart palpitations 07/14/2023   History of hypokalemia 07/14/2023   Vitamin D  deficiency 12/02/2022   Kidney stone 11/22/2022   Tinnitus of left ear 07/03/2022   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 07/03/2022   Decreased GFR 04/17/2022   Medication management 04/16/2022   Synovial cyst of lumbar facet joint 09/19/2021   DDD (degenerative disc disease), lumbar 04/11/2021   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 03/09/2021   Palpitations 09/05/2020   SOB (shortness of breath) 09/05/2020   Chest pain 09/05/2020   Mixed hyperlipidemia 06/06/2020   Primary insomnia 06/06/2020   Primary parkinsonism (HCC) 10/26/2019   Osteoporosis 09/15/2019   Blister of great toe of left foot 08/19/2019   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   Moderate episode of recurrent major depressive disorder (HCC) 02/09/2019    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 02/09/2019   Shuffling gait 02/09/2019   Balance problems 02/09/2019   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   Orthostatic hypotension 02/01/2019   Dizziness 02/01/2019   Primary osteoarthritis of right foot 09/25/2018   Snoring 09/25/2018   Night terrors, adult 09/25/2018   Multiple falls 09/25/2018   Metatarsalgia of left foot 08/20/2018   Chronic foot pain, right 08/20/2018   Left hip pain 04/10/2017   History of shingles 04/08/2017   Adenomatous polyp 04/08/2017   Pure hypercholesterolemia 04/08/2017   History of kidney stones 04/08/2017   Essential hypertension 04/08/2017   Depression, recurrent (HCC) 04/08/2017   Anxiety 04/08/2017   Greater trochanteric bursitis of left hip 04/08/2017    PCP: Araceli Knight, PA-C   REFERRING PROVIDER: Shirline Dover, DO   REFERRING DIAG:  G20.A1 (ICD-10-CM) - Parkinson's disease without dyskinesia or fluctuating manifestations (HCC)  M41.9 (ICD-10-CM) - Scoliosis, unspecified scoliosis type, unspecified spinal region    THERAPY DIAG:  No diagnosis found.  Rationale for Evaluation and Treatment: Rehabilitation  ONSET DATE: one year  SUBJECTIVE:   SUBJECTIVE STATEMENT:  ***.    PERTINENT HISTORY: Osteoporosis (cannot take meds), PD, R L 4/5 laminectomy, HTN, scoliosis, h/o falls PAIN:  Are you having pain? Yes: NPRS scale: 0 today up to 8/10 Pain location: R lumbar Pain description: stabbing spasm Aggravating factors: loading dishwasher, standing in bathroom Relieving factors: sitting  Are you having pain? Yes: NPRS scale: 0/10 Pain location: R hip near greater trochanter Pain description: soreness Aggravating factors: walking,  stairs and sit to stand Relieving factors: Ibuprofen and lidocaine  patch  PRECAUTIONS: Fall and Other: OSTEOPOROSIS  RED FLAGS: None   WEIGHT BEARING RESTRICTIONS: No  FALLS:  Has patient fallen in last 6 months? 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  DIAGNOSTIC FINDINGS: none  PATIENT SURVEYS:  The Patient-Specific Functional Scale  Initial:  I am going to ask you to identify up to 3 important activities that you are unable to do or are having difficulty with as a result of this problem.  Today are there any activities that you are unable to do or having difficulty with because of this?  (Patient shown scale and patient rated each activity)  Follow up: When you first came in you had difficulty performing these activities.  Today do you still have difficulty?  Patient-Specific activity scoring scheme (Point to one number):  0 1 2 3 4 5 6 7 8 9  10 Unable                                                                                                          Able to perform To perform                                                                                                    activity at the same Activity         Level as before                                                                                                                       Injury or problem  Activity       Cooking/kitchen chores  Initial:      0                 follow up: 8 on 5/27  2.         Walking                                                                      Initial:         4              follow up: 7 on 5/27  3.         Sit to stand                                                                    Initial:        4               follow up: 7 on 5/27      COGNITION: Overall cognitive status: Within functional limits for tasks assessed     SENSATION: Not tested  MUSCLE LENGTH: B piriformis and HS tightness, R QL and lumbar  POSTURE: rounded shoulders, forward head, flexed trunk , and left thoracic R lumbar scoliosis (convexity)  PALPATION: Marked pain R QL, and R gluteals near greater trochanter  LUMBAR ROM:   Active  A/PROM  eval  Flexion WFL  Extension WFL  Right lateral flexion   Left lateral flexion   Right rotation NT  Left rotation NT   (Blank rows = not tested)   LOWER EXTREMITY ROM: WFL for tasks assessed  Active ROM Right eval Left eval  Hip flexion    Hip extension    Hip abduction    Hip adduction    Hip internal rotation 15* 20  Hip external rotation    Knee flexion    Knee extension    Ankle dorsiflexion    Ankle plantarflexion    Ankle inversion    Ankle eversion     (Blank rows = not tested) * cramping  LOWER EXTREMITY MMT: tested in sitting  MMT Right eval Left eval  Hip flexion 4 4  Hip extension    Hip abduction 4 4+  Hip adduction 5 5  Hip internal rotation    Hip external rotation    Knee flexion    Knee extension 4+ 4+  Ankle dorsiflexion 4+ 4+  Ankle plantarflexion    Ankle inversion    Ankle eversion     (Blank rows = not tested)   FUNCTIONAL TESTS:  5 times sit to stand: 25.08 sec  04/20/24  13.35 sec GAIT: Distance walked: 20 Assistive device utilized: None Level of assistance: Complete Independence Comments: decreased heel strike, flexed knees and hips, short step length   [facility] Adult PT Treatment:  DATE: 06/01/2024 RECERT for balance  Therapeutic Exercise: Seated hip flex 3# wts on knees x 10 ea Seated clam green TB x 20 Standing heel & toe raises Neuromuscular re-ed: Heel taps 4 inch step x 10 ea Lateral step ups 4 inch step 4 inch up and over R x 10, then 6 inch up and over  Therapeutic Activity: Right ankle  assessed after discussion of fall and status 4 inch up and over R x 10, then 6 inch up and over x 10 Fwd step throughs in upside down 6 inch step Gait with SPC x 1 lap and then throughout visit during change of location Gait over obstacles   [facility] Adult PT Treatment:                                                DATE: 05/18/2024 Therapeutic Exercise: Seated hip flex 3# wts on knees x 10 ea Seated clam green TB x 20 Standing heel & toe raises Seated modified russian twist 3# wt x 10 mild pain in R low back so stopped Neuromuscular re-ed: Heel taps 4 inch step x 10 ea Lateral step ups 4 inch step 4 inch up and over R x 10, then 6 inch up and over  Therapeutic Activity: Right ankle assessed after discussion of fall and status 4 inch up and over R x 10, then 6 inch up and over x 10 Fwd step throughs in upside down 6 inch step Gait with SPC x 1 lap and then throughout visit during change of location      [facility] Adult PT Treatment:                                                DATE: 05/14/2024 Therapeutic Exercise: LTR Figure 4 LTR Standing heel & toe raises Neuromuscular re-ed: Toe walking  Heel walking Tandem walking Tandem balance SLB Therapeutic Activity: Berg balance Walking + stepping over low obstacles   [facility] Adult PT Treatment:                                                DATE: 05/11/2024 Self Care: Discussed methods to help with gait freezing; use of rollator and use of brakes on declines MFR with firm ball to R lumbar Therapeutic Exercise: Counter: Gastroc stretch Toe raises Heel raises + 4 ball b/w ankles Heel walking and toe walking LTR causes spasm today so held Figure 4 LTR --> L direction only SKTC Core marching - back feels like it's going to spasm R S/L clam --> discontinued d/t R back cramping Seated green PB rollout stretch --> front & L diagonal Therapeutic Activity: Checked goals PSFS Split stance squats using 2 (3 min egg foams) as a target  for knee 6 step --> step up/down x 5 B  [facility] Adult PT Treatment:  DATE: 05/05/2024 Therapeutic Exercise: Counter: Gastroc stretch Toe raises Heel raises + 4 ball b/w ankles LTR x 1 min Figure 4 LTR --> L direction only SKTC Core marching Bridges --> discontinued d/t R back cramping Seated green PB rollout stretch --> front & L diagonal Therapeutic Activity: Kneeling on airex, UE on table --> kneeling to standing transfers --> progressed to 1 hand on table, 1 hand braced on thigh Split stance squats 6 step --> step up/down x 1 min    [facility] Adult PT Treatment:                                                DATE: 05/03/2024 Therapeutic Exercise: Standing gastroc stretch x20 bilateral Counter: Heel raises + 4 ball b/w ankles x20 Resisted hip abd + YTB 2x10 Resisted hip ext + YTB x10 Hip ext on diagonal (hands braced on wall) x10 Scap squeezes --> bkwd shoulder circles  Seated thoracic ext stretch with deflated coregeous ball Neuromuscular re-ed: Airex: Head turns --> double vision with head turns to Rt Lateral weight shifting --> progressing to low marching Stepping on/off airex --> close SBA  Therapeutic Activity: Marching + same knee taps 2x15' Marching + opp knee taps 2x15' Side step squat + 1#dowel chest press 2x8 Alt walking fwd x4 steps + 2 wide steps x 80' Wall push-ups --> elbows down x10, elbows out x10 STS cradling 4#DB + marching in standing x8   PATIENT EDUCATION:  Education details: Updated HEP  Person educated: Patient Education method: Explanation, Demonstration, and Handouts Education comprehension: verbalized understanding and returned demonstration  HOME EXERCISE PROGRAM: Access Code: [email or web address] URL: [email or web address] Date: 05/14/2024 Prepared by: Sims Duck  Exercises - Standing Lumbar Extension with Counter  - 3 x daily - 7 x weekly - 1 sets - 10 reps - Supine Figure 4  Piriformis Stretch  - 1 x daily - 7 x weekly - 3 sets - 10 reps - Supine Lower Trunk Rotation  - 1 x daily - 7 x weekly - 3 sets - 10 reps - QL Stretch Supine  - 1 x daily - 7 x weekly - 3 sets - 10 reps - Seated Isometric Hip Abduction with Belt  - 1 x daily - 7 x weekly - 1-2 sets - 10 reps - 5 sec hold - Wall Push Up  - 1 x daily - 3 x weekly - 2 sets - 10 reps - Tandem Walking with Counter Support  - 1 x daily - 7 x weekly - 3 sets - 10 reps - Heel Walking with Counter Support  - 1 x daily - 7 x weekly - 3 sets - 10 reps  ASSESSMENT:  CLINICAL IMPRESSION:  ***  EVAL: Patient is a 83 y.o. female who was seen today for physical therapy evaluation and treatment for muscles spasms secondary to her scoliosis and for balance deficits secondary to PD. She also reports new onset of R hip pain 5 days ago. The back spasms and hip pain are limiting her ability to walk, perform sit to stand, and perform standing kitchen and bathroom chores. She denies any falls in the past 6 months, but reports that her balance is not good. She uses a walker intermittently, mainly at the EOD and if she gets up during the night. She has deficits in flexibility and strength as well as posture  and balance. She will benefit from skilled PT to address these deficits.  She experienced back spasm when attempting to do a supine hip stretch, so initial trial of DN performed on R QL with good twitch response. Patient reported decreased pain at end of session and was able to move without spasm.  OBJECTIVE IMPAIRMENTS: Abnormal gait, decreased activity tolerance, decreased balance, decreased ROM, decreased strength, increased muscle spasms, impaired flexibility, postural dysfunction, and pain.   ACTIVITY LIMITATIONS: bending, sitting, standing, squatting, sleeping, stairs, transfers, bed mobility, bathing, dressing, hygiene/grooming, and locomotion level  PARTICIPATION LIMITATIONS: meal prep, cleaning, laundry, shopping, and  community activity  PERSONAL FACTORS: Age, Fitness, Time since onset of injury/illness/exacerbation, and 3+ comorbidities: OP, PD, scoliosis, HTN are also affecting patient's functional outcome.   REHAB POTENTIAL: Good  CLINICAL DECISION MAKING: Unstable/unpredictable  EVALUATION COMPLEXITY: High   GOALS: Goals reviewed with patient? Yes  SHORT TERM GOALS: Target date: 04/28/24 Decreased back spasms by >= 50% to improve QOL Baseline: Goal status: IN PROGRESS 25% 5/6  2.  Decreased hip pain by 50% with steps, transfers and walking. Baseline:  Goal status: MET  3.  Balance assessment completed at second visit and goals updated. Baseline:  Goal status: MET   LONG TERM GOALS: Target date: 05/26/24  Patient able to perform ADLS without back spasms. Baseline:  Goal status: INITIAL  2.  Pt able to walk and climb stairs without R hip pain. Baseline:  Goal status: IN PROGRESS 75%  better 5/27  3.  Improved 5XSTS by 5 seconds demonstrating improved strength and decreasing risk for falls. Baseline: 25.08 seconds. Goal status: MET 04/20/24 13.35 sec  4.  Improved BERG score by 8 points showing functional improvement Baseline: 43/56 05/14/24: 48/56 Goal status: IN PROCESS  5.  Improve PSFS by 2-3 points  Baseline: see objective Goal status: MET 05/11/24    PLAN:  PT FREQUENCY: 2x/week  PT DURATION: 8 weeks  PLANNED INTERVENTIONS: 97164- PT Re-evaluation, 97110-Therapeutic exercises, 97530- Therapeutic activity, 97112- Neuromuscular re-education, 97535- Self Care, 16109- Manual therapy, [phone]- Gait training, [phone]- Electrical stimulation (unattended), [phone]- Ionotophoresis 4mg /ml Dexamethasone , Patient/Family education, Balance training, Stair training, Taping, Dry Needling, Joint mobilization, Cryotherapy, and Moist heat  PLAN FOR NEXT SESSION: Continue to Work on floor to stand transfers, UE strength to assist with this. Work on balance. Progress hip stretching, glute med  strengthening, LE strength, gait and balance. PRECAUTION: OSTEOPOROSIS.    Jinx Mourning, PT  05/31/2024, 6:19 PM

## 2024-06-01 ENCOUNTER — Ambulatory Visit: Payer: Self-pay | Admitting: Physical Therapy

## 2024-06-01 DIAGNOSIS — R2681 Unsteadiness on feet: Secondary | ICD-10-CM

## 2024-06-01 DIAGNOSIS — M25551 Pain in right hip: Secondary | ICD-10-CM

## 2024-06-01 DIAGNOSIS — R252 Cramp and spasm: Secondary | ICD-10-CM

## 2024-06-01 DIAGNOSIS — M5459 Other low back pain: Secondary | ICD-10-CM

## 2024-06-01 DIAGNOSIS — R2689 Other abnormalities of gait and mobility: Secondary | ICD-10-CM

## 2024-06-03 ENCOUNTER — Ambulatory Visit: Payer: Self-pay

## 2024-06-03 DIAGNOSIS — M5459 Other low back pain: Secondary | ICD-10-CM

## 2024-06-03 DIAGNOSIS — R2689 Other abnormalities of gait and mobility: Secondary | ICD-10-CM

## 2024-06-03 DIAGNOSIS — M25551 Pain in right hip: Secondary | ICD-10-CM

## 2024-06-03 DIAGNOSIS — R252 Cramp and spasm: Secondary | ICD-10-CM

## 2024-06-03 DIAGNOSIS — M6281 Muscle weakness (generalized): Secondary | ICD-10-CM

## 2024-06-03 DIAGNOSIS — R2681 Unsteadiness on feet: Secondary | ICD-10-CM

## 2024-06-03 DIAGNOSIS — R29818 Other symptoms and signs involving the nervous system: Secondary | ICD-10-CM

## 2024-06-03 NOTE — Therapy (Signed)
 OUTPATIENT PHYSICAL THERAPY LUMBAR AND LE TREATMENT   Patient Name: Chelsey Estes MRN: [id number] DOB:Jan 31, 1941, 83 y.o., female Today's Date: 06/03/2024  END OF SESSION:  PT End of Session - 06/03/24 1018     Visit Number 14    Date for PT Re-Evaluation 07/27/24    Authorization Type MCR    Progress Note Due on Visit 23    PT Start Time 1018    PT Stop Time 1103    PT Time Calculation (min) 45 min    Activity Tolerance Patient tolerated treatment well    Behavior During Therapy WFL for tasks assessed/performed         Past Medical History:  Diagnosis Date   Anxiety    Arthritis    Depression    Glaucoma    History of kidney stones    7 lithrotripsy   Hyperlipidemia    Hypertension    Parkinson disease (HCC)    Past Surgical History:  Procedure Laterality Date   ABDOMINAL HYSTERECTOMY     ANTERIOR (CYSTOCELE) AND POSTERIOR REPAIR (RECTOCELE) WITH XENFORM GRAFT AND SACROSPINOUS FIXATION     CATARACT EXTRACTION, BILATERAL     INGUINAL HERNIA REPAIR Left    kidney stones     LUMBAR LAMINECTOMY/DECOMPRESSION MICRODISCECTOMY Right 10/12/2021   Procedure: Right Lumbar four-five Laminectomy for facet/synovial cyst;  Surgeon: Isadora Mar, MD;  Location: [facility] OR;  Service: Neurosurgery;  Laterality: Right;   Patient Active Problem List   Diagnosis Date Noted   Dysuria 02/25/2024   Leukocytes in urine 02/25/2024   Pruritus 10/17/2023   Heart palpitations 07/14/2023   History of hypokalemia 07/14/2023   Vitamin D  deficiency 12/02/2022   Kidney stone 11/22/2022   Tinnitus of left ear 07/03/2022   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 07/03/2022   Decreased GFR 04/17/2022   Medication management 04/16/2022   Synovial cyst of lumbar facet joint 09/19/2021   DDD (degenerative disc disease), lumbar 04/11/2021   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 03/09/2021   Palpitations 09/05/2020   SOB (shortness of  breath) 09/05/2020   Chest pain 09/05/2020   Mixed hyperlipidemia 06/06/2020   Primary insomnia 06/06/2020   Primary parkinsonism (HCC) 10/26/2019   Osteoporosis 09/15/2019   Blister of great toe of left foot 08/19/2019   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   Moderate episode of recurrent major depressive disorder (HCC) 02/09/2019   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 02/09/2019   Shuffling gait 02/09/2019   Balance problems 02/09/2019   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   Orthostatic hypotension 02/01/2019   Dizziness 02/01/2019   Primary osteoarthritis of right foot 09/25/2018   Snoring 09/25/2018   Night terrors, adult 09/25/2018   Multiple falls 09/25/2018   Metatarsalgia of left foot 08/20/2018   Chronic foot pain, right 08/20/2018   Left hip pain 04/10/2017   History of shingles 04/08/2017   Adenomatous polyp 04/08/2017   Pure hypercholesterolemia 04/08/2017   History of kidney stones 04/08/2017   Essential hypertension 04/08/2017   Depression, recurrent (HCC) 04/08/2017   Anxiety 04/08/2017   Greater trochanteric bursitis of left hip 04/08/2017    PCP: Araceli Knight, PA-C   REFERRING PROVIDER: Shirline Dover, DO   REFERRING DIAG:  G20.A1 (ICD-10-CM) - Parkinson's disease without dyskinesia or fluctuating manifestations (HCC)  M41.9 (ICD-10-CM) - Scoliosis, unspecified scoliosis type, unspecified spinal region    THERAPY DIAG:  Unsteadiness on feet  Other abnormalities of gait and mobility  Other  low back pain  Pain in right hip  Cramp and spasm  Muscle weakness (generalized)  Other symptoms and signs involving the nervous system  Balance disorder  Rationale for Evaluation and Treatment: Rehabilitation  ONSET DATE: one year  SUBJECTIVE:   SUBJECTIVE STATEMENT:  Patient reports she is looking into adding grab bars in master bathroom to help with standing up. Patient states her balance varies when stepping up and over doorway into the  house.    PERTINENT HISTORY: Osteoporosis (cannot take meds), PD, R L 4/5 laminectomy, HTN, scoliosis, h/o falls PAIN:  Are you having pain? Yes: NPRS scale: 0 today up to 8/10 Pain location: R lumbar Pain description: stabbing spasm Aggravating factors: loading dishwasher, standing in bathroom Relieving factors: sitting  Are you having pain? Yes: NPRS scale: 0/10 Pain location: R thigh pain Pain description: soreness Aggravating factors: sitting over 2 hours Relieving factors: walking   PRECAUTIONS: Fall and Other: OSTEOPOROSIS  RED FLAGS: None   WEIGHT BEARING RESTRICTIONS: No  FALLS:  Has patient fallen in last 6 months? 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  DIAGNOSTIC FINDINGS: none  PATIENT SURVEYS:  The Patient-Specific Functional Scale  Initial:  I am going to ask you to identify up to 3 important activities that you are unable to do or are having difficulty with as a result of this problem.  Today are there any activities that you are unable to do or having difficulty with because of this?  (Patient shown scale and patient rated each activity)  Follow up: When you first came in you had difficulty performing these activities.  Today do you still have difficulty?  Patient-Specific activity scoring scheme (Point to one number):  0 1 2 3 4 5 6 7 8 9  10 Unable                                                                                                          Able to perform To perform                                                                                                     activity at the same Activity         Level as before  Injury or problem  Activity       Cooking/kitchen chores                                                                          Initial:      0                 follow up: 8 on 5/27  2.         Walking                                                                      Initial:         4              follow up: 7 on 5/27  3.         Sit to stand                                                                    Initial:        4               follow up: 7 on 5/27     COGNITION: Overall cognitive status: Within functional limits for tasks assessed     SENSATION: Not tested  MUSCLE LENGTH: B piriformis and HS tightness, R QL and lumbar  POSTURE: rounded shoulders, forward head, flexed trunk , and left thoracic R lumbar scoliosis (convexity)  PALPATION: Marked pain R QL, and R gluteals near greater trochanter  LUMBAR ROM:   Active  A/PROM  eval  Flexion WFL  Extension WFL  Right lateral flexion   Left lateral flexion   Right rotation NT  Left rotation NT   (Blank rows = not tested)   LOWER EXTREMITY ROM: WFL for tasks assessed  Active ROM Right eval Left eval  Hip flexion    Hip extension    Hip abduction    Hip adduction    Hip internal rotation 15* 20  Hip external rotation    Knee flexion    Knee extension    Ankle dorsiflexion    Ankle plantarflexion    Ankle inversion    Ankle eversion     (Blank rows = not tested) * cramping  LOWER EXTREMITY MMT: tested in sitting  MMT Right eval Left eval Right 6/17 Left  6/17  Hip flexion 4 4 4 4   Hip extension      Hip abduction 4 4+ 5 in sitting 5  in sitting  Hip adduction 5 5    Hip internal rotation      Hip external rotation      Knee flexion   5 4+  Knee extension 4+ 4+ 4+ 4  Ankle dorsiflexion 4+ 4+  5 5  Ankle plantarflexion      Ankle inversion      Ankle eversion       (Blank rows = not tested)   FUNCTIONAL TESTS:  5 times sit to stand: 25.08 sec  04/20/24  13.35 sec   [facility] Adult PT Treatment:                                                DATE: 06/03/2024 Therapeutic Exercise: Great toe extension --> toe yoga  Toe extension/flexion over edge of box STS straddling corner of table + overhead arm raises & head turns Therapeutic Activity: Resisted lateral step out/in + RTB crossed at ankles Resisted fwd/bkwd stepping + YTB crossed at ankles Step over yoga block up onto 4 box Lateral step up and over 4 box    06/01/24: 5XSTS 14.85 sec (goal for age 54.3 sed) TUG 15 sec with SPC  (goal for age  8.3 sec) BERG 47/56   GAIT: Distance walked: 20 Assistive device utilized: None Level of assistance: Complete Independence Comments: decreased heel strike, flexed knees and hips, short step length   [facility] Adult PT Treatment:                                                DATE: 06/01/2024   Therapeutic Activity: RECERT completed Worked on stepping through her doorway simulation: up and overs sitting and standing; also discussed ways to modify process including fixing tensioner on door or using something to block the door from closing.   [facility] Adult PT Treatment:                                                DATE: 05/18/2024 Therapeutic Exercise: Seated hip flex 3# wts on knees x 10 ea Seated clam green TB x 20 Standing heel & toe raises Seated modified russian twist 3# wt x 10 mild pain in R low back so stopped Neuromuscular re-ed: Heel taps 4 inch step x 10 ea Lateral step ups 4 inch step 4 inch up and over R x 10, then 6 inch up and over  Therapeutic Activity: Right ankle assessed after discussion of fall and status 4 inch up and over R x 10, then 6 inch up and over x 10 Fwd step throughs in upside down 6 inch step Gait with SPC x 1 lap and then throughout visit during  change of location    PATIENT EDUCATION:  Education details: Updated HEP  Person educated: Patient Education method: Explanation, Demonstration, and Handouts Education comprehension: verbalized understanding and returned demonstration  HOME EXERCISE PROGRAM: Access Code: [email or web address] URL: [email or web address] Date: 05/14/2024 Prepared by: Sims Duck  Exercises - Standing Lumbar Extension with Counter  - 3 x daily - 7 x weekly - 1 sets - 10 reps - Supine Figure 4 Piriformis Stretch  - 1 x daily - 7 x weekly - 3 sets - 10 reps - Supine Lower Trunk Rotation  - 1 x daily - 7 x weekly - 3 sets - 10 reps - QL Stretch Supine  -  1 x daily - 7 x weekly - 3 sets - 10 reps - Seated Isometric Hip Abduction with Belt  - 1 x daily - 7 x weekly - 1-2 sets - 10 reps - 5 sec hold - Wall Push Up  - 1 x daily - 3 x weekly - 2 sets - 10 reps - Tandem Walking with Counter Support  - 1 x daily - 7 x weekly - 3 sets - 10 reps - Heel Walking with Counter Support  - 1 x daily - 7 x weekly - 3 sets - 10 reps  ASSESSMENT:  CLINICAL IMPRESSION:  Stepping to/from various surface heights incorporated to simulate transfers into house; stepping obstacle added to challenge safe foot clearance. Functional LE strengthening progressed, as well as intrinsic foot strengthening/mobility exercises to address foot alignment issues affecting balance.   EVAL: Patient is a 83 y.o. female who was seen today for physical therapy evaluation and treatment for muscles spasms secondary to her scoliosis and for balance deficits secondary to PD. She also reports new onset of R hip pain 5 days ago. The back spasms and hip pain are limiting her ability to walk, perform sit to stand, and perform standing kitchen and bathroom chores. She denies any falls in the past 6 months, but reports that her balance is not good. She uses a walker intermittently, mainly at the EOD and if she gets up during the night. She has deficits in  flexibility and strength as well as posture and balance. She will benefit from skilled PT to address these deficits.  She experienced back spasm when attempting to do a supine hip stretch, so initial trial of DN performed on R QL with good twitch response. Patient reported decreased pain at end of session and was able to move without spasm.  OBJECTIVE IMPAIRMENTS: Abnormal gait, decreased activity tolerance, decreased balance, decreased ROM, decreased strength, increased muscle spasms, impaired flexibility, postural dysfunction, and pain.   ACTIVITY LIMITATIONS: bending, sitting, standing, squatting, sleeping, stairs, transfers, bed mobility, bathing, dressing, hygiene/grooming, and locomotion level  PARTICIPATION LIMITATIONS: meal prep, cleaning, laundry, shopping, and community activity  PERSONAL FACTORS: Age, Fitness, Time since onset of injury/illness/exacerbation, and 3+ comorbidities: OP, PD, scoliosis, HTN are also affecting patient's functional outcome.   REHAB POTENTIAL: Good  CLINICAL DECISION MAKING: Unstable/unpredictable  EVALUATION COMPLEXITY: High   GOALS: Goals reviewed with patient? Yes  SHORT TERM GOALS: Target date: 04/28/24 Decreased back spasms by >= 50% to improve QOL Baseline: Goal status: IN PROGRESS 25% 5/6  2.  Decreased hip pain by 50% with steps, transfers and walking. Baseline:  Goal status: MET  3.  Balance assessment completed at second visit and goals updated. Baseline:  Goal status: MET   LONG TERM GOALS: Target date: 07/27/24  Patient able to perform ADLS without back spasms. Baseline:  Goal status: MET 06/01/24  2.  Pt able to walk and climb stairs without R hip pain. Baseline:  Goal status: IN PROGRESS 75%  better 5/27  3.  Improved 5XSTS by 5 seconds demonstrating improved strength and decreasing risk for falls. Baseline: 25.08 seconds. Goal status: MET 04/20/24 13.35 sec  4.  Improved BERG score by 8 points showing functional  improvement Baseline: 43/56 05/14/24: 48/56 06/01/24  47/56 Goal status: IN PROCESS  5.  Improve PSFS by 2-3 points  Baseline: see objective Goal status: MET 05/11/24  6.  Patient to demonstrate improved balance and hip strength by being able to enter in/out of her front and back  doors safely without LOB. Baseline: Goal status: NEW  7. Patient able to walk in her bathroom and perform toilet transfers with no LOB.  Baseline:  Goal status: NEW  8.  Patient able to clear 6 inch obstacles forward and laterally using her SPC or one UE support demonstrating improved SLS time and balance.  Baseline:  Goal status: NEW   9.  Decreased TUG score to 11.3 sec with SPC demonstrating decreased risk for falls.  Baseline:  Goal status: NEW    PLAN:  PT FREQUENCY: 2x/week  PT DURATION: 8 weeks  PLANNED INTERVENTIONS: 16109- PT Re-evaluation, 97110-Therapeutic exercises, 97530- Therapeutic activity, 97112- Neuromuscular re-education, 97535- Self Care, 60454- Manual therapy, [phone]- Gait training, [phone]- Electrical stimulation (unattended), [phone]- Ionotophoresis 4mg /ml Dexamethasone , Patient/Family education, Balance training, Stair training, Taping, Dry Needling, Joint mobilization, Cryotherapy, and Moist heat  PLAN FOR NEXT SESSION: Focus on standing up and overs to strengthen hips for clearing entryway at home, SLS activities to increase stance time Bil, Side stepping and low sit to stand transfers for help with commode transfers. Continue to Work on floor to stand transfers, UE strength to assist with this. general balance.  PRECAUTION: OSTEOPOROSIS.    Sims Duck, PTA 06/03/2024, 11:15 AM

## 2024-06-07 ENCOUNTER — Ambulatory Visit: Admitting: Rehabilitative and Restorative Service Providers"

## 2024-06-14 ENCOUNTER — Ambulatory Visit

## 2024-06-14 DIAGNOSIS — M5459 Other low back pain: Secondary | ICD-10-CM

## 2024-06-14 DIAGNOSIS — R252 Cramp and spasm: Secondary | ICD-10-CM | POA: Diagnosis not present

## 2024-06-14 DIAGNOSIS — M25551 Pain in right hip: Secondary | ICD-10-CM

## 2024-06-14 DIAGNOSIS — R29818 Other symptoms and signs involving the nervous system: Secondary | ICD-10-CM

## 2024-06-14 DIAGNOSIS — R2681 Unsteadiness on feet: Secondary | ICD-10-CM

## 2024-06-14 DIAGNOSIS — M6281 Muscle weakness (generalized): Secondary | ICD-10-CM

## 2024-06-14 DIAGNOSIS — R2689 Other abnormalities of gait and mobility: Secondary | ICD-10-CM

## 2024-06-14 NOTE — Therapy (Signed)
 OUTPATIENT PHYSICAL THERAPY LUMBAR AND LE TREATMENT   Patient Name: Chelsey Estes MRN: [id number] DOB:1941-01-01, 83 y.o., female Today's Date: 06/14/2024  END OF SESSION:  PT End of Session - 06/14/24 1320     Visit Number 15    Date for PT Re-Evaluation 07/27/24    Authorization Type MCR    Progress Note Due on Visit 23    PT Start Time 1320    PT Stop Time 1407    PT Time Calculation (min) 47 min    Activity Tolerance Patient tolerated treatment well    Behavior During Therapy WFL for tasks assessed/performed         Past Medical History:  Diagnosis Date   Anxiety    Arthritis    Depression    Glaucoma    History of kidney stones    7 lithrotripsy   Hyperlipidemia    Hypertension    Parkinson disease (HCC)    Past Surgical History:  Procedure Laterality Date   ABDOMINAL HYSTERECTOMY     ANTERIOR (CYSTOCELE) AND POSTERIOR REPAIR (RECTOCELE) WITH XENFORM GRAFT AND SACROSPINOUS FIXATION     CATARACT EXTRACTION, BILATERAL     INGUINAL HERNIA REPAIR Left    kidney stones     LUMBAR LAMINECTOMY/DECOMPRESSION MICRODISCECTOMY Right 10/12/2021   Procedure: Right Lumbar four-five Laminectomy for facet/synovial cyst;  Surgeon: Joshua Alm RAMAN, MD;  Location: [facility] OR;  Service: Neurosurgery;  Laterality: Right;   Patient Active Problem List   Diagnosis Date Noted   Dysuria 02/25/2024   Leukocytes in urine 02/25/2024   Pruritus 10/17/2023   Heart palpitations 07/14/2023   History of hypokalemia 07/14/2023   Vitamin D  deficiency 12/02/2022   Kidney stone 11/22/2022   Tinnitus of left ear 07/03/2022   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 07/03/2022   Decreased GFR 04/17/2022   Medication management 04/16/2022   Synovial cyst of lumbar facet joint 09/19/2021   DDD (degenerative disc disease), lumbar 04/11/2021   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 03/09/2021   Palpitations 09/05/2020   SOB (shortness of  breath) 09/05/2020   Chest pain 09/05/2020   Mixed hyperlipidemia 06/06/2020   Primary insomnia 06/06/2020   Primary parkinsonism (HCC) 10/26/2019   Osteoporosis 09/15/2019   Blister of great toe of left foot 08/19/2019   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   Moderate episode of recurrent major depressive disorder (HCC) 02/09/2019   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 02/09/2019   Shuffling gait 02/09/2019   Balance problems 02/09/2019   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   Orthostatic hypotension 02/01/2019   Dizziness 02/01/2019   Primary osteoarthritis of right foot 09/25/2018   Snoring 09/25/2018   Night terrors, adult 09/25/2018   Multiple falls 09/25/2018   Metatarsalgia of left foot 08/20/2018   Chronic foot pain, right 08/20/2018   Left hip pain 04/10/2017   History of shingles 04/08/2017   Adenomatous polyp 04/08/2017   Pure hypercholesterolemia 04/08/2017   History of kidney stones 04/08/2017   Essential hypertension 04/08/2017   Depression, recurrent (HCC) 04/08/2017   Anxiety 04/08/2017   Greater trochanteric bursitis of left hip 04/08/2017    PCP: Antoniette Vermell CROME, PA-C   REFERRING PROVIDER: Evonnie Asberry RAMAN, DO   REFERRING DIAG:  G20.A1 (ICD-10-CM) - Parkinson's disease without dyskinesia or fluctuating manifestations (HCC)  M41.9 (ICD-10-CM) - Scoliosis, unspecified scoliosis type, unspecified spinal region    THERAPY DIAG:  Unsteadiness on feet  Other abnormalities of gait and mobility  Other  low back pain  Pain in right hip  Cramp and spasm  Muscle weakness (generalized)  Other symptoms and signs involving the nervous system  Balance disorder  Rationale for Evaluation and Treatment: Rehabilitation  ONSET DATE: one year  SUBJECTIVE:   SUBJECTIVE STATEMENT:  Patient reports she brought pictures of her entryway step inside her house; states she caught her foot on the ridge going in to the house this morning.     PERTINENT  HISTORY: Osteoporosis (cannot take meds), PD, R L 4/5 laminectomy, HTN, scoliosis, h/o falls PAIN:  Are you having pain? Yes: NPRS scale: 0 today up to 8/10 Pain location: R lumbar Pain description: stabbing spasm Aggravating factors: loading dishwasher, standing in bathroom Relieving factors: sitting  Are you having pain? Yes: NPRS scale: 0/10 Pain location: R thigh pain Pain description: soreness Aggravating factors: sitting over 2 hours Relieving factors: walking   PRECAUTIONS: Fall and Other: OSTEOPOROSIS  RED FLAGS: None   WEIGHT BEARING RESTRICTIONS: No  FALLS:  Has patient fallen in last 6 months? 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  DIAGNOSTIC FINDINGS: none  PATIENT SURVEYS:  The Patient-Specific Functional Scale  Initial:  I am going to ask you to identify up to 3 important activities that you are unable to do or are having difficulty with as a result of this problem.  Today are there any activities that you are unable to do or having difficulty with because of this?  (Patient shown scale and patient rated each activity)  Follow up: When you first came in you had difficulty performing these activities.  Today do you still have difficulty?  Patient-Specific activity scoring scheme (Point to one number):  0 1 2 3 4 5 6 7 8 9  10 Unable                                                                                                          Able to perform To perform                                                                                                     activity at the same Activity         Level as before  Injury or problem  Activity       Cooking/kitchen chores                                                                          Initial:      0                 follow up: 8 on 5/27  2.         Walking                                                                      Initial:         4              follow up: 7 on 5/27  3.         Sit to stand                                                                    Initial:        4               follow up: 7 on 5/27     COGNITION: Overall cognitive status: Within functional limits for tasks assessed     SENSATION: Not tested  MUSCLE LENGTH: B piriformis and HS tightness, R QL and lumbar  POSTURE: rounded shoulders, forward head, flexed trunk , and left thoracic R lumbar scoliosis (convexity)  PALPATION: Marked pain R QL, and R gluteals near greater trochanter  LUMBAR ROM:   Active  A/PROM  eval  Flexion WFL  Extension WFL  Right lateral flexion   Left lateral flexion   Right rotation NT  Left rotation NT   (Blank rows = not tested)   LOWER EXTREMITY ROM: WFL for tasks assessed  Active ROM Right eval Left eval  Hip flexion    Hip extension    Hip abduction    Hip adduction    Hip internal rotation 15* 20  Hip external rotation    Knee flexion    Knee extension    Ankle dorsiflexion    Ankle plantarflexion    Ankle inversion    Ankle eversion     (Blank rows = not tested) * cramping  LOWER EXTREMITY MMT: tested in sitting  MMT Right eval Left eval Right 6/17 Left  6/17  Hip flexion 4 4 4 4   Hip extension      Hip abduction 4 4+ 5 in sitting 5  in sitting  Hip adduction 5 5    Hip internal rotation      Hip external rotation      Knee flexion   5 4+  Knee extension 4+ 4+ 4+ 4  Ankle dorsiflexion 4+ 4+ 5 5  Ankle  plantarflexion      Ankle inversion      Ankle eversion       (Blank rows = not tested)   FUNCTIONAL TESTS:  5 times sit to stand: 25.08 sec  04/20/24  13.35 sec   [facility] Adult PT Treatment:                                                DATE: 06/14/2024 Therapeutic Exercise: LTR --> flared up R low back S/L hip abd --> position put oo much pressure on bottom hip Seated leg rises over yoga block Neuromuscular re-ed: Counter: Tandem walking Heel walking [place] side stepping over bands --> step behind  Therapeutic Activity: 6 step up/down 6 lateral step up   [facility] Adult PT Treatment:                                                DATE: 06/03/2024 Therapeutic Exercise: Great toe extension --> toe yoga  Toe extension/flexion over edge of box STS straddling corner of table + overhead arm raises & head turns Therapeutic Activity: Resisted lateral step out/in + RTB crossed at ankles Resisted fwd/bkwd stepping + YTB crossed at ankles Step over yoga block up onto 4 box Lateral step up and over 4 box    06/01/24: 5XSTS 14.85 sec (goal for age 100.3 sed) TUG 15 sec with SPC  (goal for age  35.3 sec) BERG 47/56   GAIT: Distance walked: 20 Assistive device utilized: None Level of assistance: Complete Independence Comments: decreased heel strike, flexed knees and hips, short step length   [facility] Adult PT Treatment:                                                DATE: 06/01/2024   Therapeutic Activity: RECERT completed Worked on stepping through her doorway simulation: up and overs sitting and standing; also discussed ways to modify process including fixing tensioner on door or using something to block the door from closing.   [facility] Adult PT Treatment:                                                DATE: 05/18/2024 Therapeutic Exercise: Seated hip flex 3# wts on knees x 10 ea Seated clam green TB x 20 Standing heel & toe raises Seated modified russian twist 3# wt x 10 mild  pain in R low back so stopped Neuromuscular re-ed: Heel taps 4 inch step x 10 ea Lateral step ups 4 inch step 4 inch up and over R x 10, then 6 inch up and over  Therapeutic Activity: Right ankle assessed after discussion of fall and status 4 inch up and over R x 10, then 6 inch up and over x 10 Fwd step throughs in upside down 6 inch step Gait with SPC x 1 lap and then throughout visit during change of location  PATIENT EDUCATION:  Education details: Updated HEP  Person educated: Patient Education method: Explanation, Demonstration, and Handouts Education comprehension: verbalized understanding and returned demonstration  HOME EXERCISE PROGRAM: Access Code: [email or web address] URL: [email or web address] Date: 06/14/2024 Prepared by: Lamarr Price  Exercises - Standing Lumbar Extension with Counter  - 3 x daily - 7 x weekly - 1 sets - 10 reps - Supine Figure 4 Piriformis Stretch  - 1 x daily - 7 x weekly - 3 sets - 10 reps - Supine Lower Trunk Rotation  - 1 x daily - 7 x weekly - 3 sets - 10 reps - QL Stretch Supine  - 1 x daily - 7 x weekly - 3 sets - 10 reps - Seated Isometric Hip Abduction with Belt  - 1 x daily - 7 x weekly - 1-2 sets - 10 reps - 5 sec hold - Wall Push Up  - 1 x daily - 3 x weekly - 2 sets - 10 reps - Tandem Walking with Counter Support  - 1 x daily - 7 x weekly - 3 sets - 10 reps - Heel Walking with Counter Support  - 1 x daily - 7 x weekly - 3 sets - 10 reps - Seated Hip Abduction with Resistance  - 1 x daily - 7 x weekly - 3 sets - 10 reps  ASSESSMENT:  CLINICAL IMPRESSION:  Functional strengthening continued, focusing on step up variations. Dynamic balance challenge with tandem walking and progression to braided walking; patient required UE support with narrow base of support activities. Increased spasm at Rt Qls/lumbar paraspinals with lateral trunk rotation; exercise discontinued and recommended patient return to QL stretch from HEP.  EVAL:  Patient is a 83 y.o. female who was seen today for physical therapy evaluation and treatment for muscles spasms secondary to her scoliosis and for balance deficits secondary to PD. She also reports new onset of R hip pain 5 days ago. The back spasms and hip pain are limiting her ability to walk, perform sit to stand, and perform standing kitchen and bathroom chores. She denies any falls in the past 6 months, but reports that her balance is not good. She uses a walker intermittently, mainly at the EOD and if she gets up during the night. She has deficits in flexibility and strength as well as posture and balance. She will benefit from skilled PT to address these deficits.  She experienced back spasm when attempting to do a supine hip stretch, so initial trial of DN performed on R QL with good twitch response. Patient reported decreased pain at end of session and was able to move without spasm.  OBJECTIVE IMPAIRMENTS: Abnormal gait, decreased activity tolerance, decreased balance, decreased ROM, decreased strength, increased muscle spasms, impaired flexibility, postural dysfunction, and pain.   ACTIVITY LIMITATIONS: bending, sitting, standing, squatting, sleeping, stairs, transfers, bed mobility, bathing, dressing, hygiene/grooming, and locomotion level  PARTICIPATION LIMITATIONS: meal prep, cleaning, laundry, shopping, and community activity  PERSONAL FACTORS: Age, Fitness, Time since onset of injury/illness/exacerbation, and 3+ comorbidities: OP, PD, scoliosis, HTN are also affecting patient's functional outcome.   REHAB POTENTIAL: Good  CLINICAL DECISION MAKING: Unstable/unpredictable  EVALUATION COMPLEXITY: High   GOALS: Goals reviewed with patient? Yes  SHORT TERM GOALS: Target date: 04/28/24 Decreased back spasms by >= 50% to improve QOL Baseline: Goal status: IN PROGRESS 25% 5/6  2.  Decreased hip pain by 50% with steps, transfers and walking. Baseline:  Goal status: MET  3.   Balance assessment completed at second  visit and goals updated. Baseline:  Goal status: MET   LONG TERM GOALS: Target date: 07/27/24  Patient able to perform ADLS without back spasms. Baseline:  Goal status: MET 06/01/24  2.  Pt able to walk and climb stairs without R hip pain. Baseline:  Goal status: IN PROGRESS 75%  better 5/27  3.  Improved 5XSTS by 5 seconds demonstrating improved strength and decreasing risk for falls. Baseline: 25.08 seconds. Goal status: MET 04/20/24 13.35 sec  4.  Improved BERG score by 8 points showing functional improvement Baseline: 43/56 05/14/24: 48/56 06/01/24  47/56 Goal status: IN PROCESS  5.  Improve PSFS by 2-3 points  Baseline: see objective Goal status: MET 05/11/24  6.  Patient to demonstrate improved balance and hip strength by being able to enter in/out of her front and back doors safely without LOB. Baseline: Goal status: NEW  7. Patient able to walk in her bathroom and perform toilet transfers with no LOB.  Baseline:  Goal status: NEW  8.  Patient able to clear 6 inch obstacles forward and laterally using her SPC or one UE support demonstrating improved SLS time and balance.  Baseline:  Goal status: NEW   9.  Decreased TUG score to 11.3 sec with SPC demonstrating decreased risk for falls.  Baseline:  Goal status: NEW    PLAN:  PT FREQUENCY: 2x/week  PT DURATION: 8 weeks  PLANNED INTERVENTIONS: 02835- PT Re-evaluation, 97110-Therapeutic exercises, 97530- Therapeutic activity, 97112- Neuromuscular re-education, 97535- Self Care, 02859- Manual therapy, [phone]- Gait training, [phone]- Electrical stimulation (unattended), [phone]- Ionotophoresis 4mg /ml Dexamethasone , Patient/Family education, Balance training, Stair training, Taping, Dry Needling, Joint mobilization, Cryotherapy, and Moist heat  PLAN FOR NEXT SESSION: Focus on standing up and overs to strengthen hips for clearing entryway at home, SLS activities to increase stance time  Bil, Side stepping and low sit to stand transfers for help with commode transfers. Continue to Work on floor to stand transfers, UE strength to assist with this. general balance.  PRECAUTION: OSTEOPOROSIS.    Lamarr Price, PTA 06/14/2024, 2:29 PM

## 2024-06-16 ENCOUNTER — Ambulatory Visit: Attending: Neurology

## 2024-06-16 DIAGNOSIS — R29818 Other symptoms and signs involving the nervous system: Secondary | ICD-10-CM | POA: Insufficient documentation

## 2024-06-16 DIAGNOSIS — R2681 Unsteadiness on feet: Secondary | ICD-10-CM | POA: Diagnosis present

## 2024-06-16 DIAGNOSIS — M5459 Other low back pain: Secondary | ICD-10-CM | POA: Diagnosis present

## 2024-06-16 DIAGNOSIS — R252 Cramp and spasm: Secondary | ICD-10-CM | POA: Diagnosis present

## 2024-06-16 DIAGNOSIS — M6281 Muscle weakness (generalized): Secondary | ICD-10-CM | POA: Insufficient documentation

## 2024-06-16 DIAGNOSIS — R2689 Other abnormalities of gait and mobility: Secondary | ICD-10-CM | POA: Diagnosis present

## 2024-06-16 DIAGNOSIS — M25551 Pain in right hip: Secondary | ICD-10-CM | POA: Insufficient documentation

## 2024-06-16 NOTE — Therapy (Signed)
 OUTPATIENT PHYSICAL THERAPY LUMBAR AND LE TREATMENT   Patient Name: Chelsey Estes MRN: [id number] DOB:1941-06-08, 83 y.o., female Today's Date: 06/16/2024  END OF SESSION:  PT End of Session - 06/16/24 1105     Visit Number 16    Date for PT Re-Evaluation 07/27/24    Authorization Type MCR    Progress Note Due on Visit 23    PT Start Time 1105    PT Stop Time 1145    PT Time Calculation (min) 40 min    Activity Tolerance Patient tolerated treatment well    Behavior During Therapy WFL for tasks assessed/performed         Past Medical History:  Diagnosis Date   Anxiety    Arthritis    Depression    Glaucoma    History of kidney stones    7 lithrotripsy   Hyperlipidemia    Hypertension    Parkinson disease (HCC)    Past Surgical History:  Procedure Laterality Date   ABDOMINAL HYSTERECTOMY     ANTERIOR (CYSTOCELE) AND POSTERIOR REPAIR (RECTOCELE) WITH XENFORM GRAFT AND SACROSPINOUS FIXATION     CATARACT EXTRACTION, BILATERAL     INGUINAL HERNIA REPAIR Left    kidney stones     LUMBAR LAMINECTOMY/DECOMPRESSION MICRODISCECTOMY Right 10/12/2021   Procedure: Right Lumbar four-five Laminectomy for facet/synovial cyst;  Surgeon: Joshua Alm RAMAN, MD;  Location: [facility] OR;  Service: Neurosurgery;  Laterality: Right;   Patient Active Problem List   Diagnosis Date Noted   Dysuria 02/25/2024   Leukocytes in urine 02/25/2024   Pruritus 10/17/2023   Heart palpitations 07/14/2023   History of hypokalemia 07/14/2023   Vitamin D  deficiency 12/02/2022   Kidney stone 11/22/2022   Tinnitus of left ear 07/03/2022   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 07/03/2022   Decreased GFR 04/17/2022   Medication management 04/16/2022   Synovial cyst of lumbar facet joint 09/19/2021   DDD (degenerative disc disease), lumbar 04/11/2021   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 03/09/2021   Palpitations 09/05/2020   SOB (shortness of  breath) 09/05/2020   Chest pain 09/05/2020   Mixed hyperlipidemia 06/06/2020   Primary insomnia 06/06/2020   Primary parkinsonism (HCC) 10/26/2019   Osteoporosis 09/15/2019   Blister of great toe of left foot 08/19/2019   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   Moderate episode of recurrent major depressive disorder (HCC) 02/09/2019   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 02/09/2019   Shuffling gait 02/09/2019   Balance problems 02/09/2019   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   Orthostatic hypotension 02/01/2019   Dizziness 02/01/2019   Primary osteoarthritis of right foot 09/25/2018   Snoring 09/25/2018   Night terrors, adult 09/25/2018   Multiple falls 09/25/2018   Metatarsalgia of left foot 08/20/2018   Chronic foot pain, right 08/20/2018   Left hip pain 04/10/2017   History of shingles 04/08/2017   Adenomatous polyp 04/08/2017   Pure hypercholesterolemia 04/08/2017   History of kidney stones 04/08/2017   Essential hypertension 04/08/2017   Depression, recurrent (HCC) 04/08/2017   Anxiety 04/08/2017   Greater trochanteric bursitis of left hip 04/08/2017    PCP: Antoniette Vermell CROME, PA-C   REFERRING PROVIDER: Evonnie Asberry RAMAN, DO   REFERRING DIAG:  G20.A1 (ICD-10-CM) - Parkinson's disease without dyskinesia or fluctuating manifestations (HCC)  M41.9 (ICD-10-CM) - Scoliosis, unspecified scoliosis type, unspecified spinal region    THERAPY DIAG:  Other abnormalities of gait and mobility  Unsteadiness on feet  Other  low back pain  Pain in right hip  Cramp and spasm  Muscle weakness (generalized)  Other symptoms and signs involving the nervous system  Balance disorder  Rationale for Evaluation and Treatment: Rehabilitation  ONSET DATE: one year  SUBJECTIVE:   SUBJECTIVE STATEMENT:  Patient reports she is able to do LTR at home on bed with no cramping in back. Patient brought in cane with wide base but states she feels unsteady with it and doesn't like how  heavy it is.   PERTINENT HISTORY: Osteoporosis (cannot take meds), PD, R L 4/5 laminectomy, HTN, scoliosis, h/o falls PAIN:  Are you having pain? Yes: NPRS scale: 0 today up to 8/10 Pain location: R lumbar Pain description: stabbing spasm Aggravating factors: loading dishwasher, standing in bathroom Relieving factors: sitting  Are you having pain? Yes: NPRS scale: 0/10 Pain location: R thigh pain Pain description: soreness Aggravating factors: sitting over 2 hours Relieving factors: walking   PRECAUTIONS: Fall and Other: OSTEOPOROSIS  RED FLAGS: None   WEIGHT BEARING RESTRICTIONS: No  FALLS:  Has patient fallen in last 6 months? 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  DIAGNOSTIC FINDINGS: none  PATIENT SURVEYS:  The Patient-Specific Functional Scale  Initial:  I am going to ask you to identify up to 3 important activities that you are unable to do or are having difficulty with as a result of this problem.  Today are there any activities that you are unable to do or having difficulty with because of this?  (Patient shown scale and patient rated each activity)  Follow up: When you first came in you had difficulty performing these activities.  Today do you still have difficulty?  Patient-Specific activity scoring scheme (Point to one number):  0 1 2 3 4 5 6 7 8 9  10 Unable                                                                                                          Able to perform To perform                                                                                                     activity at the same Activity  Level as before                                                                                                                       Injury or problem  Activity       Cooking/kitchen chores                                                                          Initial:      0                 follow up: 8 on 5/27  2.         Walking                                                                      Initial:         4              follow up: 7 on 5/27  3.         Sit to stand                                                                    Initial:        4               follow up: 7 on 5/27     COGNITION: Overall cognitive status: Within functional limits for tasks assessed     SENSATION: Not tested  MUSCLE LENGTH: B piriformis and HS tightness, R QL and lumbar  POSTURE: rounded shoulders, forward head, flexed trunk , and left thoracic R lumbar scoliosis (convexity)  PALPATION: Marked pain R QL, and R gluteals near greater trochanter  LUMBAR ROM:   Active  A/PROM  eval  Flexion WFL  Extension WFL  Right lateral flexion   Left lateral flexion   Right rotation NT  Left rotation NT   (Blank rows = not tested)   LOWER EXTREMITY ROM: WFL for tasks assessed  Active ROM Right eval Left eval  Hip flexion    Hip extension    Hip abduction    Hip adduction    Hip internal rotation 15* 20  Hip external rotation  Knee flexion    Knee extension    Ankle dorsiflexion    Ankle plantarflexion    Ankle inversion    Ankle eversion     (Blank rows = not tested) * cramping  LOWER EXTREMITY MMT: tested in sitting  MMT Right eval Left eval Right 6/17 Left  6/17  Hip flexion 4 4 4 4   Hip extension      Hip abduction 4 4+ 5 in sitting 5  in sitting  Hip adduction 5 5    Hip internal rotation      Hip external rotation      Knee flexion   5 4+  Knee extension 4+ 4+ 4+ 4  Ankle dorsiflexion  4+ 4+ 5 5  Ankle plantarflexion      Ankle inversion      Ankle eversion       (Blank rows = not tested)   FUNCTIONAL TESTS:  5 times sit to stand: 25.08 sec  04/20/24  13.35 sec   [facility] Adult PT Treatment:                                                DATE: 06/16/2024 Neuromuscular re-ed: Multi-directional foot taps to colored dots --> added same/opposite arm raises with foot tap SLB + fingertip touch prn Therapeutic Activity: STS + standing side bend with reach overhead  6 step ups with reciprocal stepping pattern + 2#AW 4x30 --> bilateral railing --> one railing Side stepping over yoga egg + 2#AW + UE support Side stepping over 6 tall blocks + SPC --> close SBA/CGA prn Self Care: Ankle pumps with legs elevated in recliner & effleurage massage for lymphedema in legs   [facility] Adult PT Treatment:                                                DATE: 06/14/2024 Therapeutic Exercise: LTR --> flared up R low back S/L hip abd --> position put oo much pressure on bottom hip Seated leg rises over yoga block Neuromuscular re-ed: Counter: Tandem walking Heel walking [place] side stepping over bands --> step behind  Therapeutic Activity: 6 step up/down 6 lateral step up   [facility] Adult PT Treatment:                                                DATE: 06/03/2024 Therapeutic Exercise: Great toe extension --> toe yoga  Toe extension/flexion over edge of box STS straddling corner of table + overhead arm raises & head turns Therapeutic Activity: Resisted lateral step out/in + RTB crossed at ankles Resisted fwd/bkwd stepping + YTB crossed at ankles Step over yoga block up onto 4 box Lateral step up and over 4 box   GAIT: Distance walked: 20 Assistive device utilized: None Level of assistance: Complete Independence Comments: decreased heel strike, flexed knees and hips, short step length    PATIENT EDUCATION:  Education details: Updated HEP  Person educated:  Patient Education method: Explanation, Demonstration, and Handouts Education comprehension: verbalized understanding and returned demonstration  HOME EXERCISE PROGRAM: Access Code: [email or web address] URL: [email or web address] Date:  06/14/2024 Prepared by: Lamarr Price  Exercises - Standing Lumbar Extension with Counter  - 3 x daily - 7 x weekly - 1 sets - 10 reps - Supine Figure 4 Piriformis Stretch  - 1 x daily - 7 x weekly - 3 sets - 10 reps - Supine Lower Trunk Rotation  - 1 x daily - 7 x weekly - 3 sets - 10 reps - QL Stretch Supine  - 1 x daily - 7 x weekly - 3 sets - 10 reps - Seated Isometric Hip Abduction with Belt  - 1 x daily - 7 x weekly - 1-2 sets - 10 reps - 5 sec hold - Wall Push Up  - 1 x daily - 3 x weekly - 2 sets - 10 reps - Tandem Walking with Counter Support  - 1 x daily - 7 x weekly - 3 sets - 10 reps - Heel Walking with Counter Support  - 1 x daily - 7 x weekly - 3 sets - 10 reps - Seated Hip Abduction with Resistance  - 1 x daily - 7 x weekly - 3 sets - 10 reps  ASSESSMENT:  CLINICAL IMPRESSION:  Session focused on stepping activities and dynamic balance; patient challenged with foot tap on back diagonal. Resisted step ups and side stepping added for endurance training and to promote safe foot clearance. Patient challenged with side stepping over raises obstacle; contact guard assist provided as needed during activity.   EVAL: Patient is a 83 y.o. female who was seen today for physical therapy evaluation and treatment for muscles spasms secondary to her scoliosis and for balance deficits secondary to PD. She also reports new onset of R hip pain 5 days ago. The back spasms and hip pain are limiting her ability to walk, perform sit to stand, and perform standing kitchen and bathroom chores. She denies any falls in the past 6 months, but reports that her balance is not good. She uses a walker intermittently, mainly at the EOD and if she gets up during the night.  She has deficits in flexibility and strength as well as posture and balance. She will benefit from skilled PT to address these deficits.  She experienced back spasm when attempting to do a supine hip stretch, so initial trial of DN performed on R QL with good twitch response. Patient reported decreased pain at end of session and was able to move without spasm.  OBJECTIVE IMPAIRMENTS: Abnormal gait, decreased activity tolerance, decreased balance, decreased ROM, decreased strength, increased muscle spasms, impaired flexibility, postural dysfunction, and pain.   ACTIVITY LIMITATIONS: bending, sitting, standing, squatting, sleeping, stairs, transfers, bed mobility, bathing, dressing, hygiene/grooming, and locomotion level  PARTICIPATION LIMITATIONS: meal prep, cleaning, laundry, shopping, and community activity  PERSONAL FACTORS: Age, Fitness, Time since onset of injury/illness/exacerbation, and 3+ comorbidities: OP, PD, scoliosis, HTN are also affecting patient's functional outcome.   REHAB POTENTIAL: Good  CLINICAL DECISION MAKING: Unstable/unpredictable  EVALUATION COMPLEXITY: High   GOALS: Goals reviewed with patient? Yes  SHORT TERM GOALS: Target date: 04/28/24 Decreased back spasms by >= 50% to improve QOL Baseline: Goal status: IN PROGRESS 25% 5/6  2.  Decreased hip pain by 50% with steps, transfers and walking. Baseline:  Goal status: MET  3.  Balance assessment completed at second visit and goals updated. Baseline:  Goal status: MET   LONG TERM GOALS: Target date: 07/27/24  Patient able to perform ADLS without back spasms. Baseline:  Goal status: MET 06/01/24  2.  Pt  able to walk and climb stairs without R hip pain. Baseline:  Goal status: IN PROGRESS 75%  better 5/27  3.  Improved 5xSTS by 5 seconds demonstrating improved strength and decreasing risk for falls. Baseline: 25.08 seconds. Goal status: MET 04/20/24 13.35 sec  4.  Improved BERG score by 8 points showing  functional improvement Baseline: 43/56 05/14/24: 48/56 06/01/24  47/56 Goal status: IN PROCESS  5.  Improve PSFS by 2-3 points  Baseline: see objective Goal status: MET 05/11/24  6.  Patient to demonstrate improved balance and hip strength by being able to enter in/out of her front and back doors safely without LOB. Baseline: Goal status: NEW  7. Patient able to walk in her bathroom and perform toilet transfers with no LOB.  Baseline:  Goal status: NEW  8.  Patient able to clear 6 inch obstacles forward and laterally using her SPC or one UE support demonstrating improved SLS time and balance.  Baseline:  Goal status: NEW   9.  Decreased TUG score to 11.3 sec with SPC demonstrating decreased risk for falls.  Baseline:  Goal status: NEW    PLAN:  PT FREQUENCY: 2x/week  PT DURATION: 8 weeks  PLANNED INTERVENTIONS: 97164- PT Re-evaluation, 97110-Therapeutic exercises, 97530- Therapeutic activity, [id number]- Neuromuscular re-education, 97535- Self Care, [id number]- Manual therapy, [id number]- Gait training, [id number]- Electrical stimulation (unattended), [phone]- Ionotophoresis 4mg /ml Dexamethasone , Patient/Family education, Balance training, Stair training, Taping, Dry Needling, Joint mobilization, Cryotherapy, and Moist heat  PLAN FOR NEXT SESSION: Big stepping activities (dots, over blocks, step ups, etc). Focus on standing up and overs to strengthen hips for clearing entryway at home, SLS activities to increase stance time (bil), Side stepping and low sit to stand transfers for help with commode transfers. PRECAUTION: OSTEOPOROSIS.    Lamarr Price, PTA 06/16/2024, 11:55 AM

## 2024-06-21 ENCOUNTER — Ambulatory Visit

## 2024-06-21 ENCOUNTER — Other Ambulatory Visit: Payer: Self-pay | Admitting: Physician Assistant

## 2024-06-21 DIAGNOSIS — F419 Anxiety disorder, unspecified: Secondary | ICD-10-CM

## 2024-06-21 DIAGNOSIS — M6281 Muscle weakness (generalized): Secondary | ICD-10-CM

## 2024-06-21 DIAGNOSIS — M5459 Other low back pain: Secondary | ICD-10-CM

## 2024-06-21 DIAGNOSIS — M25551 Pain in right hip: Secondary | ICD-10-CM

## 2024-06-21 DIAGNOSIS — R2689 Other abnormalities of gait and mobility: Secondary | ICD-10-CM | POA: Diagnosis not present

## 2024-06-21 DIAGNOSIS — R29818 Other symptoms and signs involving the nervous system: Secondary | ICD-10-CM

## 2024-06-21 DIAGNOSIS — R2681 Unsteadiness on feet: Secondary | ICD-10-CM

## 2024-06-21 DIAGNOSIS — F339 Major depressive disorder, recurrent, unspecified: Secondary | ICD-10-CM

## 2024-06-21 DIAGNOSIS — I10 Essential (primary) hypertension: Secondary | ICD-10-CM

## 2024-06-21 DIAGNOSIS — R252 Cramp and spasm: Secondary | ICD-10-CM

## 2024-06-21 NOTE — Therapy (Signed)
 OUTPATIENT PHYSICAL THERAPY LUMBAR AND LE TREATMENT   Patient Name: Chelsey Estes MRN: [id number] DOB:[birth year], 83 y.o., female Today's Date: 06/21/2024  END OF SESSION:  PT End of Session - 06/21/24 1105     Visit Number 17    Date for PT Re-Evaluation 07/27/24    Authorization Type MCR    Progress Note Due on Visit 23    PT Start Time 1105    PT Stop Time 1145    PT Time Calculation (min) 40 min    Activity Tolerance Patient tolerated treatment well    Behavior During Therapy WFL for tasks assessed/performed         Past Medical History:  Diagnosis Date   Anxiety    Arthritis    Depression    Glaucoma    History of kidney stones    7 lithrotripsy   Hyperlipidemia    Hypertension    Parkinson disease (HCC)    Past Surgical History:  Procedure Laterality Date   ABDOMINAL HYSTERECTOMY     ANTERIOR (CYSTOCELE) AND POSTERIOR REPAIR (RECTOCELE) WITH XENFORM GRAFT AND SACROSPINOUS FIXATION     CATARACT EXTRACTION, BILATERAL     INGUINAL HERNIA REPAIR Left    kidney stones     LUMBAR LAMINECTOMY/DECOMPRESSION MICRODISCECTOMY Right 10/12/2021   Procedure: Right Lumbar four-five Laminectomy for facet/synovial cyst;  Surgeon: Joshua Alm RAMAN, MD;  Location: [facility] OR;  Service: Neurosurgery;  Laterality: Right;   Patient Active Problem List   Diagnosis Date Noted   Dysuria 02/25/2024   Leukocytes in urine 02/25/2024   Pruritus 10/17/2023   Heart palpitations 07/14/2023   History of hypokalemia 07/14/2023   Vitamin D  deficiency 12/02/2022   Kidney stone 11/22/2022   Tinnitus of left ear 07/03/2022   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 07/03/2022   Decreased GFR 04/17/2022   Medication management 04/16/2022   Synovial cyst of lumbar facet joint 09/19/2021   DDD (degenerative disc disease), lumbar 04/11/2021   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 03/09/2021   Palpitations 09/05/2020   SOB (shortness of  breath) 09/05/2020   Chest pain 09/05/2020   Mixed hyperlipidemia 06/06/2020   Primary insomnia 06/06/2020   Primary parkinsonism (HCC) 10/26/2019   Osteoporosis 09/15/2019   Blister of great toe of left foot 08/19/2019   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   Moderate episode of recurrent major depressive disorder (HCC) 02/09/2019   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 02/09/2019   Shuffling gait 02/09/2019   Balance problems 02/09/2019   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   Orthostatic hypotension 02/01/2019   Dizziness 02/01/2019   Primary osteoarthritis of right foot 09/25/2018   Snoring 09/25/2018   Night terrors, adult 09/25/2018   Multiple falls 09/25/2018   Metatarsalgia of left foot 08/20/2018   Chronic foot pain, right 08/20/2018   Left hip pain 04/10/2017   History of shingles 04/08/2017   Adenomatous polyp 04/08/2017   Pure hypercholesterolemia 04/08/2017   History of kidney stones 04/08/2017   Essential hypertension 04/08/2017   Depression, recurrent (HCC) 04/08/2017   Anxiety 04/08/2017   Greater trochanteric bursitis of left hip 04/08/2017    PCP: Antoniette Vermell CROME, PA-C   REFERRING PROVIDER: Evonnie Asberry RAMAN, DO   REFERRING DIAG:  G20.A1 (ICD-10-CM) - Parkinson's disease without dyskinesia or fluctuating manifestations (HCC)  M41.9 (ICD-10-CM) - Scoliosis, unspecified scoliosis type, unspecified spinal region    THERAPY DIAG:  Other abnormalities of gait and mobility  Unsteadiness on feet  Other  low back pain  Pain in right hip  Cramp and spasm  Muscle weakness (generalized)  Other symptoms and signs involving the nervous system  Balance disorder  Rationale for Evaluation and Treatment: Rehabilitation  ONSET DATE: one year  SUBJECTIVE:   SUBJECTIVE STATEMENT:  Patient reports her Rt leg is very sore in front and lateral thigh, states it feels like it might buckle and is most sore when when she is standing with her weight on the  leg.   PERTINENT HISTORY: Osteoporosis (cannot take meds), PD, R L 4/5 laminectomy, HTN, scoliosis, h/o falls PAIN:  Are you having pain? Yes: NPRS scale: 0 today up to 8/10 Pain location: R lumbar Pain description: stabbing spasm Aggravating factors: loading dishwasher, standing in bathroom Relieving factors: sitting  Are you having pain? Yes: NPRS scale: 0/10 Pain location: R thigh pain Pain description: soreness Aggravating factors: sitting over 2 hours Relieving factors: walking   PRECAUTIONS: Fall and Other: OSTEOPOROSIS  RED FLAGS: None   WEIGHT BEARING RESTRICTIONS: No  FALLS:  Has patient fallen in last 6 months? 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  DIAGNOSTIC FINDINGS: none  PATIENT SURVEYS:  The Patient-Specific Functional Scale  Initial:  I am going to ask you to identify up to 3 important activities that you are unable to do or are having difficulty with as a result of this problem.  Today are there any activities that you are unable to do or having difficulty with because of this?  (Patient shown scale and patient rated each activity)  Follow up: When you first came in you had difficulty performing these activities.  Today do you still have difficulty?  Patient-Specific activity scoring scheme (Point to one number):  0 1 2 3 4 5 6 7 8 9  10 Unable                                                                                                          Able to perform To perform                                                                                                     activity at the same Activity         Level as before  Injury or problem  Activity       Cooking/kitchen chores                                                                          Initial:      0                 follow up: 8 on 5/27  2.         Walking                                                                      Initial:         4              follow up: 7 on 5/27  3.         Sit to stand                                                                    Initial:        4               follow up: 7 on 5/27     COGNITION: Overall cognitive status: Within functional limits for tasks assessed     SENSATION: Not tested  MUSCLE LENGTH: B piriformis and HS tightness, R QL and lumbar  POSTURE: rounded shoulders, forward head, flexed trunk , and left thoracic R lumbar scoliosis (convexity)  PALPATION: Marked pain R QL, and R gluteals near greater trochanter  LUMBAR ROM:   Active  A/PROM  eval  Flexion WFL  Extension WFL  Right lateral flexion   Left lateral flexion   Right rotation NT  Left rotation NT   (Blank rows = not tested)   LOWER EXTREMITY ROM: WFL for tasks assessed  Active ROM Right eval Left eval  Hip flexion    Hip extension    Hip abduction    Hip adduction    Hip internal rotation 15* 20  Hip external rotation    Knee flexion    Knee extension    Ankle dorsiflexion    Ankle plantarflexion    Ankle inversion    Ankle eversion     (Blank rows = not tested) * cramping  LOWER EXTREMITY MMT: tested in sitting  MMT Right eval Left eval Right 6/17 Left  6/17  Hip flexion 4 4 4 4   Hip extension      Hip abduction 4 4+ 5 in sitting 5  in sitting  Hip adduction 5 5    Hip internal rotation      Hip external rotation      Knee flexion   5 4+  Knee extension 4+ 4+ 4+ 4  Ankle dorsiflexion 4+ 4+ 5  5  Ankle plantarflexion      Ankle inversion      Ankle eversion       (Blank rows = not tested)   FUNCTIONAL TESTS:  5 times sit to stand: 25.08 sec  04/20/24  13.35 sec   [facility] Adult PT Treatment:                                                DATE: 06/21/2024 Therapeutic Exercise: Seated lateral leg stretch Standing:  Hip flexor stretch Lateral lunge stretch at counter Hip abd/add swing Seated QL stretch  Supine:  HS stretch Figure 4 stretch Manual Therapy: IASTM anterior/lateral Rt thigh, ITB Neuromuscular re-ed: Standing lateral pelvic glide to Lt at wall Step up on 4 step leading with Lt leg Standing side bend to Rt   [facility] Adult PT Treatment:                                                DATE: 06/16/2024 Neuromuscular re-ed: Multi-directional foot taps to colored dots --> added same/opposite arm raises with foot tap SLB + fingertip touch prn Therapeutic Activity: STS + standing side bend with reach overhead  6 step ups with reciprocal stepping pattern + 2#AW 4x30 --> bilateral railing --> one railing Side stepping over yoga egg + 2#AW + UE support Side stepping over 6 tall blocks + SPC --> close SBA/CGA prn Self Care: Ankle pumps with legs elevated in recliner & effleurage massage for lymphedema in legs   [facility] Adult PT Treatment:                                                DATE: 06/14/2024 Therapeutic Exercise: LTR --> flared up R low back S/L hip abd --> position put oo much pressure on bottom hip Seated leg rises over yoga block Neuromuscular re-ed: Counter: Tandem walking Heel walking [place] side stepping over bands --> step behind  Therapeutic Activity: 6 step up/down 6 lateral step up   GAIT: Distance walked: 20 Assistive device utilized: None Level of assistance: Complete Independence Comments: decreased heel strike, flexed knees and hips, short step length    PATIENT EDUCATION:  Education details: Updated HEP  Person educated:  Patient Education method: Explanation, Facilities manager, and Handouts Education comprehension: verbalized understanding and returned demonstration  HOME EXERCISE PROGRAM: Access Code: [email or web address] URL: [email or web address] Date: 06/14/2024 Prepared by: Lamarr Price  Exercises - Standing Lumbar Extension with Counter  - 3 x daily - 7 x weekly - 1 sets - 10 reps - Supine Figure 4 Piriformis Stretch  - 1 x daily - 7 x weekly - 3 sets - 10 reps - Supine Lower Trunk Rotation  - 1 x daily - 7 x weekly - 3 sets - 10 reps - QL Stretch Supine  - 1 x daily - 7 x weekly - 3 sets - 10 reps - Seated Isometric Hip Abduction with Belt  - 1 x daily - 7 x weekly - 1-2 sets - 10 reps - 5 sec hold - Wall Push Up  -  1 x daily - 3 x weekly - 2 sets - 10 reps - Tandem Walking with Counter Support  - 1 x daily - 7 x weekly - 3 sets - 10 reps - Heel Walking with Counter Support  - 1 x daily - 7 x weekly - 3 sets - 10 reps - Seated Hip Abduction with Resistance  - 1 x daily - 7 x weekly - 3 sets - 10 reps  ASSESSMENT:  CLINICAL IMPRESSION:  Session focused on decreasing Rt LE lateral thigh soreness and improving tolerance with weight bearing on Rt LE. Pain/soreness in Rt LE decreased with standing pelvic lateral glides to Lt and standing side bend stretch to Rt side. Recommended patient incorporate those exercises to address pain/discomfort related scoliosis; recommended patient discuss possible interest in dry needling for low back cramping (intervention helped in the past) with PT at next visit.   EVAL: Patient is a 83 y.o. female who was seen today for physical therapy evaluation and treatment for muscles spasms secondary to her scoliosis and for balance deficits secondary to PD. She also reports new onset of R hip pain 5 days ago. The back spasms and hip pain are limiting her ability to walk, perform sit to stand, and perform standing kitchen and bathroom chores. She denies any falls in the past 6  months, but reports that her balance is not good. She uses a walker intermittently, mainly at the EOD and if she gets up during the night. She has deficits in flexibility and strength as well as posture and balance. She will benefit from skilled PT to address these deficits.  She experienced back spasm when attempting to do a supine hip stretch, so initial trial of DN performed on R QL with good twitch response. Patient reported decreased pain at end of session and was able to move without spasm.  OBJECTIVE IMPAIRMENTS: Abnormal gait, decreased activity tolerance, decreased balance, decreased ROM, decreased strength, increased muscle spasms, impaired flexibility, postural dysfunction, and pain.   ACTIVITY LIMITATIONS: bending, sitting, standing, squatting, sleeping, stairs, transfers, bed mobility, bathing, dressing, hygiene/grooming, and locomotion level  PARTICIPATION LIMITATIONS: meal prep, cleaning, laundry, shopping, and community activity  PERSONAL FACTORS: Age, Fitness, Time since onset of injury/illness/exacerbation, and 3+ comorbidities: OP, PD, scoliosis, HTN are also affecting patient's functional outcome.   REHAB POTENTIAL: Good  CLINICAL DECISION MAKING: Unstable/unpredictable  EVALUATION COMPLEXITY: High   GOALS: Goals reviewed with patient? Yes  SHORT TERM GOALS: Target date: 04/28/24 Decreased back spasms by >= 50% to improve QOL Baseline: Goal status: IN PROGRESS 25% 5/6  2.  Decreased hip pain by 50% with steps, transfers and walking. Baseline:  Goal status: MET  3.  Balance assessment completed at second visit and goals updated. Baseline:  Goal status: MET   LONG TERM GOALS: Target date: 07/27/24  Patient able to perform ADLS without back spasms. Baseline:  Goal status: MET 06/01/24  2.  Pt able to walk and climb stairs without R hip pain. Baseline:  Goal status: IN PROGRESS 75%  better 5/27  3.  Improved 5xSTS by 5 seconds demonstrating improved strength  and decreasing risk for falls. Baseline: 25.08 seconds. Goal status: MET 04/20/24 13.35 sec  4.  Improved BERG score by 8 points showing functional improvement Baseline: 43/56 05/14/24: 48/56 06/01/24  47/56 Goal status: IN PROCESS  5.  Improve PSFS by 2-3 points  Baseline: see objective Goal status: MET 05/11/24  6.  Patient to demonstrate improved balance and hip strength by being  able to enter in/out of her front and back doors safely without LOB. Baseline: Goal status: NEW  7. Patient able to walk in her bathroom and perform toilet transfers with no LOB.  Baseline:  Goal status: NEW  8.  Patient able to clear 6 inch obstacles forward and laterally using her SPC or one UE support demonstrating improved SLS time and balance.  Baseline:  Goal status: NEW   9.  Decreased TUG score to 11.3 sec with SPC demonstrating decreased risk for falls.  Baseline:  Goal status: NEW    PLAN:  PT FREQUENCY: 2x/week  PT DURATION: 8 weeks  PLANNED INTERVENTIONS: [id number]- PT Re-evaluation, 97110-Therapeutic exercises, 97530- Therapeutic activity, [id number]- Neuromuscular re-education, 97535- Self Care, [id number]- Manual therapy, [phone]- Gait training, [phone]- Electrical stimulation (unattended), [phone]- Ionotophoresis 4mg /ml Dexamethasone , Patient/Family education, Balance training, Stair training, Taping, Dry Needling, Joint mobilization, Cryotherapy, and Moist heat  PLAN FOR NEXT SESSION: Dry needling? Big stepping activities (dots, over blocks, step ups, etc). Focus on standing up and overs to strengthen hips for clearing entryway at home, SLS activities to increase stance time (bil), Side stepping and low sit to stand transfers for help with commode transfers. PRECAUTION: OSTEOPOROSIS.    Lamarr Price, PTA 06/21/2024, 11:50 AM

## 2024-06-22 ENCOUNTER — Encounter: Admitting: Physical Therapy

## 2024-06-23 NOTE — Therapy (Signed)
 OUTPATIENT PHYSICAL THERAPY LUMBAR AND LE TREATMENT   Patient Name: Chelsey Estes MRN: [id number] DOB:25-Jan-1941, 83 y.o., female Today's Date: 06/23/2024  END OF SESSION:   Past Medical History:  Diagnosis Date   Anxiety    Arthritis    Depression    Glaucoma    History of kidney stones    7 lithrotripsy   Hyperlipidemia    Hypertension    Parkinson disease (HCC)    Past Surgical History:  Procedure Laterality Date   ABDOMINAL HYSTERECTOMY     ANTERIOR (CYSTOCELE) AND POSTERIOR REPAIR (RECTOCELE) WITH XENFORM GRAFT AND SACROSPINOUS FIXATION     CATARACT EXTRACTION, BILATERAL     INGUINAL HERNIA REPAIR Left    kidney stones     LUMBAR LAMINECTOMY/DECOMPRESSION MICRODISCECTOMY Right 10/12/2021   Procedure: Right Lumbar four-five Laminectomy for facet/synovial cyst;  Surgeon: Joshua Alm RAMAN, MD;  Location: [facility] OR;  Service: Neurosurgery;  Laterality: Right;   Patient Active Problem List   Diagnosis Date Noted   Dysuria 02/25/2024   Leukocytes in urine 02/25/2024   Pruritus 10/17/2023   Heart palpitations 07/14/2023   History of hypokalemia 07/14/2023   Vitamin D  deficiency 12/02/2022   Kidney stone 11/22/2022   Tinnitus of left ear 07/03/2022   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 07/03/2022   Decreased GFR 04/17/2022   Medication management 04/16/2022   Synovial cyst of lumbar facet joint 09/19/2021   DDD (degenerative disc disease), lumbar 04/11/2021   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 03/09/2021   Palpitations 09/05/2020   SOB (shortness of breath) 09/05/2020   Chest pain 09/05/2020   Mixed hyperlipidemia 06/06/2020   Primary insomnia 06/06/2020   Primary parkinsonism (HCC) 10/26/2019   Osteoporosis 09/15/2019   Blister of great toe of left foot 08/19/2019   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   Moderate episode of recurrent major depressive disorder (HCC) 02/09/2019   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 02/09/2019   Shuffling gait 02/09/2019   Balance problems 02/09/2019   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   Orthostatic hypotension 02/01/2019   Dizziness 02/01/2019   Primary osteoarthritis of right foot 09/25/2018   Snoring 09/25/2018   Night terrors, adult 09/25/2018   Multiple falls 09/25/2018   Metatarsalgia of left foot 08/20/2018   Chronic foot pain, right 08/20/2018   Left hip pain 04/10/2017   History of shingles 04/08/2017   Adenomatous polyp 04/08/2017   Pure hypercholesterolemia 04/08/2017   History of kidney stones 04/08/2017   Essential hypertension 04/08/2017   Depression, recurrent (HCC) 04/08/2017   Anxiety 04/08/2017   Greater trochanteric bursitis of left hip 04/08/2017    PCP: Antoniette Vermell CROME, PA-C   REFERRING PROVIDER: Evonnie Asberry RAMAN, DO   REFERRING DIAG:  G20.A1 (ICD-10-CM) - Parkinson's disease without dyskinesia or fluctuating manifestations (HCC)  M41.9 (ICD-10-CM) - Scoliosis, unspecified scoliosis type, unspecified spinal region    THERAPY DIAG:  No diagnosis found.  Rationale for Evaluation and Treatment: Rehabilitation  ONSET DATE: one year  SUBJECTIVE:   SUBJECTIVE STATEMENT:  ***   PERTINENT HISTORY: Osteoporosis (cannot take meds), PD, R L 4/5 laminectomy, HTN, scoliosis, h/o falls PAIN:  Are you having pain? Yes: NPRS scale: 0 today up to 8/10 Pain location: R lumbar Pain description: stabbing spasm Aggravating factors: loading dishwasher, standing in bathroom Relieving factors: sitting  Are you having pain? Yes: NPRS scale: 0/10 Pain location: R thigh pain Pain description: soreness Aggravating factors: sitting over 2 hours Relieving factors: walking  PRECAUTIONS: Fall and Other: OSTEOPOROSIS  RED FLAGS: None   WEIGHT BEARING RESTRICTIONS: No  FALLS:  Has patient fallen in last 6 months? 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  DIAGNOSTIC FINDINGS: none  PATIENT SURVEYS:  The Patient-Specific Functional Scale  Initial:  I am going to ask you to identify up to 3 important activities that you are unable to do or are having difficulty with as a result of this problem.  Today are there any activities that you are unable to do or having difficulty with because of this?  (Patient shown scale and patient rated each activity)  Follow up: When you first came in you had difficulty performing these activities.  Today do you still have difficulty?  Patient-Specific activity scoring scheme (Point to one number):  0 1 2 3 4 5 6 7 8 9  10 Unable                                                                                                          Able to perform To perform                                                                                                    activity at the same Activity         Level as before                                                                                                                       Injury or problem  Activity       Cooking/kitchen chores  Initial:      0                 follow up: 8 on 5/27  2.         Walking                                                                      Initial:         4              follow up: 7 on 5/27  3.         Sit to stand                                                                    Initial:        4               follow up: 7 on 5/27     COGNITION: Overall cognitive status: Within functional  limits for tasks assessed     SENSATION: Not tested  MUSCLE LENGTH: B piriformis and HS tightness, R QL and lumbar  POSTURE: rounded shoulders, forward head, flexed trunk , and left thoracic R lumbar scoliosis (convexity)  PALPATION: Marked pain R QL, and R gluteals near greater trochanter  LUMBAR ROM:   Active  A/PROM  eval  Flexion WFL  Extension WFL  Right lateral flexion   Left lateral flexion   Right rotation NT  Left rotation NT   (Blank rows = not tested)   LOWER EXTREMITY ROM: WFL for tasks assessed  Active ROM Right eval Left eval  Hip flexion    Hip extension    Hip abduction    Hip adduction    Hip internal rotation 15* 20  Hip external rotation    Knee flexion    Knee extension    Ankle dorsiflexion    Ankle plantarflexion    Ankle inversion    Ankle eversion     (Blank rows = not tested) * cramping  LOWER EXTREMITY MMT: tested in sitting  MMT Right eval Left eval Right 6/17 Left  6/17  Hip flexion 4 4 4 4   Hip extension      Hip abduction 4 4+ 5 in sitting 5  in sitting  Hip adduction 5 5    Hip internal rotation      Hip external rotation      Knee flexion   5 4+  Knee extension 4+ 4+ 4+ 4  Ankle dorsiflexion 4+ 4+ 5 5  Ankle plantarflexion      Ankle inversion      Ankle eversion       (Blank rows = not tested)   FUNCTIONAL TESTS:  5 times sit to stand: 25.08 sec  04/20/24  13.35 sec    [facility] Adult PT Treatment:  DATE: 06/24/2024 Therapeutic Exercise: Seated lateral leg stretch Standing:  Hip flexor stretch Lateral lunge stretch at counter Hip abd/add swing Seated QL stretch  Supine:  HS stretch Figure 4 stretch Manual Therapy: IASTM anterior/lateral Rt thigh, ITB Neuromuscular re-ed: Standing lateral pelvic glide to Lt at wall Step up on 4 step leading with Lt leg Standing side bend to Rt  [facility] Adult PT Treatment:                                                DATE:  06/21/2024 Therapeutic Exercise: Seated lateral leg stretch Standing:  Hip flexor stretch Lateral lunge stretch at counter Hip abd/add swing Seated QL stretch  Supine:  HS stretch Figure 4 stretch Manual Therapy: IASTM anterior/lateral Rt thigh, ITB Neuromuscular re-ed: Standing lateral pelvic glide to Lt at wall Step up on 4 step leading with Lt leg Standing side bend to Rt   [facility] Adult PT Treatment:                                                DATE: 06/16/2024 Neuromuscular re-ed: Multi-directional foot taps to colored dots --> added same/opposite arm raises with foot tap SLB + fingertip touch prn Therapeutic Activity: STS + standing side bend with reach overhead  6 step ups with reciprocal stepping pattern + 2#AW 4x30 --> bilateral railing --> one railing Side stepping over yoga egg + 2#AW + UE support Side stepping over 6 tall blocks + SPC --> close SBA/CGA prn Self Care: Ankle pumps with legs elevated in recliner & effleurage massage for lymphedema in legs   [facility] Adult PT Treatment:                                                DATE: 06/14/2024 Therapeutic Exercise: LTR --> flared up R low back S/L hip abd --> position put oo much pressure on bottom hip Seated leg rises over yoga block Neuromuscular re-ed: Counter: Tandem walking Heel walking [place] side stepping over bands --> step behind  Therapeutic Activity: 6 step up/down 6 lateral step up   GAIT: Distance walked: 20 Assistive device utilized: None Level of assistance: Complete Independence Comments: decreased heel strike, flexed knees and hips, short step length    PATIENT EDUCATION:  Education details: Updated HEP  Person educated: Patient Education method: Explanation, Facilities manager, and Handouts Education comprehension: verbalized understanding and returned demonstration  HOME EXERCISE PROGRAM: Access Code: [email or web address] URL: [email or web address] Date:  06/14/2024 Prepared by: Lamarr Price  Exercises - Standing Lumbar Extension with Counter  - 3 x daily - 7 x weekly - 1 sets - 10 reps - Supine Figure 4 Piriformis Stretch  - 1 x daily - 7 x weekly - 3 sets - 10 reps - Supine Lower Trunk Rotation  - 1 x daily - 7 x weekly - 3 sets - 10 reps - QL Stretch Supine  - 1 x daily - 7 x weekly - 3 sets - 10 reps - Seated Isometric Hip Abduction with Belt  - 1 x daily - 7 x weekly - 1-2 sets - 10  reps - 5 sec hold - Wall Push Up  - 1 x daily - 3 x weekly - 2 sets - 10 reps - Tandem Walking with Counter Support  - 1 x daily - 7 x weekly - 3 sets - 10 reps - Heel Walking with Counter Support  - 1 x daily - 7 x weekly - 3 sets - 10 reps - Seated Hip Abduction with Resistance  - 1 x daily - 7 x weekly - 3 sets - 10 reps  ASSESSMENT:  CLINICAL IMPRESSION:  ***Session focused on decreasing Rt LE lateral thigh soreness and improving tolerance with weight bearing on Rt LE. Pain/soreness in Rt LE decreased with standing pelvic lateral glides to Lt and standing side bend stretch to Rt side. Recommended patient incorporate those exercises to address pain/discomfort related scoliosis; recommended patient discuss possible interest in dry needling for low back cramping (intervention helped in the past) with PT at next visit.   EVAL: Patient is a 83 y.o. female who was seen today for physical therapy evaluation and treatment for muscles spasms secondary to her scoliosis and for balance deficits secondary to PD. She also reports new onset of R hip pain 5 days ago. The back spasms and hip pain are limiting her ability to walk, perform sit to stand, and perform standing kitchen and bathroom chores. She denies any falls in the past 6 months, but reports that her balance is not good. She uses a walker intermittently, mainly at the EOD and if she gets up during the night. She has deficits in flexibility and strength as well as posture and balance. She will benefit from  skilled PT to address these deficits.  She experienced back spasm when attempting to do a supine hip stretch, so initial trial of DN performed on R QL with good twitch response. Patient reported decreased pain at end of session and was able to move without spasm.  OBJECTIVE IMPAIRMENTS: Abnormal gait, decreased activity tolerance, decreased balance, decreased ROM, decreased strength, increased muscle spasms, impaired flexibility, postural dysfunction, and pain.   ACTIVITY LIMITATIONS: bending, sitting, standing, squatting, sleeping, stairs, transfers, bed mobility, bathing, dressing, hygiene/grooming, and locomotion level  PARTICIPATION LIMITATIONS: meal prep, cleaning, laundry, shopping, and community activity  PERSONAL FACTORS: Age, Fitness, Time since onset of injury/illness/exacerbation, and 3+ comorbidities: OP, PD, scoliosis, HTN are also affecting patient's functional outcome.   REHAB POTENTIAL: Good  CLINICAL DECISION MAKING: Unstable/unpredictable  EVALUATION COMPLEXITY: High   GOALS: Goals reviewed with patient? Yes  SHORT TERM GOALS: Target date: 04/28/24 Decreased back spasms by >= 50% to improve QOL Baseline: Goal status: IN PROGRESS 25% 5/6  2.  Decreased hip pain by 50% with steps, transfers and walking. Baseline:  Goal status: MET  3.  Balance assessment completed at second visit and goals updated. Baseline:  Goal status: MET   LONG TERM GOALS: Target date: 07/27/24  Patient able to perform ADLS without back spasms. Baseline:  Goal status: MET 06/01/24  2.  Pt able to walk and climb stairs without R hip pain. Baseline:  Goal status: IN PROGRESS 75%  better 5/27  3.  Improved 5xSTS by 5 seconds demonstrating improved strength and decreasing risk for falls. Baseline: 25.08 seconds. Goal status: MET 04/20/24 13.35 sec  4.  Improved BERG score by 8 points showing functional improvement Baseline: 43/56 05/14/24: 48/56 06/01/24  47/56 Goal status: IN  PROCESS  5.  Improve PSFS by 2-3 points  Baseline: see objective Goal status: MET 05/11/24  6.  Patient to demonstrate improved balance and hip strength by being able to enter in/out of her front and back doors safely without LOB. Baseline: Goal status: NEW  7. Patient able to walk in her bathroom and perform toilet transfers with no LOB.  Baseline:  Goal status: NEW  8.  Patient able to clear 6 inch obstacles forward and laterally using her SPC or one UE support demonstrating improved SLS time and balance.  Baseline:  Goal status: NEW   9.  Decreased TUG score to 11.3 sec with SPC demonstrating decreased risk for falls.  Baseline:  Goal status: NEW    PLAN:  PT FREQUENCY: 2x/week  PT DURATION: 8 weeks  PLANNED INTERVENTIONS: [id number]- PT Re-evaluation, 97110-Therapeutic exercises, 97530- Therapeutic activity, [id number]- Neuromuscular re-education, 97535- Self Care, [id number]- Manual therapy, [id number]- Gait training, [phone]- Electrical stimulation (unattended), [phone]- Ionotophoresis 4mg /ml Dexamethasone , Patient/Family education, Balance training, Stair training, Taping, Dry Needling, Joint mobilization, Cryotherapy, and Moist heat  PLAN FOR NEXT SESSION: Dry needling? Big stepping activities (dots, over blocks, step ups, etc). Focus on standing up and overs to strengthen hips for clearing entryway at home, SLS activities to increase stance time (bil), Side stepping and low sit to stand transfers for help with commode transfers. PRECAUTION: OSTEOPOROSIS.   Mliss Cummins, PT  06/23/2024, 7:36 PM

## 2024-06-24 ENCOUNTER — Ambulatory Visit: Admitting: Physical Therapy

## 2024-06-24 ENCOUNTER — Encounter: Payer: Self-pay | Admitting: Physical Therapy

## 2024-06-24 DIAGNOSIS — R2689 Other abnormalities of gait and mobility: Secondary | ICD-10-CM | POA: Diagnosis not present

## 2024-06-24 DIAGNOSIS — M25551 Pain in right hip: Secondary | ICD-10-CM

## 2024-06-24 DIAGNOSIS — R252 Cramp and spasm: Secondary | ICD-10-CM

## 2024-06-24 DIAGNOSIS — R2681 Unsteadiness on feet: Secondary | ICD-10-CM

## 2024-06-24 DIAGNOSIS — M5459 Other low back pain: Secondary | ICD-10-CM

## 2024-06-28 NOTE — Therapy (Signed)
 OUTPATIENT PHYSICAL THERAPY LUMBAR AND LE TREATMENT   Patient Name: Chelsey Estes MRN: [id number] DOB:23-May-1941, 83 y.o., female Today's Date: 06/29/2024  END OF SESSION:  PT End of Session - 06/29/24 1359     Visit Number 19    Date for PT Re-Evaluation 07/27/24    Authorization Type MCR    Progress Note Due on Visit 23    PT Start Time 1406    PT Stop Time 1446    PT Time Calculation (min) 40 min    Activity Tolerance Patient tolerated treatment well    Behavior During Therapy WFL for tasks assessed/performed           Past Medical History:  Diagnosis Date   Anxiety    Arthritis    Depression    Glaucoma    History of kidney stones    7 lithrotripsy   Hyperlipidemia    Hypertension    Parkinson disease (HCC)    Past Surgical History:  Procedure Laterality Date   ABDOMINAL HYSTERECTOMY     ANTERIOR (CYSTOCELE) AND POSTERIOR REPAIR (RECTOCELE) WITH XENFORM GRAFT AND SACROSPINOUS FIXATION     CATARACT EXTRACTION, BILATERAL     INGUINAL HERNIA REPAIR Left    kidney stones     LUMBAR LAMINECTOMY/DECOMPRESSION MICRODISCECTOMY Right 10/12/2021   Procedure: Right Lumbar four-five Laminectomy for facet/synovial cyst;  Surgeon: Joshua Alm RAMAN, MD;  Location: [facility] OR;  Service: Neurosurgery;  Laterality: Right;   Patient Active Problem List   Diagnosis Date Noted   Dysuria 02/25/2024   Leukocytes in urine 02/25/2024   Pruritus 10/17/2023   Heart palpitations 07/14/2023   History of hypokalemia 07/14/2023   Vitamin D  deficiency 12/02/2022   Kidney stone 11/22/2022   Tinnitus of left ear 07/03/2022   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 07/03/2022   Decreased GFR 04/17/2022   Medication management 04/16/2022   Synovial cyst of lumbar facet joint 09/19/2021   DDD (degenerative disc disease), lumbar 04/11/2021   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 03/09/2021   Palpitations 09/05/2020   SOB (shortness of  breath) 09/05/2020   Chest pain 09/05/2020   Mixed hyperlipidemia 06/06/2020   Primary insomnia 06/06/2020   Primary parkinsonism (HCC) 10/26/2019   Osteoporosis 09/15/2019   Blister of great toe of left foot 08/19/2019   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   Moderate episode of recurrent major depressive disorder (HCC) 02/09/2019   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 02/09/2019   Shuffling gait 02/09/2019   Balance problems 02/09/2019   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   Orthostatic hypotension 02/01/2019   Dizziness 02/01/2019   Primary osteoarthritis of right foot 09/25/2018   Snoring 09/25/2018   Night terrors, adult 09/25/2018   Multiple falls 09/25/2018   Metatarsalgia of left foot 08/20/2018   Chronic foot pain, right 08/20/2018   Left hip pain 04/10/2017   History of shingles 04/08/2017   Adenomatous polyp 04/08/2017   Pure hypercholesterolemia 04/08/2017   History of kidney stones 04/08/2017   Essential hypertension 04/08/2017   Depression, recurrent (HCC) 04/08/2017   Anxiety 04/08/2017   Greater trochanteric bursitis of left hip 04/08/2017    PCP: Antoniette Vermell CROME, PA-C   REFERRING PROVIDER: Evonnie Asberry RAMAN, DO   REFERRING DIAG:  G20.A1 (ICD-10-CM) - Parkinson's disease without dyskinesia or fluctuating manifestations (HCC)  M41.9 (ICD-10-CM) - Scoliosis, unspecified scoliosis type, unspecified spinal region    THERAPY DIAG:  Other abnormalities of gait and mobility  Other low back  pain  Unsteadiness on feet  Pain in right hip  Cramp and spasm  Muscle weakness (generalized)  Other symptoms and signs involving the nervous system  Balance disorder  Rationale for Evaluation and Treatment: Rehabilitation  ONSET DATE: one year  SUBJECTIVE:   SUBJECTIVE STATEMENT:  Thursday was okay, Friday was terrible and I was worried my cyst was coming  back. Monday sitting in my car I was getting sharp pain in the knee 10 times then diminished and never  amounted to anything. No more pain with WB on R leg. I'm having spasm in R low back with bending. I've been doing more stretches, but not exercising.    PERTINENT HISTORY: Osteoporosis (cannot take meds), PD, R L 4/5 laminectomy, HTN, scoliosis, h/o falls PAIN:  Are you having pain? Yes: NPRS scale: 0 today up to 8/10 Pain location: R lumbar Pain description: stabbing spasm Aggravating factors: loading dishwasher, standing in bathroom Relieving factors: sitting  Are you having pain? Yes: NPRS scale: 0/10 Pain location: R thigh pain Pain description: soreness Aggravating factors: sitting over 2 hours Relieving factors: walking   PRECAUTIONS: Fall and Other: OSTEOPOROSIS  RED FLAGS: None   WEIGHT BEARING RESTRICTIONS: No  FALLS:  Has patient fallen in last 6 months? 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  DIAGNOSTIC FINDINGS: none  PATIENT SURVEYS:  The Patient-Specific Functional Scale  Initial:  I am going to ask you to identify up to 3 important activities that you are unable to do or are having difficulty with as a result of this problem.  Today are there any activities that you are unable to do or having difficulty with because of this?  (Patient shown scale and patient rated each activity)  Follow up: When you first came in you had difficulty performing these activities.  Today do you still have difficulty?  Patient-Specific activity scoring scheme (Point to one number):  0 1 2 3 4 5 6 7 8 9  10 Unable                                                                                                           Able to perform To perform  activity at the same Activity         Level as before                                                                                                                       Injury or problem  Activity       Cooking/kitchen chores                                                                          Initial:      0                 follow up: 8 on 5/27  2.         Walking                                                                      Initial:         4              follow up: 7 on 5/27  3.         Sit to stand                                                                    Initial:        4               follow up: 7 on 5/27     COGNITION: Overall cognitive status: Within functional limits for tasks assessed     SENSATION: Not tested  MUSCLE LENGTH: B piriformis and HS tightness, R QL and lumbar  POSTURE: rounded shoulders, forward head, flexed trunk , and left thoracic R lumbar scoliosis (convexity)  PALPATION: Marked pain R QL, and R gluteals near greater trochanter  LUMBAR ROM:   Active  A/PROM  eval  Flexion WFL  Extension WFL  Right lateral flexion   Left lateral flexion   Right rotation NT  Left rotation NT   (Blank rows = not tested)   LOWER EXTREMITY ROM: WFL for tasks assessed  Active ROM Right eval Left eval  Hip flexion    Hip extension    Hip abduction    Hip adduction  Hip internal rotation 15* 20  Hip external rotation    Knee flexion    Knee extension    Ankle dorsiflexion    Ankle plantarflexion    Ankle inversion    Ankle eversion     (Blank rows = not tested) * cramping  LOWER EXTREMITY MMT: tested in sitting  MMT Right eval Left eval Right 6/17 Left  6/17  Hip flexion 4 4 4 4   Hip extension      Hip abduction 4 4+ 5 in sitting 5  in sitting  Hip  adduction 5 5    Hip internal rotation      Hip external rotation      Knee flexion   5 4+  Knee extension 4+ 4+ 4+ 4  Ankle dorsiflexion 4+ 4+ 5 5  Ankle plantarflexion      Ankle inversion      Ankle eversion       (Blank rows = not tested)   FUNCTIONAL TESTS:  5 times sit to stand: 25.08 sec  04/20/24  13.35 sec  06/24/24 Negative hip scour R Negaive pain with hip PROM Negative pain with standing weight shift onto R LE No pain with MMT of hip and knee, but pain with active hip ABDuction in S/L Pain with ambulation    [facility] Adult PT Treatment:                                                DATE: 06/29/2024 Neuromuscular re-ed: Multi-directional foot taps to colored dots --> added same/opposite arm raises with foot tap Therapeutic Activity: Standing marching with metronome at 50 STS + standing side bend with reach overhead  6 step ups with reciprocal stepping pattern  2x60 --> bilateral railing --> one railing Side stepping over yoga egg + UE support Side stepping over 6 tall blocks + SPC --> close SBA/CGA prn There ex: Standing hip ABD x 10 B Seated QL stretch R Manual: STM with passive stretch to R QL/lumbarl   [facility] Adult PT Treatment:                                                DATE: 06/24/2024 Self care: DN education review; MCR waiver signed Manual Therapy: Trigger Point Dry Needling  Subsequent Treatment: Instructions reviewed, if requested by the patient, prior to subsequent dry needling treatment.   Patient Verbal Consent Given: Yes Education Handout Provided: Previously Provided Muscles Treated: R QL and gluteus medius Electrical Stimulation Performed: No Treatment Response/Outcome: Utilized skilled palpation to identify bony landmarks and trigger points.  Able to illicit twitch response and muscle elongation.  Soft tissue mobilization to muscles needled to further promote tissue elongation and decreased pain.    Therapeutic Activities: Assessed R LE  (see objective)  [facility] Adult PT Treatment:                                                DATE: 06/21/2024 Therapeutic Exercise: Seated lateral leg stretch Standing:  Hip flexor stretch Lateral lunge stretch at counter Hip abd/add swing Seated QL stretch  Supine:  HS stretch Figure 4 stretch Manual Therapy: IASTM anterior/lateral Rt thigh, ITB Neuromuscular re-ed: Standing lateral pelvic glide to Lt at wall Step up on 4 step leading with Lt leg Standing side bend to Rt   [facility] Adult PT Treatment:                                                DATE: 06/16/2024 Neuromuscular re-ed: Multi-directional foot taps to colored dots --> added same/opposite arm raises with foot tap SLB + fingertip touch prn Therapeutic Activity: STS + standing side bend with reach overhead  6 step ups with reciprocal stepping pattern + 2#AW 4x30 --> bilateral railing --> one railing Side stepping over yoga egg + 2#AW + UE support Side stepping over 6 tall blocks + SPC --> close SBA/CGA prn Self Care: Ankle pumps with legs elevated in recliner & effleurage massage for lymphedema in legs   [facility] Adult PT Treatment:                                                DATE: 06/14/2024 Therapeutic Exercise: LTR --> flared up R low back S/L hip abd --> position put oo much pressure on bottom hip Seated leg rises over yoga block Neuromuscular re-ed: Counter: Tandem walking Heel walking [place] side stepping over bands --> step behind  Therapeutic Activity: 6 step up/down 6 lateral step up   GAIT: Distance walked: 20 Assistive device utilized: None Level of assistance: Complete Independence Comments: decreased heel strike, flexed knees and hips, short step length    PATIENT EDUCATION:  Education details: Updated HEP  Person educated: Patient Education method: Explanation, Facilities manager, and Handouts Education comprehension: verbalized understanding and returned demonstration  HOME EXERCISE  PROGRAM: Access Code: [email or web address] URL: [email or web address] Date: 06/14/2024 Prepared by: Lamarr Price  Exercises - Standing Lumbar Extension with Counter  - 3 x daily - 7 x weekly - 1 sets - 10 reps - Supine Figure 4 Piriformis Stretch  - 1 x daily - 7 x weekly - 3 sets - 10 reps - Supine Lower Trunk Rotation  - 1 x daily - 7 x weekly - 3 sets - 10 reps - QL Stretch Supine  - 1 x daily - 7 x weekly - 3 sets - 10 reps - Seated Isometric Hip Abduction with Belt  - 1 x daily - 7 x weekly - 1-2 sets - 10 reps - 5 sec hold - Wall Push Up  - 1 x daily - 3 x weekly - 2 sets - 10 reps - Tandem Walking with Counter Support  - 1 x daily - 7 x weekly - 3 sets - 10 reps - Heel Walking with Counter Support  - 1 x daily - 7 x weekly - 3 sets - 10 reps - Seated Hip Abduction with Resistance  - 1 x daily - 7 x weekly - 3 sets - 10 reps  ASSESSMENT:  CLINICAL IMPRESSION:  Patient with decreased LB and spasm today, but still sore and tight. Able to return to balance activities. Increased Parkinsonian movements today throughout body, but did well with balance despite this. Pt would like to do DN again next visit. She continues to demonstrate potential for improvement and would benefit  from continued skilled therapy to address impairments.     EVAL: Patient is a 83 y.o. female who was seen today for physical therapy evaluation and treatment for muscles spasms secondary to her scoliosis and for balance deficits secondary to PD. She also reports new onset of R hip pain 5 days ago. The back spasms and hip pain are limiting her ability to walk, perform sit to stand, and perform standing kitchen and bathroom chores. She denies any falls in the past 6 months, but reports that her balance is not good. She uses a walker intermittently, mainly at the EOD and if she gets up during the night. She has deficits in flexibility and strength as well as posture and balance. She will benefit from skilled PT to  address these deficits.  She experienced back spasm when attempting to do a supine hip stretch, so initial trial of DN performed on R QL with good twitch response. Patient reported decreased pain at end of session and was able to move without spasm.  OBJECTIVE IMPAIRMENTS: Abnormal gait, decreased activity tolerance, decreased balance, decreased ROM, decreased strength, increased muscle spasms, impaired flexibility, postural dysfunction, and pain.   ACTIVITY LIMITATIONS: bending, sitting, standing, squatting, sleeping, stairs, transfers, bed mobility, bathing, dressing, hygiene/grooming, and locomotion level  PARTICIPATION LIMITATIONS: meal prep, cleaning, laundry, shopping, and community activity  PERSONAL FACTORS: Age, Fitness, Time since onset of injury/illness/exacerbation, and 3+ comorbidities: OP, PD, scoliosis, HTN are also affecting patient's functional outcome.   REHAB POTENTIAL: Good  CLINICAL DECISION MAKING: Unstable/unpredictable  EVALUATION COMPLEXITY: High   GOALS: Goals reviewed with patient? Yes  SHORT TERM GOALS: Target date: 04/28/24 Decreased back spasms by >= 50% to improve QOL Baseline: Goal status: IN PROGRESS 25% 5/6  2.  Decreased hip pain by 50% with steps, transfers and walking. Baseline:  Goal status: MET  3.  Balance assessment completed at second visit and goals updated. Baseline:  Goal status: MET   LONG TERM GOALS: Target date: 07/27/24  Patient able to perform ADLS without back spasms. Baseline:  Goal status: MET 06/01/24  2.  Pt able to walk and climb stairs without R hip pain. Baseline:  Goal status: IN PROGRESS 75%  better 5/27  3.  Improved 5xSTS by 5 seconds demonstrating improved strength and decreasing risk for falls. Baseline: 25.08 seconds. Goal status: MET 04/20/24 13.35 sec  4.  Improved BERG score by 8 points showing functional improvement Baseline: 43/56 05/14/24: 48/56 06/01/24  47/56 Goal status: IN PROCESS  5.  Improve  PSFS by 2-3 points  Baseline: see objective Goal status: MET 05/11/24  6.  Patient to demonstrate improved balance and hip strength by being able to enter in/out of her front and back doors safely without LOB. Baseline: Goal status: NEW  7. Patient able to walk in her bathroom and perform toilet transfers with no LOB.  Baseline:  Goal status: NEW  8.  Patient able to clear 6 inch obstacles forward and laterally using her SPC or one UE support demonstrating improved SLS time and balance.  Baseline:  Goal status: NEW   9.  Decreased TUG score to 11.3 sec with SPC demonstrating decreased risk for falls.  Baseline:  Goal status: NEW    PLAN:  PT FREQUENCY: 2x/week  PT DURATION: 8 weeks  PLANNED INTERVENTIONS: 97164- PT Re-evaluation, 97110-Therapeutic exercises, 97530- Therapeutic activity, 97112- Neuromuscular re-education, 97535- Self Care, [id number]- Manual therapy, [phone]- Gait training, [phone]- Electrical stimulation (unattended), [phone]- Ionotophoresis 4mg /ml Dexamethasone , Patient/Family education, Balance training, Stair  training, Taping, Dry Needling, Joint mobilization, Cryotherapy, and Moist heat  PLAN FOR NEXT SESSION: Dry needling prn.  Big stepping activities (dots, over blocks, step ups, etc). Focus on standing up and overs to strengthen hips for clearing entryway at home, SLS activities to increase stance time (bil), Side stepping and low sit to stand transfers for help with commode transfers. PRECAUTION: OSTEOPOROSIS.   Mliss Cummins, PT  06/29/2024, 2:51 PM

## 2024-06-29 ENCOUNTER — Ambulatory Visit: Admitting: Physical Therapy

## 2024-06-29 ENCOUNTER — Encounter: Payer: Self-pay | Admitting: Physical Therapy

## 2024-06-29 DIAGNOSIS — R2681 Unsteadiness on feet: Secondary | ICD-10-CM

## 2024-06-29 DIAGNOSIS — M25551 Pain in right hip: Secondary | ICD-10-CM

## 2024-06-29 DIAGNOSIS — R2689 Other abnormalities of gait and mobility: Secondary | ICD-10-CM

## 2024-06-29 DIAGNOSIS — M5459 Other low back pain: Secondary | ICD-10-CM

## 2024-06-29 DIAGNOSIS — R252 Cramp and spasm: Secondary | ICD-10-CM

## 2024-06-29 DIAGNOSIS — R29818 Other symptoms and signs involving the nervous system: Secondary | ICD-10-CM

## 2024-06-29 DIAGNOSIS — M6281 Muscle weakness (generalized): Secondary | ICD-10-CM

## 2024-07-01 ENCOUNTER — Encounter: Payer: Self-pay | Admitting: Physical Therapy

## 2024-07-01 ENCOUNTER — Ambulatory Visit: Admitting: Physical Therapy

## 2024-07-01 DIAGNOSIS — M25551 Pain in right hip: Secondary | ICD-10-CM

## 2024-07-01 DIAGNOSIS — R2681 Unsteadiness on feet: Secondary | ICD-10-CM

## 2024-07-01 DIAGNOSIS — M5459 Other low back pain: Secondary | ICD-10-CM

## 2024-07-01 DIAGNOSIS — M6281 Muscle weakness (generalized): Secondary | ICD-10-CM

## 2024-07-01 DIAGNOSIS — R2689 Other abnormalities of gait and mobility: Secondary | ICD-10-CM

## 2024-07-01 DIAGNOSIS — R252 Cramp and spasm: Secondary | ICD-10-CM

## 2024-07-01 DIAGNOSIS — R29818 Other symptoms and signs involving the nervous system: Secondary | ICD-10-CM

## 2024-07-01 NOTE — Therapy (Signed)
 OUTPATIENT PHYSICAL THERAPY LUMBAR AND LE TREATMENT   Patient Name: Chelsey Estes MRN: [id number] DOB:April 05, 1941, 83 y.o., female Today's Date: 07/01/2024  END OF SESSION:  PT End of Session - 07/01/24 0848     Visit Number 20    Date for PT Re-Evaluation 07/27/24    Authorization Type MCR    Progress Note Due on Visit 23    PT Start Time 0847    PT Stop Time 0933    PT Time Calculation (min) 46 min    Activity Tolerance Patient tolerated treatment well            Past Medical History:  Diagnosis Date   Anxiety    Arthritis    Depression    Glaucoma    History of kidney stones    7 lithrotripsy   Hyperlipidemia    Hypertension    Parkinson disease (HCC)    Past Surgical History:  Procedure Laterality Date   ABDOMINAL HYSTERECTOMY     ANTERIOR (CYSTOCELE) AND POSTERIOR REPAIR (RECTOCELE) WITH XENFORM GRAFT AND SACROSPINOUS FIXATION     CATARACT EXTRACTION, BILATERAL     INGUINAL HERNIA REPAIR Left    kidney stones     LUMBAR LAMINECTOMY/DECOMPRESSION MICRODISCECTOMY Right 10/12/2021   Procedure: Right Lumbar four-five Laminectomy for facet/synovial cyst;  Surgeon: Joshua Alm RAMAN, MD;  Location: [facility] OR;  Service: Neurosurgery;  Laterality: Right;   Patient Active Problem List   Diagnosis Date Noted   Dysuria 02/25/2024   Leukocytes in urine 02/25/2024   Pruritus 10/17/2023   Heart palpitations 07/14/2023   History of hypokalemia 07/14/2023   Vitamin D  deficiency 12/02/2022   Kidney stone 11/22/2022   Tinnitus of left ear 07/03/2022   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 07/03/2022   Decreased GFR 04/17/2022   Medication management 04/16/2022   Synovial cyst of lumbar facet joint 09/19/2021   DDD (degenerative disc disease), lumbar 04/11/2021   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 03/09/2021   Palpitations 09/05/2020   SOB (shortness of breath) 09/05/2020   Chest pain 09/05/2020   Mixed  hyperlipidemia 06/06/2020   Primary insomnia 06/06/2020   Primary parkinsonism (HCC) 10/26/2019   Osteoporosis 09/15/2019   Blister of great toe of left foot 08/19/2019   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   Moderate episode of recurrent major depressive disorder (HCC) 02/09/2019   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 02/09/2019   Shuffling gait 02/09/2019   Balance problems 02/09/2019   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   Orthostatic hypotension 02/01/2019   Dizziness 02/01/2019   Primary osteoarthritis of right foot 09/25/2018   Snoring 09/25/2018   Night terrors, adult 09/25/2018   Multiple falls 09/25/2018   Metatarsalgia of left foot 08/20/2018   Chronic foot pain, right 08/20/2018   Left hip pain 04/10/2017   History of shingles 04/08/2017   Adenomatous polyp 04/08/2017   Pure hypercholesterolemia 04/08/2017   History of kidney stones 04/08/2017   Essential hypertension 04/08/2017   Depression, recurrent (HCC) 04/08/2017   Anxiety 04/08/2017   Greater trochanteric bursitis of left hip 04/08/2017    PCP: Antoniette Vermell CROME, PA-C   REFERRING PROVIDER: Evonnie Asberry RAMAN, DO   REFERRING DIAG:  G20.A1 (ICD-10-CM) - Parkinson's disease without dyskinesia or fluctuating manifestations (HCC)  M41.9 (ICD-10-CM) - Scoliosis, unspecified scoliosis type, unspecified spinal region    THERAPY DIAG:  Other abnormalities of gait and mobility  Other low back pain  Unsteadiness on feet  Pain in right  hip  Cramp and spasm  Muscle weakness (generalized)  Other symptoms and signs involving the nervous system  Balance disorder  Rationale for Evaluation and Treatment: Rehabilitation  ONSET DATE: one year  SUBJECTIVE:   SUBJECTIVE STATEMENT:  I'm stiff this morning   PERTINENT HISTORY: Osteoporosis (cannot take meds), PD, R L 4/5 laminectomy, HTN, scoliosis, h/o falls PAIN:  Are you having pain? Yes: NPRS scale: 0 today up to 8/10 Pain location: R lumbar Pain  description: stabbing spasm Aggravating factors: loading dishwasher, standing in bathroom Relieving factors: sitting  Are you having pain? Yes: NPRS scale: 0/10 Pain location: R thigh pain Pain description: soreness Aggravating factors: sitting over 2 hours Relieving factors: walking   PRECAUTIONS: Fall and Other: OSTEOPOROSIS  RED FLAGS: None   WEIGHT BEARING RESTRICTIONS: No  FALLS:  Has patient fallen in last 6 months? 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  DIAGNOSTIC FINDINGS: none  PATIENT SURVEYS:  The Patient-Specific Functional Scale  Initial:  I am going to ask you to identify up to 3 important activities that you are unable to do or are having difficulty with as a result of this problem.  Today are there any activities that you are unable to do or having difficulty with because of this?  (Patient shown scale and patient rated each activity)  Follow up: When you first came in you had difficulty performing these activities.  Today do you still have difficulty?  Patient-Specific activity scoring scheme (Point to one number):  0 1 2 3 4 5 6 7 8 9  10 Unable                                                                                                          Able to perform To perform                                                                                                    activity at the same Activity         Level as before  Injury or  problem  Activity       Cooking/kitchen chores                                                                          Initial:      0                 follow up: 8 on 5/27  2.         Walking                                                                      Initial:         4              follow up: 7 on 5/27  3.         Sit to stand                                                                    Initial:        4               follow up: 7 on 5/27     COGNITION: Overall cognitive status: Within functional limits for tasks assessed     SENSATION: Not tested  MUSCLE LENGTH: B piriformis and HS tightness, R QL and lumbar  POSTURE: rounded shoulders, forward head, flexed trunk , and left thoracic R lumbar scoliosis (convexity)  PALPATION: Marked pain R QL, and R gluteals near greater trochanter  LUMBAR ROM:   Active  A/PROM  eval  Flexion WFL  Extension WFL  Right lateral flexion   Left lateral flexion   Right rotation NT  Left rotation NT   (Blank rows = not tested)   LOWER EXTREMITY ROM: WFL for tasks assessed  Active ROM Right eval Left eval  Hip flexion    Hip extension    Hip abduction    Hip adduction    Hip internal rotation 15* 20  Hip external rotation    Knee flexion    Knee extension    Ankle dorsiflexion    Ankle plantarflexion    Ankle inversion    Ankle eversion     (Blank rows = not tested) * cramping  LOWER EXTREMITY MMT: tested in sitting  MMT Right eval Left eval Right 6/17 Left  6/17  Hip flexion 4 4 4 4   Hip extension      Hip abduction 4 4+ 5 in sitting 5  in sitting  Hip adduction 5 5    Hip internal rotation      Hip external rotation      Knee flexion   5 4+  Knee extension 4+ 4+ 4+  4  Ankle dorsiflexion 4+ 4+ 5 5  Ankle plantarflexion      Ankle inversion      Ankle eversion       (Blank rows = not tested)   FUNCTIONAL TESTS:  5 times sit to stand: 25.08 sec  04/20/24  13.35 sec  06/24/24 Negative hip scour  R Negaive pain with hip PROM Negative pain with standing weight shift onto R LE No pain with MMT of hip and knee, but pain with active hip ABDuction in S/L Pain with ambulation    [facility] Adult PT Treatment:                                                DATE: 07/01/2024 Neuromuscular re-ed: Resisted walking 5# then with red tband (attempted with matrix but too difficult) - close SBA and near counter for UE support as needed Therapeutic Activity: Resisted lateral step ups 6 inch L only with PT pushing against pt to simulate storm door pressure Step taps to blue step with gait belt difficult on L side Curb work - [employer] and obstacle course including wide foam balance beam Reverse walking - pt does at home for short distance in bedroom between bed and chest Reviewed how she uses her RW with toileting There ex: Nustep L5 x 5 min PT present to discuss status   [facility] Adult PT Treatment:                                                DATE: 06/29/2024 Neuromuscular re-ed: Multi-directional foot taps to colored dots --> added same/opposite arm raises with foot tap Therapeutic Activity: Standing marching with metronome at 50 STS + standing side bend with reach overhead  6 step ups with reciprocal stepping pattern  2x60 --> bilateral railing --> one railing Side stepping over yoga egg + UE support Side stepping over 6 tall blocks + SPC --> close SBA/CGA prn There ex: Standing hip ABD x 10 B Seated QL stretch R Manual: STM with passive stretch to R QL/lumbarl   [facility] Adult PT Treatment:                                                DATE: 06/24/2024 Self care: DN education review; MCR waiver signed Manual Therapy: Trigger Point Dry Needling  Subsequent Treatment: Instructions reviewed, if requested by the patient, prior to subsequent dry needling treatment.   Patient Verbal Consent Given: Yes Education Handout Provided: Previously Provided Muscles Treated: R QL and gluteus  medius Electrical Stimulation Performed: No Treatment Response/Outcome: Utilized skilled palpation to identify bony landmarks and trigger points.  Able to illicit twitch response and muscle elongation.  Soft tissue mobilization to muscles needled to further promote tissue elongation and decreased pain.    Therapeutic Activities: Assessed R LE (see objective)  [facility] Adult PT Treatment:  DATE: 06/21/2024 Therapeutic Exercise: Seated lateral leg stretch Standing:  Hip flexor stretch Lateral lunge stretch at counter Hip abd/add swing Seated QL stretch  Supine:  HS stretch Figure 4 stretch Manual Therapy: IASTM anterior/lateral Rt thigh, ITB Neuromuscular re-ed: Standing lateral pelvic glide to Lt at wall Step up on 4 step leading with Lt leg Standing side bend to Rt   [facility] Adult PT Treatment:                                                DATE: 06/16/2024 Neuromuscular re-ed: Multi-directional foot taps to colored dots --> added same/opposite arm raises with foot tap SLB + fingertip touch prn Therapeutic Activity: STS + standing side bend with reach overhead  6 step ups with reciprocal stepping pattern + 2#AW 4x30 --> bilateral railing --> one railing Side stepping over yoga egg + 2#AW + UE support Side stepping over 6 tall blocks + SPC --> close SBA/CGA prn Self Care: Ankle pumps with legs elevated in recliner & effleurage massage for lymphedema in legs   [facility] Adult PT Treatment:                                                DATE: 06/14/2024 Therapeutic Exercise: LTR --> flared up R low back S/L hip abd --> position put oo much pressure on bottom hip Seated leg rises over yoga block Neuromuscular re-ed: Counter: Tandem walking Heel walking [place] side stepping over bands --> step behind  Therapeutic Activity: 6 step up/down 6 lateral step up   GAIT: Distance walked: 20 Assistive device utilized: None Level of  assistance: Complete Independence Comments: decreased heel strike, flexed knees and hips, short step length    PATIENT EDUCATION:  Education details: Updated HEP  Person educated: Patient Education method: Explanation, Facilities manager, and Handouts Education comprehension: verbalized understanding and returned demonstration  HOME EXERCISE PROGRAM: Access Code: [email or web address] URL: [email or web address] Date: 06/14/2024 Prepared by: Lamarr Price  Exercises - Standing Lumbar Extension with Counter  - 3 x daily - 7 x weekly - 1 sets - 10 reps - Supine Figure 4 Piriformis Stretch  - 1 x daily - 7 x weekly - 3 sets - 10 reps - Supine Lower Trunk Rotation  - 1 x daily - 7 x weekly - 3 sets - 10 reps - QL Stretch Supine  - 1 x daily - 7 x weekly - 3 sets - 10 reps - Seated Isometric Hip Abduction with Belt  - 1 x daily - 7 x weekly - 1-2 sets - 10 reps - 5 sec hold - Wall Push Up  - 1 x daily - 3 x weekly - 2 sets - 10 reps - Tandem Walking with Counter Support  - 1 x daily - 7 x weekly - 3 sets - 10 reps - Heel Walking with Counter Support  - 1 x daily - 7 x weekly - 3 sets - 10 reps - Seated Hip Abduction with Resistance  - 1 x daily - 7 x weekly - 3 sets - 10 reps  ASSESSMENT:  CLINICAL IMPRESSION:  Patient reports increased stiffness today due to early morning appointment so we started with the Nustep. She did well with lateral steps with  resistance and demostrates improved ease of stepping laterally over obstacles. Advised pt to move in close to the door transition before stepping over to allow space for her R foot to land. We worked on this in the clinic. Resisted walking was challenging to her balance. Patient reports if she does not specifically focus on heel/toe gait she stumbles. She would benefit from distracted gait training to address this. She had decreased back pain today so we held DN and manual therapy.  EVAL: Patient is a 83 y.o. female who was seen today for  physical therapy evaluation and treatment for muscles spasms secondary to her scoliosis and for balance deficits secondary to PD. She also reports new onset of R hip pain 5 days ago. The back spasms and hip pain are limiting her ability to walk, perform sit to stand, and perform standing kitchen and bathroom chores. She denies any falls in the past 6 months, but reports that her balance is not good. She uses a walker intermittently, mainly at the EOD and if she gets up during the night. She has deficits in flexibility and strength as well as posture and balance. She will benefit from skilled PT to address these deficits.  She experienced back spasm when attempting to do a supine hip stretch, so initial trial of DN performed on R QL with good twitch response. Patient reported decreased pain at end of session and was able to move without spasm.  OBJECTIVE IMPAIRMENTS: Abnormal gait, decreased activity tolerance, decreased balance, decreased ROM, decreased strength, increased muscle spasms, impaired flexibility, postural dysfunction, and pain.   ACTIVITY LIMITATIONS: bending, sitting, standing, squatting, sleeping, stairs, transfers, bed mobility, bathing, dressing, hygiene/grooming, and locomotion level  PARTICIPATION LIMITATIONS: meal prep, cleaning, laundry, shopping, and community activity  PERSONAL FACTORS: Age, Fitness, Time since onset of injury/illness/exacerbation, and 3+ comorbidities: OP, PD, scoliosis, HTN are also affecting patient's functional outcome.   REHAB POTENTIAL: Good  CLINICAL DECISION MAKING: Unstable/unpredictable  EVALUATION COMPLEXITY: High   GOALS: Goals reviewed with patient? Yes  SHORT TERM GOALS: Target date: 04/28/24 Decreased back spasms by >= 50% to improve QOL Baseline: Goal status: IN PROGRESS 25% 5/6  2.  Decreased hip pain by 50% with steps, transfers and walking. Baseline:  Goal status: MET  3.  Balance assessment completed at second visit and goals  updated. Baseline:  Goal status: MET   LONG TERM GOALS: Target date: 07/27/24  Patient able to perform ADLS without back spasms. Baseline:  Goal status: MET 06/01/24  2.  Pt able to walk and climb stairs without R hip pain. Baseline:  Goal status: IN PROGRESS 75%  better 5/27  3.  Improved 5xSTS by 5 seconds demonstrating improved strength and decreasing risk for falls. Baseline: 25.08 seconds. Goal status: MET 04/20/24 13.35 sec  4.  Improved BERG score by 8 points showing functional improvement Baseline: 43/56 05/14/24: 48/56 06/01/24  47/56 Goal status: IN PROCESS  5.  Improve PSFS by 2-3 points  Baseline: see objective Goal status: MET 05/11/24  6.  Patient to demonstrate improved balance and hip strength by being able to enter in/out of her front and back doors safely without LOB. Baseline: Goal status: NEW  7. Patient able to walk in her bathroom and perform toilet transfers with no LOB.  Baseline:  Goal status: NEW  8.  Patient able to clear 6 inch obstacles forward and laterally using her SPC or one UE support demonstrating improved SLS time and balance.  Baseline:  Goal status: NEW  9.  Decreased TUG score to 11.3 sec with SPC demonstrating decreased risk for falls.  Baseline:  Goal status: NEW    PLAN:  PT FREQUENCY: 2x/week  PT DURATION: 8 weeks  PLANNED INTERVENTIONS: [id number]- PT Re-evaluation, 97110-Therapeutic exercises, 97530- Therapeutic activity, [id number]- Neuromuscular re-education, 97535- Self Care, [id number]- Manual therapy, [phone]- Gait training, [phone]- Electrical stimulation (unattended), [phone]- Ionotophoresis 4mg /ml Dexamethasone , Patient/Family education, Balance training, Stair training, Taping, Dry Needling, Joint mobilization, Cryotherapy, and Moist heat  PLAN FOR NEXT SESSION: Add distractions during gait to challenge gait pattern. Dry needling prn.  Big stepping activities (dots, over blocks, step ups, etc). Focus on standing up and overs to  strengthen hips for clearing entryway at home, SLS activities to increase stance time (bil), Side stepping and low sit to stand transfers for help with commode transfers. PRECAUTION: OSTEOPOROSIS.   Mliss Cummins, PT  07/01/2024, 12:59 PM

## 2024-07-05 ENCOUNTER — Ambulatory Visit

## 2024-07-05 DIAGNOSIS — M6281 Muscle weakness (generalized): Secondary | ICD-10-CM

## 2024-07-05 DIAGNOSIS — M5459 Other low back pain: Secondary | ICD-10-CM

## 2024-07-05 DIAGNOSIS — R252 Cramp and spasm: Secondary | ICD-10-CM

## 2024-07-05 DIAGNOSIS — R2689 Other abnormalities of gait and mobility: Secondary | ICD-10-CM | POA: Diagnosis not present

## 2024-07-05 DIAGNOSIS — R29818 Other symptoms and signs involving the nervous system: Secondary | ICD-10-CM

## 2024-07-05 DIAGNOSIS — R2681 Unsteadiness on feet: Secondary | ICD-10-CM

## 2024-07-05 DIAGNOSIS — M25551 Pain in right hip: Secondary | ICD-10-CM

## 2024-07-05 NOTE — Therapy (Signed)
 OUTPATIENT PHYSICAL THERAPY LUMBAR AND LE TREATMENT   Patient Name: Chelsey Estes MRN: [id number] DOB:12/07/1941, 83 y.o., female Today's Date: 07/05/2024  END OF SESSION:  PT End of Session - 07/05/24 1319     Visit Number 21    Date for PT Re-Evaluation 07/27/24    Authorization Type MCR    Progress Note Due on Visit 23    PT Start Time 1320    PT Stop Time 1400    PT Time Calculation (min) 40 min         Past Medical History:  Diagnosis Date   Anxiety    Arthritis    Depression    Glaucoma    History of kidney stones    7 lithrotripsy   Hyperlipidemia    Hypertension    Parkinson disease (HCC)    Past Surgical History:  Procedure Laterality Date   ABDOMINAL HYSTERECTOMY     ANTERIOR (CYSTOCELE) AND POSTERIOR REPAIR (RECTOCELE) WITH XENFORM GRAFT AND SACROSPINOUS FIXATION     CATARACT EXTRACTION, BILATERAL     INGUINAL HERNIA REPAIR Left    kidney stones     LUMBAR LAMINECTOMY/DECOMPRESSION MICRODISCECTOMY Right 10/12/2021   Procedure: Right Lumbar four-five Laminectomy for facet/synovial cyst;  Surgeon: Joshua Alm RAMAN, MD;  Location: [facility] OR;  Service: Neurosurgery;  Laterality: Right;   Patient Active Problem List   Diagnosis Date Noted   Dysuria 02/25/2024   Leukocytes in urine 02/25/2024   Pruritus 10/17/2023   Heart palpitations 07/14/2023   History of hypokalemia 07/14/2023   Vitamin D  deficiency 12/02/2022   Kidney stone 11/22/2022   Tinnitus of left ear 07/03/2022   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 07/03/2022   Decreased GFR 04/17/2022   Medication management 04/16/2022   Synovial cyst of lumbar facet joint 09/19/2021   DDD (degenerative disc disease), lumbar 04/11/2021   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 03/09/2021   Palpitations 09/05/2020   SOB (shortness of breath) 09/05/2020   Chest pain 09/05/2020   Mixed hyperlipidemia 06/06/2020   Primary insomnia 06/06/2020   Primary  parkinsonism (HCC) 10/26/2019   Osteoporosis 09/15/2019   Blister of great toe of left foot 08/19/2019   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   Moderate episode of recurrent major depressive disorder (HCC) 02/09/2019   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 02/09/2019   Shuffling gait 02/09/2019   Balance problems 02/09/2019   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   Orthostatic hypotension 02/01/2019   Dizziness 02/01/2019   Primary osteoarthritis of right foot 09/25/2018   Snoring 09/25/2018   Night terrors, adult 09/25/2018   Multiple falls 09/25/2018   Metatarsalgia of left foot 08/20/2018   Chronic foot pain, right 08/20/2018   Left hip pain 04/10/2017   History of shingles 04/08/2017   Adenomatous polyp 04/08/2017   Pure hypercholesterolemia 04/08/2017   History of kidney stones 04/08/2017   Essential hypertension 04/08/2017   Depression, recurrent (HCC) 04/08/2017   Anxiety 04/08/2017   Greater trochanteric bursitis of left hip 04/08/2017    PCP: Antoniette Vermell CROME, PA-C   REFERRING PROVIDER: Evonnie Asberry RAMAN, DO   REFERRING DIAG:  G20.A1 (ICD-10-CM) - Parkinson's disease without dyskinesia or fluctuating manifestations (HCC)  M41.9 (ICD-10-CM) - Scoliosis, unspecified scoliosis type, unspecified spinal region    THERAPY DIAG:  Pain in right hip  Cramp and spasm  Unsteadiness on feet  Other low back pain  Other abnormalities of gait and mobility  Other symptoms and signs involving the  nervous system  Balance disorder  Muscle weakness (generalized)  Rationale for Evaluation and Treatment: Rehabilitation  ONSET DATE: one year  SUBJECTIVE:   SUBJECTIVE STATEMENT:  Patient reports she has not been sleeping well, states today she feels like her scoliosis is worse.    PERTINENT HISTORY: Osteoporosis (cannot take meds), PD, R L 4/5 laminectomy, HTN, scoliosis, h/o falls PAIN:  Are you having pain? Yes: NPRS scale: 0 today up to 8/10 Pain location: R  lumbar Pain description: stabbing spasm Aggravating factors: loading dishwasher, standing in bathroom Relieving factors: sitting  Are you having pain? Yes: NPRS scale: 0/10 Pain location: R thigh pain Pain description: soreness Aggravating factors: sitting over 2 hours Relieving factors: walking   PRECAUTIONS: Fall and Other: OSTEOPOROSIS  RED FLAGS: None   WEIGHT BEARING RESTRICTIONS: No  FALLS:  Has patient fallen in last 6 months? 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  DIAGNOSTIC FINDINGS: none  PATIENT SURVEYS:  The Patient-Specific Functional Scale  Initial:  I am going to ask you to identify up to 3 important activities that you are unable to do or are having difficulty with as a result of this problem.  Today are there any activities that you are unable to do or having difficulty with because of this?  (Patient shown scale and patient rated each activity)  Follow up: When you first came in you had difficulty performing these activities.  Today do you still have difficulty?  Patient-Specific activity scoring scheme (Point to one number):  0 1 2 3 4 5 6 7 8 9  10 Unable                                                                                                          Able to perform To perform                                                                                                    activity at the same Activity         Level as before  Injury or problem  Activity       Cooking/kitchen chores                                                                          Initial:      0                 follow up: 8 on 5/27  2.         Walking                                                                      Initial:         4              follow up: 7 on 5/27  3.         Sit to stand                                                                    Initial:        4               follow up: 7 on 5/27     COGNITION: Overall cognitive status: Within functional limits for tasks assessed     SENSATION: Not tested  MUSCLE LENGTH: B piriformis and HS tightness, R QL and lumbar  POSTURE: rounded shoulders, forward head, flexed trunk , and left thoracic R lumbar scoliosis (convexity)  PALPATION: Marked pain R QL, and R gluteals near greater trochanter  LUMBAR ROM:   Active  A/PROM  eval  Flexion WFL  Extension WFL  Right lateral flexion   Left lateral flexion   Right rotation NT  Left rotation NT   (Blank rows = not tested)   LOWER EXTREMITY ROM: WFL for tasks assessed  Active ROM Right eval Left eval  Hip flexion    Hip extension    Hip abduction    Hip adduction    Hip internal rotation 15* 20  Hip external rotation    Knee flexion    Knee extension    Ankle dorsiflexion    Ankle plantarflexion    Ankle inversion    Ankle eversion     (Blank rows = not tested) * cramping  LOWER EXTREMITY MMT: tested in sitting  MMT Right eval Left eval Right 6/17 Left  6/17  Hip flexion 4 4 4 4   Hip extension      Hip abduction 4 4+ 5 in sitting 5  in sitting  Hip adduction 5 5    Hip internal rotation      Hip external rotation      Knee flexion   5 4+  Knee extension 4+ 4+ 4+ 4  Ankle dorsiflexion 4+ 4+ 5 5  Ankle plantarflexion      Ankle inversion      Ankle eversion       (Blank rows = not tested)   FUNCTIONAL TESTS:  5 times sit to stand: 25.08 sec  04/20/24  13.35 sec  06/24/24 Negative  hip scour R Negaive pain with hip PROM Negative pain with standing weight shift onto R LE No pain with MMT of hip and knee, but pain with active hip ABDuction in S/L Pain with ambulation     [facility] Adult PT Treatment:                                                DATE: 07/05/2024 Therapeutic Exercise: Seated stretch over green bolster Standing stretch for trunk elongation S/L stretch over green bolster Therapeutic Activity: Stepping over low objects --> focus on AD placement with Rt foot stepping over & setting up closer to object   [facility] Adult PT Treatment:                                                DATE: 07/01/2024 Neuromuscular re-ed: Resisted walking 5# then with red tband (attempted with matrix but too difficult) - close SBA and near counter for UE support as needed Therapeutic Activity: Resisted lateral step ups 6 inch L only with PT pushing against pt to simulate storm door pressure Step taps to blue step with gait belt difficult on L side Curb work - [employer] and obstacle course including wide foam balance beam Reverse walking - pt does at home for short distance in bedroom between bed and chest Reviewed how she uses her RW with toileting There ex: Nustep L5 x 5 min PT present to discuss status   [facility] Adult PT Treatment:                                                DATE: 06/29/2024 Neuromuscular re-ed: Multi-directional foot taps to colored dots --> added same/opposite arm raises with foot tap Therapeutic Activity: Standing marching with metronome at 50 STS + standing side bend with reach overhead  6 step ups with reciprocal stepping pattern  2x60 --> bilateral railing --> one railing Side stepping over yoga egg + UE support Side stepping over 6 tall blocks + SPC --> close SBA/CGA prn There ex: Standing hip ABD x 10 B Seated QL stretch R Manual: STM with passive stretch to R QL/lumbarl   [facility] Adult PT Treatment:                                                DATE:  06/24/2024 Self care: DN education review; MCR waiver signed Manual Therapy: Trigger Point Dry Needling  Subsequent Treatment: Instructions reviewed, if requested by the patient, prior to subsequent dry needling treatment.   Patient Verbal Consent Given: Yes Education Handout Provided: Previously Provided Muscles Treated: R QL and gluteus medius Electrical Stimulation Performed: No  Treatment Response/Outcome: Utilized skilled palpation to identify bony landmarks and trigger points.  Able to illicit twitch response and muscle elongation.  Soft tissue mobilization to muscles needled to further promote tissue elongation and decreased pain.    Therapeutic Activities: Assessed R LE (see objective)   GAIT: Distance walked: 20 Assistive device utilized: None Level of assistance: Complete Independence Comments: decreased heel strike, flexed knees and hips, short step length    PATIENT EDUCATION:  Education details: Updated HEP  Person educated: Patient Education method: Explanation, Demonstration, and Handouts Education comprehension: verbalized understanding and returned demonstration  HOME EXERCISE PROGRAM: Access Code: [email or web address] URL: [email or web address] Date: 06/14/2024 Prepared by: Lamarr Price  Exercises - Standing Lumbar Extension with Counter  - 3 x daily - 7 x weekly - 1 sets - 10 reps - Supine Figure 4 Piriformis Stretch  - 1 x daily - 7 x weekly - 3 sets - 10 reps - Supine Lower Trunk Rotation  - 1 x daily - 7 x weekly - 3 sets - 10 reps - QL Stretch Supine  - 1 x daily - 7 x weekly - 3 sets - 10 reps - Seated Isometric Hip Abduction with Belt  - 1 x daily - 7 x weekly - 1-2 sets - 10 reps - 5 sec hold - Wall Push Up  - 1 x daily - 3 x weekly - 2 sets - 10 reps - Tandem Walking with Counter Support  - 1 x daily - 7 x weekly - 3 sets - 10 reps - Heel Walking with Counter Support  - 1 x daily - 7 x weekly - 3 sets - 10 reps - Seated Hip Abduction with  Resistance  - 1 x daily - 7 x weekly - 3 sets - 10 reps  ASSESSMENT:  CLINICAL IMPRESSION:  Patient presents with increased trunk curve towards left today and a slow gait with intermittent shuffling. Postural stretch variations provided in seated and standing to promote elongation. Stepping over low obstacles continued, focusing on AD placement and safe foot clearance; cueing provided to set feet up closer to object before stepping over for increased stability. Session finished in NuStep to promote reciprocal gait patterning.   EVAL: Patient is a 83 y.o. female who was seen today for physical therapy evaluation and treatment for muscles spasms secondary to her scoliosis and for balance deficits secondary to PD. She also reports new onset of R hip pain 5 days ago. The back spasms and hip pain are limiting her ability to walk, perform sit to stand, and perform standing kitchen and bathroom chores. She denies any falls in the past 6 months, but reports that her balance is not good. She uses a walker intermittently, mainly at the EOD and if she gets up during the night. She has deficits in flexibility and strength as well as posture and balance. She will benefit from skilled PT to address these deficits.  She experienced back spasm when attempting to do a supine hip stretch, so initial trial of DN performed on R QL with good twitch response. Patient reported decreased pain at end of session and was able to move without spasm.  OBJECTIVE IMPAIRMENTS: Abnormal gait, decreased activity tolerance, decreased balance, decreased ROM, decreased strength, increased muscle spasms, impaired flexibility, postural dysfunction, and pain.   ACTIVITY LIMITATIONS: bending, sitting, standing, squatting, sleeping, stairs, transfers, bed mobility, bathing, dressing, hygiene/grooming, and locomotion level  PARTICIPATION LIMITATIONS: meal prep, cleaning, laundry, shopping, and community activity  PERSONAL FACTORS: Age,  Fitness, Time since onset of injury/illness/exacerbation, and 3+ comorbidities: OP, PD, scoliosis, HTN are also affecting patient's functional outcome.   REHAB POTENTIAL: Good  CLINICAL DECISION MAKING: Unstable/unpredictable  EVALUATION COMPLEXITY: High   GOALS: Goals reviewed with patient? Yes  SHORT TERM GOALS: Target date: 04/28/24 Decreased back spasms by >= 50% to improve QOL Baseline: Goal status: IN PROGRESS 25% 5/6  2.  Decreased hip pain by 50% with steps, transfers and walking. Baseline:  Goal status: MET  3.  Balance assessment completed at second visit and goals updated. Baseline:  Goal status: MET   LONG TERM GOALS: Target date: 07/27/24  Patient able to perform ADLS without back spasms. Baseline:  Goal status: MET 06/01/24  2.  Pt able to walk and climb stairs without R hip pain. Baseline: 05/11/24: 75% better  07/05/24: 90% better Goal status: IN PROGRESS   3.  Improved 5xSTS by 5 seconds demonstrating improved strength and decreasing risk for falls. Baseline: 25.08 seconds. Goal status: MET 04/20/24 13.35 sec  4.  Improved BERG score by 8 points showing functional improvement Baseline: 43/56 05/14/24: 48/56 06/01/24  47/56 Goal status: IN PROCESS  5.  Improve PSFS by 2-3 points  Baseline: see objective Goal status: MET 05/11/24  6.  Patient to demonstrate improved balance and hip strength by being able to enter in/out of her front and back doors safely without LOB. Baseline: Goal status: NEW  7. Patient able to walk in her bathroom and perform toilet transfers with no LOB.  Baseline:  Goal status: NEW  8.  Patient able to clear 6 inch obstacles forward and laterally using her SPC or one UE support demonstrating improved SLS time and balance.  Baseline:  Goal status: NEW   9.  Decreased TUG score to 11.3 sec with SPC demonstrating decreased risk for falls.  Baseline:  Goal status: NEW    PLAN:  PT FREQUENCY: 2x/week  PT DURATION: 8  weeks  PLANNED INTERVENTIONS: [id number]- PT Re-evaluation, 97110-Therapeutic exercises, 97530- Therapeutic activity, [id number]- Neuromuscular re-education, 97535- Self Care, [id number]- Manual therapy, [id number]- Gait training, [phone]- Electrical stimulation (unattended), [phone]- Ionotophoresis 4mg /ml Dexamethasone , Patient/Family education, Balance training, Stair training, Taping, Dry Needling, Joint mobilization, Cryotherapy, and Moist heat  PLAN FOR NEXT SESSION: Add distractions during gait to challenge gait pattern. Dry needling prn.  Big stepping activities (dots, over blocks, step ups, etc). Focus on standing up and overs to strengthen hips for clearing entryway at home, SLS activities to increase stance time (bil), Side stepping and low sit to stand transfers for help with commode transfers. PRECAUTION: OSTEOPOROSIS.   Lamarr Price, PTA 07/05/2024, 2:56 PM

## 2024-07-07 ENCOUNTER — Ambulatory Visit

## 2024-07-07 DIAGNOSIS — M25551 Pain in right hip: Secondary | ICD-10-CM

## 2024-07-07 DIAGNOSIS — R29818 Other symptoms and signs involving the nervous system: Secondary | ICD-10-CM

## 2024-07-07 DIAGNOSIS — M5459 Other low back pain: Secondary | ICD-10-CM

## 2024-07-07 DIAGNOSIS — R2681 Unsteadiness on feet: Secondary | ICD-10-CM

## 2024-07-07 DIAGNOSIS — R2689 Other abnormalities of gait and mobility: Secondary | ICD-10-CM

## 2024-07-07 DIAGNOSIS — M6281 Muscle weakness (generalized): Secondary | ICD-10-CM

## 2024-07-07 DIAGNOSIS — R252 Cramp and spasm: Secondary | ICD-10-CM

## 2024-07-07 NOTE — Therapy (Signed)
 OUTPATIENT PHYSICAL THERAPY LUMBAR AND LE TREATMENT   Patient Name: Chelsey Estes MRN: [id number] DOB:June 14, 1941, 83 y.o., female Today's Date: 07/07/2024  END OF SESSION:  PT End of Session - 07/07/24 1151     Visit Number 22    Date for PT Re-Evaluation 07/27/24    Authorization Type MCR    Progress Note Due on Visit 23    PT Start Time 1150    PT Stop Time 1233    PT Time Calculation (min) 43 min    Activity Tolerance Patient tolerated treatment well    Behavior During Therapy WFL for tasks assessed/performed         Past Medical History:  Diagnosis Date   Anxiety    Arthritis    Depression    Glaucoma    History of kidney stones    7 lithrotripsy   Hyperlipidemia    Hypertension    Parkinson disease (HCC)    Past Surgical History:  Procedure Laterality Date   ABDOMINAL HYSTERECTOMY     ANTERIOR (CYSTOCELE) AND POSTERIOR REPAIR (RECTOCELE) WITH XENFORM GRAFT AND SACROSPINOUS FIXATION     CATARACT EXTRACTION, BILATERAL     INGUINAL HERNIA REPAIR Left    kidney stones     LUMBAR LAMINECTOMY/DECOMPRESSION MICRODISCECTOMY Right 10/12/2021   Procedure: Right Lumbar four-five Laminectomy for facet/synovial cyst;  Surgeon: Joshua Alm RAMAN, MD;  Location: [facility] OR;  Service: Neurosurgery;  Laterality: Right;   Patient Active Problem List   Diagnosis Date Noted   Dysuria 02/25/2024   Leukocytes in urine 02/25/2024   Pruritus 10/17/2023   Heart palpitations 07/14/2023   History of hypokalemia 07/14/2023   Vitamin D  deficiency 12/02/2022   Kidney stone 11/22/2022   Tinnitus of left ear 07/03/2022   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 07/03/2022   Decreased GFR 04/17/2022   Medication management 04/16/2022   Synovial cyst of lumbar facet joint 09/19/2021   DDD (degenerative disc disease), lumbar 04/11/2021   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 03/09/2021   Palpitations 09/05/2020   SOB (shortness of  breath) 09/05/2020   Chest pain 09/05/2020   Mixed hyperlipidemia 06/06/2020   Primary insomnia 06/06/2020   Primary parkinsonism (HCC) 10/26/2019   Osteoporosis 09/15/2019   Blister of great toe of left foot 08/19/2019   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   Moderate episode of recurrent major depressive disorder (HCC) 02/09/2019   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 02/09/2019   Shuffling gait 02/09/2019   Balance problems 02/09/2019   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   Orthostatic hypotension 02/01/2019   Dizziness 02/01/2019   Primary osteoarthritis of right foot 09/25/2018   Snoring 09/25/2018   Night terrors, adult 09/25/2018   Multiple falls 09/25/2018   Metatarsalgia of left foot 08/20/2018   Chronic foot pain, right 08/20/2018   Left hip pain 04/10/2017   History of shingles 04/08/2017   Adenomatous polyp 04/08/2017   Pure hypercholesterolemia 04/08/2017   History of kidney stones 04/08/2017   Essential hypertension 04/08/2017   Depression, recurrent (HCC) 04/08/2017   Anxiety 04/08/2017   Greater trochanteric bursitis of left hip 04/08/2017    PCP: Antoniette Vermell CROME, PA-C   REFERRING PROVIDER: Evonnie Asberry RAMAN, DO   REFERRING DIAG:  G20.A1 (ICD-10-CM) - Parkinson's disease without dyskinesia or fluctuating manifestations (HCC)  M41.9 (ICD-10-CM) - Scoliosis, unspecified scoliosis type, unspecified spinal region    THERAPY DIAG:  Pain in right hip  Cramp and spasm  Unsteadiness on feet  Other low back pain  Other abnormalities of gait and mobility  Other symptoms and signs involving the nervous system  Balance disorder  Muscle weakness (generalized)  Rationale for Evaluation and Treatment: Rehabilitation  ONSET DATE: one year  SUBJECTIVE:   SUBJECTIVE STATEMENT:  Patient reports she is feeling better today and slept well last night.    PERTINENT HISTORY: Osteoporosis (cannot take meds), PD, R L 4/5 laminectomy, HTN, scoliosis, h/o  falls PAIN:  Are you having pain? Yes: NPRS scale: 0 today up to 8/10 Pain location: R lumbar Pain description: stabbing spasm Aggravating factors: loading dishwasher, standing in bathroom Relieving factors: sitting  Are you having pain? Yes: NPRS scale: 0/10 Pain location: R thigh pain Pain description: soreness Aggravating factors: sitting over 2 hours Relieving factors: walking   PRECAUTIONS: Fall and Other: OSTEOPOROSIS  RED FLAGS: None   WEIGHT BEARING RESTRICTIONS: No  FALLS:  Has patient fallen in last 6 months? 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  DIAGNOSTIC FINDINGS: none  PATIENT SURVEYS:  The Patient-Specific Functional Scale  Initial:  I am going to ask you to identify up to 3 important activities that you are unable to do or are having difficulty with as a result of this problem.  Today are there any activities that you are unable to do or having difficulty with because of this?  (Patient shown scale and patient rated each activity)  Follow up: When you first came in you had difficulty performing these activities.  Today do you still have difficulty?  Patient-Specific activity scoring scheme (Point to one number):  0 1 2 3 4 5 6 7 8 9  10 Unable                                                                                                          Able to perform To perform                                                                                                    activity at the same Activity         Level as before  Injury or problem  Activity       Cooking/kitchen chores                                                                          Initial:      0                 follow up: 8 on 5/27  2.         Walking                                                                      Initial:         4              follow up: 7 on 5/27  3.         Sit to stand                                                                    Initial:        4               follow up: 7 on 5/27     COGNITION: Overall cognitive status: Within functional limits for tasks assessed     SENSATION: Not tested  MUSCLE LENGTH: B piriformis and HS tightness, R QL and lumbar  POSTURE: rounded shoulders, forward head, flexed trunk , and left thoracic R lumbar scoliosis (convexity)  PALPATION: Marked pain R QL, and R gluteals near greater trochanter  LUMBAR ROM:   Active  A/PROM  eval  Flexion WFL  Extension WFL  Right lateral flexion   Left lateral flexion   Right rotation NT  Left rotation NT   (Blank rows = not tested)   LOWER EXTREMITY ROM: WFL for tasks assessed  Active ROM Right eval Left eval  Hip flexion    Hip extension    Hip abduction    Hip adduction    Hip internal rotation 15* 20  Hip external rotation    Knee flexion    Knee extension    Ankle dorsiflexion    Ankle plantarflexion    Ankle inversion    Ankle eversion     (Blank rows = not tested) * cramping  LOWER EXTREMITY MMT: tested in sitting  MMT Right eval Left eval Right 6/17 Left  6/17  Hip flexion 4 4 4 4   Hip extension      Hip abduction 4 4+ 5 in sitting 5  in sitting  Hip adduction 5 5    Hip internal rotation      Hip external rotation      Knee flexion   5 4+  Knee extension 4+ 4+ 4+ 4  Ankle dorsiflexion 4+ 4+ 5 5  Ankle plantarflexion      Ankle inversion      Ankle eversion       (Blank rows = not  tested)   FUNCTIONAL TESTS:  5 times sit to stand: 25.08 sec  04/20/24  13.35 sec  06/24/24 Negative hip scour R Negaive pain with hip PROM Negative pain with standing weight shift onto R LE No pain with MMT of hip and knee, but pain with active hip ABDuction in S/L Pain with ambulation    [facility] Adult PT Treatment:                                                DATE: 07/07/2024 Therapeutic Exercise: STS + overhead reach in standing  Side step with big arm reach to the side Seated: Hip abd + green TB x15 LAQ + 3#AW 2x10 Therapeutic Activity: 6 black stepper: side step off/on --> straddle stepping on/off Side stepping over blue foam beam Side stepping over yoga block with SPC (SBA) TUG update Cone weaving    [facility] Adult PT Treatment:                                                DATE: 07/05/2024 Therapeutic Exercise: Seated stretch over green bolster Standing stretch for trunk elongation S/L stretch over green bolster Therapeutic Activity: Stepping over low objects --> focus on AD placement with Rt foot stepping over & setting up closer to object   [facility] Adult PT Treatment:                                                DATE: 07/01/2024 Neuromuscular re-ed: Resisted walking 5# then with red tband (attempted with matrix but too difficult) - close SBA and near counter for UE support as needed Therapeutic Activity: Resisted lateral step ups 6 inch L only with PT pushing against pt to simulate storm door pressure Step taps to blue step with gait belt difficult on L side Curb work - [facility] and obstacle course including wide foam balance beam Reverse walking - pt does at home for short distance in bedroom between bed and chest Reviewed how she uses her RW with toileting There ex: Nustep L5 x 5 min PT present to discuss status    [facility] Adult PT Treatment:                                                DATE: 06/24/2024 Self care: DN education review; MCR waiver signed Manual  Therapy: Trigger Point Dry Needling  Subsequent Treatment: Instructions reviewed, if requested by the patient, prior to subsequent dry needling treatment.   Patient Verbal Consent Given: Yes Education Handout Provided: Previously Provided Muscles Treated: R QL and gluteus medius Electrical Stimulation Performed: No Treatment Response/Outcome: Utilized skilled palpation to identify bony landmarks and trigger points.  Able to illicit twitch response and muscle elongation.  Soft tissue mobilization to muscles needled  to further promote tissue elongation and decreased pain.    Therapeutic Activities: Assessed R LE (see objective)   GAIT: Distance walked: 20 Assistive device utilized: None Level of assistance: Complete Independence Comments: decreased heel strike, flexed knees and hips, short step length    PATIENT EDUCATION:  Education details: Updated HEP  Person educated: Patient Education method: Explanation, Demonstration, and Handouts Education comprehension: verbalized understanding and returned demonstration  HOME EXERCISE PROGRAM: Access Code: [email or web address] URL: [email or web address] Date: 06/14/2024 Prepared by: Lamarr Price  Exercises - Standing Lumbar Extension with Counter  - 3 x daily - 7 x weekly - 1 sets - 10 reps - Supine Figure 4 Piriformis Stretch  - 1 x daily - 7 x weekly - 3 sets - 10 reps - Supine Lower Trunk Rotation  - 1 x daily - 7 x weekly - 3 sets - 10 reps - QL Stretch Supine  - 1 x daily - 7 x weekly - 3 sets - 10 reps - Seated Isometric Hip Abduction with Belt  - 1 x daily - 7 x weekly - 1-2 sets - 10 reps - 5 sec hold - Wall Push Up  - 1 x daily - 3 x weekly - 2 sets - 10 reps - Tandem Walking with Counter Support  - 1 x daily - 7 x weekly - 3 sets - 10 reps - Heel Walking with Counter Support  - 1 x daily - 7 x weekly - 3 sets - 10 reps - Seated Hip Abduction with Resistance  - 1 x daily - 7 x weekly - 3 sets - 10  reps  ASSESSMENT:  CLINICAL IMPRESSION:  Side stepping activity progressed from UR support on railing to [facility], providing stand by assist as needed; patient demonstrated improved foot clearance and stability. Decrease in walking pace and unsteadiness noted with turns and directional changes; incorporated cone weaving to address difficulty with directional changes; patient would benefit from continuing with reactive strategies and directional changes.  EVAL: Patient is a 83 y.o. female who was seen today for physical therapy evaluation and treatment for muscles spasms secondary to her scoliosis and for balance deficits secondary to PD. She also reports new onset of R hip pain 5 days ago. The back spasms and hip pain are limiting her ability to walk, perform sit to stand, and perform standing kitchen and bathroom chores. She denies any falls in the past 6 months, but reports that her balance is not good. She uses a walker intermittently, mainly at the EOD and if she gets up during the night. She has deficits in flexibility and strength as well as posture and balance. She will benefit from skilled PT to address these deficits.  She experienced back spasm when attempting to do a supine hip stretch, so initial trial of DN performed on R QL with good twitch response. Patient reported decreased pain at end of session and was able to move without spasm.  OBJECTIVE IMPAIRMENTS: Abnormal gait, decreased activity tolerance, decreased balance, decreased ROM, decreased strength, increased muscle spasms, impaired flexibility, postural dysfunction, and pain.   ACTIVITY LIMITATIONS: bending, sitting, standing, squatting, sleeping, stairs, transfers, bed mobility, bathing, dressing, hygiene/grooming, and locomotion level  PARTICIPATION LIMITATIONS: meal prep, cleaning, laundry, shopping, and community activity  PERSONAL FACTORS: Age, Fitness, Time since onset of injury/illness/exacerbation, and 3+ comorbidities: OP,  PD, scoliosis, HTN are also affecting patient's functional outcome.   REHAB POTENTIAL: Good  CLINICAL DECISION MAKING: Unstable/unpredictable  EVALUATION COMPLEXITY: High   GOALS: Goals  reviewed with patient? Yes  SHORT TERM GOALS: Target date: 04/28/24 Decreased back spasms by >= 50% to improve QOL Baseline: Goal status: IN PROGRESS 25% 5/6  2.  Decreased hip pain by 50% with steps, transfers and walking. Baseline:  Goal status: MET  3.  Balance assessment completed at second visit and goals updated. Baseline:  Goal status: MET   LONG TERM GOALS: Target date: 07/27/24  Patient able to perform ADLS without back spasms. Baseline:  Goal status: MET 06/01/24  2.  Pt able to walk and climb stairs without R hip pain. Baseline: 05/11/24: 75% better  07/05/24: 90% better Goal status: IN PROGRESS   3.  Improved 5xSTS by 5 seconds demonstrating improved strength and decreasing risk for falls. Baseline: 25.08 seconds. Goal status: MET 04/20/24 13.35 sec  4.  Improved BERG score by 8 points showing functional improvement Baseline: 43/56 05/14/24: 48/56 06/01/24  47/56 Goal status: IN PROCESS  5.  Improve PSFS by 2-3 points  Baseline: see objective Goal status: MET 05/11/24  6.  Patient to demonstrate improved balance and hip strength by being able to enter in/out of her front and back doors safely without LOB. Baseline: Goal status: NEW  7. Patient able to walk in her bathroom and perform toilet transfers with no LOB.  Baseline:  Goal status: NEW  8.  Patient able to clear 6 inch obstacles forward and laterally using her SPC or one UE support demonstrating improved SLS time and balance.  Baseline:  Goal status: NEW   9.  Decreased TUG score to 11.3 sec with SPC demonstrating decreased risk for falls.  Baseline:  07/07/24: 16 sec Goal status: NEW    PLAN:  PT FREQUENCY: 2x/week  PT DURATION: 8 weeks  PLANNED INTERVENTIONS: 97164- PT Re-evaluation,  97110-Therapeutic exercises, 97530- Therapeutic activity, 97112- Neuromuscular re-education, 97535- Self Care, [id number]- Manual therapy, [phone]- Gait training, [phone]- Electrical stimulation (unattended), [phone]- Ionotophoresis 4mg /ml Dexamethasone , Patient/Family education, Balance training, Stair training, Taping, Dry Needling, Joint mobilization, Cryotherapy, and Moist heat  PLAN FOR NEXT SESSION: Add distractions during gait to challenge gait pattern. Dry needling prn.  Big stepping activities (dots, over blocks, step ups, etc). Focus on standing up and overs to strengthen hips for clearing entryway at home, SLS activities to increase stance time (bil), Side stepping and low sit to stand transfers for help with commode transfers. PRECAUTION: OSTEOPOROSIS.    Lamarr Price, PTA 07/07/2024, 12:34 PM

## 2024-07-12 NOTE — Therapy (Signed)
 OUTPATIENT PHYSICAL THERAPY LUMBAR AND LE TREATMENT   Patient Name: Chelsey Estes MRN: [id number] DOB:05-16-1941, 83 y.o., female Today's Date: 07/13/2024  END OF SESSION:  PT End of Session - 07/13/24 1104     Visit Number 23    Date for PT Re-Evaluation 07/27/24    Authorization Type MCR    Progress Note Due on Visit 23    PT Start Time 1105    PT Stop Time 1151    PT Time Calculation (min) 46 min    Activity Tolerance Patient tolerated treatment well    Behavior During Therapy WFL for tasks assessed/performed          Past Medical History:  Diagnosis Date   Anxiety    Arthritis    Depression    Glaucoma    History of kidney stones    7 lithrotripsy   Hyperlipidemia    Hypertension    Parkinson disease (HCC)    Past Surgical History:  Procedure Laterality Date   ABDOMINAL HYSTERECTOMY     ANTERIOR (CYSTOCELE) AND POSTERIOR REPAIR (RECTOCELE) WITH XENFORM GRAFT AND SACROSPINOUS FIXATION     CATARACT EXTRACTION, BILATERAL     INGUINAL HERNIA REPAIR Left    kidney stones     LUMBAR LAMINECTOMY/DECOMPRESSION MICRODISCECTOMY Right 10/12/2021   Procedure: Right Lumbar four-five Laminectomy for facet/synovial cyst;  Surgeon: Joshua Alm RAMAN, MD;  Location: [facility] OR;  Service: Neurosurgery;  Laterality: Right;   Patient Active Problem List   Diagnosis Date Noted   Dysuria 02/25/2024   Leukocytes in urine 02/25/2024   Pruritus 10/17/2023   Heart palpitations 07/14/2023   History of hypokalemia 07/14/2023   Vitamin D  deficiency 12/02/2022   Kidney stone 11/22/2022   Tinnitus of left ear 07/03/2022   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 07/03/2022   Decreased GFR 04/17/2022   Medication management 04/16/2022   Synovial cyst of lumbar facet joint 09/19/2021   DDD (degenerative disc disease), lumbar 04/11/2021   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 03/09/2021   Palpitations 09/05/2020   SOB (shortness of  breath) 09/05/2020   Chest pain 09/05/2020   Mixed hyperlipidemia 06/06/2020   Primary insomnia 06/06/2020   Primary parkinsonism (HCC) 10/26/2019   Osteoporosis 09/15/2019   Blister of great toe of left foot 08/19/2019   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   Moderate episode of recurrent major depressive disorder (HCC) 02/09/2019   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 02/09/2019   Shuffling gait 02/09/2019   Balance problems 02/09/2019   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   Orthostatic hypotension 02/01/2019   Dizziness 02/01/2019   Primary osteoarthritis of right foot 09/25/2018   Snoring 09/25/2018   Night terrors, adult 09/25/2018   Multiple falls 09/25/2018   Metatarsalgia of left foot 08/20/2018   Chronic foot pain, right 08/20/2018   Left hip pain 04/10/2017   History of shingles 04/08/2017   Adenomatous polyp 04/08/2017   Pure hypercholesterolemia 04/08/2017   History of kidney stones 04/08/2017   Essential hypertension 04/08/2017   Depression, recurrent (HCC) 04/08/2017   Anxiety 04/08/2017   Greater trochanteric bursitis of left hip 04/08/2017    PCP: Antoniette Vermell CROME, PA-C   REFERRING PROVIDER: Evonnie Asberry RAMAN, DO   REFERRING DIAG:  G20.A1 (ICD-10-CM) - Parkinson's disease without dyskinesia or fluctuating manifestations (HCC)  M41.9 (ICD-10-CM) - Scoliosis, unspecified scoliosis type, unspecified spinal region    THERAPY DIAG:  Pain in right hip  Cramp and spasm  Unsteadiness on  feet  Other low back pain  Other abnormalities of gait and mobility  Rationale for Evaluation and Treatment: Rehabilitation  ONSET DATE: one year  SUBJECTIVE:   SUBJECTIVE STATEMENT:  I get increased pain when I have to bring in my prepared meals that are delivered. I have to undo the box on the porch and then bring in 2/6 meals at a time setting them inside the door. Then two trips from the door to the refrigerator. Then I have to use my rollator to go out the garage  to get the box and ice packs they come with and take them to the trash.    PERTINENT HISTORY: Osteoporosis (cannot take meds), PD, R L 4/5 laminectomy, HTN, scoliosis, h/o falls PAIN:  Are you having pain? Yes: NPRS scale: 0 today up to 8/10 Pain location: R lumbar Pain description: stabbing spasm Aggravating factors: loading dishwasher, standing in bathroom Relieving factors: sitting  Are you having pain? Yes: NPRS scale: 0/10 Pain location: R thigh pain Pain description: soreness Aggravating factors: sitting over 2 hours Relieving factors: walking   PRECAUTIONS: Fall and Other: OSTEOPOROSIS  RED FLAGS: None   WEIGHT BEARING RESTRICTIONS: No  FALLS:  Has patient fallen in last 6 months? 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  DIAGNOSTIC FINDINGS: none  PATIENT SURVEYS:  The Patient-Specific Functional Scale  Initial:  I am going to ask you to identify up to 3 important activities that you are unable to do or are having difficulty with as a result of this problem.  Today are there any activities that you are unable to do or having difficulty with because of this?  (Patient shown scale and patient rated each activity)  Follow up: When you first came in you had difficulty performing these activities.  Today do you still have difficulty?  Patient-Specific activity scoring scheme (Point to one number):  0 1 2 3 4 5 6 7 8 9  10 Unable                                                                                                          Able to perform To perform  activity at the same Activity         Level as before                                                                                                                       Injury or problem  Activity       Cooking/kitchen chores                                                                          Initial:      0                 follow up: 8 on 5/27  2.         Walking                                                                      Initial:         4              follow up: 7 on 5/27  3.         Sit to stand                                                                    Initial:        4               follow up: 7 on 5/27     COGNITION: Overall cognitive status: Within functional limits for tasks assessed     SENSATION: Not tested  MUSCLE LENGTH: B piriformis and HS tightness, R QL and lumbar  POSTURE: rounded shoulders, forward head, flexed trunk , and left thoracic R lumbar scoliosis (convexity)  PALPATION: Marked pain R QL, and R gluteals near greater trochanter  LUMBAR ROM:   Active  A/PROM  eval  Flexion WFL  Extension WFL  Right lateral flexion   Left lateral flexion   Right rotation NT  Left rotation NT   (Blank rows = not tested)   LOWER EXTREMITY ROM: WFL for tasks assessed  Active ROM Right eval Left eval  Hip flexion    Hip extension    Hip abduction    Hip adduction  Hip internal rotation 15* 20  Hip external rotation    Knee flexion    Knee extension    Ankle dorsiflexion    Ankle plantarflexion    Ankle inversion    Ankle eversion     (Blank rows = not tested) * cramping  LOWER EXTREMITY MMT: tested in sitting  MMT Right eval Left eval Right 6/17 Left  6/17  Hip flexion 4 4 4 4   Hip extension      Hip abduction 4 4+ 5 in sitting 5  in sitting  Hip adduction 5 5    Hip internal rotation      Hip external rotation      Knee  flexion   5 4+  Knee extension 4+ 4+ 4+ 4  Ankle dorsiflexion 4+ 4+ 5 5  Ankle plantarflexion      Ankle inversion      Ankle eversion       (Blank rows = not tested)   FUNCTIONAL TESTS:  5 times sit to stand: 25.08 sec  04/20/24  13.35 sec  06/24/24 Negative hip scour R Negaive pain with hip PROM Negative pain with standing weight shift onto R LE No pain with MMT of hip and knee, but pain with active hip ABDuction in S/L Pain with ambulation    [facility] Adult PT Treatment:                                                DATE: 07/13/2024 Therapeutic Exercise: STS + overhead reach in standing  Side step with big arm reach to the side Seated: Hip abd + green TB x15 Ankle eversion B Therapeutic Activity: Cone weaving with SPC multiple cones narrow spaced and wide spaced -CGA Self Care: discussion of use of rhythm or counting to avoid freezing of gait especially when approaching bed.  Manual Therapy: Trigger Point Dry Needling  Subsequent Treatment: Instructions reviewed, if requested by the patient, prior to subsequent dry needling treatment.   Patient Verbal Consent Given: Yes Education Handout Provided: Previously Provided Muscles Treated: R QL and gluteus medius  Electrical Stimulation Performed: No Treatment Response/Outcome: Utilized skilled palpation to identify bony landmarks and trigger points.  Able to illicit twitch response and muscle elongation.  Soft tissue mobilization to muscles needled to further promote tissue elongation and decreased pain.       [facility] Adult PT Treatment:                                                DATE: 07/07/2024 Therapeutic Exercise: STS + overhead reach in standing  Side step with big arm reach to the side Seated: Hip abd + green TB x15 LAQ + 3#AW 2x10 Therapeutic Activity: 6 black stepper: side step off/on --> straddle stepping on/off Side stepping over blue foam beam Side stepping over yoga block with SPC (SBA) TUG update Cone  weaving    [facility] Adult PT Treatment:                                                DATE: 07/05/2024 Therapeutic Exercise: Seated  stretch over green bolster Standing stretch for trunk elongation S/L stretch over green bolster Therapeutic Activity: Stepping over low objects --> focus on AD placement with Rt foot stepping over & setting up closer to object   [facility] Adult PT Treatment:                                                DATE: 07/01/2024 Neuromuscular re-ed: Resisted walking 5# then with red tband (attempted with matrix but too difficult) - close SBA and near counter for UE support as needed Therapeutic Activity: Resisted lateral step ups 6 inch L only with PT pushing against pt to simulate storm door pressure Step taps to blue step with gait belt difficult on L side Curb work - [facility] and obstacle course including wide foam balance beam Reverse walking - pt does at home for short distance in bedroom between bed and chest Reviewed how she uses her RW with toileting There ex: Nustep L5 x 5 min PT present to discuss status    [facility] Adult PT Treatment:                                                DATE: 06/24/2024 Self care: DN education review; MCR waiver signed Manual Therapy: Trigger Point Dry Needling  Subsequent Treatment: Instructions reviewed, if requested by the patient, prior to subsequent dry needling treatment.   Patient Verbal Consent Given: Yes Education Handout Provided: Previously Provided Muscles Treated: R QL and gluteus medius Electrical Stimulation Performed: No Treatment Response/Outcome: Utilized skilled palpation to identify bony landmarks and trigger points.  Able to illicit twitch response and muscle elongation.  Soft tissue mobilization to muscles needled to further promote tissue elongation and decreased pain.    Therapeutic Activities: Assessed R LE (see objective)   GAIT: Distance walked: 20 Assistive device utilized: None Level of assistance:  Complete Independence Comments: decreased heel strike, flexed knees and hips, short step length    PATIENT EDUCATION:  Education details: Updated HEP  Person educated: Patient Education method: Explanation, Demonstration, and Handouts Education comprehension: verbalized understanding and returned demonstration  HOME EXERCISE PROGRAM: Access Code: [email or web address] URL: [email or web address] Date: 06/14/2024 Prepared by: Lamarr Price  Exercises - Standing Lumbar Extension with Counter  - 3 x daily - 7 x weekly - 1 sets - 10 reps - Supine Figure 4 Piriformis Stretch  - 1 x daily - 7 x weekly - 3 sets - 10 reps - Supine Lower Trunk Rotation  - 1 x daily - 7 x weekly - 3 sets - 10 reps - QL Stretch Supine  - 1 x daily - 7 x weekly - 3 sets - 10 reps - Seated Isometric Hip Abduction with Belt  - 1 x daily - 7 x weekly - 1-2 sets - 10 reps - 5 sec hold - Wall Push Up  - 1 x daily - 3 x weekly - 2 sets - 10 reps - Tandem Walking with Counter Support  - 1 x daily - 7 x weekly - 3 sets - 10 reps - Heel Walking with Counter Support  - 1 x daily - 7 x weekly - 3 sets - 10 reps - Seated Hip Abduction with Resistance  - 1  x daily - 7 x weekly - 3 sets - 10 reps  ASSESSMENT:  CLINICAL IMPRESSION:  Patient reporting no pain at present, but reports her ongoing back pain after ADLS including loading dishwasher and bringing in her delivered meals which occurs 1x/wk. She requested DN/MT today and had excellent responses in both muscles needled. She also reports that she is experiencing left foot inversion with gait to where she steps on her other foot. She demonstrates good eversion strength and gait disturbance is likely due to PD or hip weakness. This was not evident today with any of the gait activities we did or with her just walking around the clinic. She was challenged again with cone weaving when cones are 1-1.5 ft apart requiring a shorter step length and occasionally stopping before  proceeding. When she weaved between cones with 3 ft spacing she did not have difficulty.   EVAL: Patient is a 82 y.o. female who was seen today for physical therapy evaluation and treatment for muscles spasms secondary to her scoliosis and for balance deficits secondary to PD. She also reports new onset of R hip pain 5 days ago. The back spasms and hip pain are limiting her ability to walk, perform sit to stand, and perform standing kitchen and bathroom chores. She denies any falls in the past 6 months, but reports that her balance is not good. She uses a walker intermittently, mainly at the EOD and if she gets up during the night. She has deficits in flexibility and strength as well as posture and balance. She will benefit from skilled PT to address these deficits.  She experienced back spasm when attempting to do a supine hip stretch, so initial trial of DN performed on R QL with good twitch response. Patient reported decreased pain at end of session and was able to move without spasm.  OBJECTIVE IMPAIRMENTS: Abnormal gait, decreased activity tolerance, decreased balance, decreased ROM, decreased strength, increased muscle spasms, impaired flexibility, postural dysfunction, and pain.   ACTIVITY LIMITATIONS: bending, sitting, standing, squatting, sleeping, stairs, transfers, bed mobility, bathing, dressing, hygiene/grooming, and locomotion level  PARTICIPATION LIMITATIONS: meal prep, cleaning, laundry, shopping, and community activity  PERSONAL FACTORS: Age, Fitness, Time since onset of injury/illness/exacerbation, and 3+ comorbidities: OP, PD, scoliosis, HTN are also affecting patient's functional outcome.   REHAB POTENTIAL: Good  CLINICAL DECISION MAKING: Unstable/unpredictable  EVALUATION COMPLEXITY: High   GOALS: Goals reviewed with patient? Yes  SHORT TERM GOALS: Target date: 04/28/24 Decreased back spasms by >= 50% to improve QOL Baseline: Goal status: IN PROGRESS 25% 5/6  2.   Decreased hip pain by 50% with steps, transfers and walking. Baseline:  Goal status: MET  3.  Balance assessment completed at second visit and goals updated. Baseline:  Goal status: MET   LONG TERM GOALS: Target date: 07/27/24  Patient able to perform ADLS without back spasms. Baseline:  Goal status: MET 06/01/24  2.  Pt able to walk and climb stairs without R hip pain. Baseline: 05/11/24: 75% better  07/05/24: 90% better Goal status: IN PROGRESS   3.  Improved 5xSTS by 5 seconds demonstrating improved strength and decreasing risk for falls. Baseline: 25.08 seconds. Goal status: MET 04/20/24 13.35 sec  4.  Improved BERG score by 8 points showing functional improvement Baseline: 43/56 05/14/24: 48/56 06/01/24  47/56 Goal status: IN PROCESS  5.  Improve PSFS by 2-3 points  Baseline: see objective Goal status: MET 05/11/24  6.  Patient to demonstrate improved balance and hip strength by  being able to enter in/out of her front and back doors safely without LOB. Baseline: Goal status: NEW  7. Patient able to walk in her bathroom and perform toilet transfers with no LOB.  Baseline:  Goal status: NEW  8.  Patient able to clear 6 inch obstacles forward and laterally using her SPC or one UE support demonstrating improved SLS time and balance.  Baseline:  Goal status: NEW   9.  Decreased TUG score to 11.3 sec with SPC demonstrating decreased risk for falls.  Baseline:  07/07/24: 16 sec Goal status: NEW    PLAN:  PT FREQUENCY: 2x/week  PT DURATION: 8 weeks  PLANNED INTERVENTIONS: [id number]- PT Re-evaluation, 97110-Therapeutic exercises, 97530- Therapeutic activity, 97112- Neuromuscular re-education, 97535- Self Care, [id number]- Manual therapy, [phone]- Gait training, [phone]- Electrical stimulation (unattended), [phone]- Ionotophoresis 4mg /ml Dexamethasone , Patient/Family education, Balance training, Stair training, Taping, Dry Needling, Joint mobilization, Cryotherapy, and Moist  heat  PLAN FOR NEXT SESSION: Add distractions during gait to challenge gait pattern. Assess DN.  Big stepping activities (dots, over blocks, step ups, etc). Focus on standing up and overs to strengthen hips for clearing entryway at home, SLS activities to increase stance time (bil), Side stepping and low sit to stand transfers for help with commode transfers. PRECAUTION: OSTEOPOROSIS.    Mliss Cummins, PT  07/13/2024, 12:09 PM

## 2024-07-13 ENCOUNTER — Encounter: Payer: Self-pay | Admitting: Physical Therapy

## 2024-07-13 ENCOUNTER — Ambulatory Visit: Admitting: Physical Therapy

## 2024-07-13 DIAGNOSIS — R252 Cramp and spasm: Secondary | ICD-10-CM

## 2024-07-13 DIAGNOSIS — R2689 Other abnormalities of gait and mobility: Secondary | ICD-10-CM

## 2024-07-13 DIAGNOSIS — R2681 Unsteadiness on feet: Secondary | ICD-10-CM

## 2024-07-13 DIAGNOSIS — M25551 Pain in right hip: Secondary | ICD-10-CM

## 2024-07-13 DIAGNOSIS — M5459 Other low back pain: Secondary | ICD-10-CM

## 2024-07-14 NOTE — Therapy (Signed)
 OUTPATIENT PHYSICAL THERAPY LUMBAR AND LE TREATMENT AND  PROGRESS NOTE  Reporting Period 06/01/24 to 07/15/24   See note below for Objective Data and Assessment of Progress/Goals.   Patient Name: Chelsey Estes MRN: [id number] DOB:1941/12/16, 83 y.o., female Today's Date: 07/15/2024  END OF SESSION:  PT End of Session - 07/15/24 1019     Visit Number 24    Date for PT Re-Evaluation 07/27/24    Authorization Type MCR    Progress Note Due on Visit 34    PT Start Time 0935    PT Stop Time 1019    PT Time Calculation (min) 44 min    Activity Tolerance Patient tolerated treatment well    Behavior During Therapy WFL for tasks assessed/performed           Past Medical History:  Diagnosis Date   Anxiety    Arthritis    Depression    Glaucoma    History of kidney stones    7 lithrotripsy   Hyperlipidemia    Hypertension    Parkinson disease (HCC)    Past Surgical History:  Procedure Laterality Date   ABDOMINAL HYSTERECTOMY     ANTERIOR (CYSTOCELE) AND POSTERIOR REPAIR (RECTOCELE) WITH XENFORM GRAFT AND SACROSPINOUS FIXATION     CATARACT EXTRACTION, BILATERAL     INGUINAL HERNIA REPAIR Left    kidney stones     LUMBAR LAMINECTOMY/DECOMPRESSION MICRODISCECTOMY Right 10/12/2021   Procedure: Right Lumbar four-five Laminectomy for facet/synovial cyst;  Surgeon: Joshua Alm RAMAN, MD;  Location: [facility] OR;  Service: Neurosurgery;  Laterality: Right;   Patient Active Problem List   Diagnosis Date Noted   Dysuria 02/25/2024   Leukocytes in urine 02/25/2024   Pruritus 10/17/2023   Heart palpitations 07/14/2023   History of hypokalemia 07/14/2023   Vitamin D  deficiency 12/02/2022   Kidney stone 11/22/2022   Tinnitus of left ear 07/03/2022   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 07/03/2022   Decreased GFR 04/17/2022   Medication management 04/16/2022   Synovial cyst of lumbar facet joint 09/19/2021   DDD (degenerative disc disease), lumbar  04/11/2021   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 03/09/2021   Palpitations 09/05/2020   SOB (shortness of breath) 09/05/2020   Chest pain 09/05/2020   Mixed hyperlipidemia 06/06/2020   Primary insomnia 06/06/2020   Primary parkinsonism (HCC) 10/26/2019   Osteoporosis 09/15/2019   Blister of great toe of left foot 08/19/2019   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   Moderate episode of recurrent major depressive disorder (HCC) 02/09/2019   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 02/09/2019   Shuffling gait 02/09/2019   Balance problems 02/09/2019   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   Orthostatic hypotension 02/01/2019   Dizziness 02/01/2019   Primary osteoarthritis of right foot 09/25/2018   Snoring 09/25/2018   Night terrors, adult 09/25/2018   Multiple falls 09/25/2018   Metatarsalgia of left foot 08/20/2018   Chronic foot pain, right 08/20/2018   Left hip pain 04/10/2017   History of shingles 04/08/2017   Adenomatous polyp 04/08/2017   Pure hypercholesterolemia 04/08/2017   History of kidney stones 04/08/2017   Essential hypertension 04/08/2017   Depression, recurrent (HCC) 04/08/2017   Anxiety 04/08/2017   Greater trochanteric bursitis of left hip 04/08/2017    PCP: Antoniette Vermell CROME, PA-C   REFERRING PROVIDER: Evonnie Asberry RAMAN, DO   REFERRING DIAG:  G20.A1 (ICD-10-CM) - Parkinson's disease without dyskinesia or fluctuating manifestations (HCC)  M41.9 (ICD-10-CM) - Scoliosis,  unspecified scoliosis type, unspecified spinal region    THERAPY DIAG:  Pain in right hip  Cramp and spasm  Unsteadiness on feet  Other low back pain  Other abnormalities of gait and mobility  Rationale for Evaluation and Treatment: Rehabilitation  ONSET DATE: one year  SUBJECTIVE:   SUBJECTIVE STATEMENT:  The needling helped. I used the counting when moving toward my bed and it helped with the gait freezing.    PERTINENT HISTORY: Osteoporosis (cannot take  meds), PD, R L 4/5 laminectomy, HTN, scoliosis, h/o falls PAIN:  Are you having pain? Yes: NPRS scale: 0 today up to 8/10 Pain location: R lumbar Pain description: stabbing spasm Aggravating factors: loading dishwasher, standing in bathroom Relieving factors: sitting  Are you having pain? Yes: NPRS scale: 0/10 Pain location: R thigh pain Pain description: soreness Aggravating factors: sitting over 2 hours Relieving factors: walking   PRECAUTIONS: Fall and Other: OSTEOPOROSIS  RED FLAGS: None   WEIGHT BEARING RESTRICTIONS: No  FALLS:  Has patient fallen in last 6 months? 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  DIAGNOSTIC FINDINGS: none  PATIENT SURVEYS:  The Patient-Specific Functional Scale  Initial:  I am going to ask you to identify up to 3 important activities that you are unable to do or are having difficulty with as a result of this problem.  Today are there any activities that you are unable to do or having difficulty with because of this?  (Patient shown scale and patient rated each activity)  Follow up: When you first came in you had difficulty performing these activities.  Today do you still have difficulty?  Patient-Specific activity scoring scheme (Point to one number):  0 1 2 3 4 5 6 7 8 9  10 Unable                                                                                                          Able to perform To perform                                                                                                    activity at the  same Activity         Level as before  Injury or problem  Activity       Cooking/kitchen chores                                                                          Initial:      0                 follow up: 8 on 5/27  2.         Walking                                                                      Initial:         4              follow up: 7 on 5/27  3.         Sit to stand                                                                    Initial:        4               follow up: 7 on 5/27     COGNITION: Overall cognitive status: Within functional limits for tasks assessed     SENSATION: Not tested  MUSCLE LENGTH: B piriformis and HS tightness, R QL and lumbar  POSTURE: rounded shoulders, forward head, flexed trunk , and left thoracic R lumbar scoliosis (convexity)  PALPATION: Marked pain R QL, and R gluteals near greater trochanter  LUMBAR ROM:   Active  A/PROM  eval  Flexion WFL  Extension WFL  Right lateral flexion   Left lateral flexion   Right rotation NT  Left rotation NT   (Blank rows = not tested)   LOWER EXTREMITY ROM: WFL for tasks assessed  Active ROM Right eval Left eval  Hip flexion    Hip extension    Hip abduction    Hip adduction    Hip internal rotation 15* 20  Hip external rotation    Knee flexion    Knee extension    Ankle dorsiflexion    Ankle plantarflexion    Ankle inversion    Ankle eversion     (Blank rows = not tested) * cramping  LOWER EXTREMITY MMT: tested in sitting  MMT Right eval Left eval Right 6/17 Left  6/17  Hip flexion 4 4 4 4   Hip extension      Hip abduction 4 4+ 5 in sitting 5  in sitting  Hip adduction 5 5    Hip internal rotation      Hip external rotation      Knee flexion   5 4+  Knee extension 4+ 4+ 4+ 4  Ankle dorsiflexion 4+ 4+ 5 5  Ankle plantarflexion      Ankle  inversion      Ankle eversion       (Blank rows = not tested)   FUNCTIONAL TESTS:  5 times sit to stand: 25.08 sec  04/20/24  13.35 sec   07/15/24 TUG 12.13 sec  06/24/24 Negative hip scour R Negaive pain with hip PROM Negative pain with standing weight shift onto R LE No pain with MMT of hip and knee, but pain with active hip ABDuction in S/L Pain with ambulation   [facility] Adult PT Treatment:                                                DATE: 07/15/2024 Therapeutic Exercise: STS + overhead reach in standing x 10, then with bil shoulder ext and chest forward x 10 Seated: Hip abd + green TB x15 LAQ + 3#AW 2x10 Therapeutic Activity: gait belt/SBA with all TA  Big Walking arms and legs - longer strides and working on gait swing PWR moves in standing - trunk rotation with hand clap, step outs laterally with UE reach x 10  Walk around the square - with cognitive challenges  Zig Zag Drill with cones  Figure 8 - (2 circles set up with cones) - more challenging with left foot near circle TUG/goals assessed     [facility] Adult PT Treatment:                                                DATE: 07/13/2024 Therapeutic Exercise: STS + overhead reach in standing  Side step with big arm reach to the side Seated: Hip abd + green TB x15 Ankle eversion B Therapeutic Activity: Cone weaving with SPC multiple cones narrow spaced and wide spaced -CGA Self Care: discussion of use of rhythm or counting to avoid freezing of gait especially when approaching bed.  Manual Therapy: Trigger Point Dry Needling  Subsequent Treatment: Instructions reviewed, if requested by the patient, prior to subsequent dry needling treatment.   Patient Verbal Consent Given: Yes Education Handout Provided: Previously Provided Muscles Treated: R QL and gluteus medius  Electrical Stimulation Performed: No Treatment Response/Outcome: Utilized skilled palpation to identify bony landmarks and trigger points.  Able to illicit  twitch response and muscle elongation.  Soft tissue mobilization to muscles needled to further promote tissue elongation and decreased pain.       [facility] Adult PT Treatment:                                                DATE: 07/07/2024 Therapeutic Exercise: STS + overhead reach in standing  Side step with big arm reach to the side Seated: Hip abd + green TB x15 LAQ + 3#AW 2x10 Therapeutic Activity: 6 black stepper: side step off/on --> straddle stepping on/off Side stepping over blue foam beam Side stepping over yoga block with SPC (SBA) TUG update Cone weaving    [facility] Adult PT Treatment:  DATE: 07/05/2024 Therapeutic Exercise: Seated stretch over green bolster Standing stretch for trunk elongation S/L stretch over green bolster Therapeutic Activity: Stepping over low objects --> focus on AD placement with Rt foot stepping over & setting up closer to object   [facility] Adult PT Treatment:                                                DATE: 07/01/2024 Neuromuscular re-ed: Resisted walking 5# then with red tband (attempted with matrix but too difficult) - close SBA and near counter for UE support as needed Therapeutic Activity: Resisted lateral step ups 6 inch L only with PT pushing against pt to simulate storm door pressure Step taps to blue step with gait belt difficult on L side Curb work - [facility] and obstacle course including wide foam balance beam Reverse walking - pt does at home for short distance in bedroom between bed and chest Reviewed how she uses her RW with toileting There ex: Nustep L5 x 5 min PT present to discuss status    [facility] Adult PT Treatment:                                                DATE: 06/24/2024 Self care: DN education review; MCR waiver signed Manual Therapy: Trigger Point Dry Needling  Subsequent Treatment: Instructions reviewed, if requested by the patient, prior to subsequent dry needling treatment.    Patient Verbal Consent Given: Yes Education Handout Provided: Previously Provided Muscles Treated: R QL and gluteus medius Electrical Stimulation Performed: No Treatment Response/Outcome: Utilized skilled palpation to identify bony landmarks and trigger points.  Able to illicit twitch response and muscle elongation.  Soft tissue mobilization to muscles needled to further promote tissue elongation and decreased pain.    Therapeutic Activities: Assessed R LE (see objective)   GAIT: Distance walked: 20 Assistive device utilized: None Level of assistance: Complete Independence Comments: decreased heel strike, flexed knees and hips, short step length    PATIENT EDUCATION:  Education details: Updated HEP  Person educated: Patient Education method: Explanation, Demonstration, and Handouts Education comprehension: verbalized understanding and returned demonstration  HOME EXERCISE PROGRAM: Access Code: [email or web address] URL: [email or web address] Date: 06/14/2024 Prepared by: Lamarr Price  Exercises - Standing Lumbar Extension with Counter  - 3 x daily - 7 x weekly - 1 sets - 10 reps - Supine Figure 4 Piriformis Stretch  - 1 x daily - 7 x weekly - 3 sets - 10 reps - Supine Lower Trunk Rotation  - 1 x daily - 7 x weekly - 3 sets - 10 reps - QL Stretch Supine  - 1 x daily - 7 x weekly - 3 sets - 10 reps - Seated Isometric Hip Abduction with Belt  - 1 x daily - 7 x weekly - 1-2 sets - 10 reps - 5 sec hold - Wall Push Up  - 1 x daily - 3 x weekly - 2 sets - 10 reps - Tandem Walking with Counter Support  - 1 x daily - 7 x weekly - 3 sets - 10 reps - Heel Walking with Counter Support  - 1 x daily - 7 x weekly - 3 sets - 10 reps - Seated Hip Abduction  with Resistance  - 1 x daily - 7 x weekly - 3 sets - 10 reps  ASSESSMENT:  CLINICAL IMPRESSION:  Chelsey Estes  is progressing with her LTGs. She is now able to walk in her bathroom and do toilet transfers without LOB meeting LTG  #7. She reports 25% improvement in entering her front and back doors safely showing improved functional strength. This is still difficult for her due to it requiring a lateral step over. She improved her TUG score today by 4 seconds and is close to meeting this goal as well. Chelsey Estes's progress has been somewhat limited due to the return of her back spasms. She continues to get relief from DN and generally is able to return to regular treatment by the next session. She did very well with PD PWR moves today in standing and these were a good warm up activity. She would benefit from the PWR weight shift reach as well. Cone negotiation was more challenging, particularly with figure 8's and with narrow passage ways. She did well with cognitive challenges during walking around the square. Backwards walking is the most challenging. Danett continues to demonstrate potential for improvement and would benefit from continued skilled therapy to address her ongoing balance and strength impairments.    EVAL: Patient is a 83 y.o. female who was seen today for physical therapy evaluation and treatment for muscles spasms secondary to her scoliosis and for balance deficits secondary to PD. She also reports new onset of R hip pain 5 days ago. The back spasms and hip pain are limiting her ability to walk, perform sit to stand, and perform standing kitchen and bathroom chores. She denies any falls in the past 6 months, but reports that her balance is not good. She uses a walker intermittently, mainly at the EOD and if she gets up during the night. She has deficits in flexibility and strength as well as posture and balance. She will benefit from skilled PT to address these deficits.  She experienced back spasm when attempting to do a supine hip stretch, so initial trial of DN performed on R QL with good twitch response. Patient reported decreased pain at end of session and was able to move without spasm.  OBJECTIVE IMPAIRMENTS:  Abnormal gait, decreased activity tolerance, decreased balance, decreased ROM, decreased strength, increased muscle spasms, impaired flexibility, postural dysfunction, and pain.   ACTIVITY LIMITATIONS: bending, sitting, standing, squatting, sleeping, stairs, transfers, bed mobility, bathing, dressing, hygiene/grooming, and locomotion level  PARTICIPATION LIMITATIONS: meal prep, cleaning, laundry, shopping, and community activity  PERSONAL FACTORS: Age, Fitness, Time since onset of injury/illness/exacerbation, and 3+ comorbidities: OP, PD, scoliosis, HTN are also affecting patient's functional outcome.   REHAB POTENTIAL: Good  CLINICAL DECISION MAKING: Unstable/unpredictable  EVALUATION COMPLEXITY: High   GOALS: Goals reviewed with patient? Yes  SHORT TERM GOALS: Target date: 04/28/24 Decreased back spasms by >= 50% to improve QOL Baseline: Goal status: IN PROGRESS 25% 5/6  2.  Decreased hip pain by 50% with steps, transfers and walking. Baseline:  Goal status: MET  3.  Balance assessment completed at second visit and goals updated. Baseline:  Goal status: MET   LONG TERM GOALS: Target date: 07/27/24  Patient able to perform ADLS without back spasms. Baseline:  Goal status: MET 06/01/24  2.  Pt able to walk and climb stairs without R hip pain. Baseline: 05/11/24: 75% better  07/05/24: 90% better Goal status: MET   3.  Improved 5xSTS by 5 seconds demonstrating improved strength and  decreasing risk for falls. Baseline: 25.08 seconds. Goal status: MET 04/20/24 13.35 sec  4.  Improved BERG score by 8 points showing functional improvement Baseline: 43/56 05/14/24: 48/56 06/01/24  47/56 Goal status: IN PROGRESS  5.  Improve PSFS by 2-3 points  Baseline: see objective Goal status: MET 05/11/24  6.  Patient to demonstrate improved balance and hip strength by being able to enter in/out of her front and back doors safely without LOB. Baseline: Goal status: IN PROGRESS 25%  BETTER  7. Patient able to walk in her bathroom and perform toilet transfers with no LOB.  Baseline:  Goal status: MET 07/15/24  8.  Patient able to clear 6 inch obstacles forward and laterally using her SPC or one UE support demonstrating improved SLS time and balance.  Baseline:  Goal status: IN PROGRESS   9.  Decreased TUG score to 11.3 sec with SPC demonstrating decreased risk for falls.  Baseline:  07/07/24: 16 sec Goal status: IN PROGRESS 07/15/24 12.13 SEC    PLAN:  PT FREQUENCY: 2x/week  PT DURATION: 8 weeks  PLANNED INTERVENTIONS: 97164- PT Re-evaluation, 97110-Therapeutic exercises, 97530- Therapeutic activity, 97112- Neuromuscular re-education, 97535- Self Care, [id number]- Manual therapy, [phone]- Gait training, [phone]- Electrical stimulation (unattended), [phone]- Ionotophoresis 4mg /ml Dexamethasone , Patient/Family education, Balance training, Stair training, Taping, Dry Needling, Joint mobilization, Cryotherapy, and Moist heat  PLAN FOR NEXT SESSION: Check BERG, distractions during gait to challenge gait pattern. Continue figure 8's and narrow spaced areas, backwards walking.  Big stepping activities (dots, over blocks, step ups, etc). Focus on standing up and overs to strengthen hips for clearing entryway at home, SLS activities to increase stance time (bil), Side stepping and low sit to stand transfers for help with commode transfers. PRECAUTION: OSTEOPOROSIS.    Mliss Cummins, PT  07/15/2024, 1:45 PM

## 2024-07-15 ENCOUNTER — Ambulatory Visit: Admitting: Physical Therapy

## 2024-07-15 DIAGNOSIS — R252 Cramp and spasm: Secondary | ICD-10-CM

## 2024-07-15 DIAGNOSIS — R2689 Other abnormalities of gait and mobility: Secondary | ICD-10-CM | POA: Diagnosis not present

## 2024-07-15 DIAGNOSIS — M25551 Pain in right hip: Secondary | ICD-10-CM

## 2024-07-15 DIAGNOSIS — R2681 Unsteadiness on feet: Secondary | ICD-10-CM

## 2024-07-15 DIAGNOSIS — M5459 Other low back pain: Secondary | ICD-10-CM

## 2024-07-18 ENCOUNTER — Encounter: Payer: Self-pay | Admitting: Physician Assistant

## 2024-07-19 NOTE — Therapy (Signed)
 OUTPATIENT PHYSICAL THERAPY LUMBAR AND LE TREATMENT   Patient Name: Chelsey Estes MRN: [id number] DOB:September 09, 1941, 83 y.o., female Today's Date: 07/20/2024  END OF SESSION:  PT End of Session - 07/20/24 1320     Visit Number 25    Date for PT Re-Evaluation 07/27/24    Authorization Type MCR    Progress Note Due on Visit 34    PT Start Time 1318    PT Stop Time 1400    PT Time Calculation (min) 42 min    Activity Tolerance Patient tolerated treatment well    Behavior During Therapy WFL for tasks assessed/performed            Past Medical History:  Diagnosis Date   Anxiety    Arthritis    Depression    Glaucoma    History of kidney stones    7 lithrotripsy   Hyperlipidemia    Hypertension    Parkinson disease (HCC)    Past Surgical History:  Procedure Laterality Date   ABDOMINAL HYSTERECTOMY     ANTERIOR (CYSTOCELE) AND POSTERIOR REPAIR (RECTOCELE) WITH XENFORM GRAFT AND SACROSPINOUS FIXATION     CATARACT EXTRACTION, BILATERAL     INGUINAL HERNIA REPAIR Left    kidney stones     LUMBAR LAMINECTOMY/DECOMPRESSION MICRODISCECTOMY Right 10/12/2021   Procedure: Right Lumbar four-five Laminectomy for facet/synovial cyst;  Surgeon: Joshua Alm RAMAN, MD;  Location: [facility] OR;  Service: Neurosurgery;  Laterality: Right;   Patient Active Problem List   Diagnosis Date Noted   Dysuria 02/25/2024   Leukocytes in urine 02/25/2024   Pruritus 10/17/2023   Heart palpitations 07/14/2023   History of hypokalemia 07/14/2023   Vitamin D  deficiency 12/02/2022   Kidney stone 11/22/2022   Tinnitus of left ear 07/03/2022   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 07/03/2022   Decreased GFR 04/17/2022   Medication management 04/16/2022   Synovial cyst of lumbar facet joint 09/19/2021   DDD (degenerative disc disease), lumbar 04/11/2021   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 03/09/2021   Palpitations 09/05/2020   SOB (shortness of  breath) 09/05/2020   Chest pain 09/05/2020   Mixed hyperlipidemia 06/06/2020   Primary insomnia 06/06/2020   Primary parkinsonism (HCC) 10/26/2019   Osteoporosis 09/15/2019   Blister of great toe of left foot 08/19/2019   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   Moderate episode of recurrent major depressive disorder (HCC) 02/09/2019   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 02/09/2019   Shuffling gait 02/09/2019   Balance problems 02/09/2019   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   Orthostatic hypotension 02/01/2019   Dizziness 02/01/2019   Primary osteoarthritis of right foot 09/25/2018   Snoring 09/25/2018   Night terrors, adult 09/25/2018   Multiple falls 09/25/2018   Metatarsalgia of left foot 08/20/2018   Chronic foot pain, right 08/20/2018   Left hip pain 04/10/2017   History of shingles 04/08/2017   Adenomatous polyp 04/08/2017   Pure hypercholesterolemia 04/08/2017   History of kidney stones 04/08/2017   Essential hypertension 04/08/2017   Depression, recurrent (HCC) 04/08/2017   Anxiety 04/08/2017   Greater trochanteric bursitis of left hip 04/08/2017    PCP: Antoniette Vermell CROME, PA-C   REFERRING PROVIDER: Evonnie Asberry RAMAN, DO   REFERRING DIAG:  G20.A1 (ICD-10-CM) - Parkinson's disease without dyskinesia or fluctuating manifestations (HCC)  M41.9 (ICD-10-CM) - Scoliosis, unspecified scoliosis type, unspecified spinal region    THERAPY DIAG:  Pain in right hip  Cramp and spasm  Unsteadiness on feet  Other low back pain  Other abnormalities of gait and mobility  Other symptoms and signs involving the nervous system  Rationale for Evaluation and Treatment: Rehabilitation  ONSET DATE: one year  SUBJECTIVE:   SUBJECTIVE STATEMENT:  I didn't sleep well last night.  Emptied the dishwasher last night and my back was hard as a rock    PERTINENT HISTORY: Osteoporosis (cannot take meds), PD, R L 4/5 laminectomy, HTN, scoliosis, h/o falls PAIN:  Are you having  pain? Yes: NPRS scale: 0 today up to 8/10 Pain location: R lumbar Pain description: stabbing spasm Aggravating factors: loading dishwasher, standing in bathroom Relieving factors: sitting  Are you having pain? Yes: NPRS scale: 0/10 Pain location: R thigh pain Pain description: soreness Aggravating factors: sitting over 2 hours Relieving factors: walking   PRECAUTIONS: Fall and Other: OSTEOPOROSIS  RED FLAGS: None   WEIGHT BEARING RESTRICTIONS: No  FALLS:  Has patient fallen in last 6 months? 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  DIAGNOSTIC FINDINGS: none  PATIENT SURVEYS:  The Patient-Specific Functional Scale  Initial:  I am going to ask you to identify up to 3 important activities that you are unable to do or are having difficulty with as a result of this problem.  Today are there any activities that you are unable to do or having difficulty with because of this?  (Patient shown scale and patient rated each activity)  Follow up: When you first came in you had difficulty performing these activities.  Today do you still have difficulty?  Patient-Specific activity scoring scheme (Point to one number):  0 1 2 3 4 5 6 7 8 9  10 Unable                                                                                                          Able to perform To perform                                                                                                    activity at the same Activity         Level as before  Injury or problem  Activity       Cooking/kitchen chores                                                                          Initial:      0                 follow up: 8 on 5/27  2.         Walking                                                                      Initial:         4              follow up: 7 on 5/27  3.         Sit to stand                                                                    Initial:        4               follow up: 7 on 5/27     COGNITION: Overall cognitive status: Within functional limits for tasks assessed     SENSATION: Not tested  MUSCLE LENGTH: B piriformis and HS tightness, R QL and lumbar  POSTURE: rounded shoulders, forward head, flexed trunk , and left thoracic R lumbar scoliosis (convexity)  PALPATION: Marked pain R QL, and R gluteals near greater trochanter  LUMBAR ROM:   Active  A/PROM  eval  Flexion WFL  Extension WFL  Right lateral flexion   Left lateral flexion   Right rotation NT  Left rotation NT   (Blank rows = not tested)   LOWER EXTREMITY ROM: WFL for tasks assessed  Active ROM Right eval Left eval  Hip flexion    Hip extension    Hip abduction    Hip adduction    Hip internal rotation 15* 20  Hip external rotation    Knee flexion    Knee extension    Ankle dorsiflexion    Ankle plantarflexion    Ankle inversion    Ankle eversion     (Blank rows = not tested) * cramping  LOWER EXTREMITY MMT: tested in sitting  MMT Right eval Left eval Right 6/17 Left  6/17  Hip flexion 4 4 4 4   Hip extension      Hip abduction 4 4+ 5 in sitting 5  in sitting  Hip adduction 5 5    Hip internal rotation      Hip external rotation      Knee flexion   5 4+  Knee extension 4+ 4+ 4+ 4  Ankle dorsiflexion 4+ 4+ 5 5  Ankle plantarflexion      Ankle inversion      Ankle eversion       (Blank rows = not tested)   FUNCTIONAL TESTS:  5 times sit  to stand: 25.08 sec  04/20/24  13.35 sec   07/15/24 TUG 12.13 sec  06/24/24 Negative hip scour R Negaive pain with hip PROM Negative pain with standing weight shift onto R LE No pain with MMT of hip and knee, but pain with active hip ABDuction in S/L Pain with ambulation   [facility] Adult PT Treatment:                                                DATE:07/20/2024  Therapeutic Exercise: Seated: Hip abd + green TB x15 Unilateral hip ABD R with no band x 10 LAQ + 3#AW 1x10, 1x 5, marching x 5 ea (some left hip tenderness)  Therapeutic Activity: gait belt/SBA with all TA PWR moves in standing - trunk rotation with hand clap, Weight shift reaches, step outs laterally with UE reach x 10  Side stepping over blue foam beam with green tband around waist to simulate door pressure - moved to Room 1 doorway and practiced with door pushing against patient multiple reps Agility ladder       [facility] Adult PT Treatment:                                                DATE: 07/15/2024 Therapeutic Exercise: STS + overhead reach in standing x 10, then with bil shoulder ext and chest forward x 10 Seated: Hip abd + green TB x15 LAQ + 3#AW 2x10 Therapeutic Activity: gait belt/SBA with all TA  Big Walking arms and legs - longer strides and working on gait swing PWR moves in standing - trunk rotation with hand clap, step outs laterally with UE reach x 10  Walk around the square - with cognitive challenges  Zig Zag Drill with cones  Figure 8 - (2 circles set up with cones) - more challenging with left foot near circle TUG/goals assessed     [facility] Adult PT Treatment:                                                DATE: 07/13/2024 Therapeutic Exercise: STS + overhead reach in standing  Side step with big arm reach to the side Seated: Hip abd + green TB x15 Ankle eversion B Therapeutic Activity: Cone weaving with SPC multiple cones narrow spaced and wide spaced -CGA Self Care: discussion of use of  rhythm or counting to avoid freezing of gait especially when approaching bed.  Manual Therapy: Trigger Point Dry Needling  Subsequent Treatment: Instructions reviewed, if requested by the patient, prior to subsequent dry needling treatment.   Patient Verbal Consent Given: Yes Education Handout Provided: Previously Provided Muscles Treated: R QL and gluteus medius  Electrical Stimulation Performed: No Treatment Response/Outcome: Utilized skilled palpation to identify bony landmarks and trigger points.  Able to illicit twitch response and muscle elongation.  Soft tissue mobilization  to muscles needled to further promote tissue elongation and decreased pain.       [facility] Adult PT Treatment:                                                DATE: 07/07/2024 Therapeutic Exercise: STS + overhead reach in standing  Side step with big arm reach to the side Seated: Hip abd + green TB x15 LAQ + 3#AW 2x10 Therapeutic Activity: 6 black stepper: side step off/on --> straddle stepping on/off Side stepping over blue foam beam Side stepping over yoga block with SPC (SBA) TUG update Cone weaving    [facility] Adult PT Treatment:                                                DATE: 07/05/2024 Therapeutic Exercise: Seated stretch over green bolster Standing stretch for trunk elongation S/L stretch over green bolster Therapeutic Activity: Stepping over low objects --> focus on AD placement with Rt foot stepping over & setting up closer to object   [facility] Adult PT Treatment:                                                DATE: 07/01/2024 Neuromuscular re-ed: Resisted walking 5# then with red tband (attempted with matrix but too difficult) - close SBA and near counter for UE support as needed Therapeutic Activity: Resisted lateral step ups 6 inch L only with PT pushing against pt to simulate storm door pressure Step taps to blue step with gait belt difficult on L side Curb work - [facility] and obstacle course  including wide foam balance beam Reverse walking - pt does at home for short distance in bedroom between bed and chest Reviewed how she uses her RW with toileting There ex: Nustep L5 x 5 min PT present to discuss status    [facility] Adult PT Treatment:                                                DATE: 06/24/2024 Self care: DN education review; MCR waiver signed Manual Therapy: Trigger Point Dry Needling  Subsequent Treatment: Instructions reviewed, if requested by the patient, prior to subsequent dry needling treatment.   Patient Verbal Consent Given: Yes Education Handout Provided: Previously Provided Muscles Treated: R QL and gluteus medius Electrical Stimulation Performed: No Treatment Response/Outcome: Utilized skilled palpation to identify bony landmarks and trigger points.  Able to illicit twitch response and muscle elongation.  Soft tissue mobilization to muscles needled to further promote tissue elongation and decreased pain.    Therapeutic Activities: Assessed R LE (see objective)   GAIT: Distance walked: 20 Assistive device utilized: None Level of assistance: Complete Independence Comments: decreased heel strike, flexed knees and hips, short step length    PATIENT EDUCATION:  Education details: Updated HEP  Person educated: Patient Education method: Explanation, Demonstration, and Handouts Education comprehension: verbalized understanding and returned demonstration  HOME EXERCISE PROGRAM: Access Code:  [email or web address] URL: [email or web address] Date: 06/14/2024 Prepared by: Lamarr Price  Exercises - Standing Lumbar Extension with Counter  - 3 x daily - 7 x weekly - 1 sets - 10 reps - Supine Figure 4 Piriformis Stretch  - 1 x daily - 7 x weekly - 3 sets - 10 reps - Supine Lower Trunk Rotation  - 1 x daily - 7 x weekly - 3 sets - 10 reps - QL Stretch Supine  - 1 x daily - 7 x weekly - 3 sets - 10 reps - Seated Isometric Hip Abduction with Belt  - 1 x  daily - 7 x weekly - 1-2 sets - 10 reps - 5 sec hold - Wall Push Up  - 1 x daily - 3 x weekly - 2 sets - 10 reps - Tandem Walking with Counter Support  - 1 x daily - 7 x weekly - 3 sets - 10 reps - Heel Walking with Counter Support  - 1 x daily - 7 x weekly - 3 sets - 10 reps - Seated Hip Abduction with Resistance  - 1 x daily - 7 x weekly - 3 sets - 10 reps  ASSESSMENT:  CLINICAL IMPRESSION:  Patient concerned about not seeing the same PT after today, so we worked for some time on scheduling for f/u visits. POC discussed and likelihood of re-certifying patient for gait and balance deficits. We then focused on lateral step overs to meet her functional goal. Patient demonstrated very good control and stability when performed in doorway. She admits that she is a little more anxious when doing it at home. We also discussed pt communicating to [employer] person about placing deliveries in a chair outside the door rather than on the porch to limit her bending and twisting. She did well with agility ladder today and would benefit from more of this with cognitive challenges. BERG should be assessed next visit as well.   EVAL: Patient is a 83 y.o. female who was seen today for physical therapy evaluation and treatment for muscles spasms secondary to her scoliosis and for balance deficits secondary to PD. She also reports new onset of R hip pain 5 days ago. The back spasms and hip pain are limiting her ability to walk, perform sit to stand, and perform standing kitchen and bathroom chores. She denies any falls in the past 6 months, but reports that her balance is not good. She uses a walker intermittently, mainly at the EOD and if she gets up during the night. She has deficits in flexibility and strength as well as posture and balance. She will benefit from skilled PT to address these deficits.  She experienced back spasm when attempting to do a supine hip stretch, so initial trial of DN performed on R QL with good  twitch response. Patient reported decreased pain at end of session and was able to move without spasm.  OBJECTIVE IMPAIRMENTS: Abnormal gait, decreased activity tolerance, decreased balance, decreased ROM, decreased strength, increased muscle spasms, impaired flexibility, postural dysfunction, and pain.   ACTIVITY LIMITATIONS: bending, sitting, standing, squatting, sleeping, stairs, transfers, bed mobility, bathing, dressing, hygiene/grooming, and locomotion level  PARTICIPATION LIMITATIONS: meal prep, cleaning, laundry, shopping, and community activity  PERSONAL FACTORS: Age, Fitness, Time since onset of injury/illness/exacerbation, and 3+ comorbidities: OP, PD, scoliosis, HTN are also affecting patient's functional outcome.   REHAB POTENTIAL: Good  CLINICAL DECISION MAKING: Unstable/unpredictable  EVALUATION COMPLEXITY: High   GOALS: Goals reviewed with patient? Yes  SHORT TERM GOALS: Target  date: 04/28/24 Decreased back spasms by >= 50% to improve QOL Baseline: Goal status: IN PROGRESS 25% 5/6  2.  Decreased hip pain by 50% with steps, transfers and walking. Baseline:  Goal status: MET  3.  Balance assessment completed at second visit and goals updated. Baseline:  Goal status: MET   LONG TERM GOALS: Target date: 07/27/24  Patient able to perform ADLS without back spasms. Baseline:  Goal status: MET 06/01/24  2.  Pt able to walk and climb stairs without R hip pain. Baseline: 05/11/24: 75% better  07/05/24: 90% better Goal status: MET   3.  Improved 5xSTS by 5 seconds demonstrating improved strength and decreasing risk for falls. Baseline: 25.08 seconds. Goal status: MET 04/20/24 13.35 sec  4.  Improved BERG score by 8 points showing functional improvement Baseline: 43/56 05/14/24: 48/56 06/01/24  47/56 Goal status: IN PROGRESS  5.  Improve PSFS by 2-3 points  Baseline: see objective Goal status: MET 05/11/24  6.  Patient to demonstrate improved balance and hip  strength by being able to enter in/out of her front and back doors safely without LOB. Baseline: Goal status: IN PROGRESS 25% BETTER  7. Patient able to walk in her bathroom and perform toilet transfers with no LOB.  Baseline:  Goal status: MET 07/15/24  8.  Patient able to clear 6 inch obstacles forward and laterally using her SPC or one UE support demonstrating improved SLS time and balance.  Baseline:  Goal status: IN PROGRESS   9.  Decreased TUG score to 11.3 sec with SPC demonstrating decreased risk for falls.  Baseline:  07/07/24: 16 sec Goal status: IN PROGRESS 07/15/24 12.13 SEC    PLAN:  PT FREQUENCY: 2x/week  PT DURATION: 8 weeks  PLANNED INTERVENTIONS: 97164- PT Re-evaluation, 97110-Therapeutic exercises, 97530- Therapeutic activity, 97112- Neuromuscular re-education, 97535- Self Care, [id number]- Manual therapy, [phone]- Gait training, [phone]- Electrical stimulation (unattended), [phone]- Ionotophoresis 4mg /ml Dexamethasone , Patient/Family education, Balance training, Stair training, Taping, Dry Needling, Joint mobilization, Cryotherapy, and Moist heat  PLAN FOR NEXT SESSION: Check BERG, distractions during gait to challenge gait pattern. Continue figure 8's and narrow spaced areas, backwards walking.  Big stepping activities (dots, over blocks, step ups, etc). Focus on standing up and overs to strengthen hips for clearing entryway at home, SLS activities to increase stance time (bil), Side stepping and low sit to stand transfers for help with commode transfers. PRECAUTION: OSTEOPOROSIS.    Mliss Cummins, PT  07/20/2024, 4:09 PM

## 2024-07-19 NOTE — Telephone Encounter (Signed)
 Lets see if we can get her in for nurse visit.

## 2024-07-20 ENCOUNTER — Encounter: Payer: Self-pay | Admitting: Physical Therapy

## 2024-07-20 ENCOUNTER — Ambulatory Visit: Attending: Neurology | Admitting: Physical Therapy

## 2024-07-20 DIAGNOSIS — M25551 Pain in right hip: Secondary | ICD-10-CM | POA: Insufficient documentation

## 2024-07-20 DIAGNOSIS — M5459 Other low back pain: Secondary | ICD-10-CM | POA: Diagnosis present

## 2024-07-20 DIAGNOSIS — R2689 Other abnormalities of gait and mobility: Secondary | ICD-10-CM | POA: Diagnosis present

## 2024-07-20 DIAGNOSIS — R29818 Other symptoms and signs involving the nervous system: Secondary | ICD-10-CM | POA: Insufficient documentation

## 2024-07-20 DIAGNOSIS — R2681 Unsteadiness on feet: Secondary | ICD-10-CM | POA: Diagnosis present

## 2024-07-20 DIAGNOSIS — R252 Cramp and spasm: Secondary | ICD-10-CM | POA: Diagnosis present

## 2024-07-21 NOTE — Therapy (Signed)
 OUTPATIENT PHYSICAL THERAPY LUMBAR AND LE TREATMENT   Patient Name: Chelsey Estes MRN: [id number] DOB:04/21/1941, 83 y.o., female Today's Date: 07/22/2024  END OF SESSION:  PT End of Session - 07/22/24 1019     Visit Number 26    Date for PT Re-Evaluation 07/27/24    Authorization Type MCR    Progress Note Due on Visit 34    PT Start Time 1019    PT Stop Time 1100    PT Time Calculation (min) 41 min    Activity Tolerance Patient tolerated treatment well    Behavior During Therapy WFL for tasks assessed/performed             Past Medical History:  Diagnosis Date   Anxiety    Arthritis    Depression    Glaucoma    History of kidney stones    7 lithrotripsy   Hyperlipidemia    Hypertension    Parkinson disease (HCC)    Past Surgical History:  Procedure Laterality Date   ABDOMINAL HYSTERECTOMY     ANTERIOR (CYSTOCELE) AND POSTERIOR REPAIR (RECTOCELE) WITH XENFORM GRAFT AND SACROSPINOUS FIXATION     CATARACT EXTRACTION, BILATERAL     INGUINAL HERNIA REPAIR Left    kidney stones     LUMBAR LAMINECTOMY/DECOMPRESSION MICRODISCECTOMY Right 10/12/2021   Procedure: Right Lumbar four-five Laminectomy for facet/synovial cyst;  Surgeon: Joshua Alm RAMAN, MD;  Location: [facility] OR;  Service: Neurosurgery;  Laterality: Right;   Patient Active Problem List   Diagnosis Date Noted   Dysuria 02/25/2024   Leukocytes in urine 02/25/2024   Pruritus 10/17/2023   Heart palpitations 07/14/2023   History of hypokalemia 07/14/2023   Vitamin D  deficiency 12/02/2022   Kidney stone 11/22/2022   Tinnitus of left ear 07/03/2022   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 07/03/2022   Decreased GFR 04/17/2022   Medication management 04/16/2022   Synovial cyst of lumbar facet joint 09/19/2021   DDD (degenerative disc disease), lumbar 04/11/2021   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 03/09/2021   Palpitations 09/05/2020   SOB (shortness  of breath) 09/05/2020   Chest pain 09/05/2020   Mixed hyperlipidemia 06/06/2020   Primary insomnia 06/06/2020   Primary parkinsonism (HCC) 10/26/2019   Osteoporosis 09/15/2019   Blister of great toe of left foot 08/19/2019   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   Moderate episode of recurrent major depressive disorder (HCC) 02/09/2019   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 02/09/2019   Shuffling gait 02/09/2019   Balance problems 02/09/2019   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   Orthostatic hypotension 02/01/2019   Dizziness 02/01/2019   Primary osteoarthritis of right foot 09/25/2018   Snoring 09/25/2018   Night terrors, adult 09/25/2018   Multiple falls 09/25/2018   Metatarsalgia of left foot 08/20/2018   Chronic foot pain, right 08/20/2018   Left hip pain 04/10/2017   History of shingles 04/08/2017   Adenomatous polyp 04/08/2017   Pure hypercholesterolemia 04/08/2017   History of kidney stones 04/08/2017   Essential hypertension 04/08/2017   Depression, recurrent (HCC) 04/08/2017   Anxiety 04/08/2017   Greater trochanteric bursitis of left hip 04/08/2017    PCP: Antoniette Vermell CROME, PA-C   REFERRING PROVIDER: Antoniette Vermell CROME, PA-C   REFERRING DIAG:  G20.A1 (ICD-10-CM) - Parkinson's disease without dyskinesia or fluctuating manifestations (HCC)  M41.9 (ICD-10-CM) - Scoliosis, unspecified scoliosis type, unspecified spinal region    THERAPY DIAG:  Unsteadiness on feet  Other abnormalities of gait  and mobility  Other low back pain  Cramp and spasm  Pain in right hip  Rationale for Evaluation and Treatment: Rehabilitation  ONSET DATE: one year  SUBJECTIVE:   SUBJECTIVE STATEMENT:  I stayed home yesterday due to the rain. I'm afraid I'm going to fall.   PERTINENT HISTORY: Osteoporosis (cannot take meds), PD, R L 4/5 laminectomy, HTN, scoliosis, h/o falls PAIN:  Are you having pain? Yes: NPRS scale: 0 today up to 8/10 Pain location: R lumbar Pain  description: stabbing spasm Aggravating factors: loading dishwasher, standing in bathroom Relieving factors: sitting  Are you having pain? Yes: NPRS scale: 0/10 Pain location: R thigh pain Pain description: soreness Aggravating factors: sitting over 2 hours Relieving factors: walking   PRECAUTIONS: Fall and Other: OSTEOPOROSIS  RED FLAGS: None   WEIGHT BEARING RESTRICTIONS: No  FALLS:  Has patient fallen in last 6 months? 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  DIAGNOSTIC FINDINGS: none  PATIENT SURVEYS:  The Patient-Specific Functional Scale  Initial:  I am going to ask you to identify up to 3 important activities that you are unable to do or are having difficulty with as a result of this problem.  Today are there any activities that you are unable to do or having difficulty with because of this?  (Patient shown scale and patient rated each activity)  Follow up: When you first came in you had difficulty performing these activities.  Today do you still have difficulty?  Patient-Specific activity scoring scheme (Point to one number):  0 1 2 3 4 5 6 7 8 9  10 Unable                                                                                                          Able to perform To perform                                                                                                    activity at the same Activity         Level as before  Injury or  problem  Activity       Cooking/kitchen chores                                                                          Initial:      0                 follow up: 8 on 5/27  2.         Walking                                                                      Initial:         4              follow up: 7 on 5/27  3.         Sit to stand                                                                    Initial:        4               follow up: 7 on 5/27     COGNITION: Overall cognitive status: Within functional limits for tasks assessed     SENSATION: Not tested  MUSCLE LENGTH: B piriformis and HS tightness, R QL and lumbar  POSTURE: rounded shoulders, forward head, flexed trunk , and left thoracic R lumbar scoliosis (convexity)  PALPATION: Marked pain R QL, and R gluteals near greater trochanter  LUMBAR ROM:   Active  A/PROM  eval  Flexion WFL  Extension WFL  Right lateral flexion   Left lateral flexion   Right rotation NT  Left rotation NT   (Blank rows = not tested)   LOWER EXTREMITY ROM: WFL for tasks assessed  Active ROM Right eval Left eval  Hip flexion    Hip extension    Hip abduction    Hip adduction    Hip internal rotation 15* 20  Hip external rotation    Knee flexion    Knee extension    Ankle dorsiflexion    Ankle plantarflexion    Ankle inversion    Ankle eversion     (Blank rows = not tested) * cramping  LOWER EXTREMITY MMT: tested in sitting  MMT Right eval Left eval Right 6/17 Left  6/17  Hip flexion 4 4 4 4   Hip extension      Hip abduction 4 4+ 5 in sitting 5  in sitting  Hip adduction 5 5    Hip internal rotation      Hip external rotation      Knee flexion   5 4+  Knee extension 4+ 4+ 4+  4  Ankle dorsiflexion 4+ 4+ 5 5  Ankle plantarflexion      Ankle inversion      Ankle eversion       (Blank rows = not tested)   FUNCTIONAL TESTS:  5 times sit to stand: 25.08 sec  04/20/24  13.35 sec   07/15/24 TUG 12.13 sec    [facility] PT Assessment - 07/22/24 0001       Berg Balance Test   Sit to Stand Able to stand without using hands and stabilize independently    Standing Unsupported Able to stand safely 2 minutes    Sitting with Back Unsupported but Feet Supported on Floor or Stool Able to sit safely and securely 2 minutes    Stand to Sit Sits safely with minimal use of hands    Transfers Able to transfer safely, minor use of hands    Standing Unsupported with Eyes Closed Able to stand 10 seconds safely    Standing Unsupported with Feet Together Able to place feet together independently and stand 1 minute safely    From Standing, Reach Forward with Outstretched Arm Can reach forward >12 cm safely (5)    From Standing Position, Pick up Object from Floor Able to pick up shoe safely and easily    From Standing Position, Turn to Look Behind Over each Shoulder Looks behind from both sides and weight shifts well    Turn 360 Degrees Able to turn 360 degrees safely but slowly    Standing Unsupported, Alternately Place Feet on Step/Stool Able to stand independently and safely and complete 8 steps in 20 seconds    Standing Unsupported, One Foot in Front Able to place foot tandem independently and hold 30 seconds    Standing on One Leg Able to lift leg independently and hold equal to or more than 3 seconds    Total Score 51    Berg comment: mod risk for falls > 50%           06/24/24 Negative hip scour R Negaive pain with hip PROM Negative pain with standing weight shift onto R LE No pain with MMT of hip and knee, but pain with active hip ABDuction in S/L Pain with ambulation    [facility] Adult PT Treatment:                                                DATE:07/22/2024  Therapeutic Activity: gait belt/SBA with all TA PWR moves in standing - trunk rotation with hand clap, Weight shift reaches, step outs laterally with UE reach x 10  BERG 51/56 Worked on turning especially to the left using figure 8's and larger to  smaller circles encouraging full step length Agility ladder - various stepping patterns to challenge cognition. Also walking straight through ladder encouraging full step length and arm swing.  Gait - multiple laps around clinic having patient name cities in A to Z order       [facility] Adult PT Treatment:                                                DATE:07/20/2024  Therapeutic Exercise: Therapeutic Exercise: Seated: Hip abd + green TB x15 Unilateral hip ABD R  with no band x 10 LAQ + 3#AW 1x10, 1x 5, marching x 5 ea (some left hip tenderness) Therapeutic Activity: gait belt/SBA with all TA PWR moves in standing - trunk rotation with hand clap, Weight shift reaches, step outs laterally with UE reach x 10  Side stepping over blue foam beam with green tband around waist to simulate door pressure - moved to Room 1 doorway and practiced with door pushing against patient multiple reps Agility ladder side step in and then step fwd diagonally to outside next square was most challenging       [facility] Adult PT Treatment:                                                DATE: 07/15/2024 Therapeutic Exercise: STS + overhead reach in standing x 10, then with bil shoulder ext and chest forward x 10 Seated: Hip abd + green TB x15 LAQ + 3#AW 2x10 Therapeutic Activity: gait belt/SBA with all TA  Big Walking arms and legs - longer strides and working on gait swing PWR moves in standing - trunk rotation with hand clap, step outs laterally with UE reach x 10  Walk around the square - with cognitive challenges  Zig Zag Drill with cones  Figure 8 - (2 circles set up with cones) - more challenging with left foot near circle TUG/goals assessed     [facility] Adult PT Treatment:                                                DATE: 07/13/2024 Therapeutic Exercise: STS + overhead reach in standing  Side step with big arm reach to the side Seated: Hip abd + green TB x15 Ankle eversion B Therapeutic  Activity: Cone weaving with SPC multiple cones narrow spaced and wide spaced -CGA Self Care: discussion of use of rhythm or counting to avoid freezing of gait especially when approaching bed.  Manual Therapy: Trigger Point Dry Needling  Subsequent Treatment: Instructions reviewed, if requested by the patient, prior to subsequent dry needling treatment.   Patient Verbal Consent Given: Yes Education Handout Provided: Previously Provided Muscles Treated: R QL and gluteus medius  Electrical Stimulation Performed: No Treatment Response/Outcome: Utilized skilled palpation to identify bony landmarks and trigger points.  Able to illicit twitch response and muscle elongation.  Soft tissue mobilization to muscles needled to further promote tissue elongation and decreased pain.       [facility] Adult PT Treatment:                                                DATE: 07/07/2024 Therapeutic Exercise: STS + overhead reach in standing  Side step with big arm reach to the side Seated: Hip abd + green TB x15 LAQ + 3#AW 2x10 Therapeutic Activity: 6 black stepper: side step off/on --> straddle stepping on/off Side stepping over blue foam beam Side stepping over yoga block with SPC (SBA) TUG update Cone weaving      GAIT: Distance walked: 20 Assistive device utilized: None Level of assistance: Complete Independence Comments: decreased heel strike,  flexed knees and hips, short step length    PATIENT EDUCATION:  Education details: Updated HEP  Person educated: Patient Education method: Explanation, Demonstration, and Handouts Education comprehension: verbalized understanding and returned demonstration  HOME EXERCISE PROGRAM: Access Code: [email or web address] URL: [email or web address] Date: 06/14/2024 Prepared by: Lamarr Price  Exercises - Standing Lumbar Extension with Counter  - 3 x daily - 7 x weekly - 1 sets - 10 reps - Supine Figure 4 Piriformis Stretch  - 1 x daily - 7 x weekly  - 3 sets - 10 reps - Supine Lower Trunk Rotation  - 1 x daily - 7 x weekly - 3 sets - 10 reps - QL Stretch Supine  - 1 x daily - 7 x weekly - 3 sets - 10 reps - Seated Isometric Hip Abduction with Belt  - 1 x daily - 7 x weekly - 1-2 sets - 10 reps - 5 sec hold - Wall Push Up  - 1 x daily - 3 x weekly - 2 sets - 10 reps - Tandem Walking with Counter Support  - 1 x daily - 7 x weekly - 3 sets - 10 reps - Heel Walking with Counter Support  - 1 x daily - 7 x weekly - 3 sets - 10 reps - Seated Hip Abduction with Resistance  - 1 x daily - 7 x weekly - 3 sets - 10 reps  ASSESSMENT:  CLINICAL IMPRESSION:  Patient is making good progress with her LTG meeting her BERG goal today. She is still a moderate risk for falls and this goal should be modified at re-certification.  Gait and balance are her greatest deficits. She has difficulty with turns left>Right,. Still challenged with stepping over wider obstacles. She is challenged with diagonal patterns in the agility ladder and when pattern is not repeated for patient. Her gait is also affected by cognitive challenges. Patient continues to experience intermittent R QL spasm, but these are becoming less frequent. She continues to demonstrate potential for improvement and would benefit from continued skilled therapy to address impairments.     EVAL: Patient is a 83 y.o. female who was seen today for physical therapy evaluation and treatment for muscles spasms secondary to her scoliosis and for balance deficits secondary to PD. She also reports new onset of R hip pain 5 days ago. The back spasms and hip pain are limiting her ability to walk, perform sit to stand, and perform standing kitchen and bathroom chores. She denies any falls in the past 6 months, but reports that her balance is not good. She uses a walker intermittently, mainly at the EOD and if she gets up during the night. She has deficits in flexibility and strength as well as posture and balance. She  will benefit from skilled PT to address these deficits.  She experienced back spasm when attempting to do a supine hip stretch, so initial trial of DN performed on R QL with good twitch response. Patient reported decreased pain at end of session and was able to move without spasm.  OBJECTIVE IMPAIRMENTS: Abnormal gait, decreased activity tolerance, decreased balance, decreased ROM, decreased strength, increased muscle spasms, impaired flexibility, postural dysfunction, and pain.   ACTIVITY LIMITATIONS: bending, sitting, standing, squatting, sleeping, stairs, transfers, bed mobility, bathing, dressing, hygiene/grooming, and locomotion level  PARTICIPATION LIMITATIONS: meal prep, cleaning, laundry, shopping, and community activity  PERSONAL FACTORS: Age, Fitness, Time since onset of injury/illness/exacerbation, and 3+ comorbidities: OP, PD, scoliosis, HTN are also affecting patient's functional outcome.  REHAB POTENTIAL: Good  CLINICAL DECISION MAKING: Unstable/unpredictable  EVALUATION COMPLEXITY: High   GOALS: Goals reviewed with patient? Yes  SHORT TERM GOALS: Target date: 04/28/24 Decreased back spasms by >= 50% to improve QOL Baseline: Goal status: IN PROGRESS 25% 5/6  2.  Decreased hip pain by 50% with steps, transfers and walking. Baseline:  Goal status: MET  3.  Balance assessment completed at second visit and goals updated. Baseline:  Goal status: MET   LONG TERM GOALS: Target date: 07/27/24  Patient able to perform ADLS without back spasms. Baseline:  Goal status: MET 06/01/24  2.  Pt able to walk and climb stairs without R hip pain. Baseline: 05/11/24: 75% better  07/05/24: 90% better Goal status: MET   3.  Improved 5xSTS by 5 seconds demonstrating improved strength and decreasing risk for falls. Baseline: 25.08 seconds. Goal status: MET 04/20/24 13.35 sec  4.  Improved BERG score by 8 points showing functional improvement Baseline: 43/56 05/14/24:  48/56 06/01/24  47/56 07/22/24  51/56 Goal status: MET 07/22/24  5.  Improve PSFS by 2-3 points  Baseline: see objective Goal status: MET 05/11/24  6.  Patient to demonstrate improved balance and hip strength by being able to enter in/out of her front and back doors safely without LOB. Baseline: Goal status: IN PROGRESS 25% BETTER  7. Patient able to walk in her bathroom and perform toilet transfers with no LOB.  Baseline:  Goal status: MET 07/15/24  8.  Patient able to clear 6 inch obstacles forward and laterally using her SPC or one UE support demonstrating improved SLS time and balance.  Baseline:  Goal status: IN PROGRESS   9.  Decreased TUG score to 11.3 sec with SPC demonstrating decreased risk for falls.  Baseline:  07/07/24: 16 sec Goal status: IN PROGRESS 07/15/24 12.13 SEC    PLAN:  PT FREQUENCY: 2x/week  PT DURATION: 8 weeks  PLANNED INTERVENTIONS: 97164- PT Re-evaluation, 97110-Therapeutic exercises, 97530- Therapeutic activity, 97112- Neuromuscular re-education, 97535- Self Care, [id number]- Manual therapy, [phone]- Gait training, [phone]- Electrical stimulation (unattended), [phone]- Ionotophoresis 4mg /ml Dexamethasone , Patient/Family education, Balance training, Stair training, Taping, Dry Needling, Joint mobilization, Cryotherapy, and Moist heat  PLAN FOR NEXT SESSION: Re-cert (primarily for balance but keep back active as she still gets spasms) Modify BERG goal,  work on  distractions during gait to challenge gait pattern. Continue figure 8's and narrow spaced areas, backwards walking.  Big stepping activities (dots, over blocks, step ups, agility ladder, etc). Continue standing up and overs to strengthen hips for clearing entryway at home, PRECAUTION: OSTEOPOROSIS.    Mliss Cummins, PT  07/22/2024, 2:01 PM

## 2024-07-22 ENCOUNTER — Encounter: Payer: Self-pay | Admitting: Physical Therapy

## 2024-07-22 ENCOUNTER — Ambulatory Visit: Payer: Self-pay | Admitting: Physical Therapy

## 2024-07-22 DIAGNOSIS — R252 Cramp and spasm: Secondary | ICD-10-CM

## 2024-07-22 DIAGNOSIS — R2689 Other abnormalities of gait and mobility: Secondary | ICD-10-CM

## 2024-07-22 DIAGNOSIS — M5459 Other low back pain: Secondary | ICD-10-CM

## 2024-07-22 DIAGNOSIS — R2681 Unsteadiness on feet: Secondary | ICD-10-CM

## 2024-07-22 DIAGNOSIS — M25551 Pain in right hip: Secondary | ICD-10-CM

## 2024-07-26 ENCOUNTER — Other Ambulatory Visit: Payer: Self-pay | Admitting: Neurology

## 2024-07-26 DIAGNOSIS — G20A1 Parkinson's disease without dyskinesia, without mention of fluctuations: Secondary | ICD-10-CM

## 2024-07-26 DIAGNOSIS — G20B1 Parkinson's disease with dyskinesia, without mention of fluctuations: Secondary | ICD-10-CM

## 2024-07-27 ENCOUNTER — Ambulatory Visit

## 2024-07-27 DIAGNOSIS — M5459 Other low back pain: Secondary | ICD-10-CM

## 2024-07-27 DIAGNOSIS — R2681 Unsteadiness on feet: Secondary | ICD-10-CM

## 2024-07-27 DIAGNOSIS — R2689 Other abnormalities of gait and mobility: Secondary | ICD-10-CM

## 2024-07-27 DIAGNOSIS — M25551 Pain in right hip: Secondary | ICD-10-CM | POA: Diagnosis not present

## 2024-07-27 NOTE — Therapy (Signed)
 OUTPATIENT PHYSICAL THERAPY LUMBAR AND LE TREATMENT  RE-CERTIFICATION  Patient Name: Chelsey Estes MRN: [id number] DOB:13-Oct-1941, 83 y.o., female Today's Date: 07/27/2024  END OF SESSION:  PT End of Session - 07/27/24 1451     Visit Number 27    Number of Visits 33    Date for PT Re-Evaluation 09/11/24    Authorization Type MCR    Progress Note Due on Visit 34    PT Start Time 1451    PT Stop Time 1530    PT Time Calculation (min) 39 min    Activity Tolerance Patient tolerated treatment well    Behavior During Therapy WFL for tasks assessed/performed              Past Medical History:  Diagnosis Date   Anxiety    Arthritis    Depression    Glaucoma    History of kidney stones    7 lithrotripsy   Hyperlipidemia    Hypertension    Parkinson disease (HCC)    Past Surgical History:  Procedure Laterality Date   ABDOMINAL HYSTERECTOMY     ANTERIOR (CYSTOCELE) AND POSTERIOR REPAIR (RECTOCELE) WITH XENFORM GRAFT AND SACROSPINOUS FIXATION     CATARACT EXTRACTION, BILATERAL     INGUINAL HERNIA REPAIR Left    kidney stones     LUMBAR LAMINECTOMY/DECOMPRESSION MICRODISCECTOMY Right 10/12/2021   Procedure: Right Lumbar four-five Laminectomy for facet/synovial cyst;  Surgeon: Joshua Alm RAMAN, MD;  Location: [facility] OR;  Service: Neurosurgery;  Laterality: Right;   Patient Active Problem List   Diagnosis Date Noted   Dysuria 02/25/2024   Leukocytes in urine 02/25/2024   Pruritus 10/17/2023   Heart palpitations 07/14/2023   History of hypokalemia 07/14/2023   Vitamin D  deficiency 12/02/2022   Kidney stone 11/22/2022   Tinnitus of left ear 07/03/2022   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 07/03/2022   Decreased GFR 04/17/2022   Medication management 04/16/2022   Synovial cyst of lumbar facet joint 09/19/2021   DDD (degenerative disc disease), lumbar 04/11/2021   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 03/09/2021    Palpitations 09/05/2020   SOB (shortness of breath) 09/05/2020   Chest pain 09/05/2020   Mixed hyperlipidemia 06/06/2020   Primary insomnia 06/06/2020   Primary parkinsonism (HCC) 10/26/2019   Osteoporosis 09/15/2019   Blister of great toe of left foot 08/19/2019   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   Moderate episode of recurrent major depressive disorder (HCC) 02/09/2019   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 02/09/2019   Shuffling gait 02/09/2019   Balance problems 02/09/2019   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   Orthostatic hypotension 02/01/2019   Dizziness 02/01/2019   Primary osteoarthritis of right foot 09/25/2018   Snoring 09/25/2018   Night terrors, adult 09/25/2018   Multiple falls 09/25/2018   Metatarsalgia of left foot 08/20/2018   Chronic foot pain, right 08/20/2018   Left hip pain 04/10/2017   History of shingles 04/08/2017   Adenomatous polyp 04/08/2017   Pure hypercholesterolemia 04/08/2017   History of kidney stones 04/08/2017   Essential hypertension 04/08/2017   Depression, recurrent (HCC) 04/08/2017   Anxiety 04/08/2017   Greater trochanteric bursitis of left hip 04/08/2017    PCP: Antoniette Vermell CROME, PA-C   REFERRING PROVIDER: Evonnie Asberry RAMAN, DO   REFERRING DIAG:  G20.A1 (ICD-10-CM) - Parkinson's disease without dyskinesia or fluctuating manifestations (HCC)  M41.9 (ICD-10-CM) - Scoliosis, unspecified scoliosis type, unspecified spinal region    THERAPY DIAG:  Unsteadiness on feet  Other abnormalities of gait and mobility  Other low back pain  Rationale for Evaluation and Treatment: Rehabilitation  ONSET DATE: one year  SUBJECTIVE:   SUBJECTIVE STATEMENT:  Patient reports she has good days and bad days. Yesterday was a good day and she was able to walk without the cane. She still gets a lot of back pain. Mostly when she bends over and is relieved if she sits down and rest. Patient feels like her balance is better, but with walking  she doesn't pick her feet up. Still feels like she needs to work on her gait as she will still shuffle and freeze.    PERTINENT HISTORY: Osteoporosis (cannot take meds), PD, R L 4/5 laminectomy, HTN, scoliosis, h/o falls PAIN:  Are you having pain? Yes: NPRS scale: 0 today up to 10/10 Pain location: R lumbar Pain description: stabbing spasm Aggravating factors: loading dishwasher, standing in bathroom, repetitive bending Relieving factors: sitting  PRECAUTIONS: Fall and Other: OSTEOPOROSIS  RED FLAGS: None   WEIGHT BEARING RESTRICTIONS: No  FALLS:  Has patient fallen in last 6 months? 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  DIAGNOSTIC FINDINGS: none  PATIENT SURVEYS:  The Patient-Specific Functional Scale  Initial:  I am going to ask you to identify up to 3 important activities that you are unable to do or are having difficulty with as a result of this problem.  Today are there any activities that you are unable to do or having difficulty with because of this?  (Patient shown scale and patient rated each activity)  Follow up: When you first came in you had difficulty performing these activities.  Today do you still have difficulty?  Patient-Specific activity scoring scheme (Point to one number):  0 1 2 3 4 5 6 7 8 9  10 Unable                                                                                                          Able to perform To perform                                                                                                    activity at the  same Activity  Level as before                                                                                                                       Injury or problem  Activity       Cooking/kitchen chores                                                                          Initial:      0                 follow up: 8 on 5/27  2.         Walking                                                                      Initial:         4              follow up: 7 on 5/27  3.         Sit to stand                                                                    Initial:        4               follow up: 7 on 5/27    07/27/24: ABC score 18%   COGNITION: Overall cognitive status: Within functional limits for tasks assessed     SENSATION: Not tested  MUSCLE LENGTH: B piriformis and HS tightness, R QL and lumbar  POSTURE: rounded shoulders, forward head, flexed trunk , and left thoracic R lumbar scoliosis (convexity)  PALPATION: Marked pain R QL, and R gluteals near greater trochanter  LUMBAR ROM:   Active  A/PROM  eval  Flexion WFL  Extension WFL  Right lateral flexion   Left lateral flexion   Right rotation NT  Left rotation NT   (Blank rows = not tested)   LOWER EXTREMITY ROM: WFL for tasks assessed  Active ROM Right eval Left eval  Hip flexion    Hip extension    Hip abduction    Hip adduction    Hip internal rotation 15* 20  Hip external rotation    Knee flexion    Knee extension    Ankle dorsiflexion    Ankle plantarflexion    Ankle inversion    Ankle eversion     (Blank rows = not tested) * cramping  LOWER EXTREMITY MMT: tested in sitting  MMT Right eval Left eval Right 6/17 Left  6/17 07/27/24  Hip flexion 4 4 4 4    Hip extension       Hip abduction 4 4+ 5 in sitting 5  in sitting   Hip adduction 5 5     Hip internal rotation       Hip external rotation       Knee flexion   5 4+   Knee extension 4+ 4+ 4+ 4   Ankle dorsiflexion 4+ 4+ 5 5  4  bilateral   Ankle plantarflexion       Ankle inversion       Ankle eversion        (Blank rows = not tested)   FUNCTIONAL TESTS:  5 times sit to stand: 25.08 sec  04/20/24  13.35 sec   07/15/24 TUG 12.13 sec  07/27/24:  5 x STS: 22.5 seconds   Lifting: excessive trunk flexion     06/24/24 Negative hip scour R Negaive pain with hip PROM Negative pain with standing weight shift onto R LE No pain with MMT of hip and knee, but pain with active hip ABDuction in S/L Pain with ambulation   [facility] Adult PT Treatment:                                                DATE: 07/27/24 Therapeutic Exercise: Seated toe raises x 10  HEP review   Therapeutic Activity: Re-assessment to determine overall progress, educating patient on progress towards goals.  Sit to stand x 10  Walking with turning: left and right  Forward and lateral step overs yoga block x 5 each    [facility] Adult PT Treatment:                                                DATE:07/22/2024  Therapeutic Activity: gait belt/SBA with all TA PWR moves in standing - trunk rotation with hand clap, Weight shift reaches, step outs laterally with UE reach x 10  BERG 51/56 Worked on turning especially to the left using figure 8's and larger to smaller circles encouraging full step length Agility ladder - various stepping patterns to challenge cognition. Also walking straight through ladder encouraging full step length and arm swing.  Gait - multiple laps around clinic having patient name cities in A to Z order       [facility] Adult PT Treatment:                                                DATE:07/20/2024  Therapeutic Exercise: Therapeutic Exercise: Seated: Hip abd + green TB x15 Unilateral hip ABD R with no band x 10 LAQ + 3#AW 1x10, 1x 5, marching x 5 ea (some left hip tenderness) Therapeutic Activity: gait belt/SBA with  all TA PWR moves in standing - trunk rotation with hand clap, Weight shift reaches, step outs laterally with UE  reach x 10  Side stepping over blue foam beam with green tband around waist to simulate door pressure - moved to Room 1 doorway and practiced with door pushing against patient multiple reps Agility ladder side step in and then step fwd diagonally to outside next square was most challenging       [facility] Adult PT Treatment:                                                DATE: 07/15/2024 Therapeutic Exercise: STS + overhead reach in standing x 10, then with bil shoulder ext and chest forward x 10 Seated: Hip abd + green TB x15 LAQ + 3#AW 2x10 Therapeutic Activity: gait belt/SBA with all TA  Big Walking arms and legs - longer strides and working on gait swing PWR moves in standing - trunk rotation with hand clap, step outs laterally with UE reach x 10  Walk around the square - with cognitive challenges  Zig Zag Drill with cones  Figure 8 - (2 circles set up with cones) - more challenging with left foot near circle TUG/goals assessed     [facility] Adult PT Treatment:                                                DATE: 07/13/2024 Therapeutic Exercise: STS + overhead reach in standing  Side step with big arm reach to the side Seated: Hip abd + green TB x15 Ankle eversion B Therapeutic Activity: Cone weaving with SPC multiple cones narrow spaced and wide spaced -CGA Self Care: discussion of use of rhythm or counting to avoid freezing of gait especially when approaching bed.  Manual Therapy: Trigger Point Dry Needling  Subsequent Treatment: Instructions reviewed, if requested by the patient, prior to subsequent dry needling treatment.   Patient Verbal Consent Given: Yes Education Handout Provided: Previously Provided Muscles Treated: R QL and gluteus medius  Electrical Stimulation Performed: No Treatment Response/Outcome: Utilized skilled palpation to identify bony landmarks and trigger points.  Able to illicit twitch response and muscle elongation.  Soft tissue mobilization to muscles  needled to further promote tissue elongation and decreased pain.       [facility] Adult PT Treatment:                                                DATE: 07/07/2024 Therapeutic Exercise: STS + overhead reach in standing  Side step with big arm reach to the side Seated: Hip abd + green TB x15 LAQ + 3#AW 2x10 Therapeutic Activity: 6 black stepper: side step off/on --> straddle stepping on/off Side stepping over blue foam beam Side stepping over yoga block with SPC (SBA) TUG update Cone weaving      GAIT: Distance walked: 20 Assistive device utilized: None Level of assistance: Complete Independence Comments: decreased heel strike, flexed knees and hips, short step length    PATIENT EDUCATION:  Education details: HEP review   Person educated: Patient Education  method: Explanation Education comprehension: verbalized understanding  HOME EXERCISE PROGRAM: Access Code: [email or web address] URL: [email or web address] Date: 06/14/2024 Prepared by: Lamarr Price  Exercises - Standing Lumbar Extension with Counter  - 3 x daily - 7 x weekly - 1 sets - 10 reps - Supine Figure 4 Piriformis Stretch  - 1 x daily - 7 x weekly - 3 sets - 10 reps - Supine Lower Trunk Rotation  - 1 x daily - 7 x weekly - 3 sets - 10 reps - QL Stretch Supine  - 1 x daily - 7 x weekly - 3 sets - 10 reps - Seated Isometric Hip Abduction with Belt  - 1 x daily - 7 x weekly - 1-2 sets - 10 reps - 5 sec hold - Wall Push Up  - 1 x daily - 3 x weekly - 2 sets - 10 reps - Tandem Walking with Counter Support  - 1 x daily - 7 x weekly - 3 sets - 10 reps - Heel Walking with Counter Support  - 1 x daily - 7 x weekly - 3 sets - 10 reps - Seated Hip Abduction with Resistance  - 1 x daily - 7 x weekly - 3 sets - 10 reps  ASSESSMENT:  CLINICAL IMPRESSION:  Patient is making steady progress in PT for treatment of back pain related to scoliosis and balance deficits secondary to PD. She continues to have intermittent  back spasms especially with repetitive bending activity. She does exhibit aberrant lifting mechanics when demonstrating in clinic today. While her BERG score has improved her 5 x STS and TUG score continue to put her at an increased fall risk. She also exhibits impairments in clinic with dynamic gait activity (change in direction, stepping over obstacles) and scores very low confidence in her balance as rated on the ABC scale. She continues to demonstrate potential for improvement and would benefit from continued skilled PT to address lingering balance, gait, and strength deficits in order to optimize her function and reduce risk of future falls.   EVAL: Patient is a 83 y.o. female who was seen today for physical therapy evaluation and treatment for muscles spasms secondary to her scoliosis and for balance deficits secondary to PD. She also reports new onset of R hip pain 5 days ago. The back spasms and hip pain are limiting her ability to walk, perform sit to stand, and perform standing kitchen and bathroom chores. She denies any falls in the past 6 months, but reports that her balance is not good. She uses a walker intermittently, mainly at the EOD and if she gets up during the night. She has deficits in flexibility and strength as well as posture and balance. She will benefit from skilled PT to address these deficits.  She experienced back spasm when attempting to do a supine hip stretch, so initial trial of DN performed on R QL with good twitch response. Patient reported decreased pain at end of session and was able to move without spasm.  OBJECTIVE IMPAIRMENTS: Abnormal gait, decreased activity tolerance, decreased balance, decreased ROM, decreased strength, increased muscle spasms, impaired flexibility, postural dysfunction, and pain.   ACTIVITY LIMITATIONS: bending, sitting, standing, squatting, sleeping, stairs, transfers, bed mobility, bathing, dressing, hygiene/grooming, and locomotion  level  PARTICIPATION LIMITATIONS: meal prep, cleaning, laundry, shopping, and community activity  PERSONAL FACTORS: Age, Fitness, Time since onset of injury/illness/exacerbation, and 3+ comorbidities: OP, PD, scoliosis, HTN are also affecting patient's functional outcome.   REHAB POTENTIAL: Good  CLINICAL DECISION  MAKING: Unstable/unpredictable  EVALUATION COMPLEXITY: High   GOALS: Goals reviewed with patient? Yes  SHORT TERM GOALS: Target date: 04/28/24 Decreased back spasms by >= 50% to improve QOL 07/27/24: 25% improvement  Goal status: IN PROGRESS 25% 5/6  2.  Decreased hip pain by 50% with steps, transfers and walking. Baseline:  Goal status: MET  3.  Balance assessment completed at second visit and goals updated. Baseline:  Goal status: MET   LONG TERM GOALS: Target date: 09/11/24  Patient able to perform ADLS without back spasms. Baseline:  Goal status: MET 06/01/24  2.  Pt able to walk and climb stairs without R hip pain. Baseline: 05/11/24: 75% better  07/05/24: 90% better Goal status: MET   3.  Improved 5xSTS by 5 seconds demonstrating improved strength and decreasing risk for falls. Baseline: 25.08 seconds. Goal status: MET 04/20/24 13.35 sec  4.  Improved BERG score by 8 points showing functional improvement Baseline: 43/56 05/14/24: 48/56 06/01/24  47/56 07/22/24  51/56 Goal status: MET 07/22/24  5.  Improve PSFS by 2-3 points  Baseline: see objective Goal status: MET 05/11/24  6.  Patient to demonstrate improved balance and hip strength by being able to enter in/out of her front and back doors safely without LOB. Baseline: 07/27/24: able to step safely without LOB Goal status: MET  7. Patient able to walk in her bathroom and perform toilet transfers with no LOB.  Baseline:  Goal status: MET 07/15/24  8.  Patient able to clear 6 inch obstacles forward and laterally using her SPC or one UE support demonstrating improved SLS time and balance.  Baseline:   07/27/24: able to clear 6 inch with single UE support  Goal status: MET   9.  Decreased TUG score to 11.3 sec with SPC demonstrating decreased risk for falls.  Baseline:  07/07/24: 16 sec 07/15/24: 12.13 sec  07/27/24: 15 sec Goal status: IN PROGRESS   10. Patient will complete 5 x STS in </= 18 seconds to improve functional strength.   Baseline: see above  Goal status: NEW   11. Patient will improve ABC score by at least 15% to improve balance confidence.  Baseline: 18%  Goal Status: NEW  12. Patient will demonstrate proper hip hinge to reduce stress on her back with lifting activity.   Baseline: no knowledge  Goal Status: NEW    PLAN:  PT FREQUENCY: 1x/week  PT DURATION: 6 weeks  PLANNED INTERVENTIONS: [id number]- PT Re-evaluation, 97110-Therapeutic exercises, 97530- Therapeutic activity, [id number]- Neuromuscular re-education, 97535- Self Care, [id number]- Manual therapy, [id number]- Gait training, [phone]- Electrical stimulation (unattended), [phone]- Ionotophoresis 4mg /ml Dexamethasone , Patient/Family education, Balance training, Stair training, Taping, Dry Needling, Joint mobilization, Cryotherapy, and Moist heat  PLAN FOR NEXT SESSION:  Continue figure 8's and narrow spaced areas, backwards walking.  Big stepping activities (dots, over blocks, step ups, agility ladder, etc). Continue standing up and overs to strengthen hips for clearing entryway at home, PRECAUTION: OSTEOPOROSIS. Lifting mechanics. Incorporate PWR!    Winfield Caba, PT, DPT, ATC 07/27/24 5:11 PM

## 2024-08-03 ENCOUNTER — Telehealth: Payer: Self-pay | Admitting: Neurology

## 2024-08-03 ENCOUNTER — Other Ambulatory Visit: Payer: Self-pay | Admitting: Neurology

## 2024-08-03 DIAGNOSIS — G20B1 Parkinson's disease with dyskinesia, without mention of fluctuations: Secondary | ICD-10-CM

## 2024-08-03 DIAGNOSIS — G20A1 Parkinson's disease without dyskinesia, without mention of fluctuations: Secondary | ICD-10-CM

## 2024-08-03 NOTE — Telephone Encounter (Signed)
 I sent mychart message as well.

## 2024-08-03 NOTE — Telephone Encounter (Signed)
 Tried to call no answer at 3:25pm will send my chart message.

## 2024-08-03 NOTE — Telephone Encounter (Signed)
 Pt left a VM stating that she needs to speak with someone about her medication  Caribdopa levodopa 

## 2024-08-04 ENCOUNTER — Ambulatory Visit: Admitting: Physical Therapy

## 2024-08-04 ENCOUNTER — Encounter: Payer: Self-pay | Admitting: Physical Therapy

## 2024-08-04 ENCOUNTER — Other Ambulatory Visit: Payer: Self-pay

## 2024-08-04 DIAGNOSIS — M5459 Other low back pain: Secondary | ICD-10-CM

## 2024-08-04 DIAGNOSIS — M25551 Pain in right hip: Secondary | ICD-10-CM | POA: Diagnosis not present

## 2024-08-04 DIAGNOSIS — R2681 Unsteadiness on feet: Secondary | ICD-10-CM

## 2024-08-04 DIAGNOSIS — G20B1 Parkinson's disease with dyskinesia, without mention of fluctuations: Secondary | ICD-10-CM

## 2024-08-04 DIAGNOSIS — G20A1 Parkinson's disease without dyskinesia, without mention of fluctuations: Secondary | ICD-10-CM

## 2024-08-04 DIAGNOSIS — R2689 Other abnormalities of gait and mobility: Secondary | ICD-10-CM

## 2024-08-04 MED ORDER — CARBIDOPA-LEVODOPA 25-100 MG PO TABS: 1.0000 | ORAL_TABLET | Freq: Three times a day (TID) | ORAL | 0 refills | Status: DC

## 2024-08-04 NOTE — Therapy (Signed)
 OUTPATIENT PHYSICAL THERAPY LUMBAR AND LE TREATMENT  RE-CERTIFICATION  Patient Name: Chelsey Estes MRN: [id number] DOB:Dec 02, 1941, 83 y.o., female Today's Date: 08/04/2024  END OF SESSION:  PT End of Session - 08/04/24 1533     Visit Number 28    Number of Visits 33    Date for PT Re-Evaluation 09/11/24    Authorization Type MCR    Progress Note Due on Visit 34    PT Start Time 1400    PT Stop Time 1444    PT Time Calculation (min) 44 min    Activity Tolerance Patient tolerated treatment well    Behavior During Therapy WFL for tasks assessed/performed               Past Medical History:  Diagnosis Date   Anxiety    Arthritis    Depression    Glaucoma    History of kidney stones    7 lithrotripsy   Hyperlipidemia    Hypertension    Parkinson disease (HCC)    Past Surgical History:  Procedure Laterality Date   ABDOMINAL HYSTERECTOMY     ANTERIOR (CYSTOCELE) AND POSTERIOR REPAIR (RECTOCELE) WITH XENFORM GRAFT AND SACROSPINOUS FIXATION     CATARACT EXTRACTION, BILATERAL     INGUINAL HERNIA REPAIR Left    kidney stones     LUMBAR LAMINECTOMY/DECOMPRESSION MICRODISCECTOMY Right 10/12/2021   Procedure: Right Lumbar four-five Laminectomy for facet/synovial cyst;  Surgeon: Joshua Alm RAMAN, MD;  Location: [facility] OR;  Service: Neurosurgery;  Laterality: Right;   Patient Active Problem List   Diagnosis Date Noted   Dysuria 02/25/2024   Leukocytes in urine 02/25/2024   Pruritus 10/17/2023   Heart palpitations 07/14/2023   History of hypokalemia 07/14/2023   Vitamin D  deficiency 12/02/2022   Kidney stone 11/22/2022   Tinnitus of left ear 07/03/2022   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 07/03/2022   Decreased GFR 04/17/2022   Medication management 04/16/2022   Synovial cyst of lumbar facet joint 09/19/2021   DDD (degenerative disc disease), lumbar 04/11/2021   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  03/09/2021   Palpitations 09/05/2020   SOB (shortness of breath) 09/05/2020   Chest pain 09/05/2020   Mixed hyperlipidemia 06/06/2020   Primary insomnia 06/06/2020   Primary parkinsonism (HCC) 10/26/2019   Osteoporosis 09/15/2019   Blister of great toe of left foot 08/19/2019   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   Moderate episode of recurrent major depressive disorder (HCC) 02/09/2019   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 02/09/2019   Shuffling gait 02/09/2019   Balance problems 02/09/2019   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   Orthostatic hypotension 02/01/2019   Dizziness 02/01/2019   Primary osteoarthritis of right foot 09/25/2018   Snoring 09/25/2018   Night terrors, adult 09/25/2018   Multiple falls 09/25/2018   Metatarsalgia of left foot 08/20/2018   Chronic foot pain, right 08/20/2018   Left hip pain 04/10/2017   History of shingles 04/08/2017   Adenomatous polyp 04/08/2017   Pure hypercholesterolemia 04/08/2017   History of kidney stones 04/08/2017   Essential hypertension 04/08/2017   Depression, recurrent (HCC) 04/08/2017   Anxiety 04/08/2017   Greater trochanteric bursitis of left hip 04/08/2017    PCP: Antoniette Vermell CROME, PA-C   REFERRING PROVIDER: Evonnie Asberry RAMAN, DO   REFERRING DIAG:  G20.A1 (ICD-10-CM) - Parkinson's disease without dyskinesia or fluctuating manifestations (HCC)  M41.9 (ICD-10-CM) - Scoliosis, unspecified scoliosis type, unspecified spinal region    THERAPY  DIAG:  Unsteadiness on feet  Other abnormalities of gait and mobility  Other low back pain  Rationale for Evaluation and Treatment: Rehabilitation  ONSET DATE: one year  SUBJECTIVE:   SUBJECTIVE STATEMENT:  Patient reports her back continues to hurt with repetitive bending and that she will get a cramp in her Lt hip each morning and with quick movements.   PERTINENT HISTORY: Osteoporosis (cannot take meds), PD, R L 4/5 laminectomy, HTN, scoliosis, h/o falls PAIN:   Are you having pain? Yes: NPRS scale: 0 today up to 10/10 Pain location: R lumbar Pain description: stabbing spasm Aggravating factors: loading dishwasher, standing in bathroom, repetitive bending Relieving factors: sitting  PRECAUTIONS: Fall and Other: OSTEOPOROSIS  RED FLAGS: None   WEIGHT BEARING RESTRICTIONS: No  FALLS:  Has patient fallen in last 6 months? 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  DIAGNOSTIC FINDINGS: none  PATIENT SURVEYS:  The Patient-Specific Functional Scale  Initial:  I am going to ask you to identify up to 3 important activities that you are unable to do or are having difficulty with as a result of this problem.  Today are there any activities that you are unable to do or having difficulty with because of this?  (Patient shown scale and patient rated each activity)  Follow up: When you first came in you had difficulty performing these activities.  Today do you still have difficulty?  Patient-Specific activity scoring scheme (Point to one number):  0 1 2 3 4 5 6 7 8 9  10 Unable                                                                                                          Able to perform To perform                                                                                                    activity at the same Activity         Level as before  Injury or problem  Activity       Cooking/kitchen chores                                                                           Initial:      0                 follow up: 8 on 5/27  2.         Walking                                                                      Initial:         4              follow up: 7 on 5/27  3.         Sit to stand                                                                    Initial:        4               follow up: 7 on 5/27    07/27/24: ABC score 18%   COGNITION: Overall cognitive status: Within functional limits for tasks assessed     SENSATION: Not tested  MUSCLE LENGTH: B piriformis and HS tightness, R QL and lumbar  POSTURE: rounded shoulders, forward head, flexed trunk , and left thoracic R lumbar scoliosis (convexity)  PALPATION: Marked pain R QL, and R gluteals near greater trochanter  LUMBAR ROM:   Active  A/PROM  eval  Flexion WFL  Extension WFL  Right lateral flexion   Left lateral flexion   Right rotation NT  Left rotation NT   (Blank rows = not tested)   LOWER EXTREMITY ROM: WFL for tasks assessed  Active ROM Right eval Left eval  Hip flexion    Hip extension    Hip abduction    Hip adduction    Hip internal rotation 15* 20  Hip external rotation    Knee flexion    Knee extension    Ankle dorsiflexion    Ankle plantarflexion    Ankle inversion    Ankle eversion     (Blank rows = not tested) * cramping  LOWER EXTREMITY MMT: tested in sitting  MMT Right eval Left eval Right 6/17 Left  6/17 07/27/24  Hip flexion 4 4 4 4    Hip extension       Hip abduction 4 4+ 5 in sitting 5  in sitting   Hip adduction 5 5     Hip internal rotation       Hip external rotation  Knee flexion   5 4+   Knee extension 4+ 4+ 4+ 4   Ankle dorsiflexion 4+ 4+ 5 5 4  bilateral   Ankle plantarflexion       Ankle inversion       Ankle eversion        (Blank rows = not tested)   FUNCTIONAL TESTS:  5 times sit to stand: 25.08 sec  04/20/24  13.35 sec   07/15/24 TUG 12.13 sec  07/27/24:   5 x STS: 22.5 seconds   Lifting: excessive trunk flexion     06/24/24 Negative hip scour R Negaive pain with hip PROM Negative pain with standing weight shift onto R LE No pain with MMT of hip and knee, but pain with active hip ABDuction in S/L Pain with ambulation   [facility] Adult PT Treatment:                                                DATE: 08/04/24  Manual Therapy: STM Rt QL, lumbar paraspinals Trigger Point Dry Needling  Subsequent Treatment: Instructions provided previously at initial dry needling treatment.   Patient Verbal Consent Given: Yes Education Handout Provided: Previously Provided Muscles Treated: Rt QL Electrical Stimulation Performed: No Treatment Response/Outcome: twitch response   Neuromuscular re-ed: Gait with figure 8 turns Gait with stepping over obstacles Therapeutic Activity: Sit <> stand x 10 Picking up items off of ground with focus on improving form  [facility] Adult PT Treatment:                                                DATE: 07/27/24 Therapeutic Exercise: Seated toe raises x 10  HEP review   Therapeutic Activity: Re-assessment to determine overall progress, educating patient on progress towards goals.  Sit to stand x 10  Walking with turning: left and right  Forward and lateral step overs yoga block x 5 each    [facility] Adult PT Treatment:                                                DATE:07/22/2024  Therapeutic Activity: gait belt/SBA with all TA PWR moves in standing - trunk rotation with hand clap, Weight shift reaches, step outs laterally with UE reach x 10  BERG 51/56 Worked on turning especially to the left using figure 8's and larger to smaller circles encouraging full step length Agility ladder - various stepping patterns to challenge cognition. Also walking straight through ladder encouraging full step length and arm swing.  Gait - multiple laps around clinic having patient name cities in A to Z order       [facility] Adult PT  Treatment:                                                DATE:07/20/2024  Therapeutic Exercise: Therapeutic Exercise: Seated: Hip abd + green TB x15 Unilateral hip ABD R with no band x 10 LAQ + 3#AW 1x10,  1x 5, marching x 5 ea (some left hip tenderness) Therapeutic Activity: gait belt/SBA with all TA PWR moves in standing - trunk rotation with hand clap, Weight shift reaches, step outs laterally with UE reach x 10  Side stepping over blue foam beam with green tband around waist to simulate door pressure - moved to Room 1 doorway and practiced with door pushing against patient multiple reps Agility ladder side step in and then step fwd diagonally to outside next square was most challenging       [facility] Adult PT Treatment:                                                DATE: 07/15/2024 Therapeutic Exercise: STS + overhead reach in standing x 10, then with bil shoulder ext and chest forward x 10 Seated: Hip abd + green TB x15 LAQ + 3#AW 2x10 Therapeutic Activity: gait belt/SBA with all TA  Big Walking arms and legs - longer strides and working on gait swing PWR moves in standing - trunk rotation with hand clap, step outs laterally with UE reach x 10  Walk around the square - with cognitive challenges  Zig Zag Drill with cones  Figure 8 - (2 circles set up with cones) - more challenging with left foot near circle TUG/goals assessed     [facility] Adult PT Treatment:                                                DATE: 07/13/2024 Therapeutic Exercise: STS + overhead reach in standing  Side step with big arm reach to the side Seated: Hip abd + green TB x15 Ankle eversion B Therapeutic Activity: Cone weaving with SPC multiple cones narrow spaced and wide spaced -CGA Self Care: discussion of use of rhythm or counting to avoid freezing of gait especially when approaching bed.  Manual Therapy: Trigger Point Dry Needling  Subsequent Treatment: Instructions reviewed, if requested by the  patient, prior to subsequent dry needling treatment.   Patient Verbal Consent Given: Yes Education Handout Provided: Previously Provided Muscles Treated: R QL and gluteus medius  Electrical Stimulation Performed: No Treatment Response/Outcome: Utilized skilled palpation to identify bony landmarks and trigger points.  Able to illicit twitch response and muscle elongation.  Soft tissue mobilization to muscles needled to further promote tissue elongation and decreased pain.       [facility] Adult PT Treatment:                                                DATE: 07/07/2024 Therapeutic Exercise: STS + overhead reach in standing  Side step with big arm reach to the side Seated: Hip abd + green TB x15 LAQ + 3#AW 2x10 Therapeutic Activity: 6 black stepper: side step off/on --> straddle stepping on/off Side stepping over blue foam beam Side stepping over yoga block with SPC (SBA) TUG update Cone weaving      GAIT: Distance walked: 20 Assistive device utilized: None Level of assistance: Complete Independence Comments: decreased heel strike, flexed knees and hips, short step length  PATIENT EDUCATION:  Education details: HEP review   Person educated: Patient Education method: Explanation Education comprehension: verbalized understanding  HOME EXERCISE PROGRAM: Access Code: [email or web address] URL: [email or web address] Date: 06/14/2024 Prepared by: Lamarr Price  Exercises - Standing Lumbar Extension with Counter  - 3 x daily - 7 x weekly - 1 sets - 10 reps - Supine Figure 4 Piriformis Stretch  - 1 x daily - 7 x weekly - 3 sets - 10 reps - Supine Lower Trunk Rotation  - 1 x daily - 7 x weekly - 3 sets - 10 reps - QL Stretch Supine  - 1 x daily - 7 x weekly - 3 sets - 10 reps - Seated Isometric Hip Abduction with Belt  - 1 x daily - 7 x weekly - 1-2 sets - 10 reps - 5 sec hold - Wall Push Up  - 1 x daily - 3 x weekly - 2 sets - 10 reps - Tandem Walking with Counter  Support  - 1 x daily - 7 x weekly - 3 sets - 10 reps - Heel Walking with Counter Support  - 1 x daily - 7 x weekly - 3 sets - 10 reps - Seated Hip Abduction with Resistance  - 1 x daily - 7 x weekly - 3 sets - 10 reps  ASSESSMENT:  CLINICAL IMPRESSION: Pt continues with c/o back pain and states dry needling does help the pain. She is improving gait as long as she goes slowly. She continues to c/o difficulty bringing items into her house due to the storm door  EVAL: Patient is a 83 y.o. female who was seen today for physical therapy evaluation and treatment for muscles spasms secondary to her scoliosis and for balance deficits secondary to PD. She also reports new onset of R hip pain 5 days ago. The back spasms and hip pain are limiting her ability to walk, perform sit to stand, and perform standing kitchen and bathroom chores. She denies any falls in the past 6 months, but reports that her balance is not good. She uses a walker intermittently, mainly at the EOD and if she gets up during the night. She has deficits in flexibility and strength as well as posture and balance. She will benefit from skilled PT to address these deficits.  She experienced back spasm when attempting to do a supine hip stretch, so initial trial of DN performed on R QL with good twitch response. Patient reported decreased pain at end of session and was able to move without spasm.  OBJECTIVE IMPAIRMENTS: Abnormal gait, decreased activity tolerance, decreased balance, decreased ROM, decreased strength, increased muscle spasms, impaired flexibility, postural dysfunction, and pain.      GOALS: Goals reviewed with patient? Yes  SHORT TERM GOALS: Target date: 04/28/24 Decreased back spasms by >= 50% to improve QOL 07/27/24: 25% improvement  Goal status: IN PROGRESS 25% 5/6  2.  Decreased hip pain by 50% with steps, transfers and walking. Baseline:  Goal status: MET  3.  Balance assessment completed at second visit and  goals updated. Baseline:  Goal status: MET   LONG TERM GOALS: Target date: 09/11/24  Patient able to perform ADLS without back spasms. Baseline:  Goal status: MET 06/01/24  2.  Pt able to walk and climb stairs without R hip pain. Baseline: 05/11/24: 75% better  07/05/24: 90% better Goal status: MET   3.  Improved 5xSTS by 5 seconds demonstrating improved strength and decreasing risk for falls. Baseline: 25.08 seconds. Goal  status: MET 04/20/24 13.35 sec  4.  Improved BERG score by 8 points showing functional improvement Baseline: 43/56 05/14/24: 48/56 06/01/24  47/56 07/22/24  51/56 Goal status: MET 07/22/24  5.  Improve PSFS by 2-3 points  Baseline: see objective Goal status: MET 05/11/24  6.  Patient to demonstrate improved balance and hip strength by being able to enter in/out of her front and back doors safely without LOB. Baseline: 07/27/24: able to step safely without LOB Goal status: MET  7. Patient able to walk in her bathroom and perform toilet transfers with no LOB.  Baseline:  Goal status: MET 07/15/24  8.  Patient able to clear 6 inch obstacles forward and laterally using her SPC or one UE support demonstrating improved SLS time and balance.  Baseline:  07/27/24: able to clear 6 inch with single UE support  Goal status: MET   9.  Decreased TUG score to 11.3 sec with SPC demonstrating decreased risk for falls.  Baseline:  07/07/24: 16 sec 07/15/24: 12.13 sec  07/27/24: 15 sec Goal status: IN PROGRESS   10. Patient will complete 5 x STS in </= 18 seconds to improve functional strength.   Baseline: see above  Goal status: NEW   11. Patient will improve ABC score by at least 15% to improve balance confidence.  Baseline: 18%  Goal Status: NEW  12. Patient will demonstrate proper hip hinge to reduce stress on her back with lifting activity.   Baseline: no knowledge  Goal Status: NEW    PLAN:  PT FREQUENCY: 1x/week  PT DURATION: 6 weeks  PLANNED  INTERVENTIONS: [id number]- PT Re-evaluation, 97110-Therapeutic exercises, 97530- Therapeutic activity, [id number]- Neuromuscular re-education, 97535- Self Care, [id number]- Manual therapy, [phone]- Gait training, [phone]- Electrical stimulation (unattended), [phone]- Ionotophoresis 4mg /ml Dexamethasone , Patient/Family education, Balance training, Stair training, Taping, Dry Needling, Joint mobilization, Cryotherapy, and Moist heat  PLAN FOR NEXT SESSION:  Continue figure 8's and narrow spaced areas, backwards walking.  Big stepping activities (dots, over blocks, step ups, agility ladder, etc). Continue standing up and overs to strengthen hips for clearing entryway at home, PRECAUTION: OSTEOPOROSIS. Lifting mechanics. Incorporate PWR!    Darice Conine, PT,DPT08/20/253:34 PM

## 2024-08-10 ENCOUNTER — Encounter: Payer: Self-pay | Admitting: Rehabilitative and Restorative Service Providers"

## 2024-08-10 ENCOUNTER — Ambulatory Visit: Admitting: Rehabilitative and Restorative Service Providers"

## 2024-08-10 DIAGNOSIS — R2689 Other abnormalities of gait and mobility: Secondary | ICD-10-CM

## 2024-08-10 DIAGNOSIS — M25551 Pain in right hip: Secondary | ICD-10-CM | POA: Diagnosis not present

## 2024-08-10 DIAGNOSIS — R2681 Unsteadiness on feet: Secondary | ICD-10-CM

## 2024-08-10 DIAGNOSIS — M5459 Other low back pain: Secondary | ICD-10-CM

## 2024-08-10 NOTE — Therapy (Signed)
 OUTPATIENT PHYSICAL THERAPY LUMBAR AND LE TREATMENT   Patient Name: Chelsey Estes MRN: [id number] DOB:Dec 16, 1941, 83 y.o., female Today's Date: 08/10/2024  END OF SESSION:  PT End of Session - 08/10/24 1550     Visit Number 29    Number of Visits 33    Date for PT Re-Evaluation 09/11/24    Authorization Type MCR    Progress Note Due on Visit 34    PT Start Time 1150    PT Stop Time 1234    PT Time Calculation (min) 44 min    Activity Tolerance Patient tolerated treatment well    Behavior During Therapy WFL for tasks assessed/performed         Past Medical History:  Diagnosis Date   Anxiety    Arthritis    Depression    Glaucoma    History of kidney stones    7 lithrotripsy   Hyperlipidemia    Hypertension    Parkinson disease (HCC)    Past Surgical History:  Procedure Laterality Date   ABDOMINAL HYSTERECTOMY     ANTERIOR (CYSTOCELE) AND POSTERIOR REPAIR (RECTOCELE) WITH XENFORM GRAFT AND SACROSPINOUS FIXATION     CATARACT EXTRACTION, BILATERAL     INGUINAL HERNIA REPAIR Left    kidney stones     LUMBAR LAMINECTOMY/DECOMPRESSION MICRODISCECTOMY Right 10/12/2021   Procedure: Right Lumbar four-five Laminectomy for facet/synovial cyst;  Surgeon: Joshua Alm RAMAN, MD;  Location: [facility] OR;  Service: Neurosurgery;  Laterality: Right;   Patient Active Problem List   Diagnosis Date Noted   Dysuria 02/25/2024   Leukocytes in urine 02/25/2024   Pruritus 10/17/2023   Heart palpitations 07/14/2023   History of hypokalemia 07/14/2023   Vitamin D  deficiency 12/02/2022   Kidney stone 11/22/2022   Tinnitus of left ear 07/03/2022   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 07/03/2022   Decreased GFR 04/17/2022   Medication management 04/16/2022   Synovial cyst of lumbar facet joint 09/19/2021   DDD (degenerative disc disease), lumbar 04/11/2021   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 03/09/2021   Palpitations 09/05/2020    SOB (shortness of breath) 09/05/2020   Chest pain 09/05/2020   Mixed hyperlipidemia 06/06/2020   Primary insomnia 06/06/2020   Primary parkinsonism (HCC) 10/26/2019   Osteoporosis 09/15/2019   Blister of great toe of left foot 08/19/2019   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   Moderate episode of recurrent major depressive disorder (HCC) 02/09/2019   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 02/09/2019   Shuffling gait 02/09/2019   Balance problems 02/09/2019   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   Orthostatic hypotension 02/01/2019   Dizziness 02/01/2019   Primary osteoarthritis of right foot 09/25/2018   Snoring 09/25/2018   Night terrors, adult 09/25/2018   Multiple falls 09/25/2018   Metatarsalgia of left foot 08/20/2018   Chronic foot pain, right 08/20/2018   Left hip pain 04/10/2017   History of shingles 04/08/2017   Adenomatous polyp 04/08/2017   Pure hypercholesterolemia 04/08/2017   History of kidney stones 04/08/2017   Essential hypertension 04/08/2017   Depression, recurrent (HCC) 04/08/2017   Anxiety 04/08/2017   Greater trochanteric bursitis of left hip 04/08/2017    PCP: Antoniette Vermell CROME, PA-C REFERRING PROVIDER: Evonnie Asberry RAMAN, DO REFERRING DIAG:  G20.A1 (ICD-10-CM) - Parkinson's disease without dyskinesia or fluctuating manifestations (HCC)  M41.9 (ICD-10-CM) - Scoliosis, unspecified scoliosis type, unspecified spinal region    THERAPY DIAG:  No diagnosis found.  Rationale for Evaluation and Treatment:  Rehabilitation  ONSET DATE: one year  SUBJECTIVE:   SUBJECTIVE STATEMENT:  The patient reports she had a small skin cancer removed on her L chest-- she did not require a stitch. She reports that her balance is worse today. Her back is hurting from bending a lot. She thinks the DN helped her low back.   PERTINENT HISTORY: Osteoporosis (cannot take meds), PD, R L 4/5 laminectomy, HTN, scoliosis, h/o falls PAIN:  Are you having pain? Yes: NPRS scale:  mild due to frequent bending Pain location: R lumbar Pain description: stabbing spasm Aggravating factors: loading dishwasher, standing in bathroom, repetitive bending Relieving factors: sitting  PRECAUTIONS: Fall and Other: OSTEOPOROSIS  WEIGHT BEARING RESTRICTIONS: No  FALLS:  Has patient fallen in last 6 months? No  LIVING ENVIRONMENT: Lives with: lives with their family and lives alone Lives in: House/apartment Stairs: Yes: External: 7 steps; can reach both Has following equipment at home: Retail banker - 2 wheeled  PLOF: Independent  PATIENT GOALS: get rid of pain, walk better, improve balance  NEXT MD VISIT: October  OBJECTIVE:  Note: Objective measures were completed at Evaluation unless otherwise noted.  DIAGNOSTIC FINDINGS: none  PATIENT SURVEYS:  The Patient-Specific Functional Scale  Initial:  I am going to ask you to identify up to 3 important activities that you are unable to do or are having difficulty with as a result of this problem.  Today are there any activities that you are unable to do or having difficulty with because of this?  (Patient shown scale and patient rated each activity)  Follow up: When you first came in you had difficulty performing these activities.  Today do you still have difficulty?  Patient-Specific activity scoring scheme (Point to one number):  0 1 2 3 4 5 6 7 8 9  10 Unable                                                                                                          Able to perform To perform                                                                                                    activity at the same Activity         Level as before  Injury or problem  Activity       Cooking/kitchen chores                                                                          Initial:      0                  follow up: 8 on 5/27  2.         Walking                                                                      Initial:         4              follow up: 7 on 5/27  3.         Sit to stand                                                                    Initial:        4               follow up: 7 on 5/27    07/27/24: ABC score 18%   COGNITION: Overall cognitive status: Within functional limits for tasks assessed     SENSATION: Not tested  MUSCLE LENGTH: B piriformis and HS tightness, R QL and lumbar  POSTURE: rounded shoulders, forward head, flexed trunk , and left thoracic R lumbar scoliosis (convexity)  PALPATION: Marked pain R QL, and R gluteals near greater trochanter  LUMBAR ROM:   Active  A/PROM  eval  Flexion WFL  Extension WFL  Right lateral flexion   Left lateral flexion   Right rotation NT  Left rotation NT   (Blank rows = not tested)   LOWER EXTREMITY ROM: WFL for tasks assessed  Active ROM Right eval Left eval  Hip flexion    Hip extension    Hip abduction    Hip adduction    Hip internal rotation 15* 20  Hip external rotation    Knee flexion    Knee extension    Ankle dorsiflexion    Ankle plantarflexion    Ankle inversion    Ankle eversion     (Blank rows = not tested) * cramping  LOWER EXTREMITY MMT: tested in sitting  MMT Right eval Left eval Right 6/17 Left  6/17 07/27/24  Hip flexion 4 4 4 4    Hip extension       Hip abduction 4 4+ 5 in sitting 5  in sitting   Hip adduction 5 5     Hip internal rotation       Hip external rotation  Knee flexion   5 4+   Knee extension 4+ 4+ 4+ 4   Ankle dorsiflexion 4+ 4+ 5 5 4  bilateral   Ankle plantarflexion       Ankle inversion       Ankle eversion        (Blank rows = not tested)   FUNCTIONAL TESTS:  5 times sit to stand: 25.08 sec  04/20/24  13.35 sec   07/15/24 TUG 12.13 sec  07/27/24:  5 x STS: 22.5 seconds   Lifting: excessive trunk flexion    06/24/24 Negative hip scour R Negaive pain with hip PROM Negative pain with standing weight shift onto R LE No pain with MMT of hip and knee, but pain with active hip ABDuction in S/L Pain with ambulation    [facility] Adult PT Treatment:                                                DATE: 08/10/24 Therapeutic Exercise: Sidelying Hip hike and depression x 10 reps Supine Isometric hip engagement pressing into a ball x 5 seconds x 5 reps R and L  Attempted bridge-- but creates grabbing sensation in low back so discontinued Neuromuscular re-ed: PWR! Exercises Standing pwr up Standing pwr weight shift Standing pwr step Standing pwr twist X 6 reps each R and L sides With SBA for safety Supine twist x 4 reps Supine large amplitude stepping x 5 reps R And L Therapeutic Activity: Combined large amplitude movements with rock and reach x 5 reps R and L sides with feet in stride position Standing with yoga block isometric squeeze and R reaching to help with postural alignment   [facility] Adult PT Treatment:                                                DATE: 08/04/24  Manual Therapy: STM Rt QL, lumbar paraspinals Trigger Point Dry Needling Subsequent Treatment: Instructions provided previously at initial dry needling treatment.  Patient Verbal Consent Given: Yes Education Handout Provided: Previously Provided Muscles Treated: Rt QL Electrical Stimulation Performed: No Treatment Response/Outcome: twitch response Neuromuscular re-ed: Gait with figure 8 turns Gait with stepping over obstacles Therapeutic Activity: Sit <> stand x 10 Picking up items off of ground with focus on improving form  [facility] Adult PT Treatment:                                                DATE: 07/27/24 Therapeutic Exercise: Seated toe raises x 10  HEP review   Therapeutic Activity: Re-assessment to determine overall progress, educating patient on progress towards goals.  Sit to stand x 10  Walking with  turning: left and right  Forward and lateral step overs yoga block x 5 each    [facility] Adult PT Treatment:                                                DATE:07/22/2024  Therapeutic  Activity: gait belt/SBA with all TA PWR moves in standing - trunk rotation with hand clap, Weight shift reaches, step outs laterally with UE reach x 10  BERG 51/56 Worked on turning especially to the left using figure 8's and larger to smaller circles encouraging full step length Agility ladder - various stepping patterns to challenge cognition. Also walking straight through ladder encouraging full step length and arm swing.  Gait - multiple laps around clinic having patient name cities in A to Z order     [facility] Adult PT Treatment:                                                DATE:07/20/2024  Therapeutic Exercise: Therapeutic Exercise: Seated: Hip abd + green TB x15 Unilateral hip ABD R with no band x 10 LAQ + 3#AW 1x10, 1x 5, marching x 5 ea (some left hip tenderness) Therapeutic Activity: gait belt/SBA with all TA PWR moves in standing - trunk rotation with hand clap, Weight shift reaches, step outs laterally with UE reach x 10  Side stepping over blue foam beam with green tband around waist to simulate door pressure - moved to Room 1 doorway and practiced with door pushing against patient multiple reps Agility ladder side step in and then step fwd diagonally to outside next square was most challenging    [facility] Adult PT Treatment:                                                DATE: 07/15/2024 Therapeutic Exercise: STS + overhead reach in standing x 10, then with bil shoulder ext and chest forward x 10 Seated: Hip abd + green TB x15 LAQ + 3#AW 2x10 Therapeutic Activity: gait belt/SBA with all TA  Big Walking arms and legs - longer strides and working on gait swing PWR moves in standing - trunk rotation with hand clap, step outs laterally with UE reach x 10  Walk around the square - with cognitive  challenges  Zig Zag Drill with cones  Figure 8 - (2 circles set up with cones) - more challenging with left foot near circle TUG/goals assessed     [facility] Adult PT Treatment:                                                DATE: 07/13/2024 Therapeutic Exercise: STS + overhead reach in standing  Side step with big arm reach to the side Seated: Hip abd + green TB x15 Ankle eversion B Therapeutic Activity: Cone weaving with SPC multiple cones narrow spaced and wide spaced -CGA Self Care: discussion of use of rhythm or counting to avoid freezing of gait especially when approaching bed.  Manual Therapy: Trigger Point Dry Needling  Subsequent Treatment: Instructions reviewed, if requested by the patient, prior to subsequent dry needling treatment.   Patient Verbal Consent Given: Yes Education Handout Provided: Previously Provided Muscles Treated: R QL and gluteus medius  Electrical Stimulation Performed: No Treatment Response/Outcome: Utilized skilled palpation to identify bony landmarks and trigger points.  Able to illicit twitch response and muscle elongation.  Soft tissue mobilization to muscles needled to further promote tissue elongation and decreased pain.     PATIENT EDUCATION:  Education details: HEP review   Person educated: Patient Education method: Explanation Education comprehension: verbalized understanding  HOME EXERCISE PROGRAM: Access Code: [email or web address] URL: [email or web address] Date: 06/14/2024 Prepared by: Lamarr Price  Exercises - Standing Lumbar Extension with Counter  - 3 x daily - 7 x weekly - 1 sets - 10 reps - Supine Figure 4 Piriformis Stretch  - 1 x daily - 7 x weekly - 3 sets - 10 reps - Supine Lower Trunk Rotation  - 1 x daily - 7 x weekly - 3 sets - 10 reps - QL Stretch Supine  - 1 x daily - 7 x weekly - 3 sets - 10 reps - Seated Isometric Hip Abduction with Belt  - 1 x daily - 7 x weekly - 1-2 sets - 10 reps - 5 sec hold - Wall Push Up   - 1 x daily - 3 x weekly - 2 sets - 10 reps - Tandem Walking with Counter Support  - 1 x daily - 7 x weekly - 3 sets - 10 reps - Heel Walking with Counter Support  - 1 x daily - 7 x weekly - 3 sets - 10 reps - Seated Hip Abduction with Resistance  - 1 x daily - 7 x weekly - 3 sets - 10 reps  ASSESSMENT:  CLINICAL IMPRESSION: Pt continues with c/o back pain and states dry needling does help the pain. She is improving gait as long as she goes slowly. She continues to c/o difficulty bringing items into her house due to the storm door  EVAL: Patient is a 83 y.o. female who was seen today for physical therapy evaluation and treatment for muscles spasms secondary to her scoliosis and for balance deficits secondary to PD. She also reports new onset of R hip pain 5 days ago. The back spasms and hip pain are limiting her ability to walk, perform sit to stand, and perform standing kitchen and bathroom chores. She denies any falls in the past 6 months, but reports that her balance is not good. She uses a walker intermittently, mainly at the EOD and if she gets up during the night. She has deficits in flexibility and strength as well as posture and balance. She will benefit from skilled PT to address these deficits.  She experienced back spasm when attempting to do a supine hip stretch, so initial trial of DN performed on R QL with good twitch response. Patient reported decreased pain at end of session and was able to move without spasm.  OBJECTIVE IMPAIRMENTS: Abnormal gait, decreased activity tolerance, decreased balance, decreased ROM, decreased strength, increased muscle spasms, impaired flexibility, postural dysfunction, and pain.    GOALS: Goals reviewed with patient? Yes  SHORT TERM GOALS: Target date: 04/28/24 Decreased back spasms by >= 50% to improve QOL 07/27/24: 25% improvement  Goal status: IN PROGRESS 25% 5/6  2.  Decreased hip pain by 50% with steps, transfers and walking. Baseline:  Goal  status: MET  3.  Balance assessment completed at second visit and goals updated. Baseline:  Goal status: MET   LONG TERM GOALS: Target date: 09/11/24  Patient able to perform ADLS without back spasms. Baseline:  Goal status: MET 06/01/24  2.  Pt able to walk and climb stairs without R hip pain. Baseline: 05/11/24: 75% better  07/05/24: 90% better Goal status: MET  3.  Improved 5xSTS  by 5 seconds demonstrating improved strength and decreasing risk for falls. Baseline: 25.08 seconds. Goal status: MET 04/20/24 13.35 sec  4.  Improved BERG score by 8 points showing functional improvement Baseline: 43/56 05/14/24: 48/56 06/01/24  47/56 07/22/24  51/56 Goal status: MET 07/22/24  5.  Improve PSFS by 2-3 points  Baseline: see objective Goal status: MET 05/11/24  6.  Patient to demonstrate improved balance and hip strength by being able to enter in/out of her front and back doors safely without LOB. Baseline: 07/27/24: able to step safely without LOB Goal status: MET  7. Patient able to walk in her bathroom and perform toilet transfers with no LOB.  Baseline:  Goal status: MET 07/15/24  8.  Patient able to clear 6 inch obstacles forward and laterally using her SPC or one UE support demonstrating improved SLS time and balance.  Baseline:  07/27/24: able to clear 6 inch with single UE support  Goal status: MET   9.  Decreased TUG score to 11.3 sec with SPC demonstrating decreased risk for falls.  Baseline:  07/07/24: 16 sec 07/15/24: 12.13 sec  07/27/24: 15 sec Goal status: IN PROGRESS   10. Patient will complete 5 x STS in </= 18 seconds to improve functional strength.   Baseline: see above  Goal status: NEW   11. Patient will improve ABC score by at least 15% to improve balance confidence.  Baseline: 18%  Goal Status: NEW  12. Patient will demonstrate proper hip hinge to reduce stress on her back with lifting activity.   Baseline: no knowledge  Goal Status: NEW    PLAN:  PT  FREQUENCY: 1x/week  PT DURATION: 6 weeks  PLANNED INTERVENTIONS: [id number]- PT Re-evaluation, 97110-Therapeutic exercises, 97530- Therapeutic activity, [id number]- Neuromuscular re-education, 97535- Self Care, [id number]- Manual therapy, [phone]- Gait training, [phone]- Electrical stimulation (unattended), [phone]- Ionotophoresis 4mg /ml Dexamethasone , Patient/Family education, Balance training, Stair training, Taping, Dry Needling, Joint mobilization, Cryotherapy, and Moist heat  PLAN FOR NEXT SESSION:  Continue figure 8's and narrow spaced areas, backwards walking.  Big stepping activities (dots, over blocks, step ups, agility ladder, etc). Continue standing up and overs to strengthen hips for clearing entryway at home, PRECAUTION: OSTEOPOROSIS. Lifting mechanics. Incorporate PWR! *give smart phrase for freezing gait tips   Naod Sweetland, PT 08/26/253:50 PM

## 2024-08-17 ENCOUNTER — Encounter: Payer: Self-pay | Admitting: Sports Medicine

## 2024-08-17 ENCOUNTER — Ambulatory Visit: Attending: Neurology | Admitting: Physical Therapy

## 2024-08-17 ENCOUNTER — Encounter: Payer: Self-pay | Admitting: Physical Therapy

## 2024-08-17 DIAGNOSIS — M5459 Other low back pain: Secondary | ICD-10-CM | POA: Insufficient documentation

## 2024-08-17 DIAGNOSIS — R2681 Unsteadiness on feet: Secondary | ICD-10-CM | POA: Insufficient documentation

## 2024-08-17 DIAGNOSIS — R2689 Other abnormalities of gait and mobility: Secondary | ICD-10-CM | POA: Diagnosis present

## 2024-08-17 NOTE — Therapy (Signed)
 OUTPATIENT PHYSICAL THERAPY LUMBAR AND LE TREATMENT   Patient Name: Chelsey Estes MRN: [id number] DOB:11/15/1941, 83 y.o., female Today's Date: 08/17/2024  END OF SESSION:  PT End of Session - 08/17/24 1150     Visit Number 30    Number of Visits 33    Date for PT Re-Evaluation 09/11/24    Authorization Type MCR    Progress Note Due on Visit 34    PT Start Time 1100    PT Stop Time 1143    PT Time Calculation (min) 43 min    Activity Tolerance Patient tolerated treatment well    Behavior During Therapy WFL for tasks assessed/performed          Past Medical History:  Diagnosis Date   Anxiety    Arthritis    Depression    Glaucoma    History of kidney stones    7 lithrotripsy   Hyperlipidemia    Hypertension    Parkinson disease (HCC)    Past Surgical History:  Procedure Laterality Date   ABDOMINAL HYSTERECTOMY     ANTERIOR (CYSTOCELE) AND POSTERIOR REPAIR (RECTOCELE) WITH XENFORM GRAFT AND SACROSPINOUS FIXATION     CATARACT EXTRACTION, BILATERAL     INGUINAL HERNIA REPAIR Left    kidney stones     LUMBAR LAMINECTOMY/DECOMPRESSION MICRODISCECTOMY Right 10/12/2021   Procedure: Right Lumbar four-five Laminectomy for facet/synovial cyst;  Surgeon: Joshua Alm RAMAN, MD;  Location: [facility] OR;  Service: Neurosurgery;  Laterality: Right;   Patient Active Problem List   Diagnosis Date Noted   Dysuria 02/25/2024   Leukocytes in urine 02/25/2024   Pruritus 10/17/2023   Heart palpitations 07/14/2023   History of hypokalemia 07/14/2023   Vitamin D  deficiency 12/02/2022   Kidney stone 11/22/2022   Tinnitus of left ear 07/03/2022   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 07/03/2022   Decreased GFR 04/17/2022   Medication management 04/16/2022   Synovial cyst of lumbar facet joint 09/19/2021   DDD (degenerative disc disease), lumbar 04/11/2021   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 03/09/2021   Palpitations 09/05/2020    SOB (shortness of breath) 09/05/2020   Chest pain 09/05/2020   Mixed hyperlipidemia 06/06/2020   Primary insomnia 06/06/2020   Primary parkinsonism (HCC) 10/26/2019   Osteoporosis 09/15/2019   Blister of great toe of left foot 08/19/2019   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   Moderate episode of recurrent major depressive disorder (HCC) 02/09/2019   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 02/09/2019   Shuffling gait 02/09/2019   Balance problems 02/09/2019   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   Orthostatic hypotension 02/01/2019   Dizziness 02/01/2019   Primary osteoarthritis of right foot 09/25/2018   Snoring 09/25/2018   Night terrors, adult 09/25/2018   Multiple falls 09/25/2018   Metatarsalgia of left foot 08/20/2018   Chronic foot pain, right 08/20/2018   Left hip pain 04/10/2017   History of shingles 04/08/2017   Adenomatous polyp 04/08/2017   Pure hypercholesterolemia 04/08/2017   History of kidney stones 04/08/2017   Essential hypertension 04/08/2017   Depression, recurrent (HCC) 04/08/2017   Anxiety 04/08/2017   Greater trochanteric bursitis of left hip 04/08/2017    PCP: Antoniette Vermell CROME, PA-C REFERRING PROVIDER: Evonnie Asberry RAMAN, DO REFERRING DIAG:  G20.A1 (ICD-10-CM) - Parkinson's disease without dyskinesia or fluctuating manifestations (HCC)  M41.9 (ICD-10-CM) - Scoliosis, unspecified scoliosis type, unspecified spinal region    THERAPY DIAG:  Unsteadiness on feet  Other abnormalities of gait  and mobility  Other low back pain  Rationale for Evaluation and Treatment: Rehabilitation  ONSET DATE: one year  SUBJECTIVE:   SUBJECTIVE STATEMENT:  The patient reports she feels a little woozy today. She felt good yesterday, but has been feeling off this morning. She states her back is feeling better   PERTINENT HISTORY: Osteoporosis (cannot take meds), PD, R L 4/5 laminectomy, HTN, scoliosis, h/o falls PAIN:  Are you having pain? Yes: NPRS scale:  mild due to frequent bending Pain location: R lumbar Pain description: stabbing spasm Aggravating factors: loading dishwasher, standing in bathroom, repetitive bending Relieving factors: sitting  PRECAUTIONS: Fall and Other: OSTEOPOROSIS  WEIGHT BEARING RESTRICTIONS: No  FALLS:  Has patient fallen in last 6 months? No  LIVING ENVIRONMENT: Lives with: lives with their family and lives alone Lives in: House/apartment Stairs: Yes: External: 7 steps; can reach both Has following equipment at home: Retail banker - 2 wheeled  PLOF: Independent  PATIENT GOALS: get rid of pain, walk better, improve balance  NEXT MD VISIT: October  OBJECTIVE:  Note: Objective measures were completed at Evaluation unless otherwise noted.  DIAGNOSTIC FINDINGS: none  PATIENT SURVEYS:  The Patient-Specific Functional Scale  Initial:  I am going to ask you to identify up to 3 important activities that you are unable to do or are having difficulty with as a result of this problem.  Today are there any activities that you are unable to do or having difficulty with because of this?  (Patient shown scale and patient rated each activity)  Follow up: When you first came in you had difficulty performing these activities.  Today do you still have difficulty?  Patient-Specific activity scoring scheme (Point to one number):  0 1 2 3 4 5 6 7 8 9  10 Unable                                                                                                          Able to perform To perform                                                                                                    activity at the same Activity         Level as before  Injury or problem  Activity       Cooking/kitchen chores                                                                          Initial:      0                  follow up: 8 on 5/27  2.         Walking                                                                      Initial:         4              follow up: 7 on 5/27  3.         Sit to stand                                                                    Initial:        4               follow up: 7 on 5/27    07/27/24: ABC score 18%   COGNITION: Overall cognitive status: Within functional limits for tasks assessed     SENSATION: Not tested  MUSCLE LENGTH: B piriformis and HS tightness, R QL and lumbar  POSTURE: rounded shoulders, forward head, flexed trunk , and left thoracic R lumbar scoliosis (convexity)  PALPATION: Marked pain R QL, and R gluteals near greater trochanter  LUMBAR ROM:   Active  A/PROM  eval  Flexion WFL  Extension WFL  Right lateral flexion   Left lateral flexion   Right rotation NT  Left rotation NT   (Blank rows = not tested)   LOWER EXTREMITY ROM: WFL for tasks assessed  Active ROM Right eval Left eval  Hip flexion    Hip extension    Hip abduction    Hip adduction    Hip internal rotation 15* 20  Hip external rotation    Knee flexion    Knee extension    Ankle dorsiflexion    Ankle plantarflexion    Ankle inversion    Ankle eversion     (Blank rows = not tested) * cramping  LOWER EXTREMITY MMT: tested in sitting  MMT Right eval Left eval Right 6/17 Left  6/17 07/27/24  Hip flexion 4 4 4 4    Hip extension       Hip abduction 4 4+ 5 in sitting 5  in sitting   Hip adduction 5 5     Hip internal rotation       Hip external rotation  Knee flexion   5 4+   Knee extension 4+ 4+ 4+ 4   Ankle dorsiflexion 4+ 4+ 5 5 4  bilateral   Ankle plantarflexion       Ankle inversion       Ankle eversion        (Blank rows = not tested)   FUNCTIONAL TESTS:  5 times sit to stand: 25.08 sec  04/20/24  13.35 sec   07/15/24 TUG 12.13 sec  07/27/24:  5 x STS: 22.5 seconds   Lifting: excessive trunk flexion    06/24/24 Negative hip scour R Negaive pain with hip PROM Negative pain with standing weight shift onto R LE No pain with MMT of hip and knee, but pain with active hip ABDuction in S/L Pain with ambulation    [facility] Adult PT Treatment:                                                DATE: 08/17/24 Therapeutic Exercise: Figure 4 stretch 2 x 30 sec bilat  Neuromuscular re-ed: Standing PWR! Exercises - CGA for safety PWR! Up PWR! Step PWR! [employer]! Twist Step and reach laterally Step and reach A/P - min A for balance Supine PWR! Exercises PWR! twist PWR! step Therapeutic Activity: Seated reach to floor with focus on improving hip hinge movement Standing hip hinge with cuing for form   [facility] Adult PT Treatment:                                                DATE: 08/10/24 Therapeutic Exercise: Sidelying Hip hike and depression x 10 reps Supine Isometric hip engagement pressing into a ball x 5 seconds x 5 reps R and L  Attempted bridge-- but creates grabbing sensation in low back so discontinued Neuromuscular re-ed: PWR! Exercises Standing pwr up Standing pwr weight shift Standing pwr step Standing pwr twist X 6 reps each R and L sides With SBA for safety Supine twist x 4 reps Supine large amplitude stepping x 5 reps R And L Therapeutic Activity: Combined large amplitude movements with rock and reach x 5 reps R and L sides with feet in stride position Standing with yoga block isometric squeeze and R reaching to help with postural alignment   [facility] Adult PT Treatment:                                                DATE: 08/04/24  Manual Therapy: STM Rt QL, lumbar paraspinals Trigger Point Dry Needling Subsequent Treatment: Instructions provided previously at initial dry needling treatment.  Patient Verbal Consent Given: Yes Education Handout Provided: Previously Provided Muscles Treated: Rt QL Electrical Stimulation Performed: No Treatment Response/Outcome: twitch  response Neuromuscular re-ed: Gait with figure 8 turns Gait with stepping over obstacles Therapeutic Activity: Sit <> stand x 10 Picking up items off of ground with focus on improving form  [facility] Adult PT Treatment:  DATE: 07/27/24 Therapeutic Exercise: Seated toe raises x 10  HEP review   Therapeutic Activity: Re-assessment to determine overall progress, educating patient on progress towards goals.  Sit to stand x 10  Walking with turning: left and right  Forward and lateral step overs yoga block x 5 each     PATIENT EDUCATION:  Education details: HEP review   Person educated: Patient Education method: Explanation Education comprehension: verbalized understanding  HOME EXERCISE PROGRAM: Access Code: [email or web address] URL: [email or web address] Date: 06/14/2024 Prepared by: Lamarr Price  Exercises - Standing Lumbar Extension with Counter  - 3 x daily - 7 x weekly - 1 sets - 10 reps - Supine Figure 4 Piriformis Stretch  - 1 x daily - 7 x weekly - 3 sets - 10 reps - Supine Lower Trunk Rotation  - 1 x daily - 7 x weekly - 3 sets - 10 reps - QL Stretch Supine  - 1 x daily - 7 x weekly - 3 sets - 10 reps - Seated Isometric Hip Abduction with Belt  - 1 x daily - 7 x weekly - 1-2 sets - 10 reps - 5 sec hold - Wall Push Up  - 1 x daily - 3 x weekly - 2 sets - 10 reps - Tandem Walking with Counter Support  - 1 x daily - 7 x weekly - 3 sets - 10 reps - Heel Walking with Counter Support  - 1 x daily - 7 x weekly - 3 sets - 10 reps - Seated Hip Abduction with Resistance  - 1 x daily - 7 x weekly - 3 sets - 10 reps  ASSESSMENT:  CLINICAL IMPRESSION: Pt with good response to PWR! Moves for balance and mobility. She demos good balance with lateral stepping and wt shifts but requires min A with A/P wt shifts and stepping. She is improving tolerance to back stretching and mobility and has less back pain.  EVAL: Patient is a 83 y.o.  female who was seen today for physical therapy evaluation and treatment for muscles spasms secondary to her scoliosis and for balance deficits secondary to PD. She also reports new onset of R hip pain 5 days ago. The back spasms and hip pain are limiting her ability to walk, perform sit to stand, and perform standing kitchen and bathroom chores. She denies any falls in the past 6 months, but reports that her balance is not good. She uses a walker intermittently, mainly at the EOD and if she gets up during the night. She has deficits in flexibility and strength as well as posture and balance. She will benefit from skilled PT to address these deficits.  She experienced back spasm when attempting to do a supine hip stretch, so initial trial of DN performed on R QL with good twitch response. Patient reported decreased pain at end of session and was able to move without spasm.  OBJECTIVE IMPAIRMENTS: Abnormal gait, decreased activity tolerance, decreased balance, decreased ROM, decreased strength, increased muscle spasms, impaired flexibility, postural dysfunction, and pain.    GOALS: Goals reviewed with patient? Yes  SHORT TERM GOALS: Target date: 04/28/24 Decreased back spasms by >= 50% to improve QOL 07/27/24: 25% improvement  Goal status: IN PROGRESS 25% 5/6  2.  Decreased hip pain by 50% with steps, transfers and walking. Baseline:  Goal status: MET  3.  Balance assessment completed at second visit and goals updated. Baseline:  Goal status: MET   LONG TERM GOALS: Target date: 09/11/24  Patient able  to perform ADLS without back spasms. Baseline:  Goal status: MET 06/01/24  2.  Pt able to walk and climb stairs without R hip pain. Baseline: 05/11/24: 75% better  07/05/24: 90% better Goal status: MET  3.  Improved 5xSTS by 5 seconds demonstrating improved strength and decreasing risk for falls. Baseline: 25.08 seconds. Goal status: MET 04/20/24 13.35 sec  4.  Improved BERG score by 8  points showing functional improvement Baseline: 43/56 05/14/24: 48/56 06/01/24  47/56 07/22/24  51/56 Goal status: MET 07/22/24  5.  Improve PSFS by 2-3 points  Baseline: see objective Goal status: MET 05/11/24  6.  Patient to demonstrate improved balance and hip strength by being able to enter in/out of her front and back doors safely without LOB. Baseline: 07/27/24: able to step safely without LOB Goal status: MET  7. Patient able to walk in her bathroom and perform toilet transfers with no LOB.  Baseline:  Goal status: MET 07/15/24  8.  Patient able to clear 6 inch obstacles forward and laterally using her SPC or one UE support demonstrating improved SLS time and balance.  Baseline:  07/27/24: able to clear 6 inch with single UE support  Goal status: MET   9.  Decreased TUG score to 11.3 sec with SPC demonstrating decreased risk for falls.  Baseline:  07/07/24: 16 sec 07/15/24: 12.13 sec  07/27/24: 15 sec Goal status: IN PROGRESS   10. Patient will complete 5 x STS in </= 18 seconds to improve functional strength.   Baseline: see above  Goal status: NEW   11. Patient will improve ABC score by at least 15% to improve balance confidence.  Baseline: 18%  Goal Status: NEW  12. Patient will demonstrate proper hip hinge to reduce stress on her back with lifting activity.   Baseline: no knowledge  Goal Status: NEW    PLAN:  PT FREQUENCY: 1x/week  PT DURATION: 6 weeks  PLANNED INTERVENTIONS: [id number]- PT Re-evaluation, 97110-Therapeutic exercises, 97530- Therapeutic activity, [id number]- Neuromuscular re-education, 97535- Self Care, [id number]- Manual therapy, [id number]- Gait training, [phone]- Electrical stimulation (unattended), [phone]- Ionotophoresis 4mg /ml Dexamethasone , Patient/Family education, Balance training, Stair training, Taping, Dry Needling, Joint mobilization, Cryotherapy, and Moist heat  PLAN FOR NEXT SESSION:  Continue figure 8's and narrow spaced areas, backwards walking.  Big  stepping activities (dots, over blocks, step ups, agility ladder, etc). Continue standing up and overs to strengthen hips for clearing entryway at home, PRECAUTION: OSTEOPOROSIS. Lifting mechanics. Incorporate PWR! *give smart phrase for freezing gait tips   Yakov Bergen, PT 08/17/2510:51 AM

## 2024-08-23 ENCOUNTER — Other Ambulatory Visit: Payer: Self-pay | Admitting: Neurology

## 2024-08-23 DIAGNOSIS — G20A1 Parkinson's disease without dyskinesia, without mention of fluctuations: Secondary | ICD-10-CM

## 2024-08-23 DIAGNOSIS — G20B1 Parkinson's disease with dyskinesia, without mention of fluctuations: Secondary | ICD-10-CM

## 2024-08-24 NOTE — Therapy (Unsigned)
 OUTPATIENT PHYSICAL THERAPY LUMBAR AND LE TREATMENT   Patient Name: Chelsey Estes MRN: [id number] DOB:01-22-1941, 83 y.o., female Today's Date: 08/24/2024  END OF SESSION:    Past Medical History:  Diagnosis Date   Anxiety    Arthritis    Depression    Glaucoma    History of kidney stones    7 lithrotripsy   Hyperlipidemia    Hypertension    Parkinson disease (HCC)    Past Surgical History:  Procedure Laterality Date   ABDOMINAL HYSTERECTOMY     ANTERIOR (CYSTOCELE) AND POSTERIOR REPAIR (RECTOCELE) WITH XENFORM GRAFT AND SACROSPINOUS FIXATION     CATARACT EXTRACTION, BILATERAL     INGUINAL HERNIA REPAIR Left    kidney stones     LUMBAR LAMINECTOMY/DECOMPRESSION MICRODISCECTOMY Right 10/12/2021   Procedure: Right Lumbar four-five Laminectomy for facet/synovial cyst;  Surgeon: Joshua Alm RAMAN, MD;  Location: [facility] OR;  Service: Neurosurgery;  Laterality: Right;   Patient Active Problem List   Diagnosis Date Noted   Dysuria 02/25/2024   Leukocytes in urine 02/25/2024   Pruritus 10/17/2023   Heart palpitations 07/14/2023   History of hypokalemia 07/14/2023   Vitamin D  deficiency 12/02/2022   Kidney stone 11/22/2022   Tinnitus of left ear 07/03/2022   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 07/03/2022   Decreased GFR 04/17/2022   Medication management 04/16/2022   Synovial cyst of lumbar facet joint 09/19/2021   DDD (degenerative disc disease), lumbar 04/11/2021   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 03/09/2021   Palpitations 09/05/2020   SOB (shortness of breath) 09/05/2020   Chest pain 09/05/2020   Mixed hyperlipidemia 06/06/2020   Primary insomnia 06/06/2020   Primary parkinsonism (HCC) 10/26/2019   Osteoporosis 09/15/2019   Blister of great toe of left foot 08/19/2019   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   Moderate episode of recurrent major depressive disorder (HCC) 02/09/2019   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 02/09/2019   Shuffling gait 02/09/2019   Balance problems 02/09/2019   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   Orthostatic hypotension 02/01/2019   Dizziness 02/01/2019   Primary osteoarthritis of right foot 09/25/2018   Snoring 09/25/2018   Night terrors, adult 09/25/2018   Multiple falls 09/25/2018   Metatarsalgia of left foot 08/20/2018   Chronic foot pain, right 08/20/2018   Left hip pain 04/10/2017   History of shingles 04/08/2017   Adenomatous polyp 04/08/2017   Pure hypercholesterolemia 04/08/2017   History of kidney stones 04/08/2017   Essential hypertension 04/08/2017   Depression, recurrent (HCC) 04/08/2017   Anxiety 04/08/2017   Greater trochanteric bursitis of left hip 04/08/2017    PCP: Antoniette Vermell CROME, PA-C REFERRING PROVIDER: Evonnie Asberry RAMAN, DO REFERRING DIAG:  G20.A1 (ICD-10-CM) - Parkinson's disease without dyskinesia or fluctuating manifestations (HCC)  M41.9 (ICD-10-CM) - Scoliosis, unspecified scoliosis type, unspecified spinal region    THERAPY DIAG:  No diagnosis found.  Rationale for Evaluation and Treatment: Rehabilitation  ONSET DATE: one year  SUBJECTIVE:   SUBJECTIVE STATEMENT:  ***The patient reports she feels a little woozy today. She felt good yesterday, but has been feeling off this morning. She states her back is feeling better   PERTINENT HISTORY: Osteoporosis (cannot take meds), PD, R L 4/5 laminectomy, HTN, scoliosis, h/o falls PAIN:  Are you having pain? Yes: NPRS scale: mild due to frequent bending Pain location: R lumbar Pain description: stabbing spasm Aggravating factors: loading dishwasher, standing in bathroom, repetitive bending Relieving factors: sitting  PRECAUTIONS: Fall and Other: OSTEOPOROSIS  WEIGHT BEARING RESTRICTIONS: No  FALLS:  Has patient fallen in last 6 months? No  LIVING ENVIRONMENT: Lives with: lives with their family and lives alone Lives in: House/apartment Stairs: Yes: External:  7 steps; can reach both Has following equipment at home: Retail banker - 2 wheeled  PLOF: Independent  PATIENT GOALS: get rid of pain, walk better, improve balance  NEXT MD VISIT: October  OBJECTIVE:  Note: Objective measures were completed at Evaluation unless otherwise noted.  DIAGNOSTIC FINDINGS: none  PATIENT SURVEYS:  The Patient-Specific Functional Scale  Initial:  I am going to ask you to identify up to 3 important activities that you are unable to do or are having difficulty with as a result of this problem.  Today are there any activities that you are unable to do or having difficulty with because of this?  (Patient shown scale and patient rated each activity)  Follow up: When you first came in you had difficulty performing these activities.  Today do you still have difficulty?  Patient-Specific activity scoring scheme (Point to one number):  0 1 2 3 4 5 6 7 8 9  10 Unable                                                                                                          Able to perform To perform                                                                                                    activity at the same Activity         Level as before                                                                                                                       Injury or problem  Activity       Cooking/kitchen chores  Initial:      0                 follow up: 8 on 5/27  2.         Walking                                                                      Initial:         4              follow up: 7 on 5/27  3.         Sit to stand                                                                    Initial:        4               follow up: 7 on 5/27    07/27/24: ABC score 18%   COGNITION: Overall cognitive status: Within functional limits for tasks  assessed     SENSATION: Not tested  MUSCLE LENGTH: B piriformis and HS tightness, R QL and lumbar  POSTURE: rounded shoulders, forward head, flexed trunk , and left thoracic R lumbar scoliosis (convexity)  PALPATION: Marked pain R QL, and R gluteals near greater trochanter  LUMBAR ROM:   Active  A/PROM  eval  Flexion WFL  Extension WFL  Right lateral flexion   Left lateral flexion   Right rotation NT  Left rotation NT   (Blank rows = not tested)   LOWER EXTREMITY ROM: WFL for tasks assessed  Active ROM Right eval Left eval  Hip flexion    Hip extension    Hip abduction    Hip adduction    Hip internal rotation 15* 20  Hip external rotation    Knee flexion    Knee extension    Ankle dorsiflexion    Ankle plantarflexion    Ankle inversion    Ankle eversion     (Blank rows = not tested) * cramping  LOWER EXTREMITY MMT: tested in sitting  MMT Right eval Left eval Right 6/17 Left  6/17 07/27/24  Hip flexion 4 4 4 4    Hip extension       Hip abduction 4 4+ 5 in sitting 5  in sitting   Hip adduction 5 5     Hip internal rotation       Hip external rotation       Knee flexion   5 4+   Knee extension 4+ 4+ 4+ 4   Ankle dorsiflexion 4+ 4+ 5 5 4  bilateral   Ankle plantarflexion       Ankle inversion       Ankle eversion        (Blank rows = not tested)   FUNCTIONAL TESTS:  5 times sit to stand: 25.08 sec  04/20/24  13.35 sec   07/15/24 TUG 12.13 sec  07/27/24:  5 x STS: 22.5  seconds   Lifting: excessive trunk flexion   06/24/24 Negative hip scour R Negaive pain with hip PROM Negative pain with standing weight shift onto R LE No pain with MMT of hip and knee, but pain with active hip ABDuction in S/L Pain with ambulation   [facility] Adult PT Treatment:                                                DATE: 08/25/24 Therapeutic Exercise: *** Manual Therapy: *** Neuromuscular re-ed: *** Therapeutic Activity: *** Gait: *** Modalities: *** Self  Care: [facility] Adult PT Treatment:                                                DATE: 08/17/24 Therapeutic Exercise: Figure 4 stretch 2 x 30 sec bilat  Neuromuscular re-ed: Standing PWR! Exercises - CGA for safety PWR! Up PWR! Step PWR! [employer]! Twist Step and reach laterally Step and reach A/P - min A for balance Supine PWR! Exercises PWR! twist PWR! step Therapeutic Activity: Seated reach to floor with focus on improving hip hinge movement Standing hip hinge with cuing for form   [facility] Adult PT Treatment:                                                DATE: 08/10/24 Therapeutic Exercise: Sidelying Hip hike and depression x 10 reps Supine Isometric hip engagement pressing into a ball x 5 seconds x 5 reps R and L  Attempted bridge-- but creates grabbing sensation in low back so discontinued Neuromuscular re-ed: PWR! Exercises Standing pwr up Standing pwr weight shift Standing pwr step Standing pwr twist X 6 reps each R and L sides With SBA for safety Supine twist x 4 reps Supine large amplitude stepping x 5 reps R And L Therapeutic Activity: Combined large amplitude movements with rock and reach x 5 reps R and L sides with feet in stride position Standing with yoga block isometric squeeze and R reaching to help with postural alignment   [facility] Adult PT Treatment:                                                DATE: 08/04/24  Manual Therapy: STM Rt QL, lumbar paraspinals Trigger Point Dry Needling Subsequent Treatment: Instructions provided previously at initial dry needling treatment.  Patient Verbal Consent Given: Yes Education Handout Provided: Previously Provided Muscles Treated: Rt QL Electrical Stimulation Performed: No Treatment Response/Outcome: twitch response Neuromuscular re-ed: Gait with figure 8 turns Gait with stepping over obstacles Therapeutic Activity: Sit <> stand x 10 Picking up items off of ground with focus on improving form  [facility]  Adult PT Treatment:  DATE: 07/27/24 Therapeutic Exercise: Seated toe raises x 10  HEP review   Therapeutic Activity: Re-assessment to determine overall progress, educating patient on progress towards goals.  Sit to stand x 10  Walking with turning: left and right  Forward and lateral step overs yoga block x 5 each     PATIENT EDUCATION:  Education details: HEP review   Person educated: Patient Education method: Explanation Education comprehension: verbalized understanding  HOME EXERCISE PROGRAM: Access Code: [email or web address] URL: [email or web address] Date: 06/14/2024 Prepared by: Lamarr Price  Exercises - Standing Lumbar Extension with Counter  - 3 x daily - 7 x weekly - 1 sets - 10 reps - Supine Figure 4 Piriformis Stretch  - 1 x daily - 7 x weekly - 3 sets - 10 reps - Supine Lower Trunk Rotation  - 1 x daily - 7 x weekly - 3 sets - 10 reps - QL Stretch Supine  - 1 x daily - 7 x weekly - 3 sets - 10 reps - Seated Isometric Hip Abduction with Belt  - 1 x daily - 7 x weekly - 1-2 sets - 10 reps - 5 sec hold - Wall Push Up  - 1 x daily - 3 x weekly - 2 sets - 10 reps - Tandem Walking with Counter Support  - 1 x daily - 7 x weekly - 3 sets - 10 reps - Heel Walking with Counter Support  - 1 x daily - 7 x weekly - 3 sets - 10 reps - Seated Hip Abduction with Resistance  - 1 x daily - 7 x weekly - 3 sets - 10 reps  ASSESSMENT:  CLINICAL IMPRESSION: ***Pt with good response to PWR! Moves for balance and mobility. She demos good balance with lateral stepping and wt shifts but requires min A with A/P wt shifts and stepping. She is improving tolerance to back stretching and mobility and has less back pain.  EVAL: Patient is a 83 y.o. female who was seen today for physical therapy evaluation and treatment for muscles spasms secondary to her scoliosis and for balance deficits secondary to PD. She also reports new onset of R hip pain 5  days ago. The back spasms and hip pain are limiting her ability to walk, perform sit to stand, and perform standing kitchen and bathroom chores. She denies any falls in the past 6 months, but reports that her balance is not good. She uses a walker intermittently, mainly at the EOD and if she gets up during the night. She has deficits in flexibility and strength as well as posture and balance. She will benefit from skilled PT to address these deficits.  She experienced back spasm when attempting to do a supine hip stretch, so initial trial of DN performed on R QL with good twitch response. Patient reported decreased pain at end of session and was able to move without spasm.  OBJECTIVE IMPAIRMENTS: Abnormal gait, decreased activity tolerance, decreased balance, decreased ROM, decreased strength, increased muscle spasms, impaired flexibility, postural dysfunction, and pain.    GOALS: Goals reviewed with patient? Yes  SHORT TERM GOALS: Target date: 04/28/24 Decreased back spasms by >= 50% to improve QOL 07/27/24: 25% improvement  Goal status: IN PROGRESS 25% 5/6  2.  Decreased hip pain by 50% with steps, transfers and walking. Baseline:  Goal status: MET  3.  Balance assessment completed at second visit and goals updated. Baseline:  Goal status: MET   LONG TERM GOALS: Target date: 09/11/24  Patient able  to perform ADLS without back spasms. Baseline:  Goal status: MET 06/01/24  2.  Pt able to walk and climb stairs without R hip pain. Baseline: 05/11/24: 75% better  07/05/24: 90% better Goal status: MET  3.  Improved 5xSTS by 5 seconds demonstrating improved strength and decreasing risk for falls. Baseline: 25.08 seconds. Goal status: MET 04/20/24 13.35 sec  4.  Improved BERG score by 8 points showing functional improvement Baseline: 43/56 05/14/24: 48/56 06/01/24  47/56 07/22/24  51/56 Goal status: MET 07/22/24  5.  Improve PSFS by 2-3 points  Baseline: see objective Goal status: MET  05/11/24  6.  Patient to demonstrate improved balance and hip strength by being able to enter in/out of her front and back doors safely without LOB. Baseline: 07/27/24: able to step safely without LOB Goal status: MET  7. Patient able to walk in her bathroom and perform toilet transfers with no LOB.  Baseline:  Goal status: MET 07/15/24  8.  Patient able to clear 6 inch obstacles forward and laterally using her SPC or one UE support demonstrating improved SLS time and balance.  Baseline:  07/27/24: able to clear 6 inch with single UE support  Goal status: MET   9.  Decreased TUG score to 11.3 sec with SPC demonstrating decreased risk for falls.  Baseline:  07/07/24: 16 sec 07/15/24: 12.13 sec  07/27/24: 15 sec Goal status: IN PROGRESS   10. Patient will complete 5 x STS in </= 18 seconds to improve functional strength.   Baseline: see above  Goal status: NEW   11. Patient will improve ABC score by at least 15% to improve balance confidence.  Baseline: 18%  Goal Status: NEW  12. Patient will demonstrate proper hip hinge to reduce stress on her back with lifting activity.   Baseline: no knowledge  Goal Status: NEW    PLAN:  PT FREQUENCY: 1x/week  PT DURATION: 6 weeks  PLANNED INTERVENTIONS: [id number]- PT Re-evaluation, 97110-Therapeutic exercises, 97530- Therapeutic activity, [id number]- Neuromuscular re-education, 97535- Self Care, [id number]- Manual therapy, [id number]- Gait training, [phone]- Electrical stimulation (unattended), [phone]- Ionotophoresis 4mg /ml Dexamethasone , Patient/Family education, Balance training, Stair training, Taping, Dry Needling, Joint mobilization, Cryotherapy, and Moist heat  PLAN FOR NEXT SESSION:  Continue figure 8's and narrow spaced areas, backwards walking.  Big stepping activities (dots, over blocks, step ups, agility ladder, etc). Continue standing up and overs to strengthen hips for clearing entryway at home, PRECAUTION: OSTEOPOROSIS. Lifting mechanics. Incorporate  PWR! *give smart phrase for freezing gait tips   Devyn Sheerin, PT 09/09/259:05 PM

## 2024-08-25 ENCOUNTER — Ambulatory Visit: Admitting: Rehabilitative and Restorative Service Providers"

## 2024-08-25 ENCOUNTER — Encounter: Payer: Self-pay | Admitting: Rehabilitative and Restorative Service Providers"

## 2024-08-25 DIAGNOSIS — R2681 Unsteadiness on feet: Secondary | ICD-10-CM

## 2024-08-25 DIAGNOSIS — R2689 Other abnormalities of gait and mobility: Secondary | ICD-10-CM

## 2024-08-27 ENCOUNTER — Ambulatory Visit: Admitting: Physician Assistant

## 2024-08-27 DIAGNOSIS — F419 Anxiety disorder, unspecified: Secondary | ICD-10-CM

## 2024-08-27 DIAGNOSIS — I10 Essential (primary) hypertension: Secondary | ICD-10-CM

## 2024-08-27 DIAGNOSIS — M810 Age-related osteoporosis without current pathological fracture: Secondary | ICD-10-CM

## 2024-08-27 DIAGNOSIS — F339 Major depressive disorder, recurrent, unspecified: Secondary | ICD-10-CM

## 2024-08-27 DIAGNOSIS — F5101 Primary insomnia: Secondary | ICD-10-CM

## 2024-09-01 ENCOUNTER — Ambulatory Visit: Admitting: Physical Therapy

## 2024-09-01 ENCOUNTER — Encounter: Payer: Self-pay | Admitting: Physical Therapy

## 2024-09-01 DIAGNOSIS — R2681 Unsteadiness on feet: Secondary | ICD-10-CM

## 2024-09-01 DIAGNOSIS — R2689 Other abnormalities of gait and mobility: Secondary | ICD-10-CM

## 2024-09-01 DIAGNOSIS — M5459 Other low back pain: Secondary | ICD-10-CM

## 2024-09-01 NOTE — Therapy (Signed)
 OUTPATIENT PHYSICAL THERAPY LUMBAR AND LE TREATMENT   Patient Name: Chelsey Estes MRN: [id number] DOB:1941-11-24, 83 y.o., female Today's Date: 09/01/2024  END OF SESSION:  PT End of Session - 09/01/24 1144     Visit Number 32    Number of Visits 33    Date for PT Re-Evaluation 09/11/24    Authorization Type MCR    PT Start Time 1100    PT Stop Time 1142    PT Time Calculation (min) 42 min    Activity Tolerance Patient tolerated treatment well    Behavior During Therapy WFL for tasks assessed/performed           Past Medical History:  Diagnosis Date   Anxiety    Arthritis    Depression    Glaucoma    History of kidney stones    7 lithrotripsy   Hyperlipidemia    Hypertension    Parkinson disease (HCC)    Past Surgical History:  Procedure Laterality Date   ABDOMINAL HYSTERECTOMY     ANTERIOR (CYSTOCELE) AND POSTERIOR REPAIR (RECTOCELE) WITH XENFORM GRAFT AND SACROSPINOUS FIXATION     CATARACT EXTRACTION, BILATERAL     INGUINAL HERNIA REPAIR Left    kidney stones     LUMBAR LAMINECTOMY/DECOMPRESSION MICRODISCECTOMY Right 10/12/2021   Procedure: Right Lumbar four-five Laminectomy for facet/synovial cyst;  Surgeon: Joshua Alm RAMAN, MD;  Location: [facility] OR;  Service: Neurosurgery;  Laterality: Right;   Patient Active Problem List   Diagnosis Date Noted   Dysuria 02/25/2024   Leukocytes in urine 02/25/2024   Pruritus 10/17/2023   Heart palpitations 07/14/2023   History of hypokalemia 07/14/2023   Vitamin D  deficiency 12/02/2022   Kidney stone 11/22/2022   Tinnitus of left ear 07/03/2022   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 07/03/2022   Decreased GFR 04/17/2022   Medication management 04/16/2022   Synovial cyst of lumbar facet joint 09/19/2021   DDD (degenerative disc disease), lumbar 04/11/2021   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 03/09/2021   Palpitations 09/05/2020   SOB (shortness of breath)  09/05/2020   Chest pain 09/05/2020   Mixed hyperlipidemia 06/06/2020   Primary insomnia 06/06/2020   Primary parkinsonism (HCC) 10/26/2019   Osteoporosis 09/15/2019   Blister of great toe of left foot 08/19/2019   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   Moderate episode of recurrent major depressive disorder (HCC) 02/09/2019   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 02/09/2019   Shuffling gait 02/09/2019   Balance problems 02/09/2019   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   Orthostatic hypotension 02/01/2019   Dizziness 02/01/2019   Primary osteoarthritis of right foot 09/25/2018   Snoring 09/25/2018   Night terrors, adult 09/25/2018   Multiple falls 09/25/2018   Metatarsalgia of left foot 08/20/2018   Chronic foot pain, right 08/20/2018   Left hip pain 04/10/2017   History of shingles 04/08/2017   Adenomatous polyp 04/08/2017   Pure hypercholesterolemia 04/08/2017   History of kidney stones 04/08/2017   Essential hypertension 04/08/2017   Depression, recurrent (HCC) 04/08/2017   Anxiety 04/08/2017   Greater trochanteric bursitis of left hip 04/08/2017    PCP: Antoniette Vermell CROME, PA-C REFERRING PROVIDER: Evonnie Asberry RAMAN, DO REFERRING DIAG:  G20.A1 (ICD-10-CM) - Parkinson's disease without dyskinesia or fluctuating manifestations (HCC)  M41.9 (ICD-10-CM) - Scoliosis, unspecified scoliosis type, unspecified spinal region    THERAPY DIAG:  Unsteadiness on feet  Other abnormalities of gait and mobility  Other low back pain  Rationale for Evaluation and Treatment: Rehabilitation  ONSET DATE: one year  SUBJECTIVE:   SUBJECTIVE STATEMENT:  The patient reports yesterday was a bad day but today is better. She was not able to walk without her walker yesterday but today is able to use her cane  PERTINENT HISTORY: Osteoporosis (cannot take meds), PD, R L 4/5 laminectomy, HTN, scoliosis, h/o falls PAIN:  Are you having pain? Yes: NPRS scale: mild due to frequent bending Pain  location: R lumbar Pain description: stabbing spasm Aggravating factors: loading dishwasher, standing in bathroom, repetitive bending Relieving factors: sitting  PRECAUTIONS: Fall and Other: OSTEOPOROSIS  WEIGHT BEARING RESTRICTIONS: No  FALLS:  Has patient fallen in last 6 months? No  LIVING ENVIRONMENT: Lives with: lives with their family and lives alone Lives in: House/apartment Stairs: Yes: External: 7 steps; can reach both Has following equipment at home: Retail banker - 2 wheeled  PLOF: Independent  PATIENT GOALS: get rid of pain, walk better, improve balance  NEXT MD VISIT: October  OBJECTIVE:  Note: Objective measures were completed at Evaluation unless otherwise noted.  DIAGNOSTIC FINDINGS: none  PATIENT SURVEYS:  The Patient-Specific Functional Scale  Initial:  I am going to ask you to identify up to 3 important activities that you are unable to do or are having difficulty with as a result of this problem.  Today are there any activities that you are unable to do or having difficulty with because of this?  (Patient shown scale and patient rated each activity)  Follow up: When you first came in you had difficulty performing these activities.  Today do you still have difficulty?  Patient-Specific activity scoring scheme (Point to one number):  0 1 2 3 4 5 6 7 8 9  10 Unable                                                                                                          Able to perform To perform                                                                                                    activity at the same Activity         Level as before  Injury or problem  Activity       Cooking/kitchen chores                                                                          Initial:      0                 follow up: 8 on  5/27  2.         Walking                                                                      Initial:         4              follow up: 7 on 5/27  3.         Sit to stand                                                                    Initial:        4               follow up: 7 on 5/27    07/27/24: ABC score 18%   COGNITION: Overall cognitive status: Within functional limits for tasks assessed     SENSATION: Not tested  MUSCLE LENGTH: B piriformis and HS tightness, R QL and lumbar  POSTURE: rounded shoulders, forward head, flexed trunk , and left thoracic R lumbar scoliosis (convexity)  PALPATION: Marked pain R QL, and R gluteals near greater trochanter  LUMBAR ROM:   Active  A/PROM  eval  Flexion WFL  Extension WFL  Right lateral flexion   Left lateral flexion   Right rotation NT  Left rotation NT   (Blank rows = not tested)   LOWER EXTREMITY ROM: WFL for tasks assessed  Active ROM Right eval Left eval  Hip flexion    Hip extension    Hip abduction    Hip adduction    Hip internal rotation 15* 20  Hip external rotation    Knee flexion    Knee extension    Ankle dorsiflexion    Ankle plantarflexion    Ankle inversion    Ankle eversion     (Blank rows = not tested) * cramping  LOWER EXTREMITY MMT: tested in sitting  MMT Right eval Left eval Right 6/17 Left  6/17 07/27/24  Hip flexion 4 4 4 4    Hip extension       Hip abduction 4 4+ 5 in sitting 5  in sitting   Hip adduction 5 5     Hip internal rotation       Hip external rotation  Knee flexion   5 4+   Knee extension 4+ 4+ 4+ 4   Ankle dorsiflexion 4+ 4+ 5 5 4  bilateral   Ankle plantarflexion       Ankle inversion       Ankle eversion        (Blank rows = not tested)   FUNCTIONAL TESTS:  5 times sit to stand: 25.08 sec  04/20/24  13.35 sec   07/15/24 TUG 12.13 sec  07/27/24:  5 x STS: 22.5 seconds   Lifting: excessive trunk flexion   06/24/24 Negative hip scour R Negaive pain  with hip PROM Negative pain with standing weight shift onto R LE No pain with MMT of hip and knee, but pain with active hip ABDuction in S/L Pain with ambulation   [facility] Adult PT Treatment:                                                DATE: 09/01/24  Neuromuscular re-ed: Standing PWR - x 8 each PWR Up PWR step PWR twist PWR wt shift Seated PWR x 8 each PWR up PWR step PWR twist Therapeutic Activity: Simulated unloading dishwasher Gait: Gait fwd/bkwd with focus on change in directions Gait with speed changes Gait ladder for stepping over obstacles and increasing step length and fluidity555   [facility] Adult PT Treatment:                                                DATE: 08/25/24 Neuromuscular re-ed: Seated PWR PWR! Up PWR! Twist pWR! Weight shift X 8 reps each with demo and verbal cues Standing PWR PWR up PWR twist PWR step PWR weight shift X 8 reps R and L  with demo and chair back for intermittent support. Feet in stride with rock and reach R and L sides with visual cue for reaching Therapeutic Activity: Functional training loading/unloading dishwasher working on stacking cones on yoga blocks to emphasize hip hinge position Standing>partial sit to hip hinge x 10 reps with cues for spinal positioning Gait: Dynamic gait activities using bilateral canes to encourage arm swing Marching with CGA Backwards walking with CGA 20 feet x 3 reps Self Care: Discussed current HEP-- patient is not doing seated band hip abduction--- we discussed wanting to add PWR! Moves (standing) to program, but may want to switch to 2x/week prior medbridge HEP and 2x/week 4 standing PWR! moves  [facility] Adult PT Treatment:                                                DATE: 08/17/24 Therapeutic Exercise: Figure 4 stretch 2 x 30 sec bilat  Neuromuscular re-ed: Standing PWR! Exercises - CGA for safety PWR! Up PWR! Step PWR! [employer]! Twist Step and reach laterally Step and reach A/P - min A for  balance Supine PWR! Exercises PWR! twist PWR! step Therapeutic Activity: Seated reach to floor with focus on improving hip hinge movement Standing hip hinge with cuing for form   [facility] Adult PT Treatment:  DATE: 08/10/24 Therapeutic Exercise: Sidelying Hip hike and depression x 10 reps Supine Isometric hip engagement pressing into a ball x 5 seconds x 5 reps R and L  Attempted bridge-- but creates grabbing sensation in low back so discontinued Neuromuscular re-ed: PWR! Exercises Standing pwr up Standing pwr weight shift Standing pwr step Standing pwr twist X 6 reps each R and L sides With SBA for safety Supine twist x 4 reps Supine large amplitude stepping x 5 reps R And L Therapeutic Activity: Combined large amplitude movements with rock and reach x 5 reps R and L sides with feet in stride position Standing with yoga block isometric squeeze and R reaching to help with postural alignment   [facility] Adult PT Treatment:                                                DATE: 08/04/24  Manual Therapy: STM Rt QL, lumbar paraspinals Trigger Point Dry Needling Subsequent Treatment: Instructions provided previously at initial dry needling treatment.  Patient Verbal Consent Given: Yes Education Handout Provided: Previously Provided Muscles Treated: Rt QL Electrical Stimulation Performed: No Treatment Response/Outcome: twitch response Neuromuscular re-ed: Gait with figure 8 turns Gait with stepping over obstacles Therapeutic Activity: Sit <> stand x 10 Picking up items off of ground with focus on improving form      PATIENT EDUCATION:  Education details: HEP review   Person educated: Patient Education method: Explanation Education comprehension: verbalized understanding  HOME EXERCISE PROGRAM: Access Code: [email or web address] URL: [email or web address] Date: 06/14/2024 Prepared by: Lamarr Price  Exercises - Standing  Lumbar Extension with Counter  - 3 x daily - 7 x weekly - 1 sets - 10 reps - Supine Figure 4 Piriformis Stretch  - 1 x daily - 7 x weekly - 3 sets - 10 reps - Supine Lower Trunk Rotation  - 1 x daily - 7 x weekly - 3 sets - 10 reps - QL Stretch Supine  - 1 x daily - 7 x weekly - 3 sets - 10 reps - Seated Isometric Hip Abduction with Belt  - 1 x daily - 7 x weekly - 1-2 sets - 10 reps - 5 sec hold - Wall Push Up  - 1 x daily - 3 x weekly - 2 sets - 10 reps - Tandem Walking with Counter Support  - 1 x daily - 7 x weekly - 3 sets - 10 reps - Heel Walking with Counter Support  - 1 x daily - 7 x weekly - 3 sets - 10 reps - Seated Hip Abduction with Resistance  - 1 x daily - 7 x weekly - 3 sets - 10 reps  Tips to reduce freezing episodes with standing or walking:  Stand tall with your feet wide, so that you can rock and weight shift through your hips. Don't try to fight the freeze: if you begin taking slower, faster, smaller steps, STOP, get your posture tall, and RESET your posture and balance.  Take a deep breath before taking the BIG step to start again. March in place, with high knee stepping, to get started walking again. Use auditory cues:  Count out loud, think of a familiar tune or song or cadence, use pocket metronome, to use rhythm to get started walking again. Use visual cues:  Use a line to step over, use laser pointer  line to step over, (using BIG steps) to start walking again. Use visual targets to keep your posture tall (look ahead and focus on an object or target at eye level). As you approach where your destination with walking, count your steps out loud and/or focus on your target with your eyes until you are fully there. Use appropriate assistive device, as advised by your physical therapist to assist with taking longer, consistent steps.   ASSESSMENT:  CLINICAL IMPRESSION: Pt has made great improvements in functional squats, much improved performance with hip hinge. Pt with some  difficulty with speed and direction changes during gait. PT educated pt on tips to reduce freezing with pt able to verbalize understanding  EVAL: Patient is a 83 y.o. female who was seen today for physical therapy evaluation and treatment for muscles spasms secondary to her scoliosis and for balance deficits secondary to PD. She also reports new onset of R hip pain 5 days ago. The back spasms and hip pain are limiting her ability to walk, perform sit to stand, and perform standing kitchen and bathroom chores. She denies any falls in the past 6 months, but reports that her balance is not good. She uses a walker intermittently, mainly at the EOD and if she gets up during the night. She has deficits in flexibility and strength as well as posture and balance. She will benefit from skilled PT to address these deficits.  She experienced back spasm when attempting to do a supine hip stretch, so initial trial of DN performed on R QL with good twitch response. Patient reported decreased pain at end of session and was able to move without spasm.  OBJECTIVE IMPAIRMENTS: Abnormal gait, decreased activity tolerance, decreased balance, decreased ROM, decreased strength, increased muscle spasms, impaired flexibility, postural dysfunction, and pain.    GOALS: Goals reviewed with patient? Yes  SHORT TERM GOALS: Target date: 04/28/24 Decreased back spasms by >= 50% to improve QOL 07/27/24: 25% improvement  Goal status: IN PROGRESS 25% 5/6  2.  Decreased hip pain by 50% with steps, transfers and walking. Baseline:  Goal status: MET  3.  Balance assessment completed at second visit and goals updated. Baseline:  Goal status: MET  LONG TERM GOALS: Target date: 09/11/24  Patient able to perform ADLS without back spasms. Baseline:  Goal status: MET 06/01/24  2.  Pt able to walk and climb stairs without R hip pain. Baseline: 05/11/24: 75% better  07/05/24: 90% better Goal status: MET  3.  Improved 5xSTS by 5  seconds demonstrating improved strength and decreasing risk for falls. Baseline: 25.08 seconds. Goal status: MET 04/20/24 13.35 sec  4.  Improved BERG score by 8 points showing functional improvement Baseline: 43/56 05/14/24: 48/56 06/01/24  47/56 07/22/24  51/56 Goal status: MET 07/22/24  5.  Improve PSFS by 2-3 points  Baseline: see objective Goal status: MET 05/11/24  6.  Patient to demonstrate improved balance and hip strength by being able to enter in/out of her front and back doors safely without LOB. Baseline: 07/27/24: able to step safely without LOB Goal status: MET  7. Patient able to walk in her bathroom and perform toilet transfers with no LOB.  Baseline:  Goal status: MET 07/15/24  8.  Patient able to clear 6 inch obstacles forward and laterally using her SPC or one UE support demonstrating improved SLS time and balance.  Baseline:  07/27/24: able to clear 6 inch with single UE support  Goal status: MET   9.  Decreased TUG score to 11.3 sec with SPC demonstrating decreased risk for falls.  Baseline:  07/07/24: 16 sec 07/15/24: 12.13 sec  07/27/24: 15 sec Goal status: IN PROGRESS   10. Patient will complete 5 x STS in </= 18 seconds to improve functional strength.   Baseline: see above  Goal status: NEW   11. Patient will improve ABC score by at least 15% to improve balance confidence.  Baseline: 18%  Goal Status: NEW  12. Patient will demonstrate proper hip hinge to reduce stress on her back with lifting activity.   Baseline: no knowledge  Goal Status: NEW    PLAN:  PT FREQUENCY: 1x/week  PT DURATION: 6 weeks  PLANNED INTERVENTIONS: [id number]- PT Re-evaluation, 97110-Therapeutic exercises, 97530- Therapeutic activity, [id number]- Neuromuscular re-education, 97535- Self Care, [id number]- Manual therapy, [phone]- Gait training, [phone]- Electrical stimulation (unattended), [phone]- Ionotophoresis 4mg /ml Dexamethasone , Patient/Family education, Balance training, Stair training, Taping,  Dry Needling, Joint mobilization, Cryotherapy, and Moist heat  PLAN FOR NEXT SESSION:  Continue figure 8's and narrow spaced areas, backwards walking.  Big stepping activities (dots, over blocks, step ups, agility ladder, etc). Continue standing up and overs to strengthen hips for clearing entryway at home, PRECAUTION: OSTEOPOROSIS. Lifting mechanics. Incorporate PWR!    Elouise Divelbiss, PT 09/01/2510:45 AM

## 2024-09-08 ENCOUNTER — Encounter: Payer: Self-pay | Admitting: Physical Therapy

## 2024-09-08 ENCOUNTER — Ambulatory Visit: Admitting: Physical Therapy

## 2024-09-08 DIAGNOSIS — R2681 Unsteadiness on feet: Secondary | ICD-10-CM | POA: Diagnosis not present

## 2024-09-08 DIAGNOSIS — R2689 Other abnormalities of gait and mobility: Secondary | ICD-10-CM

## 2024-09-08 DIAGNOSIS — M5459 Other low back pain: Secondary | ICD-10-CM

## 2024-09-08 NOTE — Therapy (Signed)
 OUTPATIENT PHYSICAL THERAPY LUMBAR AND LE TREATMENT AND D/C  Patient Name: Chelsey Estes MRN: [id number] DOB:Feb 24, 1941, 83 y.o., female Today's Date: 09/08/2024  END OF SESSION:  PT End of Session - 09/08/24 1145     Visit Number 33    Number of Visits 33    PT Start Time 1100    PT Stop Time 1144    PT Time Calculation (min) 44 min    Activity Tolerance Patient tolerated treatment well    Behavior During Therapy WFL for tasks assessed/performed            Past Medical History:  Diagnosis Date   Anxiety    Arthritis    Depression    Glaucoma    History of kidney stones    7 lithrotripsy   Hyperlipidemia    Hypertension    Parkinson disease (HCC)    Past Surgical History:  Procedure Laterality Date   ABDOMINAL HYSTERECTOMY     ANTERIOR (CYSTOCELE) AND POSTERIOR REPAIR (RECTOCELE) WITH XENFORM GRAFT AND SACROSPINOUS FIXATION     CATARACT EXTRACTION, BILATERAL     INGUINAL HERNIA REPAIR Left    kidney stones     LUMBAR LAMINECTOMY/DECOMPRESSION MICRODISCECTOMY Right 10/12/2021   Procedure: Right Lumbar four-five Laminectomy for facet/synovial cyst;  Surgeon: Joshua Alm RAMAN, MD;  Location: [facility] OR;  Service: Neurosurgery;  Laterality: Right;   Patient Active Problem List   Diagnosis Date Noted   Dysuria 02/25/2024   Leukocytes in urine 02/25/2024   Pruritus 10/17/2023   Heart palpitations 07/14/2023   History of hypokalemia 07/14/2023   Vitamin D  deficiency 12/02/2022   Kidney stone 11/22/2022   Tinnitus of left ear 07/03/2022   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 07/03/2022   Decreased GFR 04/17/2022   Medication management 04/16/2022   Synovial cyst of lumbar facet joint 09/19/2021   DDD (degenerative disc disease), lumbar 04/11/2021   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 03/09/2021   Palpitations 09/05/2020   SOB (shortness of breath) 09/05/2020   Chest pain 09/05/2020   Mixed hyperlipidemia  06/06/2020   Primary insomnia 06/06/2020   Primary parkinsonism (HCC) 10/26/2019   Osteoporosis 09/15/2019   Blister of great toe of left foot 08/19/2019   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   Moderate episode of recurrent major depressive disorder (HCC) 02/09/2019   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 02/09/2019   Shuffling gait 02/09/2019   Balance problems 02/09/2019   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   Orthostatic hypotension 02/01/2019   Dizziness 02/01/2019   Primary osteoarthritis of right foot 09/25/2018   Snoring 09/25/2018   Night terrors, adult 09/25/2018   Multiple falls 09/25/2018   Metatarsalgia of left foot 08/20/2018   Chronic foot pain, right 08/20/2018   Left hip pain 04/10/2017   History of shingles 04/08/2017   Adenomatous polyp 04/08/2017   Pure hypercholesterolemia 04/08/2017   History of kidney stones 04/08/2017   Essential hypertension 04/08/2017   Depression, recurrent 04/08/2017   Anxiety 04/08/2017   Greater trochanteric bursitis of left hip 04/08/2017    PCP: Antoniette Vermell CROME, PA-C REFERRING PROVIDER: Evonnie Asberry RAMAN, DO REFERRING DIAG:  G20.A1 (ICD-10-CM) - Parkinson's disease without dyskinesia or fluctuating manifestations (HCC)  M41.9 (ICD-10-CM) - Scoliosis, unspecified scoliosis type, unspecified spinal region    THERAPY DIAG:  Unsteadiness on feet  Other abnormalities of gait and mobility  Other low back pain  Rationale for Evaluation and Treatment: Rehabilitation  ONSET DATE: one year  SUBJECTIVE:  SUBJECTIVE STATEMENT:  The patient states she feels a little off today. She is aware today is her last visit  PERTINENT HISTORY: Osteoporosis (cannot take meds), PD, R L 4/5 laminectomy, HTN, scoliosis, h/o falls PAIN:  Are you having pain? Yes: NPRS scale: mild due to frequent bending Pain location: R lumbar Pain description: stabbing spasm Aggravating factors: loading dishwasher, standing in bathroom, repetitive  bending Relieving factors: sitting  PRECAUTIONS: Fall and Other: OSTEOPOROSIS  WEIGHT BEARING RESTRICTIONS: No  FALLS:  Has patient fallen in last 6 months? No  LIVING ENVIRONMENT: Lives with: lives with their family and lives alone Lives in: House/apartment Stairs: Yes: External: 7 steps; can reach both Has following equipment at home: Retail banker - 2 wheeled  PLOF: Independent  PATIENT GOALS: get rid of pain, walk better, improve balance  NEXT MD VISIT: October  OBJECTIVE:  Note: Objective measures were completed at Evaluation unless otherwise noted.  DIAGNOSTIC FINDINGS: none  PATIENT SURVEYS:  The Patient-Specific Functional Scale  Initial:  I am going to ask you to identify up to 3 important activities that you are unable to do or are having difficulty with as a result of this problem.  Today are there any activities that you are unable to do or having difficulty with because of this?  (Patient shown scale and patient rated each activity)  Follow up: When you first came in you had difficulty performing these activities.  Today do you still have difficulty?  Patient-Specific activity scoring scheme (Point to one number):  0 1 2 3 4 5 6 7 8 9  10 Unable                                                                                                          Able to perform To perform                                                                                                    activity at the same Activity         Level as before  Injury or problem  Activity       Cooking/kitchen chores                                                                          Initial:      0                 follow up: 8 on 5/27  2.         Walking                                                                      Initial:         4               follow up: 7 on 5/27  3.         Sit to stand                                                                    Initial:        4               follow up: 7 on 5/27    07/27/24: ABC score 18%   COGNITION: Overall cognitive status: Within functional limits for tasks assessed     SENSATION: Not tested  MUSCLE LENGTH: B piriformis and HS tightness, R QL and lumbar  POSTURE: rounded shoulders, forward head, flexed trunk , and left thoracic R lumbar scoliosis (convexity)  PALPATION: Marked pain R QL, and R gluteals near greater trochanter  LUMBAR ROM:   Active  A/PROM  eval  Flexion WFL  Extension WFL  Right lateral flexion   Left lateral flexion   Right rotation NT  Left rotation NT   (Blank rows = not tested)   LOWER EXTREMITY ROM: WFL for tasks assessed  Active ROM Right eval Left eval  Hip flexion    Hip extension    Hip abduction    Hip adduction    Hip internal rotation 15* 20  Hip external rotation    Knee flexion    Knee extension    Ankle dorsiflexion    Ankle plantarflexion    Ankle inversion    Ankle eversion     (Blank rows = not tested) * cramping  LOWER EXTREMITY MMT: tested in sitting  MMT Right eval Left eval Right 6/17 Left  6/17 07/27/24  Hip flexion 4 4 4 4    Hip extension       Hip abduction 4 4+ 5 in sitting 5  in sitting   Hip adduction 5 5     Hip internal rotation       Hip external rotation  Knee flexion   5 4+   Knee extension 4+ 4+ 4+ 4   Ankle dorsiflexion 4+ 4+ 5 5 4  bilateral   Ankle plantarflexion       Ankle inversion       Ankle eversion        (Blank rows = not tested)   FUNCTIONAL TESTS:  5 times sit to stand: 25.08 sec  04/20/24  13.35 sec   07/15/24 TUG 12.13 sec  07/27/24:  5 x STS: 22.5 seconds   09/08/24:  5x STS: 13.97 seconds TUG (with SPC): 21.22 seconds  Lifting: excessive trunk flexion   06/24/24 Negative hip scour R Negaive pain with hip PROM Negative pain with standing weight shift onto  R LE No pain with MMT of hip and knee, but pain with active hip ABDuction in S/L Pain with ambulation   [facility] Adult PT Treatment:                                                DATE: 09/08/24  Therapeutic Activity: 5 x STS = 13.97 seconds TUG 21.22 seconds 10 meter walk: 19.07 ABC scale: 33%  Self Care: Discussion of updated PT POC, safety for home, recommendation for PT screen in 4-5 months  [facility] Adult PT Treatment:                                                DATE: 09/01/24  Neuromuscular re-ed: Standing PWR - x 8 each PWR Up PWR step PWR twist PWR wt shift Seated PWR x 8 each PWR up PWR step PWR twist Therapeutic Activity: Simulated unloading dishwasher Gait: Gait fwd/bkwd with focus on change in directions Gait with speed changes Gait ladder for stepping over obstacles and increasing step length and fluidity555   [facility] Adult PT Treatment:                                                DATE: 08/25/24 Neuromuscular re-ed: Seated PWR PWR! Up PWR! Twist pWR! Weight shift X 8 reps each with demo and verbal cues Standing PWR PWR up PWR twist PWR step PWR weight shift X 8 reps R and L  with demo and chair back for intermittent support. Feet in stride with rock and reach R and L sides with visual cue for reaching Therapeutic Activity: Functional training loading/unloading dishwasher working on stacking cones on yoga blocks to emphasize hip hinge position Standing>partial sit to hip hinge x 10 reps with cues for spinal positioning Gait: Dynamic gait activities using bilateral canes to encourage arm swing Marching with CGA Backwards walking with CGA 20 feet x 3 reps Self Care: Discussed current HEP-- patient is not doing seated band hip abduction--- we discussed wanting to add PWR! Moves (standing) to program, but may want to switch to 2x/week prior medbridge HEP and 2x/week 4 standing PWR! moves  [facility] Adult PT Treatment:  DATE: 08/17/24 Therapeutic Exercise: Figure 4 stretch 2 x 30 sec bilat  Neuromuscular re-ed: Standing PWR! Exercises - CGA for safety PWR! Up PWR! Step PWR! [employer]! Twist Step and reach laterally Step and reach A/P - min A for balance Supine PWR! Exercises PWR! twist PWR! step Therapeutic Activity: Seated reach to floor with focus on improving hip hinge movement Standing hip hinge with cuing for form   [facility] Adult PT Treatment:                                                DATE: 08/10/24 Therapeutic Exercise: Sidelying Hip hike and depression x 10 reps Supine Isometric hip engagement pressing into a ball x 5 seconds x 5 reps R and L  Attempted bridge-- but creates grabbing sensation in low back so discontinued Neuromuscular re-ed: PWR! Exercises Standing pwr up Standing pwr weight shift Standing pwr step Standing pwr twist X 6 reps each R and L sides With SBA for safety Supine twist x 4 reps Supine large amplitude stepping x 5 reps R And L Therapeutic Activity: Combined large amplitude movements with rock and reach x 5 reps R and L sides with feet in stride position Standing with yoga block isometric squeeze and R reaching to help with postural alignment     PATIENT EDUCATION:  Education details: HEP review   Person educated: Patient Education method: Explanation Education comprehension: verbalized understanding  HOME EXERCISE PROGRAM: Access Code: [email or web address] URL: [email or web address] Date: 09/08/2024 Prepared by: Darice Conine  Exercises - Supine Lower Trunk Rotation  - 1 x daily - 7 x weekly - 3 sets - 10 reps - Supine Double Knee to Chest  - 1 x daily - 7 x weekly - 1 sets - 3 reps - 20-30 seconds hold - Seated Figure 4 Piriformis Stretch  - 1 x daily - 7 x weekly - 1 sets - 3 reps - 20-30 seconds hold - Wall Push Up  - 1 x daily - 7 x weekly - 2 sets - 10 reps - Tandem Walking with Counter Support  - 1 x daily - 7 x weekly - 3 sets -  10 reps - Heel Walking with Counter Support  - 1 x daily - 7 x weekly - 3 sets - 10 reps  Tips to reduce freezing episodes with standing or walking:  Stand tall with your feet wide, so that you can rock and weight shift through your hips. Don't try to fight the freeze: if you begin taking slower, faster, smaller steps, STOP, get your posture tall, and RESET your posture and balance.  Take a deep breath before taking the BIG step to start again. March in place, with high knee stepping, to get started walking again. Use auditory cues:  Count out loud, think of a familiar tune or song or cadence, use pocket metronome, to use rhythm to get started walking again. Use visual cues:  Use a line to step over, use laser pointer line to step over, (using BIG steps) to start walking again. Use visual targets to keep your posture tall (look ahead and focus on an object or target at eye level). As you approach where your destination with walking, count your steps out loud and/or focus on your target with your eyes until you are fully there. Use appropriate assistive device, as advised by your physical  therapist to assist with taking longer, consistent steps.    ASSESSMENT:  CLINICAL IMPRESSION: Pt has improved strength and gait and has met goals. She continues with impairments in balance. PT recommends d/c at this time and PT screen in 4-5 months due to nature of PD. Pt agreeable to recommendations and plan for d/c  EVAL: Patient is a 83 y.o. female who was seen today for physical therapy evaluation and treatment for muscles spasms secondary to her scoliosis and for balance deficits secondary to PD. She also reports new onset of R hip pain 5 days ago. The back spasms and hip pain are limiting her ability to walk, perform sit to stand, and perform standing kitchen and bathroom chores. She denies any falls in the past 6 months, but reports that her balance is not good. She uses a walker intermittently, mainly  at the EOD and if she gets up during the night. She has deficits in flexibility and strength as well as posture and balance. She will benefit from skilled PT to address these deficits.  She experienced back spasm when attempting to do a supine hip stretch, so initial trial of DN performed on R QL with good twitch response. Patient reported decreased pain at end of session and was able to move without spasm.  OBJECTIVE IMPAIRMENTS: Abnormal gait, decreased activity tolerance, decreased balance, decreased ROM, decreased strength, increased muscle spasms, impaired flexibility, postural dysfunction, and pain.    GOALS: Goals reviewed with patient? Yes  SHORT TERM GOALS: Target date: 04/28/24 Decreased back spasms by >= 50% to improve QOL 07/27/24: 25% improvement  Goal status: IN PROGRESS 25% 5/6  2.  Decreased hip pain by 50% with steps, transfers and walking. Baseline:  Goal status: MET  3.  Balance assessment completed at second visit and goals updated. Baseline:  Goal status: MET  LONG TERM GOALS: Target date: 09/11/24  Patient able to perform ADLS without back spasms. Baseline:  Goal status: MET 06/01/24  2.  Pt able to walk and climb stairs without R hip pain. Baseline: 05/11/24: 75% better  07/05/24: 90% better Goal status: MET  3.  Improved 5xSTS by 5 seconds demonstrating improved strength and decreasing risk for falls. Baseline: 25.08 seconds. Goal status: MET 04/20/24 13.35 sec  4.  Improved BERG score by 8 points showing functional improvement Baseline: 43/56 05/14/24: 48/56 06/01/24  47/56 07/22/24  51/56 Goal status: MET 07/22/24  5.  Improve PSFS by 2-3 points  Baseline: see objective Goal status: MET 05/11/24  6.  Patient to demonstrate improved balance and hip strength by being able to enter in/out of her front and back doors safely without LOB. Baseline: 07/27/24: able to step safely without LOB Goal status: MET  7. Patient able to walk in her bathroom and perform  toilet transfers with no LOB.  Baseline:  Goal status: MET 07/15/24  8.  Patient able to clear 6 inch obstacles forward and laterally using her SPC or one UE support demonstrating improved SLS time and balance.  Baseline:  07/27/24: able to clear 6 inch with single UE support  Goal status: MET   9.  Decreased TUG score to 11.3 sec with SPC demonstrating decreased risk for falls.  Baseline:  07/07/24: 16 sec 07/15/24: 12.13 sec  07/27/24: 15 sec Goal status: IN PROGRESS   10. Patient will complete 5 x STS in </= 18 seconds to improve functional strength.   Baseline: see above  Goal status: MET  11. Patient will improve ABC score  by at least 15% to improve balance confidence.  Baseline: 18%  Goal Status: MET  12. Patient will demonstrate proper hip hinge to reduce stress on her back with lifting activity.   Baseline: no knowledge  Goal Status: MET    PLAN:  PT FREQUENCY: 1x/week  PT DURATION: 6 weeks  PLANNED INTERVENTIONS: 97164- PT Re-evaluation, 97110-Therapeutic exercises, 97530- Therapeutic activity, 97112- Neuromuscular re-education, 97535- Self Care, [id number]- Manual therapy, [phone]- Gait training, [phone]- Electrical stimulation (unattended), [phone]- Ionotophoresis 4mg /ml Dexamethasone , Patient/Family education, Balance training, Stair training, Taping, Dry Needling, Joint mobilization, Cryotherapy, and Moist heat  PLAN FOR NEXT SESSION:  D/C    Octavio Matheney, PT 09/08/2510:46 AM  PHYSICAL THERAPY DISCHARGE SUMMARY  Visits from Start of Care: 33  Current functional level related to goals / functional outcomes: Decreased back pain, improved gait and strength   Remaining deficits: See above   Education / Equipment: HEP   Patient agrees to discharge. Patient goals were partially met. Patient is being discharged due to being pleased with the current functional level.

## 2024-09-20 ENCOUNTER — Other Ambulatory Visit: Payer: Self-pay | Admitting: Physician Assistant

## 2024-09-20 DIAGNOSIS — Z1231 Encounter for screening mammogram for malignant neoplasm of breast: Secondary | ICD-10-CM

## 2024-10-11 ENCOUNTER — Other Ambulatory Visit: Payer: Self-pay | Admitting: Physician Assistant

## 2024-10-11 DIAGNOSIS — F5101 Primary insomnia: Secondary | ICD-10-CM

## 2024-10-13 ENCOUNTER — Ambulatory Visit: Admitting: Neurology

## 2024-10-13 NOTE — Telephone Encounter (Signed)
 BELSOMRA  Last OV: 04/05/24 (w/ Dr. Colette) Next OV: no appointment scheduled Last RF: 04/16/24

## 2024-10-28 ENCOUNTER — Ambulatory Visit

## 2024-11-14 ENCOUNTER — Other Ambulatory Visit: Payer: Self-pay | Admitting: Physician Assistant

## 2024-11-14 DIAGNOSIS — F339 Major depressive disorder, recurrent, unspecified: Secondary | ICD-10-CM

## 2024-11-14 DIAGNOSIS — F419 Anxiety disorder, unspecified: Secondary | ICD-10-CM

## 2024-12-03 ENCOUNTER — Other Ambulatory Visit: Payer: Self-pay | Admitting: Physician Assistant

## 2024-12-03 DIAGNOSIS — I10 Essential (primary) hypertension: Secondary | ICD-10-CM

## 2024-12-13 ENCOUNTER — Encounter: Payer: Self-pay | Admitting: Physician Assistant

## 2025-01-06 ENCOUNTER — Ambulatory Visit: Admitting: Urgent Care

## 2025-01-11 ENCOUNTER — Ambulatory Visit: Admitting: Physician Assistant

## 2025-01-14 NOTE — Progress Notes (Unsigned)
 "  Virtual Visit Via Video       Consent was obtained for video visit:  Yes.   Answered questions that patient had about telehealth interaction:  Yes.   I discussed the limitations, risks, security and privacy concerns of performing an evaluation and management service by telemedicine. I also discussed with the patient that there may be a patient responsible charge related to this service. The patient expressed understanding and agreed to proceed.  Pt location: Home Physician Location: office Name of referring provider:  Antoniette Estes CROME, PA-C I connected with Chelsey Estes at patients initiation/request on 01/18/2025 at  2:30 PM EST by video enabled telemedicine application and verified that I am speaking with the correct person using two identifiers. Pt MRN:  [id number] Pt DOB:  [birth year] Video Participants:  Chelsey Estes;    Assessment/Plan:   1.  Parkinsons Disease  -Continue carbidopa /levodopa  25/100, 1 tablet 3 times per day.  -We discussed that it used to be thought that levodopa  would increase risk of melanoma but now it is believed that Parkinsons itself likely increases risk of melanoma. she is to get regular skin checks. She asks about this today as she had SCC of skin and thought it was from medication but discussed that this would not be the case but needs to follow with derm  2.  Diplopia  -Follows with ophthalmology.  Long history of eye surgeries  3.  GAD/depression  -Doing well.  4.  Parkinsons Disease dyskinesia  -mild.  Discussed amantadine but decided to hold off for now  5.  Low back pain with sacral cyst  -Status post surgical intervention and October, 2022 with Dr. Joshua  6.  Hypertension  -She is doing fairly well with lower dosages of lisinopril , 5 mg daily.  She has had no near syncope.  Primary care is monitoring.  She understands concept of permissive hypertension.  7.  Overactive bladder  - Following with urology at [facility] and bladder Botox is scheduled  for 01/29/25 and she is hoping that will help so she will be able to drink more then and be less dehydrated then  8.f/u 6 months  Subjective:   Chelsey Estes was seen today in follow up for Parkinsons disease.  My previous records were reviewed prior to todays visit as well as outside records available to me.  Patient has not been seen since last April.  She has had 1 fall and it was during PT - the automatic door there was broken and it hit her in the back and she fell.  She didn't get hurt.  Rare lightheadedness when she is dehydrated but no near syncope.  She has a CNA helping her a few days per week.  She has more trouble getting up and down from a chair.  She has a lift chair at home.  She has been following with urology at [facility] for overactive bladder.  She does get bladder Botox scheduled for 2/14  Current prescribed movement disorder medications:  Carbidopa /levodopa  25/100, 1 tablet 3 times per day.   PREVIOUS MEDICATIONS: Sinemet   ALLERGIES:   Allergies  Allergen Reactions   Trazodone  And Nefazodone Nausea Only and Other (See Comments)    Nausea/dizzines/cramps     CURRENT MEDICATIONS:  Outpatient Encounter Medications as of 01/18/2025  Medication Sig   ALPHAGAN  P 0.1 % SOLN Place 1 drop into both eyes in the morning and at bedtime.    BELSOMRA  10 MG TABS TAKE 1 TABLET AT BEDTIME  buPROPion  (WELLBUTRIN  XL) 300 MG 24 hr tablet TAKE 1 TABLET DAILY   carbidopa -levodopa  (SINEMET  IR) 25-100 MG tablet TAKE 1 TABLET BY MOUTH THREE TIMES DAILY   cholecalciferol (VITAMIN D3) 25 MCG (1000 UNIT) tablet Take 1,000 Units by mouth daily.   cyanocobalamin 2000 MCG tablet Take 2,000 mcg by mouth daily.   hydrOXYzine  (ATARAX ) 10 MG tablet Take 1 tablet (10 mg total) by mouth 3 (three) times daily as needed for itching.   ibuprofen (ADVIL) 600 MG tablet Take 600 mg by mouth every 6 (six) hours as needed.   lisinopril  (ZESTRIL ) 5 MG tablet TAKE 2 TABLETS EVERY       MORNING AND 1 TABLET EVERY  EVENING   timolol  (TIMOPTIC ) 0.5 % ophthalmic solution Place 1 drop into both eyes daily.    No facility-administered encounter medications on file as of 01/18/2025.    Objective:   PHYSICAL EXAMINATION:    VITALS:   There were no vitals filed for this visit.     Wt Readings from Last 3 Encounters:  04/05/24 123 lb 9.6 oz (56.1 kg)  03/19/24 122 lb 9.6 oz (55.6 kg)  02/24/24 123 lb (55.8 kg)    GEN:  The patient appears stated age and is in NAD. HEENT:  Normocephalic, atraumatic.  The mucous membranes are moist.  Neurological examination:  Orientation: The patient is alert and oriented x3. Cranial nerves: There is good facial symmetry with mild facial hypomimia. The speech is fluent and clear.  Motor: Strength is at least antigravity x4.  Movement examination: Abnormal movements: there is no dyskinesia or tremor today Coordination:  There is mild decremation with finger taps on the R Gait and Station: unable to test/view on video   I have reviewed and interpreted the following labs independently    Chemistry      Component Value Date/Time   NA 142 02/24/2024 1132   K 4.0 02/24/2024 1132   CL 106 02/24/2024 1132   CO2 21 02/24/2024 1132   BUN 20 02/24/2024 1132   CREATININE 0.94 02/24/2024 1132   CREATININE 1.10 (H) 12/23/2022 1436   GLU 109 09/25/2016 0000      Component Value Date/Time   CALCIUM 9.8 02/24/2024 1132   ALKPHOS 81 02/24/2024 1132   AST 14 02/24/2024 1132   ALT 5 02/24/2024 1132   BILITOT 0.3 02/24/2024 1132       Lab Results  Component Value Date   WBC 7.8 10/17/2023   HGB 12.7 10/17/2023   HCT 39.5 10/17/2023   MCV 93 10/17/2023   PLT 287 10/17/2023    Lab Results  Component Value Date   TSH 0.991 10/17/2023   Follow up Instructions      -I discussed the assessment and treatment plan with the patient. The patient was provided an opportunity to ask questions and all were answered. The patient agreed with the plan and demonstrated  an understanding of the instructions.   The patient was advised to call back or seek an in-person evaluation if the symptoms worsen or if the condition fails to improve as anticipated.     Asberry Schneider, DO    Cc:  Breeback, Jade L, PA-C "

## 2025-01-18 ENCOUNTER — Telehealth: Admitting: Neurology

## 2025-01-18 DIAGNOSIS — Z85828 Personal history of other malignant neoplasm of skin: Secondary | ICD-10-CM | POA: Diagnosis not present

## 2025-01-18 DIAGNOSIS — G20A1 Parkinson's disease without dyskinesia, without mention of fluctuations: Secondary | ICD-10-CM

## 2025-01-18 DIAGNOSIS — G20B1 Parkinson's disease with dyskinesia, without mention of fluctuations: Secondary | ICD-10-CM | POA: Diagnosis not present

## 2025-01-18 DIAGNOSIS — N3281 Overactive bladder: Secondary | ICD-10-CM | POA: Diagnosis not present

## 2025-01-18 MED ORDER — CARBIDOPA-LEVODOPA 25-100 MG PO TABS: 1.0000 | ORAL_TABLET | Freq: Three times a day (TID) | ORAL | 0 refills | Status: AC

## 2025-07-26 ENCOUNTER — Ambulatory Visit: Payer: Self-pay | Admitting: Neurology
